# Patient Record
Sex: Female | Born: 1955 | Race: Black or African American | Hispanic: No | Marital: Single | Smoking: Current every day smoker
Health system: Southern US, Community
[De-identification: ages and names within clinical notes are randomized; demographics above are authoritative.]

## PROBLEM LIST (undated history)

## (undated) DIAGNOSIS — E119 Type 2 diabetes mellitus without complications: Secondary | ICD-10-CM

## (undated) DIAGNOSIS — I2510 Atherosclerotic heart disease of native coronary artery without angina pectoris: Secondary | ICD-10-CM

## (undated) DIAGNOSIS — I10 Essential (primary) hypertension: Secondary | ICD-10-CM

## (undated) DIAGNOSIS — E785 Hyperlipidemia, unspecified: Secondary | ICD-10-CM

## (undated) DIAGNOSIS — F319 Bipolar disorder, unspecified: Secondary | ICD-10-CM

## (undated) HISTORY — PX: COLONOSCOPY: SHX174

## (undated) HISTORY — PX: ESOPHAGOGASTRODUODENOSCOPY: SHX1529

---

## 2016-01-22 DIAGNOSIS — J449 Chronic obstructive pulmonary disease, unspecified: Secondary | ICD-10-CM | POA: Insufficient documentation

## 2016-04-22 DIAGNOSIS — I252 Old myocardial infarction: Secondary | ICD-10-CM | POA: Insufficient documentation

## 2016-04-22 DIAGNOSIS — F319 Bipolar disorder, unspecified: Secondary | ICD-10-CM | POA: Insufficient documentation

## 2016-04-22 DIAGNOSIS — I214 Non-ST elevation (NSTEMI) myocardial infarction: Secondary | ICD-10-CM | POA: Insufficient documentation

## 2018-02-17 DIAGNOSIS — F1411 Cocaine abuse, in remission: Secondary | ICD-10-CM | POA: Insufficient documentation

## 2020-02-25 ENCOUNTER — Other Ambulatory Visit: Payer: Self-pay

## 2020-02-25 ENCOUNTER — Emergency Department: Payer: 59

## 2020-02-25 ENCOUNTER — Inpatient Hospital Stay
Admission: EM | Admit: 2020-02-25 | Discharge: 2020-02-27 | DRG: 638 | Disposition: A | Discharge status: Home / Self Care | Payer: 59 | Attending: Obstetrics and Gynecology | Admitting: Obstetrics and Gynecology

## 2020-02-25 DIAGNOSIS — F319 Bipolar disorder, unspecified: Secondary | ICD-10-CM | POA: Diagnosis present

## 2020-02-25 DIAGNOSIS — Z794 Long term (current) use of insulin: Secondary | ICD-10-CM

## 2020-02-25 DIAGNOSIS — E43 Unspecified severe protein-calorie malnutrition: Secondary | ICD-10-CM | POA: Diagnosis present

## 2020-02-25 DIAGNOSIS — E782 Mixed hyperlipidemia: Secondary | ICD-10-CM

## 2020-02-25 DIAGNOSIS — E440 Moderate protein-calorie malnutrition: Secondary | ICD-10-CM | POA: Diagnosis not present

## 2020-02-25 DIAGNOSIS — Z66 Do not resuscitate: Secondary | ICD-10-CM | POA: Diagnosis present

## 2020-02-25 DIAGNOSIS — E1010 Type 1 diabetes mellitus with ketoacidosis without coma: Principal | ICD-10-CM | POA: Diagnosis present

## 2020-02-25 DIAGNOSIS — N179 Acute kidney failure, unspecified: Secondary | ICD-10-CM | POA: Diagnosis present

## 2020-02-25 DIAGNOSIS — M7989 Other specified soft tissue disorders: Secondary | ICD-10-CM | POA: Diagnosis present

## 2020-02-25 DIAGNOSIS — E1110 Type 2 diabetes mellitus with ketoacidosis without coma: Secondary | ICD-10-CM | POA: Diagnosis present

## 2020-02-25 DIAGNOSIS — F1490 Cocaine use, unspecified, uncomplicated: Secondary | ICD-10-CM | POA: Diagnosis present

## 2020-02-25 DIAGNOSIS — Z20822 Contact with and (suspected) exposure to covid-19: Secondary | ICD-10-CM | POA: Diagnosis present

## 2020-02-25 DIAGNOSIS — Z9114 Patient's other noncompliance with medication regimen: Secondary | ICD-10-CM

## 2020-02-25 DIAGNOSIS — K219 Gastro-esophageal reflux disease without esophagitis: Secondary | ICD-10-CM | POA: Diagnosis present

## 2020-02-25 DIAGNOSIS — E785 Hyperlipidemia, unspecified: Secondary | ICD-10-CM | POA: Diagnosis present

## 2020-02-25 DIAGNOSIS — E46 Unspecified protein-calorie malnutrition: Secondary | ICD-10-CM | POA: Diagnosis present

## 2020-02-25 DIAGNOSIS — I10 Essential (primary) hypertension: Secondary | ICD-10-CM | POA: Diagnosis present

## 2020-02-25 DIAGNOSIS — E0810 Diabetes mellitus due to underlying condition with ketoacidosis without coma: Secondary | ICD-10-CM

## 2020-02-25 DIAGNOSIS — F17210 Nicotine dependence, cigarettes, uncomplicated: Secondary | ICD-10-CM | POA: Diagnosis present

## 2020-02-25 DIAGNOSIS — E860 Dehydration: Secondary | ICD-10-CM | POA: Diagnosis present

## 2020-02-25 HISTORY — DX: Type 2 diabetes mellitus without complications: E11.9

## 2020-02-25 LAB — BASIC METABOLIC PANEL
Anion gap: 32 — ABNORMAL HIGH (ref 5–15)
Anion gap: 14 (ref 5–15)
BUN: 62 mg/dL — ABNORMAL HIGH (ref 8–23)
BUN: 60 mg/dL — ABNORMAL HIGH (ref 8–23)
CO2: 20 mmol/L — ABNORMAL LOW (ref 22–32)
CO2: 29 mmol/L (ref 22–32)
Calcium: 9.9 mg/dL (ref 8.9–10.3)
Calcium: 9.2 mg/dL (ref 8.9–10.3)
Chloride: 82 mmol/L — ABNORMAL LOW (ref 98–111)
Chloride: 91 mmol/L — ABNORMAL LOW (ref 98–111)
Creatinine, Ser: 2.02 mg/dL — ABNORMAL HIGH (ref 0.44–1.00)
Creatinine, Ser: 1.52 mg/dL — ABNORMAL HIGH (ref 0.44–1.00)
GFR, Estimated: 27 mL/min — ABNORMAL LOW (ref >60)
GFR, Estimated: 38 mL/min — ABNORMAL LOW (ref >60)
Glucose, Bld: 577 mg/dL (ref 70–99)
Glucose, Bld: 408 mg/dL — ABNORMAL HIGH (ref 70–99)
Potassium: 4.5 mmol/L (ref 3.5–5.1)
Potassium: 4.5 mmol/L (ref 3.5–5.1)
Sodium: 134 mmol/L — ABNORMAL LOW (ref 135–145)
Sodium: 134 mmol/L — ABNORMAL LOW (ref 135–145)

## 2020-02-25 LAB — CBG MONITORING, ED
Glucose-Capillary: 558 mg/dL (ref 70–99)
Glucose-Capillary: 286 mg/dL — ABNORMAL HIGH (ref 70–99)

## 2020-02-25 LAB — CBC
HCT: 37 % (ref 36.0–46.0)
Hemoglobin: 12.9 g/dL (ref 12.0–15.0)
MCH: 32.6 pg (ref 26.0–34.0)
MCHC: 34.9 g/dL (ref 30.0–36.0)
MCV: 93.4 fL (ref 80.0–100.0)
Platelets: 296 10*3/uL (ref 150–400)
RBC: 3.96 MIL/uL (ref 3.87–5.11)
RDW: 12.6 % (ref 11.5–15.5)
WBC: 6.4 10*3/uL (ref 4.0–10.5)
nRBC: 0 % (ref 0.0–0.2)

## 2020-02-25 LAB — BLOOD GAS, VENOUS
Acid-Base Excess: 8.3 mmol/L — ABNORMAL HIGH (ref 0.0–2.0)
Bicarbonate: 33.4 mmol/L — ABNORMAL HIGH (ref 20.0–28.0)
O2 Saturation: 96.7 %
Patient temperature: 37
pCO2, Ven: 47 mmHg (ref 44.0–60.0)
pH, Ven: 7.46 — ABNORMAL HIGH (ref 7.250–7.430)
pO2, Ven: 83 mmHg — ABNORMAL HIGH (ref 32.0–45.0)

## 2020-02-25 LAB — TROPONIN I (HIGH SENSITIVITY): Troponin I (High Sensitivity): 33 ng/L — ABNORMAL HIGH (ref <18)

## 2020-02-25 LAB — SARS CORONAVIRUS 2 BY RT PCR (HOSPITAL ORDER, PERFORMED IN ~~LOC~~ HOSPITAL LAB): SARS Coronavirus 2: NEGATIVE

## 2020-02-25 LAB — BETA-HYDROXYBUTYRIC ACID: Beta-Hydroxybutyric Acid: 7.96 mmol/L — ABNORMAL HIGH (ref 0.05–0.27)

## 2020-02-25 MED ORDER — LACTATED RINGERS IV BOLUS: 20.0000 mL/kg | Freq: Once | INTRAVENOUS | Status: DC

## 2020-02-25 MED ORDER — POTASSIUM CHLORIDE 10 MEQ/100ML IV SOLN
10.0000 meq | INTRAVENOUS | Status: AC
  Administered 2020-02-25: 10 meq via INTRAVENOUS
  Filled 2020-02-25: qty 100

## 2020-02-25 MED ORDER — ACETAMINOPHEN 325 MG PO TABS
325.0000 mg | ORAL_TABLET | Freq: Four times a day (QID) | ORAL | Status: DC | PRN
Start: 2020-02-25 — End: 2020-02-27

## 2020-02-25 MED ORDER — DEXTROSE IN LACTATED RINGERS 5 % IV SOLN: INTRAVENOUS | Status: DC

## 2020-02-25 MED ORDER — LACTATED RINGERS IV BOLUS
1000.0000 mL | Freq: Once | INTRAVENOUS | Status: AC
  Administered 2020-02-25: 1000 mL via INTRAVENOUS

## 2020-02-25 MED ORDER — HYDRALAZINE HCL 50 MG PO TABS
25.0000 mg | ORAL_TABLET | Freq: Three times a day (TID) | ORAL | Status: DC
  Administered 2020-02-25 – 2020-02-26 (×3): 25 mg via ORAL
  Filled 2020-02-25 (×2): qty 1

## 2020-02-25 MED ORDER — INSULIN ASPART 100 UNIT/ML ~~LOC~~ SOLN: 4.0000 [IU] | Freq: Once | SUBCUTANEOUS | Status: DC

## 2020-02-25 MED ORDER — LACTATED RINGERS IV SOLN: INTRAVENOUS | Status: DC

## 2020-02-25 MED ORDER — ONDANSETRON HCL 4 MG/2ML IJ SOLN
4.0000 mg | Freq: Three times a day (TID) | INTRAMUSCULAR | Status: DC | PRN
Start: 2020-02-25 — End: 2020-02-27

## 2020-02-25 MED ORDER — SODIUM CHLORIDE 0.9 % IV SOLN: Freq: Once | INTRAVENOUS | Status: AC

## 2020-02-25 MED ORDER — HEPARIN SODIUM (PORCINE) 5000 UNIT/ML IJ SOLN
5000.0000 [IU] | Freq: Three times a day (TID) | INTRAMUSCULAR | Status: DC
  Administered 2020-02-25 – 2020-02-27 (×4): 5000 [IU] via SUBCUTANEOUS
  Filled 2020-02-25 (×4): qty 1

## 2020-02-25 MED ORDER — INSULIN REGULAR(HUMAN) IN NACL 100-0.9 UT/100ML-% IV SOLN
INTRAVENOUS | Status: DC
  Administered 2020-02-25: 12 [IU]/h via INTRAVENOUS
  Filled 2020-02-25: qty 100

## 2020-02-25 MED ORDER — METOPROLOL TARTRATE 5 MG/5ML IV SOLN: 5.0000 mg | INTRAVENOUS | Status: DC | PRN

## 2020-02-25 MED ORDER — DEXTROSE 50 % IV SOLN
0.0000 mL | INTRAVENOUS | Status: DC | PRN
Start: 2020-02-25 — End: 2020-02-27

## 2020-02-25 NOTE — ED Provider Notes (Incomplete)
   [facility] Emergency Department Provider Note  ____________________________________________   Event Date/Time   First MD Initiated Contact with Patient 02/25/20 1546     (approximate)  I have reviewed the triage vital signs and the nursing notes.   HISTORY  Chief Complaint Hyperglycemia   HPI Erica Mercer is a 65 y.o. female ***         Past Medical History:  Diagnosis Date  . Diabetes mellitus without complication ([place])     There are no problems to display for this patient.   History reviewed. No pertinent surgical history.  Prior to Admission medications   Not on File    Allergies Patient has no allergy information on record.  No family history on file.  Social History    Review of Systems  ROS    ____________________________________________   PHYSICAL EXAM:  VITAL SIGNS: ED Triage Vitals  Enc Vitals Group     BP 02/25/20 1243 105/68     Pulse Rate 02/25/20 1243 (!) 105     Resp 02/25/20 1243 18     Temp 02/25/20 1243 97.8 F (36.6 C)     Temp src --      SpO2 02/25/20 1243 100 %     Weight --      Height --      Head Circumference --      Peak Flow --      Pain Score 02/25/20 1241 6     Pain Loc --      Pain Edu? --      Excl. in [place]? --    Vitals:   02/25/20 1243  BP: 105/68  Pulse: (!) 105  Resp: 18  Temp: 97.8 F (36.6 C)  SpO2: 100%   Physical Exam   ____________________________________________   LABS (all labs ordered are listed, but only abnormal results are displayed)  Labs Reviewed  BASIC METABOLIC PANEL - Abnormal; Notable for the following components:      Result Value   Sodium 134 (*)    Chloride 82 (*)    CO2 20 (*)    Glucose, Bld 577 (*)    BUN 62 (*)    Creatinine, Ser 2.02 (*)    GFR, Estimated 27 (*)    Anion gap 32 (*)    All other components within normal limits  BETA-HYDROXYBUTYRIC ACID - Abnormal; Notable for the following components:   Beta-Hydroxybutyric Acid  7.96 (*)    All other components within normal limits  CBG MONITORING, ED - Abnormal; Notable for the following components:   Glucose-Capillary 558 (*)    All other components within normal limits  CBC  URINALYSIS, COMPLETE (UACMP) WITH MICROSCOPIC  CBG MONITORING, ED   ____________________________________________  EKG  *** ____________________________________________  RADIOLOGY  ED MD interpretation:  ***  Official radiology report(s): No results found.  ____________________________________________   PROCEDURES  Procedure(s) performed (including Critical Care):  Procedures   ____________________________________________   INITIAL IMPRESSION / ASSESSMENT AND PLAN / ED COURSE        ***          ____________________________________________   FINAL CLINICAL IMPRESSION(S) / ED DIAGNOSES  Final diagnoses:  None    Medications  0.9 %  sodium chloride infusion ( Intravenous Stopped 02/25/20 1510)     ED Discharge Orders    None       Note:  This document was prepared using Dragon voice recognition software and may include unintentional dictation errors.

## 2020-02-25 NOTE — H&P (Addendum)
History and Physical   Erica Mercer WCH:[id number] DOB: 03-Mar-1955 DOA: 02/25/2020  PCP: Patient, No Pcp Per  Patient coming from: home via EMS  I have personally briefly reviewed patient's old medical records in [place].  Chief Concern: elevated blood sugar  HPI: Erica Mercer is a 65 y.o. female with medical history significant for IDDM2, hypertension, gerd, hld, presents to the ED for chief concern of elevated blood sugar for three days.   She endorses N/V/abdominal pain and missed her insulin on 02/24/20. She reports taking her insulin prior to presentation on 02/25/20. She denies fever, chest pain, shortness of breath, dysuria, hematuria. She endorses throbbing abdominal pain, that is diffused. She denies diarrhea, sick contacts, cough, syncope.   She states she has lost about 100 pounds gradually over the last 10 years.  She reports that she has had colonoscopy and has been negative for cancer.  She denies personal history of cancer.  Social history: lives at home with daughter. She smokes 5-10 cigarettes per day. She denies etoh and recreational drug use.   ROS: Constitutional: + weight change, no fever ENT/Mouth: no sore throat, no rhinorrhea Eyes: no eye pain, no vision changes Cardiovascular: no chest pain, no dyspnea,  no edema, no palpitations Respiratory: no cough, no sputum, no wheezing Gastrointestinal: + nausea, + vomiting, no diarrhea, no constipation Genitourinary: no urinary incontinence, no dysuria, no hematuria Musculoskeletal: no arthralgias, no myalgias Skin: no skin lesions, no pruritus, Neuro: + weakness, no loss of consciousness, no syncope Psych: no anxiety, no depression, + decrease appetite Heme/Lymph: no bruising, no bleeding  ED Course: Discussed with ED provider, patient requiring admission for DKA.   Assessment/Plan  Active Problems:   DKA (diabetic ketoacidosis) ([place])   Malnutrition ([place])   Essential hypertension   HLD  (hyperlipidemia)   Soft tissue mass   DKA secondary to poor insulin compliance Metabolic acidosis with anion gap -Checking troponin and EKG and Covid test -Pending chest x-ray per ED provider -Status post 4 units aspart per ED provider, LR 1 L bolus -Admit to stepdown with telemetry -DKA protocol initiated with Endo tool, lactated Ringer bolus -Potassium chloride 10 mill equivalent IVP for 2 doses -Beta hydroxybutyrate now and then every 8 hours -CMP every 4 hours  High blood pressure-patient states that she does not know her antihypertensives -Hydralazine 25 mg every 8 hours for 2 days -Pharmacy tech to complete medical reconciliation -Metoprolol 5 mg every 1 hours as needed for SBP  Protein malnutrition-dietary has been consulted for supplement recommendations  Calcified soft tissue mass in the right flank/right breast-patient will need to follow-up with this outpatient  As needed medications: Ondansetron, acetaminophen, metoprolol  Chart reviewed.   DVT prophylaxis: Heparin Code Status: DNR Diet: N.p.o. except for sips with meds and ice chips Family Communication: No Disposition Plan: Pending clinical course Consults called: None at this time Admission status: Observation to stepdown  Past Medical History:  Diagnosis Date  . Diabetes mellitus without complication ([place])    History reviewed. No pertinent surgical history.  Social History:  has no history on file for tobacco use, alcohol use, and drug use.  Not on File No family history on file. Family history: Family history reviewed and not pertinent  Prior to Admission medications   Not on File   Physical Exam: Vitals:   02/25/20 1831 02/25/20 1859 02/25/20 2015 02/25/20 2031  BP: (!) 153/64  136/88   Pulse: 90  83   Resp:  16 (!) 29  Temp: 98.7 F (37.1 C)     TempSrc: Oral Oral    SpO2: 97%  95%   Weight:    43.5 kg  Height:    5\' 3"  (1.6 m)   Constitutional: appears older than chronological age,  frail, cachectic, NAD, calm, comfortable Eyes: PERRL, lids and conjunctivae normal ENMT: Mucous membranes are moist. Posterior pharynx clear of any exudate or lesions. Age-appropriate dentition. Hearing appropriate Neck: normal, supple, no masses, no thyromegaly Respiratory: clear to auscultation bilaterally, no wheezing, no crackles. Normal respiratory effort. No accessory muscle use.  Cardiovascular: Regular rate and rhythm, no murmurs / rubs / gallops. No extremity edema. 2+ pedal pulses. No carotid bruits.  Abdomen: mild tenderness, no masses palpated, no hepatosplenomegaly. Bowel sounds positive.  Musculoskeletal: no clubbing / cyanosis. No joint deformity upper and lower extremities. Good ROM, no contractures, no atrophy. Normal muscle tone.  Skin: no rashes, lesions, ulcers. No induration Neurologic: Sensation intact. Strength 5/5 in all 4.  Psychiatric: Normal judgment and insight. Alert and oriented x 3. Normal mood.   EKG: ordered and pending. I have communicated with RN that this needs to be done and uploaded into chart.  Chest x-ray on Admission: I personally reviewed and I agree with radiologist reading as below.  Labs on Admission: I have personally reviewed following labs  CBC: Recent Labs  Lab 02/25/20 1245  WBC 6.4  HGB 12.9  HCT 37.0  MCV 93.4  PLT 353   Basic Metabolic Panel: Recent Labs  Lab 02/25/20 1245 02/25/20 1635  NA 134* 134*  K 4.5 4.5  CL 82* 91*  CO2 20* 29  GLUCOSE 577* 408*  BUN 62* 60*  CREATININE 2.02* 1.52*  CALCIUM 9.9 9.2   CBG: Recent Labs  Lab 02/25/20 1242 02/25/20 2036  GLUCAP 558* 286*   CRITICAL CARE Performed by: Briant Cedar Berna Gitto  Total critical care time: 35 minutes  Critical care time was exclusive of separately billable procedures and treating other patients.  Critical care was necessary to treat or prevent imminent or life-threatening deterioration. Endocrine emergency/DKA  Critical care was time spent personally by me  on the following activities: development of treatment plan with patient and/or surrogate as well as nursing, discussions with consultants, evaluation of patient's response to treatment, examination of patient, obtaining history from patient or surrogate, ordering and performing treatments and interventions, ordering and review of laboratory studies, ordering and review of radiographic studies, pulse oximetry and re-evaluation of patient's condition.  Labrandon Knoch N Barton Want D.O. [facility]  If 7PM-7AM, please contact overnight-coverage provider If 7AM-7PM, please contact day coverage provider [email or web address]  02/25/2020, 10:53 PM

## 2020-02-25 NOTE — ED Triage Notes (Signed)
Pt comes via EMS from home with c/o high CBG and N/V for 3 days. EMS reported CBG-[id number]

## 2020-02-25 NOTE — ED Triage Notes (Signed)
Pt comes into the ED via EMS from home with c/o CBG high for the past 3 days with N/V.Marland Kitchen CBG 499

## 2020-02-25 NOTE — ED Provider Notes (Addendum)
[facility] Emergency Department Provider Note   ____________________________________________   Event Date/Time   First MD Initiated Contact with Patient 02/25/20 1551     (approximate)  I have reviewed the triage vital signs and the nursing notes.   HISTORY  Chief Complaint Hyperglycemia    HPI Erica Mercer is a 65 y.o. female with nausea and vomiting for 2 days now her sugar is up.  She smells ketotic and does not feel good.  Blood sugars have been around 500 or high.  Here blood sugars 499.  She is afebrile initially was somewhat tachycardic.  Blood pressure came up and heart rate went down with some fluids.  Patient however still smelling ketotic.  She is not vomiting anymore.  She is not coughing.         Past Medical History:  Diagnosis Date  . Diabetes mellitus without complication ([place])     There are no problems to display for this patient.   History reviewed. No pertinent surgical history.  Prior to Admission medications   Not on File    Allergies Patient has no allergy information on record.  No family history on file.  Social History    Review of Systems  Constitutional: No fever/chills Eyes: No visual changes. ENT: No sore throat. Cardiovascular: Denies chest pain. Respiratory: Denies shortness of breath. Gastrointestinal: Mild abdominal pain.  Currently no nausea, no vomiting.  No diarrhea.  No constipation. Genitourinary: Negative for dysuria. Musculoskeletal: Negative for back pain. Skin: Negative for rash. Neurological: Negative for headaches, focal weakness   ____________________________________________   PHYSICAL EXAM:  VITAL SIGNS: ED Triage Vitals  Enc Vitals Group     BP 02/25/20 1243 105/68     Pulse Rate 02/25/20 1243 (!) 105     Resp 02/25/20 1243 18     Temp 02/25/20 1243 97.8 F (36.6 C)     Temp src --      SpO2 02/25/20 1243 100 %     Weight --      Height --      Head Circumference  --      Peak Flow --      Pain Score 02/25/20 1241 6     Pain Loc --      Pain Edu? --      Excl. in [place]? --     Constitutional: Alert and oriented.  He is not feeling well. Eyes: Conjunctivae are normal.  Head: Atraumatic. Nose: No congestion/rhinnorhea. Mouth/Throat: Mucous membranes are moist.  Oropharynx non-erythematous. Neck: No stridor.  Cardiovascular: Normal rate, regular rhythm. Grossly normal heart sounds.  Good peripheral circulation. Respiratory: Normal respiratory effort.  No retractions. Lungs CTAB. Gastrointestinal: Soft and nontender. No distention. No abdominal bruits.  Musculoskeletal: No lower extremity tenderness nor edema.   Neurologic:  Normal speech and language. No gross focal neurologic deficits are appreciated. . Skin:  Skin is warm, dry and intact. No rash noted.   ____________________________________________   LABS (all labs ordered are listed, but only abnormal results are displayed)  Labs Reviewed  BASIC METABOLIC PANEL - Abnormal; Notable for the following components:      Result Value   Sodium 134 (*)    Chloride 82 (*)    CO2 20 (*)    Glucose, Bld 577 (*)    BUN 62 (*)    Creatinine, Ser 2.02 (*)    GFR, Estimated 27 (*)    Anion gap 32 (*)    All other components  within normal limits  BETA-HYDROXYBUTYRIC ACID - Abnormal; Notable for the following components:   Beta-Hydroxybutyric Acid 7.96 (*)    All other components within normal limits  CBG MONITORING, ED - Abnormal; Notable for the following components:   Glucose-Capillary 558 (*)    All other components within normal limits  SARS CORONAVIRUS 2 BY RT PCR (HOSPITAL ORDER, [facility])  SARS CORONAVIRUS 2 (TAT 6-24 HRS)  CBC  URINALYSIS, COMPLETE (UACMP) WITH MICROSCOPIC  BLOOD GAS, VENOUS  BASIC METABOLIC PANEL  URINALYSIS, COMPLETE (UACMP) WITH MICROSCOPIC  CBG MONITORING, ED  CBG MONITORING, ED  CBG MONITORING, ED  TROPONIN I (HIGH SENSITIVITY)    ____________________________________________  EKG  ______ no EKG available at present ______________________________________  RADIOLOGY Gertha Calkin, personally viewed and evaluated these images (plain radiographs) as part of my medical decision making, as well as reviewing the written report by the radiologist.  ED MD interpretation:   Official radiology report(s): No results found.  ____________________________________________   PROCEDURES  Procedure(s) performed (including Critical Care):critical time 15 minutes. This includes evaluating the patient, beginning treatment nad looking for her old records. I also spoke with the [email or web address].  Procedures   ____________________________________________   INITIAL IMPRESSION / ASSESSMENT AND PLAN / ED COURSE Patient with DKA unsure of why.  We will give her fluids and insulin.  Will begin glucose stabilizer once we get a newer CBG and BMP.  Patient is going for chest x-ray patient has not been able to produce urine at this time.              ____________________________________________   FINAL CLINICAL IMPRESSION(S) / ED DIAGNOSES  Final diagnoses:  Diabetic ketoacidosis without coma associated with diabetes mellitus due to underlying condition [facility])     ED Discharge Orders    None      *Please note:  Erica Mercer was evaluated in Emergency Department on 02/25/2020 for the symptoms described in the history of present illness. She was evaluated in the context of the global COVID-19 pandemic, which necessitated consideration that the patient might be at risk for infection with the SARS-CoV-2 virus that causes COVID-19. Institutional protocols and algorithms that pertain to the evaluation of patients at risk for COVID-19 are in a state of rapid change based on information released by regulatory bodies including the CDC and federal and state organizations. These policies and algorithms were followed during the  patient's care in the ED.  Some ED evaluations and interventions may be delayed as a result of limited staffing during and the pandemic.*   Note:  This document was prepared using Dragon voice recognition software and may include unintentional dictation errors.    Nena Polio, MD 02/25/20 1636    Nena Polio, MD 02/25/20 [phone]

## 2020-02-26 ENCOUNTER — Encounter: Payer: Self-pay | Admitting: Internal Medicine

## 2020-02-26 DIAGNOSIS — E782 Mixed hyperlipidemia: Secondary | ICD-10-CM | POA: Diagnosis not present

## 2020-02-26 DIAGNOSIS — E785 Hyperlipidemia, unspecified: Secondary | ICD-10-CM | POA: Diagnosis present

## 2020-02-26 DIAGNOSIS — Z66 Do not resuscitate: Secondary | ICD-10-CM | POA: Diagnosis present

## 2020-02-26 DIAGNOSIS — F17210 Nicotine dependence, cigarettes, uncomplicated: Secondary | ICD-10-CM | POA: Diagnosis present

## 2020-02-26 DIAGNOSIS — Z794 Long term (current) use of insulin: Secondary | ICD-10-CM | POA: Diagnosis not present

## 2020-02-26 DIAGNOSIS — E46 Unspecified protein-calorie malnutrition: Secondary | ICD-10-CM | POA: Diagnosis present

## 2020-02-26 DIAGNOSIS — Z9114 Patient's other noncompliance with medication regimen: Secondary | ICD-10-CM | POA: Diagnosis not present

## 2020-02-26 DIAGNOSIS — N189 Chronic kidney disease, unspecified: Secondary | ICD-10-CM | POA: Insufficient documentation

## 2020-02-26 DIAGNOSIS — E0810 Diabetes mellitus due to underlying condition with ketoacidosis without coma: Secondary | ICD-10-CM | POA: Diagnosis not present

## 2020-02-26 DIAGNOSIS — E1010 Type 1 diabetes mellitus with ketoacidosis without coma: Secondary | ICD-10-CM | POA: Diagnosis present

## 2020-02-26 DIAGNOSIS — Z20822 Contact with and (suspected) exposure to covid-19: Secondary | ICD-10-CM | POA: Diagnosis present

## 2020-02-26 DIAGNOSIS — E860 Dehydration: Secondary | ICD-10-CM | POA: Diagnosis present

## 2020-02-26 DIAGNOSIS — N1831 Chronic kidney disease, stage 3a: Secondary | ICD-10-CM | POA: Insufficient documentation

## 2020-02-26 DIAGNOSIS — K219 Gastro-esophageal reflux disease without esophagitis: Secondary | ICD-10-CM | POA: Diagnosis present

## 2020-02-26 DIAGNOSIS — E1110 Type 2 diabetes mellitus with ketoacidosis without coma: Secondary | ICD-10-CM | POA: Diagnosis present

## 2020-02-26 DIAGNOSIS — N179 Acute kidney failure, unspecified: Secondary | ICD-10-CM | POA: Diagnosis present

## 2020-02-26 DIAGNOSIS — I10 Essential (primary) hypertension: Secondary | ICD-10-CM | POA: Diagnosis present

## 2020-02-26 DIAGNOSIS — F319 Bipolar disorder, unspecified: Secondary | ICD-10-CM | POA: Diagnosis present

## 2020-02-26 DIAGNOSIS — E440 Moderate protein-calorie malnutrition: Secondary | ICD-10-CM | POA: Diagnosis not present

## 2020-02-26 DIAGNOSIS — F1490 Cocaine use, unspecified, uncomplicated: Secondary | ICD-10-CM | POA: Diagnosis present

## 2020-02-26 LAB — BASIC METABOLIC PANEL
Anion gap: 15 (ref 5–15)
Anion gap: 9 (ref 5–15)
Anion gap: 9 (ref 5–15)
Anion gap: 10 (ref 5–15)
BUN: 49 mg/dL — ABNORMAL HIGH (ref 8–23)
BUN: 39 mg/dL — ABNORMAL HIGH (ref 8–23)
BUN: 37 mg/dL — ABNORMAL HIGH (ref 8–23)
BUN: 40 mg/dL — ABNORMAL HIGH (ref 8–23)
CO2: 27 mmol/L (ref 22–32)
CO2: 30 mmol/L (ref 22–32)
CO2: 31 mmol/L (ref 22–32)
CO2: 29 mmol/L (ref 22–32)
Calcium: 9.4 mg/dL (ref 8.9–10.3)
Calcium: 9.1 mg/dL (ref 8.9–10.3)
Calcium: 9.2 mg/dL (ref 8.9–10.3)
Calcium: 8.7 mg/dL — ABNORMAL LOW (ref 8.9–10.3)
Chloride: 98 mmol/L (ref 98–111)
Chloride: 97 mmol/L — ABNORMAL LOW (ref 98–111)
Chloride: 96 mmol/L — ABNORMAL LOW (ref 98–111)
Chloride: 98 mmol/L (ref 98–111)
Creatinine, Ser: 1.18 mg/dL — ABNORMAL HIGH (ref 0.44–1.00)
Creatinine, Ser: 1.08 mg/dL — ABNORMAL HIGH (ref 0.44–1.00)
Creatinine, Ser: 1.25 mg/dL — ABNORMAL HIGH (ref 0.44–1.00)
Creatinine, Ser: 1.26 mg/dL — ABNORMAL HIGH (ref 0.44–1.00)
GFR, Estimated: 52 mL/min — ABNORMAL LOW (ref >60)
GFR, Estimated: 57 mL/min — ABNORMAL LOW (ref >60)
GFR, Estimated: 48 mL/min — ABNORMAL LOW (ref >60)
GFR, Estimated: 48 mL/min — ABNORMAL LOW (ref >60)
Glucose, Bld: 101 mg/dL — ABNORMAL HIGH (ref 70–99)
Glucose, Bld: 263 mg/dL — ABNORMAL HIGH (ref 70–99)
Glucose, Bld: 172 mg/dL — ABNORMAL HIGH (ref 70–99)
Glucose, Bld: 253 mg/dL — ABNORMAL HIGH (ref 70–99)
Potassium: 3.8 mmol/L (ref 3.5–5.1)
Potassium: 4.2 mmol/L (ref 3.5–5.1)
Potassium: 4.4 mmol/L (ref 3.5–5.1)
Potassium: 4.1 mmol/L (ref 3.5–5.1)
Sodium: 140 mmol/L (ref 135–145)
Sodium: 136 mmol/L (ref 135–145)
Sodium: 136 mmol/L (ref 135–145)
Sodium: 137 mmol/L (ref 135–145)

## 2020-02-26 LAB — URINALYSIS, COMPLETE (UACMP) WITH MICROSCOPIC
Bacteria, UA: NONE SEEN
Bilirubin Urine: NEGATIVE
Glucose, UA: >=500 mg/dL — AB
Hgb urine dipstick: NEGATIVE
Ketones, ur: 5 mg/dL — AB
Leukocytes,Ua: NEGATIVE
Nitrite: NEGATIVE
Protein, ur: >=300 mg/dL — AB
Specific Gravity, Urine: 1.017 (ref 1.005–1.030)
pH: 5 (ref 5.0–8.0)

## 2020-02-26 LAB — CBG MONITORING, ED
Glucose-Capillary: 211 mg/dL — ABNORMAL HIGH (ref 70–99)
Glucose-Capillary: <10 mg/dL — CL (ref 70–99)
Glucose-Capillary: 91 mg/dL (ref 70–99)
Glucose-Capillary: 149 mg/dL — ABNORMAL HIGH (ref 70–99)
Glucose-Capillary: 157 mg/dL — ABNORMAL HIGH (ref 70–99)
Glucose-Capillary: 320 mg/dL — ABNORMAL HIGH (ref 70–99)
Glucose-Capillary: 337 mg/dL — ABNORMAL HIGH (ref 70–99)

## 2020-02-26 LAB — HEMOGLOBIN A1C
Hgb A1c MFr Bld: 14.4 % — ABNORMAL HIGH (ref 4.8–5.6)
Mean Plasma Glucose: 366.58 mg/dL

## 2020-02-26 LAB — HIV ANTIBODY (ROUTINE TESTING W REFLEX): HIV Screen 4th Generation wRfx: NONREACTIVE

## 2020-02-26 LAB — GLUCOSE, CAPILLARY
Glucose-Capillary: 164 mg/dL — ABNORMAL HIGH (ref 70–99)
Glucose-Capillary: 232 mg/dL — ABNORMAL HIGH (ref 70–99)
Glucose-Capillary: 211 mg/dL — ABNORMAL HIGH (ref 70–99)

## 2020-02-26 LAB — BETA-HYDROXYBUTYRIC ACID
Beta-Hydroxybutyric Acid: 1.07 mmol/L — ABNORMAL HIGH (ref 0.05–0.27)
Beta-Hydroxybutyric Acid: 1.8 mmol/L — ABNORMAL HIGH (ref 0.05–0.27)
Beta-Hydroxybutyric Acid: 0.69 mmol/L — ABNORMAL HIGH (ref 0.05–0.27)

## 2020-02-26 LAB — TROPONIN I (HIGH SENSITIVITY): Troponin I (High Sensitivity): 26 ng/L — ABNORMAL HIGH (ref <18)

## 2020-02-26 LAB — MAGNESIUM: Magnesium: 2.4 mg/dL (ref 1.7–2.4)

## 2020-02-26 MED ORDER — INSULIN GLARGINE 100 UNIT/ML ~~LOC~~ SOLN
20.0000 [IU] | Freq: Every day | SUBCUTANEOUS | Status: DC
  Administered 2020-02-26 – 2020-02-27 (×2): 20 [IU] via SUBCUTANEOUS
  Filled 2020-02-26 (×3): qty 0.2

## 2020-02-26 MED ORDER — ATORVASTATIN CALCIUM 20 MG PO TABS
40.0000 mg | ORAL_TABLET | Freq: Every day | ORAL | Status: DC
  Administered 2020-02-26: 40 mg via ORAL
  Filled 2020-02-26: qty 2

## 2020-02-26 MED ORDER — LACTATED RINGERS IV BOLUS
1000.0000 mL | Freq: Once | INTRAVENOUS | Status: AC
  Administered 2020-02-26: 1000 mL via INTRAVENOUS

## 2020-02-26 MED ORDER — PREGABALIN 50 MG PO CAPS
50.0000 mg | ORAL_CAPSULE | Freq: Two times a day (BID) | ORAL | Status: DC
  Administered 2020-02-26 – 2020-02-27 (×3): 50 mg via ORAL
  Filled 2020-02-26: qty 1
  Filled 2020-02-26: qty 2
  Filled 2020-02-26: qty 1

## 2020-02-26 MED ORDER — AMLODIPINE BESYLATE 10 MG PO TABS
10.0000 mg | ORAL_TABLET | Freq: Every day | ORAL | Status: DC
  Administered 2020-02-26 – 2020-02-27 (×2): 10 mg via ORAL
  Filled 2020-02-26: qty 1
  Filled 2020-02-26: qty 2

## 2020-02-26 MED ORDER — INSULIN GLARGINE 100 UNIT/ML ~~LOC~~ SOLN
10.0000 [IU] | Freq: Every day | SUBCUTANEOUS | Status: DC
  Filled 2020-02-26: qty 0.1

## 2020-02-26 MED ORDER — DEXTROSE 50 % IV SOLN
1.0000 | Freq: Once | INTRAVENOUS | Status: AC
  Administered 2020-02-26: 50 mL via INTRAVENOUS

## 2020-02-26 MED ORDER — PANTOPRAZOLE SODIUM 40 MG PO TBEC
40.0000 mg | DELAYED_RELEASE_TABLET | Freq: Every day | ORAL | Status: DC
  Administered 2020-02-26 – 2020-02-27 (×2): 40 mg via ORAL
  Filled 2020-02-26 (×2): qty 1

## 2020-02-26 MED ORDER — LOSARTAN POTASSIUM 50 MG PO TABS
50.0000 mg | ORAL_TABLET | Freq: Every day | ORAL | Status: DC
  Administered 2020-02-26 – 2020-02-27 (×2): 50 mg via ORAL
  Filled 2020-02-26 (×2): qty 1

## 2020-02-26 MED ORDER — ALBUTEROL SULFATE HFA 108 (90 BASE) MCG/ACT IN AERS
2.0000 | INHALATION_SPRAY | Freq: Four times a day (QID) | RESPIRATORY_TRACT | Status: DC | PRN
  Filled 2020-02-26: qty 6.7

## 2020-02-26 MED ORDER — INSULIN ASPART 100 UNIT/ML ~~LOC~~ SOLN
0.0000 [IU] | SUBCUTANEOUS | Status: DC
  Administered 2020-02-26 (×2): 2 [IU] via SUBCUTANEOUS
  Administered 2020-02-26: 3 [IU] via SUBCUTANEOUS
  Administered 2020-02-26: 5 [IU] via SUBCUTANEOUS
  Administered 2020-02-26: 3 [IU] via SUBCUTANEOUS
  Administered 2020-02-26: 11 [IU] via SUBCUTANEOUS
  Filled 2020-02-26 (×6): qty 1

## 2020-02-26 NOTE — Progress Notes (Signed)
Inpatient Diabetes Program Recommendations  AACE/ADA: New Consensus Statement on Inpatient Glycemic Control (2015)  Target Ranges:  Prepandial:   less than 140 mg/dL      Peak postprandial:   less than 180 mg/dL (1-2 hours)      Critically ill patients:  140 - 180 mg/dL   Lab Results  Component Value Date   GLUCAP 157 (H) 02/26/2020   HGBA1C 14.4 (H) 02/25/2020    Review of Glycemic Control  Diabetes history: DM1 Outpatient Diabetes medications: Lantus 10 units qd + Humalog 6 units tid meal coverage Current orders for Inpatient glycemic control: Lantus 10 units + Novolog 0-15 units q 4 hrs.  Inpatient Diabetes Program Recommendations:    Noted patient had hypoglycemia requiring dextrose for CBG ?<10 @ 0110 am. Patient had CBG 558 on admission and started on IV insulin @ 2105.Noted patient had hypoglycemia requiring dextrose for next CBG ?<10 @ 0110 am.  Noted Lantus 10 units ordered. -Add Novolog 3 units tid meal coverage -Decrease Novolog correction to 0-9 units tid + hs 0-5 units Secure chat sent to Dr. Posey Pronto. Patient sees Dr. Norville Haggard and last office visit regarding diabetes 12/13/19. On 11/29/19 A1c was 13.6. Spoke with patient's daughter via phone. Daughter states her mom was not feeling well when she returned home after having to spend 2 nights away from home due to work and weather. Daughter is unsure if patient took her insulin during this time. Reviewed with daughter A1c results from November and current 14.4. Shared patient eats sugar in oatmeal although has splenda available and craves butter. Daughter had to stop buying tubs of butter due to patient eating 4 tubs per month. Requested patient to assist patient @ discharge to assure patient is taking basal insulin + meal coverage along with rotation of insulin injection sites. [place] or Tyler Aas may cover patient better if available to patient free of charge due to longer coverage.   Thank you, [name]. Kolin Erdahl, RN, MSN, CDE   Diabetes Coordinator Inpatient Glycemic Control Team Team Pager [phone] (8am-5pm) 02/26/2020 11:13 AM

## 2020-02-26 NOTE — ED Notes (Signed)
Pt given meal tray for lunch 

## 2020-02-26 NOTE — ED Notes (Signed)
Pt transported to 131 at this time

## 2020-02-26 NOTE — ED Notes (Signed)
Bladder scan shows >553mL in bladder at this time. Pt states she does not feel urge to urinate at this time but will attempt. Pt placed on bedpan at this time

## 2020-02-26 NOTE — Progress Notes (Addendum)
[facility] [place] NAME: Erica Mercer    MR#:  [id number]  DATE OF BIRTH:  1955/04/24  SUBJECTIVE:  patient came in with elevated sugar. She reportedly is doing well. Eating well. Off insulin drip. Complains of weakness. Lives with her daughter at home.  REVIEW OF SYSTEMS:   Review of Systems  Constitutional: Negative for chills, fever and weight loss.  HENT: Negative for ear discharge, ear pain and nosebleeds.   Eyes: Negative for blurred vision, pain and discharge.  Respiratory: Positive for cough. Negative for sputum production, shortness of breath, wheezing and stridor.   Cardiovascular: Negative for chest pain, palpitations, orthopnea and PND.  Gastrointestinal: Negative for abdominal pain, diarrhea, nausea and vomiting.  Genitourinary: Negative for frequency and urgency.  Musculoskeletal: Negative for back pain and joint pain.  Neurological: Positive for weakness. Negative for sensory change, speech change and focal weakness.  Psychiatric/Behavioral: Negative for depression and hallucinations. The patient is not nervous/anxious.    Tolerating Diet: yes Tolerating PT: pending  DRUG ALLERGIES:  Not on File  VITALS:  Blood pressure (!) 169/83, pulse 72, temperature 98.7 F (37.1 C), temperature source Oral, resp. rate 15, height 5\' 3"  (1.6 m), weight 43.5 kg, SpO2 98 %.  PHYSICAL EXAMINATION:   Physical Exam  GENERAL:  65 y.o.-year-old patient lying in the bed with no acute distress. Thin, frail HEENT: Head atraumatic, normocephalic. Oropharynx and nasopharynx clear. Acquired alopecia LUNGS: Normal breath sounds bilaterally, no wheezing, rales, scattered rhonchi. No use of accessory muscles of respiration.  CARDIOVASCULAR: S1, S2 normal. No murmurs, rubs, or gallops.  ABDOMEN: Soft, nontender, nondistended. Bowel sounds present. EXTREMITIES: No cyanosis, clubbing or edema b/l.    NEUROLOGIC:  No focal Motor or sensory  deficits b/l. Moves all extremities well PSYCHIATRIC:  patient is alert and oriented x 3.  SKIN: No obvious rash, lesion, or ulcer.   LABORATORY PANEL:  CBC Recent Labs  Lab 02/25/20 1245  WBC 6.4  HGB 12.9  HCT 37.0  PLT 296    Chemistries  Recent Labs  Lab 02/26/20 0326 02/26/20 1038  NA 140 136  K 3.8 4.2  CL 98 97*  CO2 27 30  GLUCOSE 101* 263*  BUN 49* 39*  CREATININE 1.18* 1.08*  CALCIUM 9.4 9.1  MG 2.4  --    Cardiac Enzymes No results for input(s): TROPONINI in the last 168 hours. RADIOLOGY:  DG Chest 2 View  Result Date: 02/25/2020 CLINICAL DATA:  DKA. EXAM: CHEST - 2 VIEW COMPARISON:  None. FINDINGS: The heart size is mildly enlarged. There is no pneumothorax or large pleural effusion. No focal infiltrate. Aortic calcifications are noted. There is no acute osseous abnormality. There is a calcified density projecting over the right chest wall which may be within the right breast tissue. There is calcific tendinopathy of both shoulders. IMPRESSION: Mild cardiomegaly without acute cardiopulmonary process. Calcified soft tissue mass in the right flank/right breast. Correlation with physical exam and history is recommended. Electronically Signed   By: Constance Holster M.D.   On: 02/25/2020 17:14   ASSESSMENT AND PLAN:  Erica Mercer is a 65 y.o. female with medical history significant for IDDM2, hypertension, gerd, hld, presents to the ED for chief concern of elevated blood sugar for three days.  She endorses N/V/abdominal pain and missed her insulin on 02/24/20. She reports taking her insulin prior to presentation on 02/25/20.   DKA in Type 1 DM secondary to poor insulin compliance Metabolic  acidosis with anion gap acute renal failure with dehydration --DKA protocol initiated with Endo tool, lactated Ringer bolus--d/ced now -- DKA resolved -- patient started back on Lantus 20 units and sliding scale -- she has labile diabetes -- patient follows with [facility] for  her diabetes management. -- She is advised to continue follow-up after discharge. -- Patient eating and drinking well. --- Came in with creatinine of 2.0--- IV fluids-- 1.0  Hypertension  -resume amlodipine and losartan.  Protein malnutrition-dietary has been consulted for supplement recommendations  Calcified soft tissue mass in the right flank/right breast-patient will need to follow-up with this outpatient  GERD cont PPI  Physical therapy to see patient. TOC for discharge planning. Patient lives with her daughter.  DVT prophylaxis: Heparin Code Status: DNR Diet:  tolerating diet well family Communication: left VM for dter Pearson Forster Disposition Plan: Pending clinical course Consults called: None at this time Admission status:    Level of care: Med-Surg Status is: Inpatient  Remains inpatient appropriate because:Inpatient level of care appropriate due to severity of illness   Dispo: The patient is from: Home              Anticipated d/c is to: Home              Anticipated d/c date is: 1 day              Patient currently is not medically stable to d/c.   Difficult to place patient No        TOTAL TIME TAKING CARE OF THIS PATIENT: 25 minutes.  >50% time spent on counselling and coordination of care  Note: This dictation was prepared with Dragon dictation along with smaller phrase technology. Any transcriptional errors that result from this process are unintentional.  Fritzi Mandes M.D    [facility]   CC: Primary care physician; Patient, No Pcp PerPatient ID: Erica Mercer, female   DOB: 01-31-1955, 65 y.o.   MRN: [id number]

## 2020-02-26 NOTE — ED Notes (Signed)
Lab contacted to obtain the ordered BMP and beta hydroxybutyric acid that was due at 0800, and 0848. Lab to collect necessary blood work.

## 2020-02-26 NOTE — Progress Notes (Signed)
Cross Cover Notifed by nursing patient had precipitous drop in blood glucose to less than 10 with insulin going.  Fluids and insulin stopped by nursing.  D5LR then ordered to resume while chem panel pending.  Chem panel shows anion gap remains at 15 but BUN/Creatinine continues to improve and glucose not high enough to resume insulin drip therapy. Patient started on sq insulin, fluid bolus given to replace fluid bolus and maintenance fluids not previously given. Nursing to inform provider if blood glucose goes above 250

## 2020-02-27 DIAGNOSIS — E1110 Type 2 diabetes mellitus with ketoacidosis without coma: Secondary | ICD-10-CM | POA: Diagnosis not present

## 2020-02-27 LAB — BASIC METABOLIC PANEL
Anion gap: 10 (ref 5–15)
Anion gap: 10 (ref 5–15)
BUN: 38 mg/dL — ABNORMAL HIGH (ref 8–23)
BUN: 36 mg/dL — ABNORMAL HIGH (ref 8–23)
CO2: 29 mmol/L (ref 22–32)
CO2: 28 mmol/L (ref 22–32)
Calcium: 8.7 mg/dL — ABNORMAL LOW (ref 8.9–10.3)
Calcium: 9 mg/dL (ref 8.9–10.3)
Chloride: 98 mmol/L (ref 98–111)
Chloride: 97 mmol/L — ABNORMAL LOW (ref 98–111)
Creatinine, Ser: 1.22 mg/dL — ABNORMAL HIGH (ref 0.44–1.00)
Creatinine, Ser: 1.27 mg/dL — ABNORMAL HIGH (ref 0.44–1.00)
GFR, Estimated: 50 mL/min — ABNORMAL LOW (ref >60)
GFR, Estimated: 47 mL/min — ABNORMAL LOW (ref >60)
Glucose, Bld: 76 mg/dL (ref 70–99)
Glucose, Bld: 247 mg/dL — ABNORMAL HIGH (ref 70–99)
Potassium: 3.6 mmol/L (ref 3.5–5.1)
Potassium: 4 mmol/L (ref 3.5–5.1)
Sodium: 137 mmol/L (ref 135–145)
Sodium: 135 mmol/L (ref 135–145)

## 2020-02-27 LAB — GLUCOSE, CAPILLARY
Glucose-Capillary: 144 mg/dL — ABNORMAL HIGH (ref 70–99)
Glucose-Capillary: 239 mg/dL — ABNORMAL HIGH (ref 70–99)
Glucose-Capillary: 135 mg/dL — ABNORMAL HIGH (ref 70–99)

## 2020-02-27 MED ORDER — LANTUS SOLOSTAR 100 UNIT/ML ~~LOC~~ SOPN: 25.0000 [IU] | PEN_INJECTOR | Freq: Every day | SUBCUTANEOUS | 11 refills | Status: DC

## 2020-02-27 MED ORDER — INSULIN ASPART 100 UNIT/ML ~~LOC~~ SOLN
0.0000 [IU] | Freq: Three times a day (TID) | SUBCUTANEOUS | Status: DC
  Administered 2020-02-27: 2 [IU] via SUBCUTANEOUS
  Administered 2020-02-27: 5 [IU] via SUBCUTANEOUS
  Filled 2020-02-27 (×2): qty 1

## 2020-02-27 NOTE — Progress Notes (Signed)
Patient requested to talk with CSW. Met with patient at bedside. Patient reported she has been living with her daughter but needs to find an apartment "that Medicaid will pay for." Discussed Medicaid not covering independent living. Patient requested list of ALFs in [place], [place]. Provided list to patient.  Oleh Genin, [place]

## 2020-02-27 NOTE — Discharge Summary (Signed)
Erica Mercer [id number] DOB: [birth year] DOA: 02/25/2020  PCP: Patient, No Pcp Per  Admit date: 02/25/2020 Discharge date: 02/27/2020  Time spent: 40 minutes  Recommendations for Outpatient Follow-up:  1. Close f/u with PCP at [facility] 2. [facility] f/u in March as scheduled 3. Will need outpt attention to calcified soft tissue mass seen right breast/flank    Discharge Diagnoses:  Active Problems:   DKA (diabetic ketoacidosis) ([place])   Malnutrition ([place])   Essential hypertension   HLD (hyperlipidemia)   Soft tissue mass   DKA, type 1 ([place])   Discharge Condition: good  Diet recommendation: carb modified  Filed Weights   02/25/20 1741 02/25/20 2031  Weight: 43.5 kg 43.5 kg    History of present illness:  Erica Mercer is a 65 y.o. female with medical history significant for IDDM2, hypertension, gerd, hld, presents to the ED for chief concern of elevated blood sugar for three days.   She endorses N/V/abdominal pain and missed her insulin on 02/24/20. She reports taking her insulin prior to presentation on 02/25/20. She denies fever, chest pain, shortness of breath, dysuria, hematuria. She endorses throbbing abdominal pain, that is diffused. She denies diarrhea, sick contacts, cough, syncope.   She states she has lost about 100 pounds gradually over the last 10 years.  She reports that she has had colonoscopy and has been negative for cancer.  She denies personal history of cancer.  Social history: lives at home with daughter. She smokes 5-10 cigarettes per day. She denies etoh and recreational drug use.    Hospital Course:   DKA in Type 2 DM secondary to poor insulin compliance Metabolic acidosis with anion gap acute renal failure with dehydration Secondary to poor compliance with home meds, a1c 14.4 --DKA protocol initiated with Endo tool, lactated Ringer bolus--d/ced now -- DKA resolved -- patient started back on Lantus 20 units  and sliding scale, will discharge on lantus 25 units daily and resume home 6 units with meals - will need close f/u with pcp and has been instructed to check and record fasting sugars and 1-hour post-prandial sugars every day -- patient follows with [facility] for her diabetes management and has f/u scheduled in march  Hypertension  - controlled on home amlodipine and losartan.  Calcified soft tissue mass in the right flank/right breast-patient will need to follow-up with this outpatient  GERD cont PPI  Bipolar History cocaine use Complicates ability to care for self  Procedures:  none   Consultations:  none  Discharge Exam: Vitals:   02/27/20 0815 02/27/20 1141  BP: (!) 151/88 137/81  Pulse: 74 73  Resp: 16 16  Temp: 97.7 F (36.5 C) 98.5 F (36.9 C)  SpO2: 100% 100%    GENERAL:  65 y.o.-year-old patient sitting up in the bed with no acute distress. Thin, frail HEENT: Head atraumatic, normocephalic. Oropharynx and nasopharynx clear. Acquired alopecia LUNGS: Normal breath sounds bilaterally, no wheezing, rales, scattered rhonchi. No use of accessory muscles of respiration.  CARDIOVASCULAR: S1, S2 normal. No murmurs, rubs, or gallops.  ABDOMEN: Soft, nontender, nondistended. Bowel sounds present.  EXTREMITIES: No cyanosis, clubbing or edema b/l.    NEUROLOGIC:  No focal Motor or sensory deficits b/l. Moves all extremities well PSYCHIATRIC:  patient is alert and oriented x 3.  SKIN: No obvious rash, lesion, or ulcer.   Discharge Instructions   Discharge Instructions    Diet - low sodium heart healthy   Complete by: As directed    Increase  activity slowly   Complete by: As directed      Allergies as of 02/27/2020   Not on File     Medication List    TAKE these medications   albuterol 108 (90 Base) MCG/ACT inhaler Commonly known as: VENTOLIN HFA Inhale 2 puffs into the lungs every 6 (six) hours as needed for wheezing.   amLODipine 10 MG tablet Commonly known  as: NORVASC Take 10 mg by mouth daily.   atorvastatin 40 MG tablet Commonly known as: LIPITOR Take 40 mg by mouth at bedtime.   insulin lispro 100 UNIT/ML KwikPen Commonly known as: HUMALOG Inject 6 Units into the skin 3 (three) times daily with meals.   Lantus SoloStar 100 UNIT/ML Solostar Pen Generic drug: insulin glargine Inject 25 Units into the skin daily. What changed:   how much to take  when to take this   losartan 50 MG tablet Commonly known as: COZAAR Take 50 mg by mouth daily.   omeprazole 40 MG capsule Commonly known as: PRILOSEC Take 40 mg by mouth daily.   pregabalin 50 MG capsule Commonly known as: LYRICA Take 50 mg by mouth 2 (two) times daily.      Not on File  Follow-up Information    Your primary care provider at [facility] clinic Follow up.   Why: call to schedule an appointment within 1 week       Your [facility] endocrinologist Follow up.   Why: as scheduled next month               The results of significant diagnostics from this hospitalization (including imaging, microbiology, ancillary and laboratory) are listed below for reference.    Significant Diagnostic Studies: DG Chest 2 View  Result Date: 02/25/2020 CLINICAL DATA:  DKA. EXAM: CHEST - 2 VIEW COMPARISON:  None. FINDINGS: The heart size is mildly enlarged. There is no pneumothorax or large pleural effusion. No focal infiltrate. Aortic calcifications are noted. There is no acute osseous abnormality. There is a calcified density projecting over the right chest wall which may be within the right breast tissue. There is calcific tendinopathy of both shoulders. IMPRESSION: Mild cardiomegaly without acute cardiopulmonary process. Calcified soft tissue mass in the right flank/right breast. Correlation with physical exam and history is recommended. Electronically Signed   By: Constance Holster M.D.   On: 02/25/2020 17:14    Microbiology: Recent Results (from the past 240 hour(s))  SARS  Coronavirus 2 by RT PCR (hospital order, performed in [facility] hospital lab) Nasopharyngeal Nasopharyngeal Swab     Status: None   Collection Time: 02/25/20  4:35 PM   Specimen: Nasopharyngeal Swab  Result Value Ref Range Status   SARS Coronavirus 2 NEGATIVE NEGATIVE Final    Comment: (NOTE) SARS-CoV-2 target nucleic acids are NOT DETECTED.  The SARS-CoV-2 RNA is generally detectable in upper and lower respiratory specimens during the acute phase of infection. The lowest concentration of SARS-CoV-2 viral copies this assay can detect is 250 copies / mL. A negative result does not preclude SARS-CoV-2 infection and should not be used as the sole basis for treatment or other patient management decisions.  A negative result may occur with improper specimen collection / handling, submission of specimen other than nasopharyngeal swab, presence of viral mutation(s) within the areas targeted by this assay, and inadequate number of viral copies (<250 copies / mL). A negative result must be combined with clinical observations, patient history, and epidemiological information.  Fact Sheet for Patients:   StrictlyIdeas.no  Fact Sheet for Healthcare Providers: BankingDealers.co.za  This test is not yet approved or  cleared by the [place] FDA and has been authorized for detection and/or diagnosis of SARS-CoV-2 by FDA under an Emergency Use Authorization (EUA).  This EUA will remain in effect (meaning this test can be used) for the duration of the COVID-19 declaration under Section 564(b)(1) of the Act, 21 U.S.C. section 360bbb-3(b)(1), unless the authorization is terminated or revoked sooner.  Performed at [facility], [address] [place], [place] [address]      Labs: Basic Metabolic Panel: Recent Labs  Lab 02/26/20 0326 02/26/20 1038 02/26/20 1550 02/26/20 2004 02/27/20 0142 02/27/20 0801  NA 140 136 136 137 137  135  K 3.8 4.2 4.4 4.1 3.6 4.0  CL 98 97* 96* 98 98 97*  CO2 27 30 31 29 29 28   GLUCOSE 101* 263* 172* 253* 76 247*  BUN 49* 39* 37* 40* 38* 36*  CREATININE 1.18* 1.08* 1.25* 1.26* 1.22* 1.27*  CALCIUM 9.4 9.1 9.2 8.7* 8.7* 9.0  MG 2.4  --   --   --   --   --    Liver Function Tests: No results for input(s): AST, ALT, ALKPHOS, BILITOT, PROT, ALBUMIN in the last 168 hours. No results for input(s): LIPASE, AMYLASE in the last 168 hours. No results for input(s): AMMONIA in the last 168 hours. CBC: Recent Labs  Lab 02/25/20 1245  WBC 6.4  HGB 12.9  HCT 37.0  MCV 93.4  PLT 296   Cardiac Enzymes: No results for input(s): CKTOTAL, CKMB, CKMBINDEX, TROPONINI in the last 168 hours. BNP: BNP (last 3 results) No results for input(s): BNP in the last 8760 hours.  ProBNP (last 3 results) No results for input(s): PROBNP in the last 8760 hours.  CBG: Recent Labs  Lab 02/26/20 1538 02/26/20 2011 02/26/20 2325 02/27/20 0418 02/27/20 0817  GLUCAP 164* 232* 211* 144* 239*       Signed:  Desma Maxim MD.  [facility] 02/27/2020, 12:43 PM

## 2020-02-27 NOTE — Discharge Instructions (Signed)
 Diabetic Ketoacidosis Diabetic ketoacidosis (DKA) is a serious complication of diabetes. This condition develops when there is not enough insulin in the body. Insulin is a hormone that regulates blood sugar (glucose) levels in the body. Normally, insulin allows glucose to enter the cells in the body. The cells break down glucose for energy. Without enough insulin, the body cannot break down glucose and breaks down fats instead. This leads to high blood glucose levels in the body. It also leads to the production of acids that are called ketones. Ketones are poisonous at high levels. If diabetic ketoacidosis is not treated, it can cause severe dehydration and can lead to a coma or death. What are the causes? This condition develops when a lack of insulin causes the body to break down fats instead of glucose. This may be triggered by:  Stress on the body. This stress can be brought on by an illness.  Infection.  Medicines that raise blood glucose levels.  Not taking or skipping doses of diabetes medicines.  New onset of type 1 diabetes mellitus.  Missing insulin on purpose or by accident.  Interruption of insulin through an insulin pump. This can happen if the cannula that connects you to the insulin pump gets dislodged or kinked. What are the signs or symptoms? Symptoms of this condition include:  Excessive thirst or dry mouth.  Excessive urination.  Abdominal pain.  Nausea or vomiting.  Vision changes.  Fruity or sweet-smelling breath.  Weight loss.  Irritability or confusion.  Rapid breathing.  High blood glucose.  High levels of ketones in the body. To know your ketone levels: ? Collect urine in a small cup. ? Dip a test strip in the urine. ? Wait for it to change color. ? Compare the test strip results to the chart on the container. How is this diagnosed? This condition is diagnosed based on your medical history, a physical exam, and blood tests. You may also  have a urine test to check for ketones. How is this treated? This condition may be treated with:  Fluid replacement. This may be done with IV fluids to correct dehydration.  Correcting high blood glucose with insulin. This may be given through the skin as injections or through an IV.  Electrolyte replacement. Electrolytes are minerals in your blood. Electrolytes such as potassium and sodium may be given in pill form or through an IV.  Antibiotic medicines. These may be prescribed if your condition was caused by an infection. Diabetic ketoacidosis is a serious medical condition. You may need emergency treatment in the hospital so that you can be monitored closely. Follow these instructions at home: Medicines  Take over-the-counter and prescription medicines only as told by your health care provider.  Continue to take insulin and other diabetes medicines as told by your health care provider.  If you were prescribed an antibiotic medicine, take it as told by your health care provider. Do not stop taking the antibiotic even if you start to feel better. Eating and drinking  Drink enough fluids to keep your urine pale yellow.  If you are able to eat, follow your usual diet and drink sugar-free liquids such as water, tea, and sugar-free soft drinks. You can also have sugar-free gelatin or ice pops.  If you are not able to eat, drink liquids that contain sugar in small amounts as you are able. Liquids include fruit juice, regular soft drinks, and sherbet.   Checking ketones and blood glucose  Check your urine for   ketones when you are ill and as told by your health care provider. ? If your blood glucose is 240 mg/dL (13.3 mmol/L) or higher, check your urine ketones every 4 hours. If you have moderate or large ketones, call your health care provider.  Check your blood glucose every day, and as often as told by your health care provider. ? If your blood glucose is high, drink plenty of fluids.  This helps to flush out ketones. ? If your blood glucose is above your target for 2 tests in a row, contact your health care provider.   General instructions  Carry a medical alert card or wear medical alert jewelry that shows that you have diabetes.  Exercise only as told by your health care provider. Do not exercise when your blood glucose is high and you have ketones in your urine.  If you get sick, call your health care provider and begin treatment quickly. Your body often needs extra insulin to fight an illness. Check your blood glucose every 4 hours when you are sick.  Keep all follow-up visits as told by your health care provider. This is important. Where to find more information  American Diabetes Association: diabetes.org Contact a health care provider if:  Your blood glucose level is higher than 240 mg/dL (13.3 mmol/L) for 2 days in a row.  You have moderate or large ketones in your urine.  You have a fever.  You cannot eat or drink without vomiting.  You have been vomiting for more than 2 hours.  You continue to have symptoms of diabetic ketoacidosis.  You develop new symptoms. Get help right away if:  Your blood glucose monitor reads high even when you are taking insulin.  You faint.  You have chest pain.  You have trouble breathing.  You have sudden trouble speaking or swallowing.  You have vomiting or diarrhea that gets worse after 3 hours.  You are unable to stay awake.  You have trouble thinking.  You are severely dehydrated. Symptoms of severe dehydration include: ? Extreme thirst. ? Dry mouth. ? Rapid breathing. These symptoms may represent a serious problem that is an emergency. Do not wait to see if the symptoms will go away. Get medical help right away. Call your local emergency services (911 in the [place] Do not drive yourself to the hospital. Summary  Diabetic ketoacidosis is a serious complication of diabetes. This condition develops when  there is not enough insulin in the body.  This condition is diagnosed based on your medical history, a physical exam, and blood tests. You may also have a urine test to check for ketones.  Diabetic ketoacidosis is a serious medical condition. You may need emergency treatment in the hospital to monitor your condition.  Contact your health care provider if your blood glucose is higher than 240 mg/dL for 2 days in a row or if you have moderate or large ketones in your urine. This information is not intended to replace advice given to you by your health care provider. Make sure you discuss any questions you have with your health care provider. Document Revised: 12/06/2018 Document Reviewed: 12/06/2018 Elsevier Patient Education  2021 Elsevier Inc.   =======  

## 2020-02-27 NOTE — Progress Notes (Signed)
Patient walking on unit. Gait is steady. She tried to walk off unit, RN reminded her that while a patient she is to remain on unit unless transported/escorted by staff. Stated mask requirements due to covid precautions.

## 2020-02-27 NOTE — Progress Notes (Addendum)
Inpatient Diabetes Program Recommendations  AACE/ADA: New Consensus Statement on Inpatient Glycemic Control (2015)  Target Ranges:  Prepandial:   less than 140 mg/dL      Peak postprandial:   less than 180 mg/dL (1-2 hours)      Critically ill patients:  140 - 180 mg/dL   Results for Erica Mercer, Erica Mercer (MRN [id number]) as of 02/27/2020 06:26  Ref. Range 02/25/2020 12:45  Glucose Latest Ref Range: 70 - 99 mg/dL [facility])   Results for [name], [name] (MRN [id number]) as of 02/27/2020 06:26  Ref. Range 02/26/2020 04:41 02/26/2020 07:02 02/26/2020 11:17 02/26/2020 12:01 02/26/2020 15:38 02/26/2020 20:11 02/26/2020 23:25  Glucose-Capillary Latest Ref Range: 70 - 99 mg/dL 149 (H)  2 units NOVOLOG  157 (H)  3 units NOVOLOG  320 (H)   337 (H)  11 units NOVOLOG  20 units LANTUS _0 :38pm 164 (H)  3 units NOVOLOG  232 (H)  5 units NOVOLOG  211 (H)  2 units NOVOLOG    Results for [name], [name] (MRN [id number]) as of 02/27/2020 09:13  Ref. Range 02/27/2020 08:17  Glucose-Capillary Latest Ref Range: 70 - 99 mg/dL 239 (H)  5 units NOVOLOG  20 units LANTUS   Results for [name], [name] (MRN [id number]) as of 02/27/2020 06:26  Ref. Range 02/25/2020 12:45  Hemoglobin A1C Latest Ref Range: 4.8 - 5.6 % 14.4 (H)  (366 mg/dl)    Admit with: DKA  History: Type 1 DM  Home DM Meds: Lantus 10 units Daily       Humalog 6 units TID  Current Orders: Lantus 20 units Daily      Novolog Moderate Correction Scale/ SSI (0-15 units) TID AC + HS     Looks like pt has an appointment with [facility] on 04/02/2020    MD- Note Lantus started yesterday at 12:30pm.  CBG 239 this AM.  Please consider:  1. Increase Lantus to 25 units Daily Since 20 unit dose already given this AM, please consider also ordering Lantus 5 units X 1 dose to be given this AM as well  2. Start Novolog Meal Coverage: Novolog 3 units TID with meals (50% home dose)    Addendum 11:10am--Met w pt at bedside this AM.  We  discussed her admission with DKA, home diabetes regimen, etc.  Pt told me she has her insulin at home and takes on a regular basis.  We reviewed her current A1c of 14.4% and pt was shocked to hear it was so high--Unsure pt checking her CBGs in a regular basis.  States she has CBG meter and meter supplies at home as well.  Pt told me she is looking for an ALF in [place] b/c she is currently living with daughter but daughter will be moving in the next 2 months and pt wants to live in [place].  Strongly encouraged pt to checks her CBGs at least TID before meals at home, take her insulin as prescribed, and fllow up with her ENDO appt in early March.   --Will follow patient during hospitalization--  Wyn Quaker RN, MSN, CDE Diabetes Coordinator Inpatient Glycemic Control Team Team Pager: [phone] (8a-5p)

## 2020-02-27 NOTE — Plan of Care (Signed)
  Problem: Education: Goal: Ability to describe self-care measures that may prevent or decrease complications (Diabetes Survival Skills Education) will improve Outcome: Adequate for Discharge Goal: Individualized Educational Video(s) Outcome: Adequate for Discharge   Problem: Health Behavior/Discharge Planning: Goal: Ability to identify and utilize available resources and services will improve Outcome: Adequate for Discharge Goal: Ability to manage health-related needs will improve Outcome: Adequate for Discharge   Problem: Metabolic: Goal: Ability to maintain appropriate glucose levels will improve Outcome: Adequate for Discharge   Problem: Nutritional: Goal: Maintenance of adequate nutrition will improve Outcome: Adequate for Discharge Goal: Maintenance of adequate weight for body size and type will improve Outcome: Adequate for Discharge   Problem: Respiratory: Goal: Will regain and/or maintain adequate ventilation Outcome: Adequate for Discharge   Problem: Urinary Elimination: Goal: Ability to achieve and maintain adequate renal perfusion and functioning will improve Outcome: Adequate for Discharge   Problem: Education: Goal: Knowledge of General Education information will improve Description: Including pain rating scale, medication(s)/side effects and non-pharmacologic comfort measures Outcome: Adequate for Discharge   Problem: Health Behavior/Discharge Planning: Goal: Ability to manage health-related needs will improve Outcome: Adequate for Discharge   Problem: Clinical Measurements: Goal: Ability to maintain clinical measurements within normal limits will improve Outcome: Adequate for Discharge Goal: Diagnostic test results will improve Outcome: Adequate for Discharge

## 2020-02-27 NOTE — Progress Notes (Signed)
Initial Nutrition Assessment  DOCUMENTATION CODES:   Underweight  INTERVENTION:  Recommend Glucerna Shake po TID, each supplement provides 220 kcal and 10 grams of protein. Patient now with discharge order so unable to order at this time.  Also discussed other ONS options patient can use at home during times where her appetite is decreased and she is not able to eat as well. Also provided patient with recipe for homemade ONS.  NUTRITION DIAGNOSIS:   Unintentional weight loss related to chronic illness (poorly controlled DM) as evidenced by per patient/family report.  GOAL:   Patient will meet greater than or equal to 90% of their needs  MONITOR:   PO intake,Supplement acceptance,Labs,Weight trends,I & O's  REASON FOR ASSESSMENT:   Consult Assessment of nutrition requirement/status  ASSESSMENT:   65 year old female with PMHx of DM, HTN, GERD, HLD admitted with DKA secondary to poor insulin compliance, AKI.   Met with patient at bedside. She was already dressed in clothes and ready to leave at time of RD assessment so only able to complete quick assessment. Patient reports she has a good appetite and intake and is eating 100% of 3 meals daily. She typically eats chicken, [place], or fish and will have vegetables or other sides. She reports at home she drinks Glucerna to help meet calorie/protein needs. She does endorse occasionally having periods where her appetite is not good and she is unable to eat well. Discussed other oral nutrition supplement options that would work well for patient including a recipe for a homemade supplement. Also encouraged adequate intake of protein at meals and discussed which foods contain protein.  Patient reports her UBW was 180 lbs for most of her adult life. Over many years patient reports she has lost down to her current weight. Patient is currently 43.5 kg (95.9 lbs).   Medications reviewed and include: Novolog 0-15 units TID and QHS, Lantus 20  units daily, Protonix.  Labs reviewed: CBG 135-239, Chloride 97, BUN 36, Creatinine 1.27.  RD does suspect patient is malnourished but unable to determine if she meets criteria at this time without completing NFPE.  NUTRITION - FOCUSED PHYSICAL EXAM:  Unable to complete NFPE as patient is dressed in jeans, sweater, and boots as she is now pending discharge.  Diet Order:   Diet Order            Diet - low sodium heart healthy           Diet heart healthy/carb modified Room service appropriate? Yes; Fluid consistency: Thin  Diet effective now                EDUCATION NEEDS:   Education needs have been addressed  Skin:  Skin Assessment: Reviewed RN Assessment  Last BM:  Unknown  Height:   Ht Readings from Last 1 Encounters:  02/25/20 5' 3"  (1.6 m)   Weight:   Wt Readings from Last 1 Encounters:  02/25/20 43.5 kg   BMI:  Body mass index is 16.99 kg/m.  Estimated Nutritional Needs:   Kcal:  1400-1600  Protein:  70-80 grams  Fluid:  1.4-1.6 L/day  Jacklynn Barnacle, MS, RD, LDN Pager number available on Amion

## 2020-03-06 ENCOUNTER — Other Ambulatory Visit: Payer: Self-pay

## 2020-03-06 ENCOUNTER — Encounter: Payer: Self-pay | Admitting: Radiology

## 2020-03-06 ENCOUNTER — Emergency Department
Admission: EM | Admit: 2020-03-06 | Discharge: 2020-03-06 | Disposition: A | Discharge status: Home / Self Care | Payer: 59 | Attending: Emergency Medicine | Admitting: Emergency Medicine

## 2020-03-06 ENCOUNTER — Emergency Department: Payer: 59

## 2020-03-06 DIAGNOSIS — K529 Noninfective gastroenteritis and colitis, unspecified: Secondary | ICD-10-CM | POA: Diagnosis not present

## 2020-03-06 DIAGNOSIS — Z79899 Other long term (current) drug therapy: Secondary | ICD-10-CM | POA: Diagnosis not present

## 2020-03-06 DIAGNOSIS — I10 Essential (primary) hypertension: Secondary | ICD-10-CM | POA: Diagnosis not present

## 2020-03-06 DIAGNOSIS — R1084 Generalized abdominal pain: Secondary | ICD-10-CM | POA: Diagnosis present

## 2020-03-06 DIAGNOSIS — E10649 Type 1 diabetes mellitus with hypoglycemia without coma: Secondary | ICD-10-CM | POA: Insufficient documentation

## 2020-03-06 DIAGNOSIS — J449 Chronic obstructive pulmonary disease, unspecified: Secondary | ICD-10-CM | POA: Insufficient documentation

## 2020-03-06 DIAGNOSIS — E162 Hypoglycemia, unspecified: Secondary | ICD-10-CM

## 2020-03-06 DIAGNOSIS — E1010 Type 1 diabetes mellitus with ketoacidosis without coma: Secondary | ICD-10-CM | POA: Diagnosis not present

## 2020-03-06 DIAGNOSIS — F17200 Nicotine dependence, unspecified, uncomplicated: Secondary | ICD-10-CM | POA: Insufficient documentation

## 2020-03-06 LAB — CBC WITH DIFFERENTIAL/PLATELET
Abs Immature Granulocytes: 0.03 10*3/uL (ref 0.00–0.07)
Basophils Absolute: 0.1 10*3/uL (ref 0.0–0.1)
Basophils Relative: 1 %
Eosinophils Absolute: 0 10*3/uL (ref 0.0–0.5)
Eosinophils Relative: 1 %
HCT: 33.9 % — ABNORMAL LOW (ref 36.0–46.0)
Hemoglobin: 11.4 g/dL — ABNORMAL LOW (ref 12.0–15.0)
Immature Granulocytes: 0 %
Lymphocytes Relative: 39 %
Lymphs Abs: 2.8 10*3/uL (ref 0.7–4.0)
MCH: 32.6 pg (ref 26.0–34.0)
MCHC: 33.6 g/dL (ref 30.0–36.0)
MCV: 96.9 fL (ref 80.0–100.0)
Monocytes Absolute: 0.5 10*3/uL (ref 0.1–1.0)
Monocytes Relative: 7 %
Neutro Abs: 3.8 10*3/uL (ref 1.7–7.7)
Neutrophils Relative %: 52 %
Platelets: 292 10*3/uL (ref 150–400)
RBC: 3.5 MIL/uL — ABNORMAL LOW (ref 3.87–5.11)
RDW: 13.6 % (ref 11.5–15.5)
WBC: 7.3 10*3/uL (ref 4.0–10.5)
nRBC: 0 % (ref 0.0–0.2)

## 2020-03-06 LAB — COMPREHENSIVE METABOLIC PANEL
ALT: 29 U/L (ref 0–44)
AST: 30 U/L (ref 15–41)
Albumin: 3.4 g/dL — ABNORMAL LOW (ref 3.5–5.0)
Alkaline Phosphatase: 121 U/L (ref 38–126)
Anion gap: 10 (ref 5–15)
BUN: 25 mg/dL — ABNORMAL HIGH (ref 8–23)
CO2: 28 mmol/L (ref 22–32)
Calcium: 10.4 mg/dL — ABNORMAL HIGH (ref 8.9–10.3)
Chloride: 102 mmol/L (ref 98–111)
Creatinine, Ser: 0.79 mg/dL (ref 0.44–1.00)
GFR, Estimated: >60 mL/min (ref >60)
Glucose, Bld: 75 mg/dL (ref 70–99)
Potassium: 3.6 mmol/L (ref 3.5–5.1)
Sodium: 140 mmol/L (ref 135–145)
Total Bilirubin: 0.4 mg/dL (ref 0.3–1.2)
Total Protein: 7.3 g/dL (ref 6.5–8.1)

## 2020-03-06 LAB — CBG MONITORING, ED
Glucose-Capillary: 47 mg/dL — ABNORMAL LOW (ref 70–99)
Glucose-Capillary: 112 mg/dL — ABNORMAL HIGH (ref 70–99)

## 2020-03-06 LAB — LACTIC ACID, PLASMA: Lactic Acid, Venous: 1.1 mmol/L (ref 0.5–1.9)

## 2020-03-06 LAB — LIPASE, BLOOD: Lipase: 26 U/L (ref 11–51)

## 2020-03-06 MED ORDER — IOHEXOL 300 MG/ML  SOLN
75.0000 mL | Freq: Once | INTRAMUSCULAR | Status: AC | PRN
  Administered 2020-03-06: 75 mL via INTRAVENOUS

## 2020-03-06 MED ORDER — ONDANSETRON HCL 4 MG/2ML IJ SOLN: 4.0000 mg | Freq: Four times a day (QID) | INTRAMUSCULAR | Status: DC | PRN

## 2020-03-06 MED ORDER — METRONIDAZOLE 500 MG PO TABS: 500.0000 mg | ORAL_TABLET | Freq: Three times a day (TID) | ORAL | 0 refills | Status: AC

## 2020-03-06 MED ORDER — METRONIDAZOLE 500 MG PO TABS
500.0000 mg | ORAL_TABLET | Freq: Once | ORAL | Status: AC
  Administered 2020-03-06: 500 mg via ORAL
  Filled 2020-03-06: qty 1

## 2020-03-06 MED ORDER — CIPROFLOXACIN HCL 500 MG PO TABS: 500.0000 mg | ORAL_TABLET | Freq: Two times a day (BID) | ORAL | 0 refills | Status: AC

## 2020-03-06 MED ORDER — CIPROFLOXACIN HCL 500 MG PO TABS
500.0000 mg | ORAL_TABLET | Freq: Once | ORAL | Status: AC
  Administered 2020-03-06: 500 mg via ORAL
  Filled 2020-03-06: qty 1

## 2020-03-06 MED ORDER — KETOROLAC TROMETHAMINE 30 MG/ML IJ SOLN
30.0000 mg | Freq: Once | INTRAMUSCULAR | Status: AC
  Administered 2020-03-06: 30 mg via INTRAVENOUS
  Filled 2020-03-06: qty 1

## 2020-03-06 MED ORDER — ONDANSETRON 4 MG PO TBDP: 4.0000 mg | ORAL_TABLET | Freq: Three times a day (TID) | ORAL | 0 refills | Status: DC | PRN

## 2020-03-06 NOTE — ED Notes (Signed)
Pt verbalized understanding of d/c instructions at this time. Pt denied further questions. Pt ambulatory to lobby at this time, NAD noted, steady gait noted, RR even and unlabored

## 2020-03-06 NOTE — ED Triage Notes (Signed)
Pt arrived via [facility] from home with c/o abdominal pain starting yesterday. Per EMS, pt originally called for CBG >500 on personal glucometer. Per EMS, pt CBG was 97.  Pt c/o abdominal pain with radiation to back.   Per EMS, pt got 59mcg fentanyl en route.   EMS VS: 220/110, HR 84, 99% RA, CO2 48.

## 2020-03-06 NOTE — ED Provider Notes (Signed)
[facility] Emergency Department Provider Note   ____________________________________________   Event Date/Time   First MD Initiated Contact with Patient 03/06/20 1603     (approximate)  I have reviewed the triage vital signs and the nursing notes.   HISTORY  Chief Complaint Abdominal Pain    HPI Erica Mercer is a 65 y.o. female with a history of type 1 diabetes, COPD, cocaine abuse, and hyperlipidemia who presents via EMS with complaints of abdominal pain, hypertension, and hyperglycemia.  Patient describes 10/10 generalized abdominal pain that she states radiates through to her back and has no exacerbating or relieving factors.  Patient received 75 mcg of fentanyl in route.  Fingerstick blood glucose per EMS was 97 despite patient's reported home fingerstick on her monitor that read greater than 500.  Of note patient was significantly hypertensive in route with a last BP measurement 220/110.  Patient also endorses associated nausea/vomiting of approximately 4 episodes with nonbloody emesis over the course of this morning.  Patient currently denies any vision changes, tinnitus, difficulty speaking, facial droop, sore throat, chest pain, shortness of breath, diarrhea, dysuria, or weakness/numbness/paresthesias in any extremity         Past Medical History:  Diagnosis Date  . Diabetes mellitus without complication [facility])     Patient Active Problem List   Diagnosis Date Noted  . DKA, type 1 ([place]) 02/26/2020  . Acute renal failure ([place])   . DKA (diabetic ketoacidosis) ([place]) 02/25/2020  . Malnutrition ([place]) 02/25/2020  . Essential hypertension 02/25/2020  . HLD (hyperlipidemia) 02/25/2020  . Soft tissue mass 02/25/2020  . H/O cocaine abuse ([place]) 02/17/2018  . Bipolar 1 disorder ([place]) 04/22/2016  . Old myocardial infarction 04/22/2016  . COPD (chronic obstructive pulmonary disease) ([place]) 01/22/2016    No past surgical history on file.  Prior to  Admission medications   Medication Sig Start Date End Date Taking? Authorizing Provider  ciprofloxacin (CIPRO) 500 MG tablet Take 1 tablet (500 mg total) by mouth 2 (two) times daily for 7 days. 03/06/20 03/13/20 Yes Leith Szafranski, Vista Lawman, MD  metroNIDAZOLE (FLAGYL) 500 MG tablet Take 1 tablet (500 mg total) by mouth 3 (three) times daily for 7 days. 03/06/20 03/13/20 Yes Naaman Plummer, MD  ondansetron (ZOFRAN ODT) 4 MG disintegrating tablet Take 1 tablet (4 mg total) by mouth every 8 (eight) hours as needed for nausea or vomiting. 03/06/20  Yes Elliet Goodnow, Vista Lawman, MD  albuterol (VENTOLIN HFA) 108 (90 Base) MCG/ACT inhaler Inhale 2 puffs into the lungs every 6 (six) hours as needed for wheezing. 02/21/20 02/15/21  [provider]  amLODipine (NORVASC) 10 MG tablet Take 10 mg by mouth daily. 02/21/20 02/15/21  [provider]  atorvastatin (LIPITOR) 40 MG tablet Take 40 mg by mouth at bedtime. 02/21/20 02/15/21  [provider]  insulin glargine (LANTUS SOLOSTAR) 100 UNIT/ML Solostar Pen Inject 25 Units into the skin daily. 02/27/20 02/21/21  Wouk, Ailene Rud, MD  insulin lispro (HUMALOG) 100 UNIT/ML KwikPen Inject 6 Units into the skin 3 (three) times daily with meals. 11/09/19   [provider]  losartan (COZAAR) 50 MG tablet Take 50 mg by mouth daily. 02/21/20 02/15/21  [provider]  omeprazole (PRILOSEC) 40 MG capsule Take 40 mg by mouth daily. 02/21/20 05/21/20  [provider]  pregabalin (LYRICA) 50 MG capsule Take 50 mg by mouth 2 (two) times daily. 11/09/19   [provider]    Allergies Patient has no allergy information on record.  No family history on file.  Social History Social History   Tobacco Use  . Smoking status: Current Every Day Smoker  . Smokeless tobacco: Never Used  Vaping Use  . Vaping Use: Never used  Substance Use Topics  . Alcohol use: Not Currently  . Drug use: Yes    Frequency: 3.0 times per week    Types:  Marijuana    Review of Systems Constitutional: No fever/chills Eyes: No visual changes. ENT: No sore throat. Cardiovascular: Denies chest pain. Respiratory: Denies shortness of breath. Gastrointestinal: Endorses abdominal pain/nausea/vomiting.  No diarrhea. Genitourinary: Negative for dysuria. Musculoskeletal: Negative for acute arthralgias Skin: Negative for rash. Neurological: Negative for headaches, weakness/numbness/paresthesias in any extremity Psychiatric: Negative for suicidal ideation/homicidal ideation   ____________________________________________   PHYSICAL EXAM:  VITAL SIGNS: ED Triage Vitals  Enc Vitals Group     BP 03/06/20 1602 (!) 218/113     Pulse Rate 03/06/20 1602 83     Resp 03/06/20 1602 19     Temp 03/06/20 1602 97.6 F (36.4 C)     Temp Source 03/06/20 1602 Oral     SpO2 03/06/20 1602 100 %     Weight --      Height --      Head Circumference --      Peak Flow --      Pain Score 03/06/20 1603 4     Pain Loc --      Pain Edu? --      Excl. in [place]? --    Constitutional: Alert and oriented. Well appearing and in no acute distress. Eyes: Conjunctivae are normal. PERRL. Head: Atraumatic. Nose: No congestion/rhinnorhea. Mouth/Throat: Mucous membranes are moist. Neck: No stridor Cardiovascular: Grossly normal heart sounds.  Good peripheral circulation. Respiratory: Normal respiratory effort.  No retractions. Gastrointestinal: Soft and generalized tenderness to palpation. No distention. Musculoskeletal: No obvious deformities Neurologic:  Normal speech and language. No gross focal neurologic deficits are appreciated. Skin:  Skin is warm and dry. No rash noted. Psychiatric: Mood and affect are normal. Speech and behavior are normal.  ____________________________________________   LABS (all labs ordered are listed, but only abnormal results are displayed)  Labs Reviewed  COMPREHENSIVE METABOLIC PANEL - Abnormal; Notable for the following  components:      Result Value   BUN 25 (*)    Calcium 10.4 (*)    Albumin 3.4 (*)    All other components within normal limits  CBC WITH DIFFERENTIAL/PLATELET - Abnormal; Notable for the following components:   RBC 3.50 (*)    Hemoglobin 11.4 (*)    HCT 33.9 (*)    All other components within normal limits  CBG MONITORING, ED - Abnormal; Notable for the following components:   Glucose-Capillary 47 (*)    All other components within normal limits  CBG MONITORING, ED - Abnormal; Notable for the following components:   Glucose-Capillary 112 (*)    All other components within normal limits  LACTIC ACID, PLASMA  LIPASE, BLOOD  URINALYSIS, COMPLETE (UACMP) WITH MICROSCOPIC   ____________________________________________  EKG  ED ECG REPORT I, Naaman Plummer, the attending physician, personally viewed and interpreted this ECG.  Date: 03/06/2020 EKG Time: 1602 Rate: 85 Rhythm: normal sinus rhythm QRS Axis: normal Intervals: normal ST/T Wave abnormalities: normal Narrative Interpretation: no evidence of acute ischemia  ____________________________________________  RADIOLOGY  ED MD interpretation: CT of the abdomen and pelvis with IV contrast shows wall thickening of the ascending colon consistent with segmental colitis  Official  radiology report(s): CT Abdomen Pelvis W Contrast  Result Date: 03/06/2020 CLINICAL DATA:  Abdominal pain radiating to back EXAM: CT ABDOMEN AND PELVIS WITH CONTRAST TECHNIQUE: Multidetector CT imaging of the abdomen and pelvis was performed using the standard protocol following bolus administration of intravenous contrast. CONTRAST:  43mL OMNIPAQUE IOHEXOL 300 MG/ML  SOLN COMPARISON:  None. FINDINGS: Lower chest: No acute pleural or parenchymal lung disease. Hepatobiliary: No focal liver abnormality is seen. No gallstones, gallbladder wall thickening, or biliary dilatation. Pancreas: Unremarkable. No pancreatic ductal dilatation or surrounding  inflammatory changes. Spleen: Normal in size without focal abnormality. Adrenals/Urinary Tract: There is mild right renal cortical atrophy. 5 mm nonobstructing right renal calculus. The left kidney is unremarkable. Bladder is moderately distended without focal abnormality. The adrenals are normal. Stomach/Bowel: No bowel obstruction or ileus. There is wall thickening of the ascending colon consistent with segmental colitis. Normal appendix right lower quadrant. Vascular/Lymphatic: Aortic atherosclerosis. No enlarged abdominal or pelvic lymph nodes. Reproductive: 3.5 cm hyperdense area right uterine fundus consistent with fibroid. No adnexal masses. Other: No free fluid or free gas.  No abdominal wall hernia. Musculoskeletal: No acute or destructive bony lesions. Reconstructed images demonstrate no additional findings. IMPRESSION: 1. Wall thickening of the ascending colon consistent with segmental colitis, either inflammatory or infectious. 2. 5 mm nonobstructing right renal calculus. 3.  Aortic Atherosclerosis (ICD10-I70.0). 4. Uterine fibroid. Electronically Signed   By: Randa Ngo M.D.   On: 03/06/2020 17:26    ____________________________________________   PROCEDURES  Procedure(s) performed (including Critical Care):  .1-3 Lead EKG Interpretation Performed by: Naaman Plummer, MD Authorized by: Naaman Plummer, MD     Interpretation: normal     ECG rate:  67   ECG rate assessment: normal     Rhythm: sinus rhythm     Ectopy: none     Conduction: normal       ____________________________________________   INITIAL IMPRESSION / ASSESSMENT AND PLAN / ED COURSE  As part of my medical decision making, I reviewed the following data within the [place] notes reviewed and incorporated, Labs reviewed, EKG interpreted, Old chart reviewed, Radiograph reviewed and Notes from prior ED visits reviewed and incorporated        Patient has colitis that is amenable to  oral antibiotics. Patient has no peritoneal signs or signs of perforation. Patients symptoms not typical for other emergent causes of abdominal pain such as, but not limited to, appendicitis, abdominal aortic aneurysm, surgical biliary disease, acute coronary syndrome, etc.  Patient will be discharged with strict return precautions and follow up with primary MD within 12-24 hours for further evaluation.  Patient understands that they may require admission and IV antibiotics and possibly surgery if they do not improve with oral antibiotics.      ____________________________________________   FINAL CLINICAL IMPRESSION(S) / ED DIAGNOSES  Final diagnoses:  Colitis  Hypoglycemia  Generalized abdominal pain     ED Discharge Orders         Ordered    ciprofloxacin (CIPRO) 500 MG tablet  2 times daily        03/06/20 1902    metroNIDAZOLE (FLAGYL) 500 MG tablet  3 times daily        03/06/20 1902    ondansetron (ZOFRAN ODT) 4 MG disintegrating tablet  Every 8 hours PRN        03/06/20 1902           Note:  This document was prepared using  [employer] education [employer] and may include unintentional dictation errors.   Naaman Plummer, MD 03/06/20 2018

## 2020-03-06 NOTE — ED Notes (Signed)
This RN to bedside at this time. Pt called out d/t feeling like her blood sugar was low. Upon checking, pt CBG 47. MD Cheri Fowler made aware at this time. Per verbal order from MD Bradler, give orange juice and recheck CBG after.   Pt given orange juice at this time and told to call out with any needs.

## 2020-03-06 NOTE — ED Notes (Signed)
Per verbal order from MD Bradler, d/c second lactic at this time

## 2020-03-07 ENCOUNTER — Other Ambulatory Visit: Payer: Self-pay

## 2020-03-07 ENCOUNTER — Emergency Department
Admission: EM | Admit: 2020-03-07 | Discharge: 2020-03-07 | Disposition: A | Discharge status: Home / Self Care | Payer: 59 | Attending: Emergency Medicine | Admitting: Emergency Medicine

## 2020-03-07 ENCOUNTER — Encounter: Payer: Self-pay | Admitting: Emergency Medicine

## 2020-03-07 DIAGNOSIS — Z5321 Procedure and treatment not carried out due to patient leaving prior to being seen by health care provider: Secondary | ICD-10-CM | POA: Diagnosis not present

## 2020-03-07 DIAGNOSIS — R109 Unspecified abdominal pain: Secondary | ICD-10-CM | POA: Diagnosis present

## 2020-03-07 LAB — COMPREHENSIVE METABOLIC PANEL
ALT: 24 U/L (ref 0–44)
AST: 28 U/L (ref 15–41)
Albumin: 3.3 g/dL — ABNORMAL LOW (ref 3.5–5.0)
Alkaline Phosphatase: 102 U/L (ref 38–126)
Anion gap: 14 (ref 5–15)
BUN: 28 mg/dL — ABNORMAL HIGH (ref 8–23)
CO2: 28 mmol/L (ref 22–32)
Calcium: 9.9 mg/dL (ref 8.9–10.3)
Chloride: 98 mmol/L (ref 98–111)
Creatinine, Ser: 1.13 mg/dL — ABNORMAL HIGH (ref 0.44–1.00)
GFR, Estimated: 54 mL/min — ABNORMAL LOW (ref >60)
Glucose, Bld: 112 mg/dL — ABNORMAL HIGH (ref 70–99)
Potassium: 3.9 mmol/L (ref 3.5–5.1)
Sodium: 140 mmol/L (ref 135–145)
Total Bilirubin: 0.4 mg/dL (ref 0.3–1.2)
Total Protein: 6.6 g/dL (ref 6.5–8.1)

## 2020-03-07 LAB — CBC
HCT: 31.8 % — ABNORMAL LOW (ref 36.0–46.0)
Hemoglobin: 10.7 g/dL — ABNORMAL LOW (ref 12.0–15.0)
MCH: 32.8 pg (ref 26.0–34.0)
MCHC: 33.6 g/dL (ref 30.0–36.0)
MCV: 97.5 fL (ref 80.0–100.0)
Platelets: 298 10*3/uL (ref 150–400)
RBC: 3.26 MIL/uL — ABNORMAL LOW (ref 3.87–5.11)
RDW: 13.7 % (ref 11.5–15.5)
WBC: 5.2 10*3/uL (ref 4.0–10.5)
nRBC: 0 % (ref 0.0–0.2)

## 2020-03-07 LAB — LIPASE, BLOOD: Lipase: 25 U/L (ref 11–51)

## 2020-03-07 NOTE — ED Notes (Signed)
Pt to first nurse desk to inquire about wait time. Pt states that she thinks that she is going to call a ride to come get her. Pt states that she was seen last night knows what is wrong. Pt feels some better than when she came in. Pt will call and make sure she has a ride and then let us know if she is going to stay or not. Pt given cup of ice

## 2020-03-07 NOTE — ED Triage Notes (Signed)
Pt to ED via [facility] with c/o abdominal pain, pt seen yesterday for same. Per EMS pt coming from home, has not had BM x 3 days. Per EMS pt is currently being treated for "some infection in her intestines", seen yesterday and prescribed abx and discharged.    181/78 (just took BP meds approx 1 hr PTA) 100% RA 88 HR CBG 147

## 2020-03-17 ENCOUNTER — Emergency Department
Admission: EM | Admit: 2020-03-17 | Discharge: 2020-03-17 | Disposition: A | Discharge status: Home / Self Care | Payer: 59 | Attending: Emergency Medicine | Admitting: Emergency Medicine

## 2020-03-17 ENCOUNTER — Encounter: Payer: Self-pay | Admitting: Radiology

## 2020-03-17 ENCOUNTER — Emergency Department: Payer: 59

## 2020-03-17 ENCOUNTER — Other Ambulatory Visit: Payer: Self-pay

## 2020-03-17 DIAGNOSIS — I10 Essential (primary) hypertension: Secondary | ICD-10-CM | POA: Diagnosis not present

## 2020-03-17 DIAGNOSIS — E1010 Type 1 diabetes mellitus with ketoacidosis without coma: Secondary | ICD-10-CM | POA: Insufficient documentation

## 2020-03-17 DIAGNOSIS — F17200 Nicotine dependence, unspecified, uncomplicated: Secondary | ICD-10-CM | POA: Diagnosis not present

## 2020-03-17 DIAGNOSIS — R197 Diarrhea, unspecified: Secondary | ICD-10-CM | POA: Diagnosis not present

## 2020-03-17 DIAGNOSIS — J449 Chronic obstructive pulmonary disease, unspecified: Secondary | ICD-10-CM | POA: Diagnosis not present

## 2020-03-17 DIAGNOSIS — R109 Unspecified abdominal pain: Secondary | ICD-10-CM | POA: Diagnosis present

## 2020-03-17 DIAGNOSIS — Z79899 Other long term (current) drug therapy: Secondary | ICD-10-CM | POA: Diagnosis not present

## 2020-03-17 DIAGNOSIS — R112 Nausea with vomiting, unspecified: Secondary | ICD-10-CM | POA: Diagnosis not present

## 2020-03-17 LAB — COMPREHENSIVE METABOLIC PANEL
ALT: 23 U/L (ref 0–44)
AST: 22 U/L (ref 15–41)
Albumin: 3.4 g/dL — ABNORMAL LOW (ref 3.5–5.0)
Alkaline Phosphatase: 140 U/L — ABNORMAL HIGH (ref 38–126)
Anion gap: 12 (ref 5–15)
BUN: 33 mg/dL — ABNORMAL HIGH (ref 8–23)
CO2: 22 mmol/L (ref 22–32)
Calcium: 9.5 mg/dL (ref 8.9–10.3)
Chloride: 97 mmol/L — ABNORMAL LOW (ref 98–111)
Creatinine, Ser: 1.07 mg/dL — ABNORMAL HIGH (ref 0.44–1.00)
GFR, Estimated: 58 mL/min — ABNORMAL LOW (ref >60)
Glucose, Bld: 409 mg/dL — ABNORMAL HIGH (ref 70–99)
Potassium: 4.2 mmol/L (ref 3.5–5.1)
Sodium: 131 mmol/L — ABNORMAL LOW (ref 135–145)
Total Bilirubin: 1.2 mg/dL (ref 0.3–1.2)
Total Protein: 7 g/dL (ref 6.5–8.1)

## 2020-03-17 LAB — CBC
HCT: 39.2 % (ref 36.0–46.0)
Hemoglobin: 13.1 g/dL (ref 12.0–15.0)
MCH: 32.8 pg (ref 26.0–34.0)
MCHC: 33.4 g/dL (ref 30.0–36.0)
MCV: 98.2 fL (ref 80.0–100.0)
Platelets: 365 10*3/uL (ref 150–400)
RBC: 3.99 MIL/uL (ref 3.87–5.11)
RDW: 13.4 % (ref 11.5–15.5)
WBC: 4.8 10*3/uL (ref 4.0–10.5)
nRBC: 0 % (ref 0.0–0.2)

## 2020-03-17 LAB — COMPREHENSIVE METABOLIC PANEL WITH GFR
ALT: 22 U/L (ref 0–44)
AST: 24 U/L (ref 15–41)
Albumin: 3.8 g/dL (ref 3.5–5.0)
Alkaline Phosphatase: 154 U/L — ABNORMAL HIGH (ref 38–126)
Anion gap: 15 (ref 5–15)
BUN: 31 mg/dL — ABNORMAL HIGH (ref 8–23)
CO2: 18 mmol/L — ABNORMAL LOW (ref 22–32)
Calcium: 10 mg/dL (ref 8.9–10.3)
Chloride: 100 mmol/L (ref 98–111)
Creatinine, Ser: UNDETERMINED mg/dL (ref 0.44–1.00)
Glucose, Bld: 447 mg/dL — ABNORMAL HIGH (ref 70–99)
Potassium: 4.6 mmol/L (ref 3.5–5.1)
Sodium: 133 mmol/L — ABNORMAL LOW (ref 135–145)
Total Bilirubin: 1.2 mg/dL (ref 0.3–1.2)
Total Protein: 7.4 g/dL (ref 6.5–8.1)

## 2020-03-17 LAB — URINALYSIS, COMPLETE (UACMP) WITH MICROSCOPIC
Bilirubin Urine: NEGATIVE
Glucose, UA: >=500 mg/dL — AB
Hgb urine dipstick: NEGATIVE
Ketones, ur: 5 mg/dL — AB
Leukocytes,Ua: NEGATIVE
Nitrite: NEGATIVE
Protein, ur: >=300 mg/dL — AB
Specific Gravity, Urine: 1.018 (ref 1.005–1.030)
pH: 5 (ref 5.0–8.0)

## 2020-03-17 LAB — CBG MONITORING, ED: Glucose-Capillary: 434 mg/dL — ABNORMAL HIGH (ref 70–99)

## 2020-03-17 LAB — LIPASE, BLOOD: Lipase: 24 U/L (ref 11–51)

## 2020-03-17 MED ORDER — DOCUSATE SODIUM 100 MG PO CAPS: 100.0000 mg | ORAL_CAPSULE | Freq: Two times a day (BID) | ORAL | 2 refills | Status: AC

## 2020-03-17 MED ORDER — IOHEXOL 300 MG/ML  SOLN
75.0000 mL | Freq: Once | INTRAMUSCULAR | Status: AC | PRN
  Administered 2020-03-17: 75 mL via INTRAVENOUS

## 2020-03-17 MED ORDER — ONDANSETRON HCL 4 MG/2ML IJ SOLN
4.0000 mg | Freq: Once | INTRAMUSCULAR | Status: AC
  Administered 2020-03-17: 4 mg via INTRAVENOUS
  Filled 2020-03-17: qty 2

## 2020-03-17 MED ORDER — TRAMADOL HCL 50 MG PO TABS: 50.0000 mg | ORAL_TABLET | Freq: Four times a day (QID) | ORAL | 0 refills | Status: DC | PRN

## 2020-03-17 MED ORDER — INSULIN ASPART 100 UNIT/ML ~~LOC~~ SOLN
6.0000 [IU] | Freq: Once | SUBCUTANEOUS | Status: AC
  Administered 2020-03-17: 6 [IU] via INTRAVENOUS
  Filled 2020-03-17: qty 1

## 2020-03-17 MED ORDER — MORPHINE SULFATE (PF) 4 MG/ML IV SOLN
4.0000 mg | Freq: Once | INTRAVENOUS | Status: AC
  Administered 2020-03-17: 4 mg via INTRAVENOUS
  Filled 2020-03-17: qty 1

## 2020-03-17 MED ORDER — IOHEXOL 9 MG/ML PO SOLN
500.0000 mL | ORAL | Status: AC
  Administered 2020-03-17 (×2): 500 mL via ORAL

## 2020-03-17 MED ORDER — SODIUM CHLORIDE 0.9 % IV BOLUS
1000.0000 mL | Freq: Once | INTRAVENOUS | Status: AC
  Administered 2020-03-17: 1000 mL via INTRAVENOUS

## 2020-03-17 NOTE — ED Triage Notes (Signed)
Pt to ED via [facility] from home. Pt seen last week for abdominal pain and dx with large intestine infection. Pt d/c on antibiotics but hasn't started them due to prescription being sent to wrong location. Pt CBG 545 and HTN at 201/113. Pt stating she hasn't taken any home medication this morning.   CBG on arrival 434. Pt c/o mid abdominal pain that has been constant and gotten worse since last being seen.

## 2020-03-17 NOTE — ED Notes (Addendum)
Pt assisted to bathroom with unsteady gait. Pt unable to urinate and this RN unable to collect urine sample at this time.

## 2020-03-17 NOTE — ED Notes (Signed)
Lab called to collect blood work.

## 2020-03-17 NOTE — ED Notes (Signed)
MD at bedside. 

## 2020-03-17 NOTE — ED Provider Notes (Signed)
[facility] Emergency Department Provider Note  Time seen: 9:39 AM  I have reviewed the triage vital signs and the nursing notes.   HISTORY  Chief Complaint Abdominal Pain   HPI Erica Mercer is a 65 y.o. female with a past medical history of diabetes, hypertension, bipolar, COPD, presents to the emergency department for abdominal pain.  According to the patient over the past 1 to 2 weeks she has been experiencing central abdominal pain.  States she was seen for the same recently and diagnosed with colitis, patient never took her antibiotics due to a pharmacy issue per patient.  Patient states her pain has gotten worse she is now nauseated with frequent vomiting as well as occasional diarrhea.  Denies black or bloody stool.  Denies fever.  Patient states fairly diffuse abdominal pain, severe per patient.  Past Medical History:  Diagnosis Date  . Diabetes mellitus without complication [facility])     Patient Active Problem List   Diagnosis Date Noted  . DKA, type 1 ([place]) 02/26/2020  . Acute renal failure ([place])   . DKA (diabetic ketoacidosis) ([place]) 02/25/2020  . Malnutrition ([place]) 02/25/2020  . Essential hypertension 02/25/2020  . HLD (hyperlipidemia) 02/25/2020  . Soft tissue mass 02/25/2020  . H/O cocaine abuse ([place]) 02/17/2018  . Bipolar 1 disorder ([place]) 04/22/2016  . Old myocardial infarction 04/22/2016  . COPD (chronic obstructive pulmonary disease) ([place]) 01/22/2016    No past surgical history on file.  Prior to Admission medications   Medication Sig Start Date End Date Taking? Authorizing Provider  albuterol (VENTOLIN HFA) 108 (90 Base) MCG/ACT inhaler Inhale 2 puffs into the lungs every 6 (six) hours as needed for wheezing. 02/21/20 02/15/21  [provider]  amLODipine (NORVASC) 10 MG tablet Take 10 mg by mouth daily. 02/21/20 02/15/21  [provider]  atorvastatin (LIPITOR) 40 MG tablet Take 40 mg by mouth at bedtime. 02/21/20 02/15/21   [provider]  insulin glargine (LANTUS SOLOSTAR) 100 UNIT/ML Solostar Pen Inject 25 Units into the skin daily. 02/27/20 02/21/21  Wouk, Ailene Rud, MD  insulin lispro (HUMALOG) 100 UNIT/ML KwikPen Inject 6 Units into the skin 3 (three) times daily with meals. 11/09/19   [provider]  losartan (COZAAR) 50 MG tablet Take 50 mg by mouth daily. 02/21/20 02/15/21  [provider]  omeprazole (PRILOSEC) 40 MG capsule Take 40 mg by mouth daily. 02/21/20 05/21/20  [provider]  ondansetron (ZOFRAN ODT) 4 MG disintegrating tablet Take 1 tablet (4 mg total) by mouth every 8 (eight) hours as needed for nausea or vomiting. 03/06/20   Naaman Plummer, MD  pregabalin (LYRICA) 50 MG capsule Take 50 mg by mouth 2 (two) times daily. 11/09/19   [provider]    No Known Allergies  No family history on file.  Social History Social History   Tobacco Use  . Smoking status: Current Every Day Smoker  . Smokeless tobacco: Never Used  Vaping Use  . Vaping Use: Never used  Substance Use Topics  . Alcohol use: Not Currently  . Drug use: Yes    Frequency: 3.0 times per week    Types: Marijuana    Review of Systems Constitutional: Negative for fever. Cardiovascular: Negative for chest pain. Respiratory: Negative for shortness of breath. Gastrointestinal: Positive for abdominal pain, nausea, intermittent vomiting and diarrhea. Genitourinary: Negative for urinary compaints.  Negative for dysuria. Musculoskeletal: Negative for musculoskeletal complaints Neurological: Negative for headache All other ROS negative  ____________________________________________  PHYSICAL EXAM:  VITAL SIGNS: ED Triage Vitals  Enc Vitals Group     BP 03/17/20 0904 (!) 212/106     Pulse Rate 03/17/20 0904 89     Resp 03/17/20 0904 (!) 21     Temp 03/17/20 0904 98.7 F (37.1 C)     Temp Source 03/17/20 0904 Oral     SpO2 03/17/20 0904 99 %     Weight 03/17/20 0905 103 lb  9.9 oz (47 kg)     Height 03/17/20 0905 5\' 3"  (1.6 m)     Head Circumference --      Peak Flow --      Pain Score 03/17/20 0905 8     Pain Loc --      Pain Edu? --      Excl. in [place]? --    Constitutional: Alert and oriented. Well appearing and in no distress. Eyes: Normal exam ENT      Head: Normocephalic and atraumatic.      Mouth/Throat: Mucous membranes are moist. Cardiovascular: Normal rate, regular rhythm.  Respiratory: Normal respiratory effort without tachypnea nor retractions. Breath sounds are clear Gastrointestinal: Soft, moderate diffuse tenderness palpation without rebound guarding or distention peer Musculoskeletal: Nontender with normal range of motion in all extremities.  Neurologic:  Normal speech and language. No gross focal neurologic deficits Skin:  Skin is warm, dry and intact.  Psychiatric: Mood and affect are normal.   ____________________________________________    EKG  EKG viewed and interpreted by myself shows a normal sinus rhythm 89 bpm with a narrow QRS, right axis deviation, largely normal intervals with nonspecific ST changes.  ____________________________________________    RADIOLOGY  CT scans essentially negative besides this questionable area of wall thickening in the cecum.  Patient states she had a colonoscopy approximately 6 months ago that was largely normal.  But she states she will follow up with GI medicine once again.  ____________________________________________   INITIAL IMPRESSION / ASSESSMENT AND PLAN / ED COURSE  Pertinent labs & imaging results that were available during my care of the patient were reviewed by me and considered in my medical decision making (see chart for details).   Patient presents to the emergency department for abdominal pain.  I reviewed the patient's chart on 03/06/2020 the patient was seen in the emergency department had a CT scan showing segmental colitis.  Patient did not take antibiotics following this  diagnosis.  States the pain remained constant and is now increasing along with nausea and diarrhea.  We will check labs, repeat a CT scan given worsening pain.  We will treat pain nausea and IV hydrate while awaiting results.  CT scan is essentially negative.  Patient is feeling better.  I discussed with the patient follow-up with her GI doctor who performed the colonoscopy.  Patient agreeable plan of care.  Patient states a history of significant constipation.  We will prescribe Colace twice daily for the patient.  Patient agreeable to plan of care.  Leeona Mccardle was evaluated in Emergency Department on 03/17/2020 for the symptoms described in the history of present illness. She was evaluated in the context of the global COVID-19 pandemic, which necessitated consideration that the patient might be at risk for infection with the SARS-CoV-2 virus that causes COVID-19. Institutional protocols and algorithms that pertain to the evaluation of patients at risk for COVID-19 are in a state of rapid change based on information released by regulatory bodies including the CDC and federal and state organizations. These  policies and algorithms were followed during the patient's care in the ED.  ____________________________________________   FINAL CLINICAL IMPRESSION(S) / ED DIAGNOSES  Abdominal pain   Harvest Dark, MD 03/17/20 1432

## 2020-04-25 ENCOUNTER — Other Ambulatory Visit: Payer: Self-pay

## 2020-04-25 ENCOUNTER — Emergency Department
Admission: EM | Admit: 2020-04-25 | Discharge: 2020-04-25 | Disposition: A | Discharge status: Home / Self Care | Payer: 59 | Attending: Emergency Medicine | Admitting: Emergency Medicine

## 2020-04-25 ENCOUNTER — Emergency Department: Payer: 59

## 2020-04-25 DIAGNOSIS — R1013 Epigastric pain: Secondary | ICD-10-CM | POA: Insufficient documentation

## 2020-04-25 DIAGNOSIS — F17200 Nicotine dependence, unspecified, uncomplicated: Secondary | ICD-10-CM | POA: Diagnosis not present

## 2020-04-25 DIAGNOSIS — R112 Nausea with vomiting, unspecified: Secondary | ICD-10-CM | POA: Insufficient documentation

## 2020-04-25 DIAGNOSIS — I10 Essential (primary) hypertension: Secondary | ICD-10-CM | POA: Diagnosis not present

## 2020-04-25 DIAGNOSIS — E1165 Type 2 diabetes mellitus with hyperglycemia: Secondary | ICD-10-CM

## 2020-04-25 DIAGNOSIS — J449 Chronic obstructive pulmonary disease, unspecified: Secondary | ICD-10-CM | POA: Insufficient documentation

## 2020-04-25 DIAGNOSIS — I2510 Atherosclerotic heart disease of native coronary artery without angina pectoris: Secondary | ICD-10-CM | POA: Diagnosis not present

## 2020-04-25 DIAGNOSIS — Z79899 Other long term (current) drug therapy: Secondary | ICD-10-CM | POA: Insufficient documentation

## 2020-04-25 DIAGNOSIS — E1065 Type 1 diabetes mellitus with hyperglycemia: Secondary | ICD-10-CM | POA: Insufficient documentation

## 2020-04-25 DIAGNOSIS — Z794 Long term (current) use of insulin: Secondary | ICD-10-CM | POA: Diagnosis not present

## 2020-04-25 LAB — CBC WITH DIFFERENTIAL/PLATELET
Abs Immature Granulocytes: 0.02 10*3/uL (ref 0.00–0.07)
Basophils Absolute: 0 10*3/uL (ref 0.0–0.1)
Basophils Relative: 1 %
Eosinophils Absolute: 0 10*3/uL (ref 0.0–0.5)
Eosinophils Relative: 0 %
HCT: 37 % (ref 36.0–46.0)
Hemoglobin: 12.7 g/dL (ref 12.0–15.0)
Immature Granulocytes: 0 %
Lymphocytes Relative: 47 %
Lymphs Abs: 2.3 10*3/uL (ref 0.7–4.0)
MCH: 32.6 pg (ref 26.0–34.0)
MCHC: 34.3 g/dL (ref 30.0–36.0)
MCV: 95.1 fL (ref 80.0–100.0)
Monocytes Absolute: 0.2 10*3/uL (ref 0.1–1.0)
Monocytes Relative: 5 %
Neutro Abs: 2.3 10*3/uL (ref 1.7–7.7)
Neutrophils Relative %: 47 %
Platelets: 282 10*3/uL (ref 150–400)
RBC: 3.89 MIL/uL (ref 3.87–5.11)
RDW: 12.6 % (ref 11.5–15.5)
WBC: 4.9 10*3/uL (ref 4.0–10.5)
nRBC: 0 % (ref 0.0–0.2)

## 2020-04-25 LAB — COMPREHENSIVE METABOLIC PANEL
ALT: 21 U/L (ref 0–44)
AST: 21 U/L (ref 15–41)
Albumin: 3.2 g/dL — ABNORMAL LOW (ref 3.5–5.0)
Alkaline Phosphatase: 138 U/L — ABNORMAL HIGH (ref 38–126)
Anion gap: 11 (ref 5–15)
BUN: 43 mg/dL — ABNORMAL HIGH (ref 8–23)
CO2: 28 mmol/L (ref 22–32)
Calcium: 9.9 mg/dL (ref 8.9–10.3)
Chloride: 97 mmol/L — ABNORMAL LOW (ref 98–111)
Creatinine, Ser: 1.42 mg/dL — ABNORMAL HIGH (ref 0.44–1.00)
GFR, Estimated: 41 mL/min — ABNORMAL LOW (ref >60)
Glucose, Bld: 331 mg/dL — ABNORMAL HIGH (ref 70–99)
Potassium: 4 mmol/L (ref 3.5–5.1)
Sodium: 136 mmol/L (ref 135–145)
Total Bilirubin: 0.7 mg/dL (ref 0.3–1.2)
Total Protein: 6.3 g/dL — ABNORMAL LOW (ref 6.5–8.1)

## 2020-04-25 LAB — URINALYSIS, COMPLETE (UACMP) WITH MICROSCOPIC
Bacteria, UA: NONE SEEN
Bilirubin Urine: NEGATIVE
Glucose, UA: >=500 mg/dL — AB
Hgb urine dipstick: NEGATIVE
Ketones, ur: 20 mg/dL — AB
Leukocytes,Ua: NEGATIVE
Nitrite: NEGATIVE
Protein, ur: >=300 mg/dL — AB
Specific Gravity, Urine: 1.023 (ref 1.005–1.030)
pH: 5 (ref 5.0–8.0)

## 2020-04-25 LAB — CBG MONITORING, ED
Glucose-Capillary: 357 mg/dL — ABNORMAL HIGH (ref 70–99)
Glucose-Capillary: 164 mg/dL — ABNORMAL HIGH (ref 70–99)

## 2020-04-25 LAB — BLOOD GAS, VENOUS
Acid-Base Excess: 5.9 mmol/L — ABNORMAL HIGH (ref 0.0–2.0)
Bicarbonate: 32.2 mmol/L — ABNORMAL HIGH (ref 20.0–28.0)
O2 Saturation: 65.5 %
Patient temperature: 37
pCO2, Ven: 52 mmHg (ref 44.0–60.0)
pH, Ven: 7.4 (ref 7.250–7.430)
pO2, Ven: 34 mmHg (ref 32.0–45.0)

## 2020-04-25 LAB — BETA-HYDROXYBUTYRIC ACID: Beta-Hydroxybutyric Acid: 0.18 mmol/L (ref 0.05–0.27)

## 2020-04-25 LAB — LACTIC ACID, PLASMA
Lactic Acid, Venous: 2.8 mmol/L (ref 0.5–1.9)
Lactic Acid, Venous: 1.4 mmol/L (ref 0.5–1.9)

## 2020-04-25 MED ORDER — ONDANSETRON 4 MG PO TBDP: 4.0000 mg | ORAL_TABLET | Freq: Four times a day (QID) | ORAL | 0 refills | Status: DC | PRN

## 2020-04-25 MED ORDER — SODIUM CHLORIDE 0.9 % IV BOLUS
1000.0000 mL | Freq: Once | INTRAVENOUS | Status: AC
  Administered 2020-04-25: 1000 mL via INTRAVENOUS

## 2020-04-25 MED ORDER — ONDANSETRON HCL 4 MG/2ML IJ SOLN
4.0000 mg | Freq: Once | INTRAMUSCULAR | Status: AC
  Administered 2020-04-25: 4 mg via INTRAVENOUS
  Filled 2020-04-25: qty 2

## 2020-04-25 MED ORDER — MORPHINE SULFATE (PF) 2 MG/ML IV SOLN
2.0000 mg | Freq: Once | INTRAVENOUS | Status: AC
  Administered 2020-04-25: 2 mg via INTRAVENOUS
  Filled 2020-04-25: qty 1

## 2020-04-25 NOTE — ED Triage Notes (Signed)
Patient arrived via EMS for abdominal pain, N/V, and hyperglycemia. Pt states she always has abdominal pain when her blood sugar is high. Pt states she is not compliant with all her insulin medications.
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
## 2020-04-30 ENCOUNTER — Emergency Department: Payer: 59

## 2020-04-30 ENCOUNTER — Other Ambulatory Visit: Payer: Self-pay

## 2020-04-30 ENCOUNTER — Encounter: Payer: Self-pay | Admitting: Emergency Medicine

## 2020-04-30 ENCOUNTER — Observation Stay
Admission: EM | Admit: 2020-04-30 | Discharge: 2020-05-01 | Disposition: A | Discharge status: Home / Self Care | Payer: 59 | Attending: Internal Medicine | Admitting: Internal Medicine

## 2020-04-30 DIAGNOSIS — Z20822 Contact with and (suspected) exposure to covid-19: Secondary | ICD-10-CM | POA: Insufficient documentation

## 2020-04-30 DIAGNOSIS — E46 Unspecified protein-calorie malnutrition: Secondary | ICD-10-CM | POA: Diagnosis not present

## 2020-04-30 DIAGNOSIS — M7989 Other specified soft tissue disorders: Secondary | ICD-10-CM | POA: Diagnosis present

## 2020-04-30 DIAGNOSIS — F17210 Nicotine dependence, cigarettes, uncomplicated: Secondary | ICD-10-CM | POA: Diagnosis not present

## 2020-04-30 DIAGNOSIS — E782 Mixed hyperlipidemia: Secondary | ICD-10-CM

## 2020-04-30 DIAGNOSIS — A419 Sepsis, unspecified organism: Secondary | ICD-10-CM | POA: Diagnosis present

## 2020-04-30 DIAGNOSIS — N179 Acute kidney failure, unspecified: Secondary | ICD-10-CM | POA: Diagnosis not present

## 2020-04-30 DIAGNOSIS — Z79899 Other long term (current) drug therapy: Secondary | ICD-10-CM | POA: Diagnosis not present

## 2020-04-30 DIAGNOSIS — E1010 Type 1 diabetes mellitus with ketoacidosis without coma: Secondary | ICD-10-CM

## 2020-04-30 DIAGNOSIS — F319 Bipolar disorder, unspecified: Secondary | ICD-10-CM | POA: Diagnosis not present

## 2020-04-30 DIAGNOSIS — J410 Simple chronic bronchitis: Secondary | ICD-10-CM | POA: Diagnosis not present

## 2020-04-30 DIAGNOSIS — E785 Hyperlipidemia, unspecified: Secondary | ICD-10-CM | POA: Diagnosis present

## 2020-04-30 DIAGNOSIS — E109 Type 1 diabetes mellitus without complications: Secondary | ICD-10-CM | POA: Diagnosis present

## 2020-04-30 DIAGNOSIS — E43 Unspecified severe protein-calorie malnutrition: Secondary | ICD-10-CM | POA: Diagnosis present

## 2020-04-30 DIAGNOSIS — J449 Chronic obstructive pulmonary disease, unspecified: Secondary | ICD-10-CM | POA: Diagnosis not present

## 2020-04-30 DIAGNOSIS — F1411 Cocaine abuse, in remission: Secondary | ICD-10-CM | POA: Diagnosis present

## 2020-04-30 DIAGNOSIS — R079 Chest pain, unspecified: Secondary | ICD-10-CM | POA: Diagnosis present

## 2020-04-30 DIAGNOSIS — I10 Essential (primary) hypertension: Secondary | ICD-10-CM | POA: Diagnosis not present

## 2020-04-30 DIAGNOSIS — E440 Moderate protein-calorie malnutrition: Secondary | ICD-10-CM

## 2020-04-30 DIAGNOSIS — E1110 Type 2 diabetes mellitus with ketoacidosis without coma: Secondary | ICD-10-CM | POA: Diagnosis present

## 2020-04-30 DIAGNOSIS — E1065 Type 1 diabetes mellitus with hyperglycemia: Secondary | ICD-10-CM

## 2020-04-30 LAB — CBC WITH DIFFERENTIAL/PLATELET
Abs Immature Granulocytes: 0.03 10*3/uL (ref 0.00–0.07)
Basophils Absolute: 0 10*3/uL (ref 0.0–0.1)
Basophils Relative: 1 %
Eosinophils Absolute: 0.1 10*3/uL (ref 0.0–0.5)
Eosinophils Relative: 2 %
HCT: 42.8 % (ref 36.0–46.0)
Hemoglobin: 14.7 g/dL (ref 12.0–15.0)
Immature Granulocytes: 1 %
Lymphocytes Relative: 31 %
Lymphs Abs: 1.8 10*3/uL (ref 0.7–4.0)
MCH: 32.7 pg (ref 26.0–34.0)
MCHC: 34.3 g/dL (ref 30.0–36.0)
MCV: 95.1 fL (ref 80.0–100.0)
Monocytes Absolute: 0.2 10*3/uL (ref 0.1–1.0)
Monocytes Relative: 3 %
Neutro Abs: 3.7 10*3/uL (ref 1.7–7.7)
Neutrophils Relative %: 62 %
Platelets: 319 10*3/uL (ref 150–400)
RBC: 4.5 MIL/uL (ref 3.87–5.11)
RDW: 12.4 % (ref 11.5–15.5)
WBC: 5.8 10*3/uL (ref 4.0–10.5)
nRBC: 0 % (ref 0.0–0.2)

## 2020-04-30 LAB — CBG MONITORING, ED
Glucose-Capillary: 468 mg/dL — ABNORMAL HIGH (ref 70–99)
Glucose-Capillary: 559 mg/dL (ref 70–99)
Glucose-Capillary: 451 mg/dL — ABNORMAL HIGH (ref 70–99)
Glucose-Capillary: 371 mg/dL — ABNORMAL HIGH (ref 70–99)
Glucose-Capillary: 310 mg/dL — ABNORMAL HIGH (ref 70–99)
Glucose-Capillary: 197 mg/dL — ABNORMAL HIGH (ref 70–99)

## 2020-04-30 LAB — COMPREHENSIVE METABOLIC PANEL
ALT: 21 U/L (ref 0–44)
AST: 26 U/L (ref 15–41)
Albumin: 3.9 g/dL (ref 3.5–5.0)
Alkaline Phosphatase: 159 U/L — ABNORMAL HIGH (ref 38–126)
Anion gap: 22 — ABNORMAL HIGH (ref 5–15)
BUN: 46 mg/dL — ABNORMAL HIGH (ref 8–23)
CO2: 19 mmol/L — ABNORMAL LOW (ref 22–32)
Calcium: 10.3 mg/dL (ref 8.9–10.3)
Chloride: 91 mmol/L — ABNORMAL LOW (ref 98–111)
Creatinine, Ser: 1.64 mg/dL — ABNORMAL HIGH (ref 0.44–1.00)
GFR, Estimated: 35 mL/min — ABNORMAL LOW (ref >60)
Glucose, Bld: 578 mg/dL (ref 70–99)
Potassium: 4.9 mmol/L (ref 3.5–5.1)
Sodium: 132 mmol/L — ABNORMAL LOW (ref 135–145)
Total Bilirubin: 1.7 mg/dL — ABNORMAL HIGH (ref 0.3–1.2)
Total Protein: 7.9 g/dL (ref 6.5–8.1)

## 2020-04-30 LAB — BASIC METABOLIC PANEL
Anion gap: 15 (ref 5–15)
Anion gap: 9 (ref 5–15)
Anion gap: 9 (ref 5–15)
BUN: 44 mg/dL — ABNORMAL HIGH (ref 8–23)
BUN: 37 mg/dL — ABNORMAL HIGH (ref 8–23)
BUN: 35 mg/dL — ABNORMAL HIGH (ref 8–23)
CO2: 21 mmol/L — ABNORMAL LOW (ref 22–32)
CO2: 28 mmol/L (ref 22–32)
CO2: 29 mmol/L (ref 22–32)
Calcium: 9.8 mg/dL (ref 8.9–10.3)
Calcium: 9.8 mg/dL (ref 8.9–10.3)
Calcium: 9.8 mg/dL (ref 8.9–10.3)
Chloride: 99 mmol/L (ref 98–111)
Chloride: 101 mmol/L (ref 98–111)
Chloride: 101 mmol/L (ref 98–111)
Creatinine, Ser: 1.49 mg/dL — ABNORMAL HIGH (ref 0.44–1.00)
Creatinine, Ser: 1.11 mg/dL — ABNORMAL HIGH (ref 0.44–1.00)
Creatinine, Ser: 1.18 mg/dL — ABNORMAL HIGH (ref 0.44–1.00)
GFR, Estimated: 39 mL/min — ABNORMAL LOW (ref >60)
GFR, Estimated: 56 mL/min — ABNORMAL LOW (ref >60)
GFR, Estimated: 52 mL/min — ABNORMAL LOW (ref >60)
Glucose, Bld: 381 mg/dL — ABNORMAL HIGH (ref 70–99)
Glucose, Bld: 185 mg/dL — ABNORMAL HIGH (ref 70–99)
Glucose, Bld: 116 mg/dL — ABNORMAL HIGH (ref 70–99)
Potassium: 4.6 mmol/L (ref 3.5–5.1)
Potassium: 4.6 mmol/L (ref 3.5–5.1)
Potassium: 4.3 mmol/L (ref 3.5–5.1)
Sodium: 135 mmol/L (ref 135–145)
Sodium: 138 mmol/L (ref 135–145)
Sodium: 139 mmol/L (ref 135–145)

## 2020-04-30 LAB — MRSA PCR SCREENING: MRSA by PCR: NEGATIVE

## 2020-04-30 LAB — LIPASE, BLOOD: Lipase: 25 U/L (ref 11–51)

## 2020-04-30 LAB — TROPONIN I (HIGH SENSITIVITY)
Troponin I (High Sensitivity): 29 ng/L — ABNORMAL HIGH (ref <18)
Troponin I (High Sensitivity): 22 ng/L — ABNORMAL HIGH (ref <18)

## 2020-04-30 LAB — RESP PANEL BY RT-PCR (FLU A&B, COVID) ARPGX2
Influenza A by PCR: NEGATIVE
Influenza B by PCR: NEGATIVE
SARS Coronavirus 2 by RT PCR: NEGATIVE

## 2020-04-30 LAB — MAGNESIUM: Magnesium: 1.9 mg/dL (ref 1.7–2.4)

## 2020-04-30 LAB — GLUCOSE, CAPILLARY
Glucose-Capillary: 187 mg/dL — ABNORMAL HIGH (ref 70–99)
Glucose-Capillary: 200 mg/dL — ABNORMAL HIGH (ref 70–99)
Glucose-Capillary: 91 mg/dL (ref 70–99)
Glucose-Capillary: 157 mg/dL — ABNORMAL HIGH (ref 70–99)
Glucose-Capillary: 167 mg/dL — ABNORMAL HIGH (ref 70–99)
Glucose-Capillary: 133 mg/dL — ABNORMAL HIGH (ref 70–99)

## 2020-04-30 LAB — BETA-HYDROXYBUTYRIC ACID
Beta-Hydroxybutyric Acid: >8 mmol/L — ABNORMAL HIGH (ref 0.05–0.27)
Beta-Hydroxybutyric Acid: 4.46 mmol/L — ABNORMAL HIGH (ref 0.05–0.27)

## 2020-04-30 LAB — PHOSPHORUS: Phosphorus: 2.6 mg/dL (ref 2.5–4.6)

## 2020-04-30 MED ORDER — LACTATED RINGERS IV SOLN: INTRAVENOUS | Status: DC

## 2020-04-30 MED ORDER — DEXTROSE 50 % IV SOLN: 0.0000 mL | INTRAVENOUS | Status: DC | PRN

## 2020-04-30 MED ORDER — ALBUTEROL SULFATE HFA 108 (90 BASE) MCG/ACT IN AERS
2.0000 | INHALATION_SPRAY | Freq: Four times a day (QID) | RESPIRATORY_TRACT | Status: DC | PRN
  Filled 2020-04-30: qty 6.7

## 2020-04-30 MED ORDER — LACTATED RINGERS IV BOLUS
20.0000 mL/kg | Freq: Once | INTRAVENOUS | Status: AC
  Administered 2020-04-30: 1000 mL via INTRAVENOUS

## 2020-04-30 MED ORDER — DEXTROSE IN LACTATED RINGERS 5 % IV SOLN: INTRAVENOUS | Status: DC

## 2020-04-30 MED ORDER — AMLODIPINE BESYLATE 10 MG PO TABS
10.0000 mg | ORAL_TABLET | Freq: Every day | ORAL | Status: DC
  Administered 2020-05-01: 10 mg via ORAL
  Filled 2020-04-30: qty 1

## 2020-04-30 MED ORDER — INSULIN ASPART 100 UNIT/ML ~~LOC~~ SOLN: 0.0000 [IU] | Freq: Every day | SUBCUTANEOUS | Status: DC

## 2020-04-30 MED ORDER — DIPHENHYDRAMINE HCL 50 MG/ML IJ SOLN
12.5000 mg | Freq: Once | INTRAMUSCULAR | Status: AC
  Administered 2020-04-30: 12.5 mg via INTRAVENOUS
  Filled 2020-04-30: qty 1

## 2020-04-30 MED ORDER — INSULIN GLARGINE 100 UNIT/ML ~~LOC~~ SOLN
25.0000 [IU] | Freq: Every day | SUBCUTANEOUS | Status: DC
  Filled 2020-04-30 (×2): qty 0.25

## 2020-04-30 MED ORDER — SODIUM CHLORIDE 0.9 % IV SOLN
12.5000 mg | Freq: Four times a day (QID) | INTRAVENOUS | Status: DC | PRN
  Filled 2020-04-30: qty 0.5

## 2020-04-30 MED ORDER — HALOPERIDOL LACTATE 5 MG/ML IJ SOLN: 2.0000 mg | Freq: Once | INTRAMUSCULAR | Status: DC

## 2020-04-30 MED ORDER — TRAMADOL HCL 50 MG PO TABS: 50.0000 mg | ORAL_TABLET | Freq: Four times a day (QID) | ORAL | Status: DC | PRN

## 2020-04-30 MED ORDER — ATORVASTATIN CALCIUM 20 MG PO TABS
40.0000 mg | ORAL_TABLET | Freq: Every day | ORAL | Status: DC
  Administered 2020-04-30: 40 mg via ORAL
  Filled 2020-04-30: qty 2

## 2020-04-30 MED ORDER — PREGABALIN 50 MG PO CAPS
50.0000 mg | ORAL_CAPSULE | Freq: Two times a day (BID) | ORAL | Status: DC
  Administered 2020-04-30 – 2020-05-01 (×2): 50 mg via ORAL
  Filled 2020-04-30: qty 1
  Filled 2020-04-30: qty 2

## 2020-04-30 MED ORDER — PANTOPRAZOLE SODIUM 40 MG PO TBEC
80.0000 mg | DELAYED_RELEASE_TABLET | Freq: Every day | ORAL | Status: DC
  Administered 2020-05-01: 09:00:00 80 mg via ORAL
  Filled 2020-04-30: qty 2

## 2020-04-30 MED ORDER — INSULIN ASPART 100 UNIT/ML ~~LOC~~ SOLN: 0.0000 [IU] | Freq: Three times a day (TID) | SUBCUTANEOUS | Status: DC

## 2020-04-30 MED ORDER — HALOPERIDOL LACTATE 5 MG/ML IJ SOLN
2.5000 mg | Freq: Once | INTRAMUSCULAR | Status: AC
  Administered 2020-04-30: 2.5 mg via INTRAVENOUS
  Filled 2020-04-30: qty 1

## 2020-04-30 MED ORDER — DOCUSATE SODIUM 100 MG PO CAPS
100.0000 mg | ORAL_CAPSULE | Freq: Two times a day (BID) | ORAL | Status: DC
  Administered 2020-04-30 – 2020-05-01 (×2): 100 mg via ORAL
  Filled 2020-04-30 (×2): qty 1

## 2020-04-30 MED ORDER — INSULIN GLARGINE 100 UNIT/ML ~~LOC~~ SOLN
20.0000 [IU] | Freq: Every day | SUBCUTANEOUS | Status: DC
  Administered 2020-04-30: 20 [IU] via SUBCUTANEOUS
  Filled 2020-04-30 (×3): qty 0.2

## 2020-04-30 MED ORDER — ACETAMINOPHEN 325 MG PO TABS: 650.0000 mg | ORAL_TABLET | Freq: Four times a day (QID) | ORAL | Status: DC | PRN

## 2020-04-30 MED ORDER — HYDRALAZINE HCL 10 MG PO TABS
10.0000 mg | ORAL_TABLET | Freq: Three times a day (TID) | ORAL | Status: DC | PRN
  Filled 2020-04-30: qty 1

## 2020-04-30 MED ORDER — INSULIN REGULAR(HUMAN) IN NACL 100-0.9 UT/100ML-% IV SOLN
INTRAVENOUS | Status: DC
  Administered 2020-04-30: 7.5 [IU]/h via INTRAVENOUS
  Filled 2020-04-30: qty 100

## 2020-04-30 MED ORDER — LOSARTAN POTASSIUM 50 MG PO TABS
50.0000 mg | ORAL_TABLET | Freq: Every day | ORAL | Status: DC
  Administered 2020-05-01: 09:00:00 50 mg via ORAL
  Filled 2020-04-30: qty 1

## 2020-04-30 MED ORDER — POTASSIUM CHLORIDE 10 MEQ/100ML IV SOLN
10.0000 meq | INTRAVENOUS | Status: AC
  Administered 2020-04-30 (×2): 10 meq via INTRAVENOUS
  Filled 2020-04-30 (×2): qty 100

## 2020-04-30 MED ORDER — ENOXAPARIN SODIUM 40 MG/0.4ML ~~LOC~~ SOLN
40.0000 mg | SUBCUTANEOUS | Status: DC
  Administered 2020-04-30: 40 mg via SUBCUTANEOUS
  Filled 2020-04-30: qty 0.4

## 2020-04-30 MED ORDER — ONDANSETRON HCL 4 MG/2ML IJ SOLN: 4.0000 mg | Freq: Four times a day (QID) | INTRAMUSCULAR | Status: DC | PRN

## 2020-04-30 MED ORDER — LACTATED RINGERS IV BOLUS
1000.0000 mL | Freq: Once | INTRAVENOUS | Status: AC
  Administered 2020-04-30: 1000 mL via INTRAVENOUS

## 2020-04-30 NOTE — Progress Notes (Signed)
Inpatient Diabetes Program Recommendations  AACE/ADA: New Consensus Statement on Inpatient Glycemic Control   Target Ranges:  Prepandial:   less than 140 mg/dL      Peak postprandial:   less than 180 mg/dL (1-2 hours)      Critically ill patients:  140 - 180 mg/dL   Results for Erica Mercer, Erica Mercer (MRN [id number]) as of 04/30/2020 11:08  Ref. Range 04/30/2020 08:30  Beta-Hydroxybutyric Acid Latest Ref Range: 0.05 - 0.27 mmol/L >8.00 (H)  Glucose Latest Ref Range: 70 - 99 mg/dL 578 (HH)    Ref. Range 04/30/2020 08:30  CO2 Latest Ref Range: 22 - 32 mmol/L 19 (L)  Anion gap Latest Ref Range: 5 - 15  22 (H)  Results for Erica Mercer, Erica Mercer (MRN [id number]) as of 04/30/2020 11:08  Ref. Range 02/25/2020 12:45  Hemoglobin A1C Latest Ref Range: 4.8 - 5.6 % 14.4 (H)   Review of Glycemic Control  Diabetes history: DM1 (makes NO insulin; requires basal, correction, and carbohydrate coverage insulin) Outpatient Diabetes medications: Lantus 25 units daily, Humalog 6 units TID with meals Current orders for Inpatient glycemic control: IV insulin   Inpatient Diabetes Program Recommendations:    Insulin: IV insulin should be continued until acidosis is completely resolved. Once acidosis resolved and provider ready to transition from IV to SQ insulin, please consider ordering Lantus 20 units Q24H, CBGS Q4H, Novolog 0-9 units Q4H.   HbgA1C:  A1C 14.4% on 02/25/20 and noted in Care Everywhere that A1C was 13.8% on 04/17/20 when patient last seen PCP.  NOTE: Noted patient in ED with hyperglycemia, nausea, vomiting for several days. IV insulin has been ordered. Noted patient was inpatient 02/25/20-02/28/20 and seen by inpatient diabetes coordinator on 02/27/20. Patient was discharged on Lantus 25 units daily and Humalog 6 units TID with meals.  Will follow along while inpatient.  Thanks, Barnie Alderman, RN, MSN, CDE Diabetes Coordinator Inpatient Diabetes Program [phone] (Team Pager from 8am to 5pm)

## 2020-04-30 NOTE — ED Notes (Addendum)
Critical glucose 575. Ellender Hose, MD aware.

## 2020-04-30 NOTE — ED Triage Notes (Signed)
Patient arrives via [facility] from home for N/V for the past several weeks. She is also complaining of CP upon coughing and palpation. Patient cbg read "HI" for EMS. Patinet AOx4 at this time.

## 2020-04-30 NOTE — ED Notes (Signed)
Informed RN bed assigned 1353

## 2020-04-30 NOTE — H&P (Addendum)
History and Physical   Erica Mercer WER:[id number] DOB: Oct 13, 1955 DOA: 04/30/2020  PCP: [name], NP  Patient coming from: home  I have personally briefly reviewed patient's old medical records in [place].  Chief Concern: Nausea and vomiting  HPI: Erica Mercer is a 65 y.o. female with medical history significant for insulin-dependent diabetes mellitus type 1, hypertension, hyperlipidemia, history of polysubstance abuse, tobacco abuse, presents to emergency department for chief concerns of nausea and vomiting and weakness.  She also endorses fatigue.  She states that this has been going on for about 4 days.  She reports she has been having worsening nausea and vomiting.  She denies sick contacts.  She denies fever, shortness of breath, chest pain, dysuria, hematuria, syncope.  She reports that she does not have difficulty acquiring insulin.  She states that she thinks she has been compliant with her insulin.  She endorses that in the last week she has been having polyuria and polydipsia.  However she states that she has been nauseous and vomiting.  Social history: She is disabled.  She lives at home with her daughter.  She endorses using tobacco daily, about 7 to 10 cigarettes/day.  She denies alcohol use.  She denies recreational drug use.  Vaccination: Patient is vaccinated for COVID-19, 2 doses  ROS: Constitutional: no weight change, no fever ENT/Mouth: no sore throat, no rhinorrhea Eyes: no eye pain, no vision changes Cardiovascular: no chest pain, no dyspnea,  no edema, no palpitations Respiratory: no cough, no sputum, no wheezing Gastrointestinal: + nausea, + vomiting, no diarrhea, no constipation Genitourinary: no urinary incontinence, no dysuria, no hematuria Musculoskeletal: no arthralgias, no myalgias Skin: no skin lesions, no pruritus, Neuro: + weakness, no loss of consciousness, no syncope Psych: no anxiety, no depression, + decrease  appetite Heme/Lymph: no bruising, no bleeding  ED Course: Discussed with ED provider.  Patient requiring hospitalization due to diabetic ketoacidosis.  Vitals in the emergency department show temperature of 98.1, respiration rate of 16, heart rate of 95, blood pressure 154/88, patient saturating at 98% on room air.  Labs in the emergency department was remarkable for anion gap of 22, serum creatinine 1.64, sodium 132, potassium 4.9, bicarb 19, troponin high-sensitivity initially 29 and decreased to 22.  ED provider started patient on insulin GTT and gave 2 doses of potassium IV 10 mEq.  Assessment/Plan  Principal Problem:   DKA (diabetic ketoacidosis) ([place]) Active Problems:   Malnutrition ([place])   Essential hypertension   HLD (hyperlipidemia)   Soft tissue mass   COPD (chronic obstructive pulmonary disease) (H[facility])   Bipolar 1 disorder ([facility])   H/O cocaine abuse ([place])   AKI (acute kidney injury) ([place])   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[place])   # DKA-etiology work-up in progress I suspect this is secondary to noncompliance as patient says she thinks that she takes her insulin -Continue insulin GTT via Endo tool -Continue aggressive fluid replacement -BMP every 4 hours -beta hydroxybutyrate every 8 hours -Admit to stepdown with telemetry -Telemetry scheduled for discontinuation in 2 days -N.p.o.  # Hypertension-resumed home antihypertensive -Resumed amlodipine 10 mg daily -Losartan resumed for 05/01/2020 as patient currently has DKA  # Acute kidney injury-suspect prerenal secondary to poor p.o. intake and noncompliance with insulin management -No baseline CKD -BMP in the a.m. -Holding home losartan -Fluid resuscitation as above  # Hyperlipidemia-resumed atorvastatin 40 mg nightly  # Peripheral neuropathy-resumed pregabalin 50 mg twice daily  # GERD-PPI  # Nausea/vomiting-status post 1 dose  Haldol per ED provider with good results of nausea and vomiting  symptoms -Ondansetron 4 mg IV every 6 hours.  For nausea and vomiting, Phenergan 12.5 mg IV every 6 hours as needed for refractory nausea and vomiting, 2 doses ordered  # Right axillary soft tissue mass-patient is aware and states that the size has been decreasing over the years  # Tobacco use - Patient endorses readiness to stop tobacco use.  Tobacco cessation counseling:  1. Week one, smoke 6 cigarettes per day. Week two, smoke 5 cigarettes per day. Week three, smoke 4 cigarettes per day, continue until smoking half the amount of cigarettes per day 2. Discuss with PCP for pharmacologic assistance with smoking cessation 3. Clean all indoor clothing, sheets, blankets, and freshen textile furniture to rid the smell of cigarettes 4. Only smoke outside and wear outer covering 5. Leave cigarettes and lighters outside in separate places 6. Avoid prolong interactions with individuals actively smoking cigarettes 7. If you smoke in social setting, avoid social setting where cigarette smoking is expected or considered acceptable  8. Call [facility] if in need of nicotine patches to help with cessation  # Covid testing pending  Chart reviewed.   DVT prophylaxis: Enoxaparin 40 mg subcutaneous every 24 hours Code Status: Full code Diet: N.p.o. Family Communication: No Disposition Plan: Pending clinical course Consults called: None at this time Admission status: Observation, telemetry, stepdown  Past Medical History:  Diagnosis Date  . Diabetes mellitus without complication ([place])    History reviewed. No pertinent surgical history.  Social History:  reports that she has been smoking. She has never used smokeless tobacco. She reports previous alcohol use. She reports current drug use. Frequency: 3.00 times per week. Drug: Marijuana.  No Known Allergies No family history on file. Family history: Family history reviewed and not pertinent  Prior to Admission medications   Medication Sig  Start Date End Date Taking? Authorizing Provider  albuterol (VENTOLIN HFA) 108 (90 Base) MCG/ACT inhaler Inhale 2 puffs into the lungs every 6 (six) hours as needed for wheezing. 02/21/20 02/15/21  [provider]  amLODipine (NORVASC) 10 MG tablet Take 10 mg by mouth daily. 02/21/20 02/15/21  [provider]  atorvastatin (LIPITOR) 40 MG tablet Take 40 mg by mouth at bedtime. 02/21/20 02/15/21  [provider]  docusate sodium (COLACE) 100 MG capsule Take 1 capsule (100 mg total) by mouth 2 (two) times daily. 03/17/20 06/15/20  Harvest Dark, MD  insulin glargine (LANTUS SOLOSTAR) 100 UNIT/ML Solostar Pen Inject 25 Units into the skin daily. 02/27/20 02/21/21  Wouk, Ailene Rud, MD  insulin lispro (HUMALOG) 100 UNIT/ML KwikPen Inject 6 Units into the skin 3 (three) times daily with meals. 11/09/19   [provider]  losartan (COZAAR) 50 MG tablet Take 50 mg by mouth daily. 02/21/20 02/15/21  [provider]  omeprazole (PRILOSEC) 40 MG capsule Take 40 mg by mouth daily. 02/21/20 05/21/20  [provider]  ondansetron (ZOFRAN ODT) 4 MG disintegrating tablet Take 1 tablet (4 mg total) by mouth every 6 (six) hours as needed for nausea or vomiting. 04/25/20   Delman Kitten, MD  pregabalin (LYRICA) 50 MG capsule Take 50 mg by mouth 2 (two) times daily. 11/09/19   [provider]  traMADol (ULTRAM) 50 MG tablet Take 1 tablet (50 mg total) by mouth every 6 (six) hours as needed. 03/17/20   Harvest Dark, MD   Physical Exam: Vitals:   04/30/20 0930 04/30/20 1000 04/30/20 1030 04/30/20  1130  BP: (!) 178/86 (!) 180/94 (!) 162/92 (!) 154/88  Pulse: 97 96 95 98  Resp: 12 13 13 17   Temp:      TempSrc:      SpO2: 99% 97% 97% 98%  Weight:      Height:       Constitutional: appears older than chronological age, frail, cachectic, NAD, calm, comfortable Eyes: PERRL, lids and conjunctivae normal ENMT: Mucous membranes are moist. Posterior pharynx clear  of any exudate or lesions. poor dentition with multiple teeth loss. Hearing appropriate Neck: normal, supple, no masses, no thyromegaly Respiratory: clear to auscultation bilaterally, no wheezing, no crackles. Normal respiratory effort. No accessory muscle use.  Cardiovascular: Regular rate and rhythm, no murmurs / rubs / gallops. No extremity edema. 2+ pedal pulses. No carotid bruits.  Abdomen: no tenderness, no masses palpated, no hepatosplenomegaly. Bowel sounds positive.  Musculoskeletal: no clubbing / cyanosis. No joint deformity upper and lower extremities. Good ROM, no contractures, no atrophy. Normal muscle tone.  Skin: no rashes, lesions, ulcers. No induration Neurologic: Sensation intact. Strength 5/5 in all 4.  Psychiatric: Normal judgment and insight. Alert and oriented x 3. Normal mood.   EKG: independently reviewed, showing sinus tachycardia with rate of 104, QTc 464  Chest x-ray on Admission: I personally reviewed and I agree with radiologist reading as below.  DG Chest Portable 1 View  Result Date: 04/30/2020 CLINICAL DATA:  Chest pain Nausea Vomiting EXAM: PORTABLE CHEST 1 VIEW COMPARISON:  None. FINDINGS: Unchanged mild cardiomegaly. No significant pulmonary vascular congestion. Lungs are clear. Redemonstration of rounded calcification in the right axilla. IMPRESSION: 1. Unchanged cardiomegaly. 2. Redemonstration of rounded calcification in the right axilla. Correlation with physical examination again recommended as this could represent a breast or other soft tissue tumor. Electronically Signed   By: Miachel Roux M.D.   On: 04/30/2020 09:55   Labs on Admission: I have personally reviewed following labs  CBC: Recent Labs  Lab 04/25/20 1508 04/30/20 0830  WBC 4.9 5.8  NEUTROABS 2.3 3.7  HGB 12.7 14.7  HCT 37.0 42.8  MCV 95.1 95.1  PLT 282 967   Basic Metabolic Panel: Recent Labs  Lab 04/25/20 1508 04/30/20 0830  NA 136 132*  K 4.0 4.9  CL 97* 91*  CO2 28 19*   GLUCOSE 331* 578*  BUN 43* 46*  CREATININE 1.42* 1.64*  CALCIUM 9.9 10.3   GFR: Estimated Creatinine Clearance: 27.4 mL/min (A) (by C-G formula based on SCr of 1.64 mg/dL (H)).  Liver Function Tests: Recent Labs  Lab 04/25/20 1508 04/30/20 0830  AST 21 26  ALT 21 21  ALKPHOS 138* 159*  BILITOT 0.7 1.7*  PROT 6.3* 7.9  ALBUMIN 3.2* 3.9   Recent Labs  Lab 04/30/20 0830  LIPASE 25   CBG: Recent Labs  Lab 04/25/20 1434 04/25/20 1932 04/30/20 1050 04/30/20 1126 04/30/20 1203  GLUCAP 357* 164* 559* 468* 451*   Urine analysis:    Component Value Date/Time   COLORURINE YELLOW (A) 04/25/2020 1348   APPEARANCEUR HAZY (A) 04/25/2020 1348   LABSPEC 1.023 04/25/2020 1348   PHURINE 5.0 04/25/2020 1348   GLUCOSEU >=500 (A) 04/25/2020 1348   HGBUR NEGATIVE 04/25/2020 1348   BILIRUBINUR NEGATIVE 04/25/2020 1348   KETONESUR 20 (A) 04/25/2020 1348   PROTEINUR >=300 (A) 04/25/2020 1348   NITRITE NEGATIVE 04/25/2020 1348   LEUKOCYTESUR NEGATIVE 04/25/2020 1348   Darey Hershberger N Emauri Krygier D.O. [facility]  If 7PM-7AM, please contact overnight-coverage provider If 7AM-7PM, please  contact day coverage provider [email or web address]  04/30/2020, 12:51 PM

## 2020-04-30 NOTE — ED Provider Notes (Addendum)
[facility] Emergency Department Provider Note  ____________________________________________   Event Date/Time   First MD Initiated Contact with Patient 04/30/20 [phone]     (approximate)  I have reviewed the triage vital signs and the nursing notes.   HISTORY  Chief Complaint Chest Pain    HPI Erica Mercer is a 65 y.o. female with history of polysubstance abuse, DKA and poorly controlled diabetes, here with shortness of breath, nausea, vomiting, fatigue.  The patient states that for the last 3 to 4 days, she has had constant and progressively worsening nausea, vomiting.  She said associated polyuria and polydipsia.  She feels like even though she drinks all the time she continually feels dehydrated.  She has had dry mouth.  She states that she has been essentially unable to eat or drink for the last 48 hours.  She said associated mild shortness of breath.  She has a history of DKA and states it feels similar to that.  Denies known specific triggers.  Denies recent dietary discretion.  She does admit that she occasionally does not take her insulin as she should.  Denies any recent drug use.  Denies any fevers or chills.  She has some mild chest pain after vomiting, but this resolves.  Her abdominal pain is generalized but primarily epigastric.  It is aching and gnawing.  Worse with eating and drinking.  No alleviating factors.        Past Medical History:  Diagnosis Date  . Diabetes mellitus without complication [facility])     Patient Active Problem List   Diagnosis Date Noted  . DKA, type 1 ([place]) 02/26/2020  . Acute renal failure ([place])   . DKA (diabetic ketoacidosis) ([place]) 02/25/2020  . Malnutrition ([place]) 02/25/2020  . Essential hypertension 02/25/2020  . HLD (hyperlipidemia) 02/25/2020  . Soft tissue mass 02/25/2020  . H/O cocaine abuse ([place]) 02/17/2018  . Bipolar 1 disorder ([place]) 04/22/2016  . Old myocardial infarction 04/22/2016  . COPD (chronic  obstructive pulmonary disease) ([place]) 01/22/2016    History reviewed. No pertinent surgical history.  Prior to Admission medications   Medication Sig Start Date End Date Taking? Authorizing Provider  albuterol (VENTOLIN HFA) 108 (90 Base) MCG/ACT inhaler Inhale 2 puffs into the lungs every 6 (six) hours as needed for wheezing. 02/21/20 02/15/21  [provider]  amLODipine (NORVASC) 10 MG tablet Take 10 mg by mouth daily. 02/21/20 02/15/21  [provider]  atorvastatin (LIPITOR) 40 MG tablet Take 40 mg by mouth at bedtime. 02/21/20 02/15/21  [provider]  docusate sodium (COLACE) 100 MG capsule Take 1 capsule (100 mg total) by mouth 2 (two) times daily. 03/17/20 06/15/20  Harvest Dark, MD  insulin glargine (LANTUS SOLOSTAR) 100 UNIT/ML Solostar Pen Inject 25 Units into the skin daily. 02/27/20 02/21/21  Wouk, Ailene Rud, MD  insulin lispro (HUMALOG) 100 UNIT/ML KwikPen Inject 6 Units into the skin 3 (three) times daily with meals. 11/09/19   [provider]  losartan (COZAAR) 50 MG tablet Take 50 mg by mouth daily. 02/21/20 02/15/21  [provider]  omeprazole (PRILOSEC) 40 MG capsule Take 40 mg by mouth daily. 02/21/20 05/21/20  [provider]  ondansetron (ZOFRAN ODT) 4 MG disintegrating tablet Take 1 tablet (4 mg total) by mouth every 6 (six) hours as needed for nausea or vomiting. 04/25/20   Delman Kitten, MD  pregabalin (LYRICA) 50 MG capsule Take 50 mg by mouth 2 (two) times daily. 11/09/19   [provider]  traMADol (ULTRAM) 50 MG tablet Take 1 tablet (50 mg total) by mouth every 6 (six) hours as needed. 03/17/20   Harvest Dark, MD    Allergies Patient has no known allergies.  No family history on file.  Social History Social History   Tobacco Use  . Smoking status: Current Every Day Smoker  . Smokeless tobacco: Never Used  Vaping Use  . Vaping Use: Never used  Substance Use Topics  . Alcohol use: Not Currently   . Drug use: Yes    Frequency: 3.0 times per week    Types: Marijuana    Review of Systems  Review of Systems  Constitutional: Positive for fatigue. Negative for fever.  HENT: Negative for congestion and sore throat.   Eyes: Negative for visual disturbance.  Respiratory: Positive for chest tightness and shortness of breath. Negative for cough.   Cardiovascular: Negative for chest pain.  Gastrointestinal: Positive for abdominal pain, nausea and vomiting. Negative for diarrhea.  Genitourinary: Negative for flank pain.  Musculoskeletal: Negative for back pain and neck pain.  Skin: Negative for rash and wound.  Neurological: Positive for weakness.  All other systems reviewed and are negative.    ____________________________________________  PHYSICAL EXAM:      VITAL SIGNS: ED Triage Vitals  Enc Vitals Group     BP --      Pulse --      Resp --      Temp --      Temp src --      SpO2 04/30/20 0822 99 %     Weight --      Height --      Head Circumference --      Peak Flow --      Pain Score 04/30/20 0825 7     Pain Loc --      Pain Edu? --      Excl. in [place]? --      Physical Exam Vitals and nursing note reviewed.  Constitutional:      General: She is not in acute distress.    Appearance: She is well-developed.  HENT:     Head: Normocephalic and atraumatic.     Comments: Mildly dry mucous membranes Eyes:     Conjunctiva/sclera: Conjunctivae normal.  Cardiovascular:     Rate and Rhythm: Regular rhythm. Tachycardia present.     Heart sounds: Normal heart sounds. No murmur heard. No friction rub.  Pulmonary:     Effort: Pulmonary effort is normal. No respiratory distress.     Breath sounds: Normal breath sounds. No wheezing or rales.  Abdominal:     General: There is no distension.     Palpations: Abdomen is soft.     Tenderness: There is no abdominal tenderness.  Musculoskeletal:     Cervical back: Neck supple.  Skin:    General: Skin is warm.      Capillary Refill: Capillary refill takes less than 2 seconds.  Neurological:     Mental Status: She is alert and oriented to person, place, and time.     Motor: No abnormal muscle tone.       ____________________________________________   LABS (all labs ordered are listed, but only abnormal results are displayed)  Labs Reviewed  COMPREHENSIVE METABOLIC PANEL - Abnormal; Notable for the following components:      Result Value   Sodium 132 (*)    Chloride 91 (*)    CO2 19 (*)    Glucose, Bld 578 (*)  BUN 46 (*)    Creatinine, Ser 1.64 (*)    Alkaline Phosphatase 159 (*)    Total Bilirubin 1.7 (*)    GFR, Estimated 35 (*)    Anion gap 22 (*)    All other components within normal limits  BLOOD GAS, VENOUS - Abnormal; Notable for the following components:   pCO2, Ven 43 (*)    Acid-base deficit 6.4 (*)    All other components within normal limits  BETA-HYDROXYBUTYRIC ACID - Abnormal; Notable for the following components:   Beta-Hydroxybutyric Acid >8.00 (*)    All other components within normal limits  CBG MONITORING, ED - Abnormal; Notable for the following components:   Glucose-Capillary 559 (*)    All other components within normal limits  CBG MONITORING, ED - Abnormal; Notable for the following components:   Glucose-Capillary 468 (*)    All other components within normal limits  CBG MONITORING, ED - Abnormal; Notable for the following components:   Glucose-Capillary 451 (*)    All other components within normal limits  TROPONIN I (HIGH SENSITIVITY) - Abnormal; Notable for the following components:   Troponin I (High Sensitivity) 29 (*)    All other components within normal limits  TROPONIN I (HIGH SENSITIVITY) - Abnormal; Notable for the following components:   Troponin I (High Sensitivity) 22 (*)    All other components within normal limits  CBC WITH DIFFERENTIAL/PLATELET  LIPASE, BLOOD  URINALYSIS, COMPLETE (UACMP) WITH MICROSCOPIC  URINE DRUG SCREEN,  QUALITATIVE ([facility] ONLY)    ____________________________________________  EKG: Sinus tachycardia, ventricular rate 104.  PR 146, QRS 85, QTc 469.  Nonspecific T wave inversions.  No acute ST elevations or depressions. ________________________________________  RADIOLOGY All imaging, including plain films, CT scans, and ultrasounds, independently reviewed by me, and interpretations confirmed via formal radiology reads.  ED MD interpretation:   CXR: Cardiomegaly, o/w unremarkable  Official radiology report(s): DG Chest Portable 1 View  Result Date: 04/30/2020 CLINICAL DATA:  Chest pain Nausea Vomiting EXAM: PORTABLE CHEST 1 VIEW COMPARISON:  None. FINDINGS: Unchanged mild cardiomegaly. No significant pulmonary vascular congestion. Lungs are clear. Redemonstration of rounded calcification in the right axilla. IMPRESSION: 1. Unchanged cardiomegaly. 2. Redemonstration of rounded calcification in the right axilla. Correlation with physical examination again recommended as this could represent a breast or other soft tissue tumor. Electronically Signed   By: Miachel Roux M.D.   On: 04/30/2020 09:55    ____________________________________________  PROCEDURES   Procedure(s) performed (including Critical Care):  .1-3 Lead EKG Interpretation Performed by: Duffy Bruce, MD Authorized by: Duffy Bruce, MD     Interpretation: non-specific     ECG rate:  90-110   ECG rate assessment: tachycardic     Rhythm: sinus rhythm     Ectopy: none     Conduction: normal   Comments:     Indication: Chest pain/DKA .Critical Care Performed by: Duffy Bruce, MD Authorized by: Duffy Bruce, MD   Critical care provider statement:    Critical care time (minutes):  35   Critical care time was exclusive of:  Separately billable procedures and treating other patients and teaching time   Critical care was necessary to treat or prevent imminent or life-threatening deterioration of the following  conditions:  Cardiac failure, circulatory failure, respiratory failure and metabolic crisis   Critical care was time spent personally by me on the following activities:  Development of treatment plan with patient or surrogate, discussions with consultants, evaluation of patient's response to treatment, examination of  patient, obtaining history from patient or surrogate, ordering and performing treatments and interventions, ordering and review of laboratory studies, ordering and review of radiographic studies, pulse oximetry, re-evaluation of patient's condition and review of old charts   I assumed direction of critical care for this patient from another provider in my specialty: no      ____________________________________________  INITIAL IMPRESSION / MDM / [place] / ED COURSE  As part of my medical decision making, I reviewed the following data within the [facility] notes reviewed and incorporated, Old chart reviewed, Notes from prior ED visits, and [place] Controlled Substance Database       *Olanda Boughner was evaluated in Emergency Department on 04/30/2020 for the symptoms described in the history of present illness. She was evaluated in the context of the global COVID-19 pandemic, which necessitated consideration that the patient might be at risk for infection with the SARS-CoV-2 virus that causes COVID-19. Institutional protocols and algorithms that pertain to the evaluation of patients at risk for COVID-19 are in a state of rapid change based on information released by regulatory bodies including the CDC and federal and state organizations. These policies and algorithms were followed during the patient's care in the ED.  Some ED evaluations and interventions may be delayed as a result of limited staffing during the pandemic.*     Medical Decision Making: 65 year old female here with recurrent nausea and vomiting.  Lab work shows recurrent DKA with bicarb 19,  glucose 578, AKI with creatinine of 1.6, and anion gap of 22.  But at rest speech rate is greater than 8.  pH 7.28.  Patient started on IV fluids, given antiemetics, and will start on insulin drip.  Chest x-ray clear.  Reports nausea and vomiting but this seems to be a recurrent issue and her abdomen is soft, do not feel CT imaging indicated at this time.  Fluids, potassium started.  Admit to stepdown.  ____________________________________________  FINAL CLINICAL IMPRESSION(S) / ED DIAGNOSES  Final diagnoses:  Diabetic ketoacidosis without coma associated with type 1 diabetes mellitus ([place])     MEDICATIONS GIVEN DURING THIS VISIT:  Medications  insulin regular, human (MYXREDLIN) 100 units/ 100 mL infusion (7 Units/hr Intravenous Rate/Dose Change 04/30/20 1204)  lactated ringers infusion ( Intravenous New Bag/Given 04/30/20 1058)  dextrose 5 % in lactated ringers infusion (0 mLs Intravenous Hold 04/30/20 1059)  dextrose 50 % solution 0-50 mL (has no administration in time range)  potassium chloride 10 mEq in 100 mL IVPB (10 mEq Intravenous New Bag/Given 04/30/20 1059)  lactated ringers bolus 1,000 mL (0 mLs Intravenous Stopped 04/30/20 1115)  haloperidol lactate (HALDOL) injection 2.5 mg (2.5 mg Intravenous Given 04/30/20 0841)  diphenhydrAMINE (BENADRYL) injection 12.5 mg (12.5 mg Intravenous Given 04/30/20 0841)     ED Discharge Orders    None       Note:  This document was prepared using Dragon voice recognition software and may include unintentional dictation errors.   Duffy Bruce, MD 04/30/20 1201    Duffy Bruce, MD 04/30/20 [phone]

## 2020-05-01 DIAGNOSIS — E1010 Type 1 diabetes mellitus with ketoacidosis without coma: Secondary | ICD-10-CM | POA: Diagnosis not present

## 2020-05-01 LAB — BLOOD GAS, VENOUS
Acid-base deficit: 6.4 mmol/L — ABNORMAL HIGH (ref 0.0–2.0)
Bicarbonate: 20.2 mmol/L (ref 20.0–28.0)
O2 Saturation: 47.8 %
Patient temperature: 37
pCO2, Ven: 43 mmHg — ABNORMAL LOW (ref 44.0–60.0)
pH, Ven: 7.28 (ref 7.250–7.430)

## 2020-05-01 LAB — BASIC METABOLIC PANEL
Anion gap: 6 (ref 5–15)
BUN: 37 mg/dL — ABNORMAL HIGH (ref 8–23)
CO2: 29 mmol/L (ref 22–32)
Calcium: 9.2 mg/dL (ref 8.9–10.3)
Chloride: 102 mmol/L (ref 98–111)
Creatinine, Ser: 1.26 mg/dL — ABNORMAL HIGH (ref 0.44–1.00)
GFR, Estimated: 48 mL/min — ABNORMAL LOW (ref >60)
Glucose, Bld: 96 mg/dL (ref 70–99)
Potassium: 4.6 mmol/L (ref 3.5–5.1)
Sodium: 137 mmol/L (ref 135–145)

## 2020-05-01 LAB — GLUCOSE, CAPILLARY
Glucose-Capillary: 83 mg/dL (ref 70–99)
Glucose-Capillary: 315 mg/dL — ABNORMAL HIGH (ref 70–99)
Glucose-Capillary: 77 mg/dL (ref 70–99)

## 2020-05-01 LAB — CBC
HCT: 32.2 % — ABNORMAL LOW (ref 36.0–46.0)
Hemoglobin: 11.2 g/dL — ABNORMAL LOW (ref 12.0–15.0)
MCH: 32.7 pg (ref 26.0–34.0)
MCHC: 34.8 g/dL (ref 30.0–36.0)
MCV: 94.2 fL (ref 80.0–100.0)
Platelets: 258 10*3/uL (ref 150–400)
RBC: 3.42 MIL/uL — ABNORMAL LOW (ref 3.87–5.11)
RDW: 12.4 % (ref 11.5–15.5)
WBC: 7.8 10*3/uL (ref 4.0–10.5)
nRBC: 0 % (ref 0.0–0.2)

## 2020-05-01 MED ORDER — INSULIN ASPART 100 UNIT/ML ~~LOC~~ SOLN
0.0000 [IU] | Freq: Three times a day (TID) | SUBCUTANEOUS | Status: DC
  Administered 2020-05-01: 7 [IU] via SUBCUTANEOUS
  Filled 2020-05-01: qty 1

## 2020-05-01 MED ORDER — INSULIN GLARGINE 100 UNIT/ML ~~LOC~~ SOLN
20.0000 [IU] | Freq: Every day | SUBCUTANEOUS | Status: DC
  Administered 2020-05-01: 10:00:00 20 [IU] via SUBCUTANEOUS
  Filled 2020-05-01 (×2): qty 0.2

## 2020-05-01 MED ORDER — INSULIN GLARGINE 100 UNIT/ML ~~LOC~~ SOLN
20.0000 [IU] | Freq: Every day | SUBCUTANEOUS | Status: DC
  Filled 2020-05-01: qty 0.2

## 2020-05-01 NOTE — Plan of Care (Signed)
  Problem: Education: Goal: Knowledge of General Education information will improve Description: Including pain rating scale, medication(s)/side effects and non-pharmacologic comfort measures 05/01/2020 0644 by Frazier Butt, RN Outcome: Progressing 05/01/2020 0643 by Frazier Butt, RN Outcome: Progressing

## 2020-05-01 NOTE — Plan of Care (Signed)
  Problem: Education: Goal: Knowledge of General Education information will improve Description Including pain rating scale, medication(s)/side effects and non-pharmacologic comfort measures Outcome: Progressing   

## 2020-05-01 NOTE — Progress Notes (Signed)
Went through discharge instruction with patient, verbalize understanding, left POV with family memeber

## 2020-05-01 NOTE — Care Management CC44 (Signed)
Condition Code 44 Documentation Completed  Patient Details  Name: Hetty Linhart MRN: [id number] Date of Birth: 26-Mar-1955   Condition Code 44 given:  Yes Patient signature on Condition Code 44 notice:  Yes Documentation of 2 MD's agreement:  Yes Code 44 added to claim:  Yes    Shelbie Hutching, RN 05/01/2020, 12:48 PM

## 2020-05-01 NOTE — Discharge Instructions (Signed)
Diabetes Mellitus and Nutrition, Adult When you have diabetes, or diabetes mellitus, it is very important to have healthy eating habits because your blood sugar (glucose) levels are greatly affected by what you eat and drink. Eating healthy foods in the right amounts, at about the same times every day, can help you:  Control your blood glucose.  Lower your risk of heart disease.  Improve your blood pressure.  Reach or maintain a healthy weight. What can affect my meal plan? Every person with diabetes is different, and each person has different needs for a meal plan. Your health care provider may recommend that you work with a dietitian to make a meal plan that is best for you. Your meal plan may vary depending on factors such as:  The calories you need.  The medicines you take.  Your weight.  Your blood glucose, blood pressure, and cholesterol levels.  Your activity level.  Other health conditions you have, such as heart or kidney disease. How do carbohydrates affect me? Carbohydrates, also called carbs, affect your blood glucose level more than any other type of food. Eating carbs naturally raises the amount of glucose in your blood. Carb counting is a method for keeping track of how many carbs you eat. Counting carbs is important to keep your blood glucose at a healthy level, especially if you use insulin or take certain oral diabetes medicines. It is important to know how many carbs you can safely have in each meal. This is different for every person. Your dietitian can help you calculate how many carbs you should have at each meal and for each snack. How does alcohol affect me? Alcohol can cause a sudden decrease in blood glucose (hypoglycemia), especially if you use insulin or take certain oral diabetes medicines. Hypoglycemia can be a life-threatening condition. Symptoms of hypoglycemia, such as sleepiness, dizziness, and confusion, are similar to symptoms of having too much  alcohol.  Do not drink alcohol if: ? Your health care provider tells you not to drink. ? You are pregnant, may be pregnant, or are planning to become pregnant.  If you drink alcohol: ? Do not drink on an empty stomach. ? Limit how much you use to:  0-1 drink a day for women.  0-2 drinks a day for men. ? Be aware of how much alcohol is in your drink. In the [place] one drink equals one 12 oz bottle of beer (355 mL), one 5 oz glass of wine (148 mL), or one 1 oz glass of hard liquor (44 mL). ? Keep yourself hydrated with water, diet soda, or unsweetened iced tea.  Keep in mind that regular soda, juice, and other mixers may contain a lot of sugar and must be counted as carbs. What are tips for following this plan? Reading food labels  Start by checking the serving size on the "Nutrition Facts" label of packaged foods and drinks. The amount of calories, carbs, fats, and other nutrients listed on the label is based on one serving of the item. Many items contain more than one serving per package.  Check the total grams (g) of carbs in one serving. You can calculate the number of servings of carbs in one serving by dividing the total carbs by 15. For example, if a food has 30 g of total carbs per serving, it would be equal to 2 servings of carbs.  Check the number of grams (g) of saturated fats and trans fats in one serving. Choose foods that have   a low amount or none of these fats.  Check the number of milligrams (mg) of salt (sodium) in one serving. Most people should limit total sodium intake to less than 2,300 mg per day.  Always check the nutrition information of foods labeled as "low-fat" or "nonfat." These foods may be higher in added sugar or refined carbs and should be avoided.  Talk to your dietitian to identify your daily goals for nutrients listed on the label. Shopping  Avoid buying canned, pre-made, or processed foods. These foods tend to be high in fat, sodium, and added  sugar.  Shop around the outside edge of the grocery store. This is where you will most often find fresh fruits and vegetables, bulk grains, fresh meats, and fresh dairy. Cooking  Use low-heat cooking methods, such as baking, instead of high-heat cooking methods like deep frying.  Cook using healthy oils, such as olive, canola, or sunflower oil.  Avoid cooking with butter, cream, or high-fat meats. Meal planning  Eat meals and snacks regularly, preferably at the same times every day. Avoid going long periods of time without eating.  Eat foods that are high in fiber, such as fresh fruits, vegetables, beans, and whole grains. Talk with your dietitian about how many servings of carbs you can eat at each meal.  Eat 4-6 oz (112-168 g) of lean protein each day, such as lean meat, chicken, fish, eggs, or tofu. One ounce (oz) of lean protein is equal to: ? 1 oz (28 g) of meat, chicken, or fish. ? 1 egg. ?  cup (62 g) of tofu.  Eat some foods each day that contain healthy fats, such as avocado, nuts, seeds, and fish.   What foods should I eat? Fruits Berries. Apples. Oranges. Peaches. Apricots. Plums. Grapes. Mango. Papaya. Pomegranate. Kiwi. Cherries. Vegetables Lettuce. Spinach. Leafy greens, including kale, chard, collard greens, and mustard greens. Beets. Cauliflower. Cabbage. Broccoli. Carrots. Green beans. Tomatoes. Peppers. Onions. Cucumbers. Brussels sprouts. Grains Whole grains, such as whole-wheat or whole-grain bread, crackers, tortillas, cereal, and pasta. Unsweetened oatmeal. Quinoa. Brown or wild rice. Meats and other proteins Seafood. Poultry without skin. Lean cuts of poultry and beef. Tofu. Nuts. Seeds. Dairy Low-fat or fat-free dairy products such as milk, yogurt, and cheese. The items listed above may not be a complete list of foods and beverages you can eat. Contact a dietitian for more information. What foods should I avoid? Fruits Fruits canned with  syrup. Vegetables Canned vegetables. Frozen vegetables with butter or cream sauce. Grains Refined white flour and flour products such as bread, pasta, snack foods, and cereals. Avoid all processed foods. Meats and other proteins Fatty cuts of meat. Poultry with skin. Breaded or fried meats. Processed meat. Avoid saturated fats. Dairy Full-fat yogurt, cheese, or milk. Beverages Sweetened drinks, such as soda or iced tea. The items listed above may not be a complete list of foods and beverages you should avoid. Contact a dietitian for more information. Questions to ask a health care provider  Do I need to meet with a diabetes educator?  Do I need to meet with a dietitian?  What number can I call if I have questions?  When are the best times to check my blood glucose? Where to find more information:  American Diabetes Association: diabetes.org  Academy of Nutrition and Dietetics: www.eatright.org  National Institute of Diabetes and Digestive and Kidney Diseases: www.niddk.nih.gov  Association of Diabetes Care and Education Specialists: www.diabeteseducator.org Summary  It is important to have healthy eating   habits because your blood sugar (glucose) levels are greatly affected by what you eat and drink.  A healthy meal plan will help you control your blood glucose and maintain a healthy lifestyle.  Your health care provider may recommend that you work with a dietitian to make a meal plan that is best for you.  Keep in mind that carbohydrates (carbs) and alcohol have immediate effects on your blood glucose levels. It is important to count carbs and to use alcohol carefully. This information is not intended to replace advice given to you by your health care provider. Make sure you discuss any questions you have with your health care provider. Document Revised: 12/19/2018 Document Reviewed: 12/19/2018 [employer] Patient Education  2021 [employer]  

## 2020-05-01 NOTE — Discharge Summary (Signed)
Physician Discharge Summary  Ovetta Bazzano UTM:[id number] DOB: [birth year] DOA: 04/30/2020  PCP: [name], NP  Admit date: 04/30/2020 Discharge date: 05/01/2020  Admitted From: Home Disposition: Home  Recommendations for Outpatient Follow-up:  1. Follow up with PCP in 1-2 weeks 2.   Home Health: No Equipment/Devices: None Discharge Condition: Stable CODE STATUS: Full Diet recommendation: Carb modified Brief/Interim Summary: 65 y.o. female with medical history significant for insulin-dependent diabetes mellitus type 1, hypertension, hyperlipidemia, history of polysubstance abuse, tobacco abuse, presents to emergency department for chief concerns of nausea and vomiting and weakness.  She also endorses fatigue.  She states that this has been going on for about 4 days.  She reports she has been having worsening nausea and vomiting.  She denies sick contacts.  She denies fever, shortness of breath, chest pain, dysuria, hematuria, syncope.  She reports that she does not have difficulty acquiring insulin.  She states that she thinks she has been compliant with her insulin.  She endorses that in the last week she has been having polyuria and polydipsia.  However she states that she has been nauseous and vomiting.  DKA resolved within for several hours of insulin gtt..  Transition to subcutaneous insulin regimen without issue.  Is stable on the subcu regimen at the time of discharge.  Had a lengthy discussion with the patient who is unclear exactly how much insulin she took or why she went into DKA.  My conversation with the daughter via phone she indicates that the patient is nonadherent to diabetic diet restrictions and self dosing of insulin is in question.  As such I recommend a 1 week follow-up with her primary care physician discussed the importance of insulin dosing and medication adherence in addition to dangers of dietary indiscretion.  Patient's daughter also indicated they were  looking for an assisted living facility to have close monitoring of the patient's medications and dosing.  Discharge Diagnoses:  Principal Problem:   DKA (diabetic ketoacidosis) ([facility]) Active Problems:   Malnutrition ([facility])   Essential hypertension   HLD (hyperlipidemia)   Soft tissue mass   COPD (chronic obstructive pulmonary disease) ([facility])   Bipolar 1 disorder ([facility])   H/O cocaine abuse ([facility])   AKI (acute kidney injury) ([place])   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[place])   Severe sepsis with acute organ dysfunction ([facility])  DKA etiology work-up in progress I suspect this is secondary to noncompliance as patient says she thinks that she takes her insulin.  Strongly suspect medication and dietary indiscretion as driver for DKA.   DKA resolved with Endo tool protocol.  Patient tolerating p.o. diet at time of discharge.  I had a lengthy discussion with the patient as well as her daughter over the phone.  Again I suspect medication nonadherence and dietary indiscretion given recent markedly elevated hemoglobin A1c.  Strongly recommend the patient that she follow diabetic dietary restrictions and take her insulin as directed. Patient will follow up with PCP within 1 week of discharge.  No changes made at home medication regimen at this time.  Hypertension resumed home antihypertensive -Resumed amlodipine 10 mg daily -Losartan resumed for 05/01/2020  Acute kidney injury suspect prerenal secondary to poor p.o. intake and noncompliance with insulin management -No baseline CKD -Improving at time of discharge --Can resume ACE inhibitor at time of discharge  # Hyperlipidemia-resumed atorvastatin 40 mg nightly  # Peripheral neuropathy-resumed pregabalin 50 mg twice daily  # GERD-PPI  # Nausea/vomiting-status post 1 dose Haldol per ED  provider with good results of nausea and vomiting symptoms -Tolerating p.o. without nausea or vomiting at time of discharge  # Right axillary soft  tissue mass-patient is aware and states that the size has been decreasing over the years  # Tobacco use - Patient endorses readiness to stop tobacco use.  Discharge Instructions  Discharge Instructions    Diet - low sodium heart healthy   Complete by: As directed    Increase activity slowly   Complete by: As directed      Allergies as of 05/01/2020   No Known Allergies     Medication List    TAKE these medications   albuterol 108 (90 Base) MCG/ACT inhaler Commonly known as: VENTOLIN HFA Inhale 2 puffs into the lungs every 6 (six) hours as needed for wheezing.   amLODipine 10 MG tablet Commonly known as: NORVASC Take 10 mg by mouth daily.   atorvastatin 40 MG tablet Commonly known as: LIPITOR Take 40 mg by mouth at bedtime.   docusate sodium 100 MG capsule Commonly known as: Colace Take 1 capsule (100 mg total) by mouth 2 (two) times daily.   insulin lispro 100 UNIT/ML KwikPen Commonly known as: HUMALOG Inject 6 Units into the skin 3 (three) times daily with meals.   Lantus SoloStar 100 UNIT/ML Solostar Pen Generic drug: insulin glargine Inject 25 Units into the skin daily. What changed: how much to take   losartan 50 MG tablet Commonly known as: COZAAR Take 50 mg by mouth daily.   omeprazole 40 MG capsule Commonly known as: PRILOSEC Take 40 mg by mouth daily.   ondansetron 4 MG disintegrating tablet Commonly known as: Zofran ODT Take 1 tablet (4 mg total) by mouth every 6 (six) hours as needed for nausea or vomiting.   pregabalin 50 MG capsule Commonly known as: LYRICA Take 50 mg by mouth 2 (two) times daily.   Rexulti 1 MG Tabs tablet Generic drug: brexpiprazole Take 1 mg by mouth every morning.   traMADol 50 MG tablet Commonly known as: Ultram Take 1 tablet (50 mg total) by mouth every 6 (six) hours as needed.       Follow-up Information    [name], NP. Schedule an appointment as soon as possible for a visit in 1 week.   Specialty:  Family Medicine Contact information: [place] [address] [phone]              No Known Allergies  Consultations:  None   Procedures/Studies: CT ABDOMEN PELVIS WO CONTRAST  Result Date: 04/25/2020 CLINICAL DATA:  65 year old with nausea and vomiting. EXAM: CT ABDOMEN AND PELVIS WITHOUT CONTRAST TECHNIQUE: Multidetector CT imaging of the abdomen and pelvis was performed following the standard protocol without IV contrast. COMPARISON:  03/17/2020 FINDINGS: Lower chest: Lung bases are clear. Hepatobiliary: Normal appearance of the liver and gallbladder. Pancreas: Unremarkable. No pancreatic ductal dilatation or surrounding inflammatory changes. Spleen: Normal in size without focal abnormality. Adrenals/Urinary Tract: Normal appearance of the adrenal glands. Two calcifications in the right kidney, largest measures 4 mm. These could represent small kidney stones versus vascular calcifications. Negative for hydronephrosis. Fluid in the urinary bladder. Normal appearance of left kidney without hydronephrosis or stones. Small calcification in the left renal sinus is suggestive for a vascular calcification. Stomach/Bowel: Stomach is within normal limits. Appendix is not confidently identified. No evidence of bowel wall thickening, distention, or inflammatory changes. Vascular/Lymphatic: Aorta and iliac arteries are heavily calcified. Negative for an  aortic aneurysm. No significant lymph node enlargement in the abdomen or pelvis. Reproductive: Again noted is a retroverted uterus along the right side of the pelvis. Heterogeneity involving the uterus compatible with fibroid disease. No evidence for an adnexal lesion but limited evaluation. Other: Negative for ascites. Negative for free air. Again noted is a small umbilical hernia containing fat. Musculoskeletal: Again noted is a small focus of sclerosis along the superior right femoral head compatible  with a small area of AVN. No evidence for femoral head collapse. Degenerative facet disease in the lumbar spine. IMPRESSION: 1. No acute abnormality in the abdomen or pelvis. 2. Two small calcifications in the right kidney. These could represent nonobstructive stones. 3. Retroverted uterus with evidence of fibroid disease. 4. 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 5.  Aortic Atherosclerosis (ICD10-I70.0). Electronically Signed   By: Markus Daft M.D.   On: 04/25/2020 17:05   DG Chest Portable 1 View  Result Date: 04/30/2020 CLINICAL DATA:  Chest pain Nausea Vomiting EXAM: PORTABLE CHEST 1 VIEW COMPARISON:  None. FINDINGS: Unchanged mild cardiomegaly. No significant pulmonary vascular congestion. Lungs are clear. Redemonstration of rounded calcification in the right axilla. IMPRESSION: 1. Unchanged cardiomegaly. 2. Redemonstration of rounded calcification in the right axilla. Correlation with physical examination again recommended as this could represent a breast or other soft tissue tumor. Electronically Signed   By: Miachel Roux M.D.   On: 04/30/2020 09:55    (Echo, Carotid, EGD, Colonoscopy, ERCP)    Subjective: Patient seen and examined on the day of discharge.  Stable no distress, tolerating p.o. without issue.  Stable for discharge home  Discharge Exam: Vitals:   05/01/20 0820 05/01/20 1147  BP: (!) 149/96 138/90  Pulse: 80 86  Resp: 17 17  Temp: 98.7 F (37.1 C) 98.6 F (37 C)  SpO2: 97% 100%   Vitals:   05/01/20 0300 05/01/20 0418 05/01/20 0820 05/01/20 1147  BP: (!) 164/91 (!) 163/93 (!) 149/96 138/90  Pulse: 73 81 80 86  Resp: 17 20 17 17   Temp:  98.4 F (36.9 C) 98.7 F (37.1 C) 98.6 F (37 C)  TempSrc:   Oral Oral  SpO2: 96% 100% 97% 100%  Weight:      Height:        General: Pt is alert, awake, not in acute distress Cardiovascular: RRR, S1/S2 +, no rubs, no gallops Respiratory: CTA bilaterally, no wheezing, no rhonchi Abdominal:  Soft, NT, ND, bowel sounds + Extremities: no edema, no cyanosis    The results of significant diagnostics from this hospitalization (including imaging, microbiology, ancillary and laboratory) are listed below for reference.     Microbiology: Recent Results (from the past 240 hour(s))  Resp Panel by RT-PCR (Flu A&B, Covid) Nasopharyngeal Swab     Status: None   Collection Time: 04/30/20 12:54 PM   Specimen: Nasopharyngeal Swab; Nasopharyngeal(NP) swabs in vial transport medium  Result Value Ref Range Status   SARS Coronavirus 2 by RT PCR NEGATIVE NEGATIVE Final    Comment: (NOTE) SARS-CoV-2 target nucleic acids are NOT DETECTED.  The SARS-CoV-2 RNA is generally detectable in upper respiratory specimens during the acute phase of infection. The lowest concentration of SARS-CoV-2 viral copies this assay can detect is 138 copies/mL. A negative result does not preclude SARS-Cov-2 infection and should not be used as the sole basis for treatment or other patient management decisions. A negative result may occur with  improper specimen collection/handling, submission of specimen other than  nasopharyngeal swab, presence of viral mutation(s) within the areas targeted by this assay, and inadequate number of viral copies(<138 copies/mL). A negative result must be combined with clinical observations, patient history, and epidemiological information. The expected result is Negative.  Fact Sheet for Patients:  EntrepreneurPulse.com.au  Fact Sheet for Healthcare Providers:  IncredibleEmployment.be  This test is no t yet approved or cleared by the [place] FDA and  has been authorized for detection and/or diagnosis of SARS-CoV-2 by FDA under an Emergency Use Authorization (EUA). This EUA will remain  in effect (meaning this test can be used) for the duration of the COVID-19 declaration under Section 564(b)(1) of the Act, 21 U.S.C.section 360bbb-3(b)(1),  unless the authorization is terminated  or revoked sooner.       Influenza A by PCR NEGATIVE NEGATIVE Final   Influenza B by PCR NEGATIVE NEGATIVE Final    Comment: (NOTE) The Xpert Xpress SARS-CoV-2/FLU/RSV plus assay is intended as an aid in the diagnosis of influenza from Nasopharyngeal swab specimens and should not be used as a sole basis for treatment. Nasal washings and aspirates are unacceptable for Xpert Xpress SARS-CoV-2/FLU/RSV testing.  Fact Sheet for Patients: EntrepreneurPulse.com.au  Fact Sheet for Healthcare Providers: IncredibleEmployment.be  This test is not yet approved or cleared by the [place] FDA and has been authorized for detection and/or diagnosis of SARS-CoV-2 by FDA under an Emergency Use Authorization (EUA). This EUA will remain in effect (meaning this test can be used) for the duration of the COVID-19 declaration under Section 564(b)(1) of the Act, 21 U.S.C. section 360bbb-3(b)(1), unless the authorization is terminated or revoked.  Performed at [facility], [address] [place], [place] [address]   MRSA PCR Screening     Status: None   Collection Time: 04/30/20  3:31 PM   Specimen: Nasopharyngeal  Result Value Ref Range Status   MRSA by PCR NEGATIVE NEGATIVE Final    Comment:        The GeneXpert MRSA Assay (FDA approved for NASAL specimens only), is one component of a comprehensive MRSA colonization surveillance program. It is not intended to diagnose MRSA infection nor to guide or monitor treatment for MRSA infections. Performed at [facility], [address] [place], [place] [address]      Labs: BNP (last 3 results) No results for input(s): BNP in the last 8760 hours. Basic Metabolic Panel: Recent Labs  Lab 04/30/20 0830 04/30/20 1243 04/30/20 1637 04/30/20 2019 05/01/20 0158  NA 132* 135 138 139 137  K 4.9 4.6 4.6 4.3 4.6  CL 91* 99 101 101 102  CO2 19*  21* 28 29 29   GLUCOSE 578* 381* 185* 116* 96  BUN 46* 44* 37* 35* 37*  CREATININE 1.64* 1.49* 1.11* 1.18* 1.26*  CALCIUM 10.3 9.8 9.8 9.8 9.2  MG  --   --   --  1.9  --   PHOS  --   --   --  2.6  --    Liver Function Tests: Recent Labs  Lab 04/25/20 1508 04/30/20 0830  AST 21 26  ALT 21 21  ALKPHOS 138* 159*  BILITOT 0.7 1.7*  PROT 6.3* 7.9  ALBUMIN 3.2* 3.9   Recent Labs  Lab 04/30/20 0830  LIPASE 25   No results for input(s): AMMONIA in the last 168 hours. CBC: Recent Labs  Lab 04/25/20 1508 04/30/20 0830 05/01/20 0158  WBC 4.9 5.8 7.8  NEUTROABS 2.3 3.7  --   HGB 12.7 14.7 11.2*  HCT 37.0 42.8 32.2*  MCV 95.1 95.1 94.2  PLT 282 319 258   Cardiac Enzymes: No results for input(s): CKTOTAL, CKMB, CKMBINDEX, TROPONINI in the last 168 hours. BNP: Invalid input(s): POCBNP CBG: Recent Labs  Lab 04/30/20 2210 04/30/20 2307 05/01/20 0210 05/01/20 0822 05/01/20 1148  GLUCAP 167* 133* 83 315* 77   D-Dimer No results for input(s): DDIMER in the last 72 hours. Hgb A1c No results for input(s): HGBA1C in the last 72 hours. Lipid Profile No results for input(s): CHOL, HDL, LDLCALC, TRIG, CHOLHDL, LDLDIRECT in the last 72 hours. Thyroid function studies No results for input(s): TSH, T4TOTAL, T3FREE, THYROIDAB in the last 72 hours.  Invalid input(s): FREET3 Anemia work up No results for input(s): VITAMINB12, FOLATE, FERRITIN, TIBC, IRON, RETICCTPCT in the last 72 hours. Urinalysis    Component Value Date/Time   COLORURINE YELLOW (A) 04/25/2020 1348   APPEARANCEUR HAZY (A) 04/25/2020 1348   LABSPEC 1.023 04/25/2020 1348   PHURINE 5.0 04/25/2020 1348   GLUCOSEU >=500 (A) 04/25/2020 1348   HGBUR NEGATIVE 04/25/2020 1348   BILIRUBINUR NEGATIVE 04/25/2020 1348   KETONESUR 20 (A) 04/25/2020 1348   PROTEINUR >=300 (A) 04/25/2020 1348   NITRITE NEGATIVE 04/25/2020 1348   LEUKOCYTESUR NEGATIVE 04/25/2020 1348   Sepsis Labs Invalid input(s): PROCALCITONIN,   WBC,  LACTICIDVEN Microbiology Recent Results (from the past 240 hour(s))  Resp Panel by RT-PCR (Flu A&B, Covid) Nasopharyngeal Swab     Status: None   Collection Time: 04/30/20 12:54 PM   Specimen: Nasopharyngeal Swab; Nasopharyngeal(NP) swabs in vial transport medium  Result Value Ref Range Status   SARS Coronavirus 2 by RT PCR NEGATIVE NEGATIVE Final    Comment: (NOTE) SARS-CoV-2 target nucleic acids are NOT DETECTED.  The SARS-CoV-2 RNA is generally detectable in upper respiratory specimens during the acute phase of infection. The lowest concentration of SARS-CoV-2 viral copies this assay can detect is 138 copies/mL. A negative result does not preclude SARS-Cov-2 infection and should not be used as the sole basis for treatment or other patient management decisions. A negative result may occur with  improper specimen collection/handling, submission of specimen other than nasopharyngeal swab, presence of viral mutation(s) within the areas targeted by this assay, and inadequate number of viral copies(<138 copies/mL). A negative result must be combined with clinical observations, patient history, and epidemiological information. The expected result is Negative.  Fact Sheet for Patients:  EntrepreneurPulse.com.au  Fact Sheet for Healthcare Providers:  IncredibleEmployment.be  This test is no t yet approved or cleared by the [place] FDA and  has been authorized for detection and/or diagnosis of SARS-CoV-2 by FDA under an Emergency Use Authorization (EUA). This EUA will remain  in effect (meaning this test can be used) for the duration of the COVID-19 declaration under Section 564(b)(1) of the Act, 21 U.S.C.section 360bbb-3(b)(1), unless the authorization is terminated  or revoked sooner.       Influenza A by PCR NEGATIVE NEGATIVE Final   Influenza B by PCR NEGATIVE NEGATIVE Final    Comment: (NOTE) The Xpert Xpress SARS-CoV-2/FLU/RSV  plus assay is intended as an aid in the diagnosis of influenza from Nasopharyngeal swab specimens and should not be used as a sole basis for treatment. Nasal washings and aspirates are unacceptable for Xpert Xpress SARS-CoV-2/FLU/RSV testing.  Fact Sheet for Patients: EntrepreneurPulse.com.au  Fact Sheet for Healthcare Providers: IncredibleEmployment.be  This test is not yet approved or cleared by the [place] FDA and has been authorized for detection and/or diagnosis of  SARS-CoV-2 by FDA under an Emergency Use Authorization (EUA). This EUA will remain in effect (meaning this test can be used) for the duration of the COVID-19 declaration under Section 564(b)(1) of the Act, 21 U.S.C. section 360bbb-3(b)(1), unless the authorization is terminated or revoked.  Performed at [facility], [address] [place], [place] [address]   MRSA PCR Screening     Status: None   Collection Time: 04/30/20  3:31 PM   Specimen: Nasopharyngeal  Result Value Ref Range Status   MRSA by PCR NEGATIVE NEGATIVE Final    Comment:        The GeneXpert MRSA Assay (FDA approved for NASAL specimens only), is one component of a comprehensive MRSA colonization surveillance program. It is not intended to diagnose MRSA infection nor to guide or monitor treatment for MRSA infections. Performed at [facility], [address], [place], [place] [address]      Time coordinating discharge: Over 30 minutes  SIGNED:   Sidney Ace, MD  [facility] 05/01/2020, 12:39 PM Pager   If 7PM-7AM, please contact night-coverage

## 2020-05-01 NOTE — Care Management Obs Status (Signed)
[place] NOTIFICATION   Patient Details  Name: Kealey Kemmer MRN: [id number] Date of Birth: [birth year]   Medicare Observation Status Notification Given:  Yes    Shelbie Hutching, RN 05/01/2020, 12:48 PM

## 2020-05-02 LAB — GLUCOSE, CAPILLARY: Glucose-Capillary: 165 mg/dL — ABNORMAL HIGH (ref 70–99)

## 2020-05-05 ENCOUNTER — Emergency Department: Payer: 59

## 2020-05-05 ENCOUNTER — Other Ambulatory Visit: Payer: Self-pay

## 2020-05-05 ENCOUNTER — Observation Stay: Payer: 59

## 2020-05-05 ENCOUNTER — Inpatient Hospital Stay
Admission: EM | Admit: 2020-05-05 | Discharge: 2020-05-07 | DRG: 639 | Disposition: A | Discharge status: Home / Self Care | Payer: 59 | Attending: Internal Medicine | Admitting: Internal Medicine

## 2020-05-05 ENCOUNTER — Encounter: Payer: Self-pay | Admitting: Internal Medicine

## 2020-05-05 DIAGNOSIS — K219 Gastro-esophageal reflux disease without esophagitis: Secondary | ICD-10-CM

## 2020-05-05 DIAGNOSIS — R1013 Epigastric pain: Secondary | ICD-10-CM | POA: Diagnosis not present

## 2020-05-05 DIAGNOSIS — E1010 Type 1 diabetes mellitus with ketoacidosis without coma: Principal | ICD-10-CM

## 2020-05-05 DIAGNOSIS — I2510 Atherosclerotic heart disease of native coronary artery without angina pectoris: Secondary | ICD-10-CM | POA: Diagnosis present

## 2020-05-05 DIAGNOSIS — J449 Chronic obstructive pulmonary disease, unspecified: Secondary | ICD-10-CM

## 2020-05-05 DIAGNOSIS — N1831 Chronic kidney disease, stage 3a: Secondary | ICD-10-CM | POA: Diagnosis present

## 2020-05-05 DIAGNOSIS — F17200 Nicotine dependence, unspecified, uncomplicated: Secondary | ICD-10-CM | POA: Diagnosis present

## 2020-05-05 DIAGNOSIS — E1110 Type 2 diabetes mellitus with ketoacidosis without coma: Secondary | ICD-10-CM | POA: Diagnosis not present

## 2020-05-05 DIAGNOSIS — E109 Type 1 diabetes mellitus without complications: Secondary | ICD-10-CM | POA: Diagnosis present

## 2020-05-05 DIAGNOSIS — R200 Anesthesia of skin: Secondary | ICD-10-CM | POA: Diagnosis present

## 2020-05-05 DIAGNOSIS — E10649 Type 1 diabetes mellitus with hypoglycemia without coma: Secondary | ICD-10-CM | POA: Diagnosis not present

## 2020-05-05 DIAGNOSIS — F32A Depression, unspecified: Secondary | ICD-10-CM | POA: Diagnosis present

## 2020-05-05 DIAGNOSIS — R109 Unspecified abdominal pain: Secondary | ICD-10-CM | POA: Diagnosis present

## 2020-05-05 DIAGNOSIS — R739 Hyperglycemia, unspecified: Secondary | ICD-10-CM

## 2020-05-05 DIAGNOSIS — I252 Old myocardial infarction: Secondary | ICD-10-CM

## 2020-05-05 DIAGNOSIS — Z9111 Patient's noncompliance with dietary regimen: Secondary | ICD-10-CM

## 2020-05-05 DIAGNOSIS — Z79899 Other long term (current) drug therapy: Secondary | ICD-10-CM

## 2020-05-05 DIAGNOSIS — Z20822 Contact with and (suspected) exposure to covid-19: Secondary | ICD-10-CM | POA: Diagnosis present

## 2020-05-05 DIAGNOSIS — E1165 Type 2 diabetes mellitus with hyperglycemia: Secondary | ICD-10-CM

## 2020-05-05 DIAGNOSIS — Z7982 Long term (current) use of aspirin: Secondary | ICD-10-CM

## 2020-05-05 DIAGNOSIS — Z8249 Family history of ischemic heart disease and other diseases of the circulatory system: Secondary | ICD-10-CM

## 2020-05-05 DIAGNOSIS — Z794 Long term (current) use of insulin: Secondary | ICD-10-CM

## 2020-05-05 DIAGNOSIS — E1022 Type 1 diabetes mellitus with diabetic chronic kidney disease: Secondary | ICD-10-CM | POA: Diagnosis present

## 2020-05-05 DIAGNOSIS — Z9114 Patient's other noncompliance with medication regimen: Secondary | ICD-10-CM

## 2020-05-05 DIAGNOSIS — Z720 Tobacco use: Secondary | ICD-10-CM | POA: Diagnosis present

## 2020-05-05 DIAGNOSIS — E785 Hyperlipidemia, unspecified: Secondary | ICD-10-CM | POA: Diagnosis present

## 2020-05-05 DIAGNOSIS — F319 Bipolar disorder, unspecified: Secondary | ICD-10-CM | POA: Diagnosis present

## 2020-05-05 DIAGNOSIS — E1100 Type 2 diabetes mellitus with hyperosmolarity without nonketotic hyperglycemic-hyperosmolar coma (NKHHC): Secondary | ICD-10-CM

## 2020-05-05 DIAGNOSIS — I10 Essential (primary) hypertension: Secondary | ICD-10-CM | POA: Diagnosis present

## 2020-05-05 DIAGNOSIS — K5900 Constipation, unspecified: Secondary | ICD-10-CM | POA: Diagnosis present

## 2020-05-05 DIAGNOSIS — I129 Hypertensive chronic kidney disease with stage 1 through stage 4 chronic kidney disease, or unspecified chronic kidney disease: Secondary | ICD-10-CM | POA: Diagnosis present

## 2020-05-05 HISTORY — DX: Atherosclerotic heart disease of native coronary artery without angina pectoris: I25.10

## 2020-05-05 HISTORY — DX: Essential (primary) hypertension: I10

## 2020-05-05 HISTORY — DX: Bipolar disorder, unspecified: F31.9

## 2020-05-05 HISTORY — DX: Hyperlipidemia, unspecified: E78.5

## 2020-05-05 LAB — URINE DRUG SCREEN, QUALITATIVE (ARMC ONLY)
Amphetamines, Ur Screen: NOT DETECTED
Barbiturates, Ur Screen: NOT DETECTED
Benzodiazepine, Ur Scrn: NOT DETECTED
Cannabinoid 50 Ng, Ur ~~LOC~~: POSITIVE — AB
Cocaine Metabolite,Ur ~~LOC~~: NOT DETECTED
MDMA (Ecstasy)Ur Screen: NOT DETECTED
Methadone Scn, Ur: NOT DETECTED
Opiate, Ur Screen: NOT DETECTED
Phencyclidine (PCP) Ur S: NOT DETECTED
Tricyclic, Ur Screen: NOT DETECTED

## 2020-05-05 LAB — COMPREHENSIVE METABOLIC PANEL
ALT: 18 U/L (ref 0–44)
AST: 26 U/L (ref 15–41)
Albumin: 3.4 g/dL — ABNORMAL LOW (ref 3.5–5.0)
Alkaline Phosphatase: 108 U/L (ref 38–126)
Anion gap: 17 — ABNORMAL HIGH (ref 5–15)
BUN: 37 mg/dL — ABNORMAL HIGH (ref 8–23)
CO2: 22 mmol/L (ref 22–32)
Calcium: 8.9 mg/dL (ref 8.9–10.3)
Chloride: 97 mmol/L — ABNORMAL LOW (ref 98–111)
Creatinine, Ser: 1.23 mg/dL — ABNORMAL HIGH (ref 0.44–1.00)
GFR, Estimated: 49 mL/min — ABNORMAL LOW (ref >60)
Glucose, Bld: 653 mg/dL (ref 70–99)
Potassium: 3.9 mmol/L (ref 3.5–5.1)
Sodium: 136 mmol/L (ref 135–145)
Total Bilirubin: 1 mg/dL (ref 0.3–1.2)
Total Protein: 6.5 g/dL (ref 6.5–8.1)

## 2020-05-05 LAB — BASIC METABOLIC PANEL
Anion gap: 10 (ref 5–15)
Anion gap: 7 (ref 5–15)
BUN: 38 mg/dL — ABNORMAL HIGH (ref 8–23)
BUN: 39 mg/dL — ABNORMAL HIGH (ref 8–23)
CO2: 26 mmol/L (ref 22–32)
CO2: 27 mmol/L (ref 22–32)
Calcium: 9.5 mg/dL (ref 8.9–10.3)
Calcium: 9.1 mg/dL (ref 8.9–10.3)
Chloride: 105 mmol/L (ref 98–111)
Chloride: 107 mmol/L (ref 98–111)
Creatinine, Ser: 1.23 mg/dL — ABNORMAL HIGH (ref 0.44–1.00)
Creatinine, Ser: 1.13 mg/dL — ABNORMAL HIGH (ref 0.44–1.00)
GFR, Estimated: 49 mL/min — ABNORMAL LOW (ref >60)
GFR, Estimated: 54 mL/min — ABNORMAL LOW (ref >60)
Glucose, Bld: 294 mg/dL — ABNORMAL HIGH (ref 70–99)
Glucose, Bld: 154 mg/dL — ABNORMAL HIGH (ref 70–99)
Potassium: 3.6 mmol/L (ref 3.5–5.1)
Potassium: 3.8 mmol/L (ref 3.5–5.1)
Sodium: 141 mmol/L (ref 135–145)
Sodium: 141 mmol/L (ref 135–145)

## 2020-05-05 LAB — CBC
HCT: 38.8 % (ref 36.0–46.0)
Hemoglobin: 13.3 g/dL (ref 12.0–15.0)
MCH: 33.1 pg (ref 26.0–34.0)
MCHC: 34.3 g/dL (ref 30.0–36.0)
MCV: 96.5 fL (ref 80.0–100.0)
Platelets: 289 10*3/uL (ref 150–400)
RBC: 4.02 MIL/uL (ref 3.87–5.11)
RDW: 12.9 % (ref 11.5–15.5)
WBC: 6.4 10*3/uL (ref 4.0–10.5)
nRBC: 0 % (ref 0.0–0.2)

## 2020-05-05 LAB — BLOOD GAS, VENOUS
Acid-Base Excess: 2 mmol/L (ref 0.0–2.0)
Bicarbonate: 27.3 mmol/L (ref 20.0–28.0)
O2 Saturation: 77.5 %
Patient temperature: 37
pCO2, Ven: 44 mmHg (ref 44.0–60.0)
pH, Ven: 7.4 (ref 7.250–7.430)
pO2, Ven: 42 mmHg (ref 32.0–45.0)

## 2020-05-05 LAB — URINALYSIS, COMPLETE (UACMP) WITH MICROSCOPIC
Bacteria, UA: NONE SEEN
Bilirubin Urine: NEGATIVE
Glucose, UA: >=500 mg/dL — AB
Ketones, ur: 20 mg/dL — AB
Leukocytes,Ua: NEGATIVE
Nitrite: NEGATIVE
Protein, ur: >=300 mg/dL — AB
Specific Gravity, Urine: 1.025 (ref 1.005–1.030)
pH: 5 (ref 5.0–8.0)

## 2020-05-05 LAB — RESP PANEL BY RT-PCR (FLU A&B, COVID) ARPGX2
Influenza A by PCR: NEGATIVE
Influenza B by PCR: NEGATIVE
SARS Coronavirus 2 by RT PCR: NEGATIVE

## 2020-05-05 LAB — GLUCOSE, CAPILLARY
Glucose-Capillary: 133 mg/dL — ABNORMAL HIGH (ref 70–99)
Glucose-Capillary: 140 mg/dL — ABNORMAL HIGH (ref 70–99)
Glucose-Capillary: 149 mg/dL — ABNORMAL HIGH (ref 70–99)
Glucose-Capillary: 191 mg/dL — ABNORMAL HIGH (ref 70–99)
Glucose-Capillary: 20 mg/dL — CL (ref 70–99)
Glucose-Capillary: 241 mg/dL — ABNORMAL HIGH (ref 70–99)

## 2020-05-05 LAB — BETA-HYDROXYBUTYRIC ACID: Beta-Hydroxybutyric Acid: 4.09 mmol/L — ABNORMAL HIGH (ref 0.05–0.27)

## 2020-05-05 LAB — CBG MONITORING, ED
Glucose-Capillary: >600 mg/dL (ref 70–99)
Glucose-Capillary: 494 mg/dL — ABNORMAL HIGH (ref 70–99)
Glucose-Capillary: 516 mg/dL (ref 70–99)
Glucose-Capillary: 434 mg/dL — ABNORMAL HIGH (ref 70–99)

## 2020-05-05 LAB — LIPASE, BLOOD: Lipase: 25 U/L (ref 11–51)

## 2020-05-05 LAB — HEMOGLOBIN A1C
Hgb A1c MFr Bld: 13.8 % — ABNORMAL HIGH (ref 4.8–5.6)
Mean Plasma Glucose: 349.36 mg/dL

## 2020-05-05 LAB — ETHANOL: Alcohol, Ethyl (B): <10 mg/dL (ref <10)

## 2020-05-05 MED ORDER — ASPIRIN EC 81 MG PO TBEC
81.0000 mg | DELAYED_RELEASE_TABLET | Freq: Every day | ORAL | Status: DC
  Administered 2020-05-06 – 2020-05-07 (×2): 81 mg via ORAL
  Filled 2020-05-05 (×2): qty 1

## 2020-05-05 MED ORDER — MORPHINE SULFATE (PF) 2 MG/ML IV SOLN
2.0000 mg | INTRAVENOUS | Status: DC | PRN
  Administered 2020-05-05 – 2020-05-06 (×5): 2 mg via INTRAVENOUS
  Filled 2020-05-05 (×5): qty 1

## 2020-05-05 MED ORDER — SENNA 8.6 MG PO TABS: 1.0000 | ORAL_TABLET | Freq: Every day | ORAL | Status: DC | PRN

## 2020-05-05 MED ORDER — BREXPIPRAZOLE 1 MG PO TABS
1.0000 mg | ORAL_TABLET | Freq: Every morning | ORAL | Status: DC
  Administered 2020-05-06 – 2020-05-07 (×2): 1 mg via ORAL
  Filled 2020-05-05 (×2): qty 1

## 2020-05-05 MED ORDER — CHLORHEXIDINE GLUCONATE CLOTH 2 % EX PADS
6.0000 | MEDICATED_PAD | Freq: Every day | CUTANEOUS | Status: DC
  Administered 2020-05-05 – 2020-05-07 (×3): 6 via TOPICAL

## 2020-05-05 MED ORDER — ONDANSETRON HCL 4 MG/2ML IJ SOLN: 4.0000 mg | Freq: Three times a day (TID) | INTRAMUSCULAR | Status: DC | PRN

## 2020-05-05 MED ORDER — DEXTROSE IN LACTATED RINGERS 5 % IV SOLN: INTRAVENOUS | Status: DC

## 2020-05-05 MED ORDER — LACTATED RINGERS IV SOLN: INTRAVENOUS | Status: DC

## 2020-05-05 MED ORDER — MORPHINE SULFATE (PF) 4 MG/ML IV SOLN: 4.0000 mg | INTRAVENOUS | Status: DC | PRN

## 2020-05-05 MED ORDER — ALBUTEROL SULFATE HFA 108 (90 BASE) MCG/ACT IN AERS
2.0000 | INHALATION_SPRAY | Freq: Four times a day (QID) | RESPIRATORY_TRACT | Status: DC | PRN
  Filled 2020-05-05: qty 6.7

## 2020-05-05 MED ORDER — INSULIN REGULAR(HUMAN) IN NACL 100-0.9 UT/100ML-% IV SOLN: INTRAVENOUS | Status: DC

## 2020-05-05 MED ORDER — KETOROLAC TROMETHAMINE 30 MG/ML IJ SOLN
15.0000 mg | Freq: Once | INTRAMUSCULAR | Status: AC
  Administered 2020-05-05: 15 mg via INTRAVENOUS
  Filled 2020-05-05: qty 1

## 2020-05-05 MED ORDER — ACETAMINOPHEN 325 MG PO TABS: 650.0000 mg | ORAL_TABLET | Freq: Four times a day (QID) | ORAL | Status: DC | PRN

## 2020-05-05 MED ORDER — AMLODIPINE BESYLATE 10 MG PO TABS
10.0000 mg | ORAL_TABLET | Freq: Every day | ORAL | Status: DC
  Administered 2020-05-05 – 2020-05-07 (×3): 10 mg via ORAL
  Filled 2020-05-05 (×3): qty 1

## 2020-05-05 MED ORDER — MIRTAZAPINE 15 MG PO TABS
30.0000 mg | ORAL_TABLET | Freq: Every day | ORAL | Status: DC
  Administered 2020-05-05 – 2020-05-06 (×2): 30 mg via ORAL
  Filled 2020-05-05 (×2): qty 2

## 2020-05-05 MED ORDER — ATORVASTATIN CALCIUM 20 MG PO TABS
40.0000 mg | ORAL_TABLET | Freq: Every day | ORAL | Status: DC
  Administered 2020-05-05 – 2020-05-06 (×2): 40 mg via ORAL
  Filled 2020-05-05 (×2): qty 2

## 2020-05-05 MED ORDER — LACTATED RINGERS IV BOLUS
20.0000 mL/kg | Freq: Once | INTRAVENOUS | Status: AC
  Administered 2020-05-05: 970 mL via INTRAVENOUS

## 2020-05-05 MED ORDER — TRAMADOL HCL 50 MG PO TABS
50.0000 mg | ORAL_TABLET | Freq: Four times a day (QID) | ORAL | Status: DC | PRN
  Administered 2020-05-06: 50 mg via ORAL
  Filled 2020-05-05: qty 1

## 2020-05-05 MED ORDER — SODIUM CHLORIDE 0.9 % IV BOLUS
1000.0000 mL | Freq: Once | INTRAVENOUS | Status: AC
  Administered 2020-05-05: 1000 mL via INTRAVENOUS

## 2020-05-05 MED ORDER — PREGABALIN 50 MG PO CAPS
50.0000 mg | ORAL_CAPSULE | Freq: Two times a day (BID) | ORAL | Status: DC
  Administered 2020-05-05 – 2020-05-07 (×4): 50 mg via ORAL
  Filled 2020-05-05: qty 2
  Filled 2020-05-05: qty 1
  Filled 2020-05-05 (×2): qty 2

## 2020-05-05 MED ORDER — DEXTROSE 50 % IV SOLN
0.0000 mL | INTRAVENOUS | Status: DC | PRN
  Administered 2020-05-05: 40 mL via INTRAVENOUS
  Administered 2020-05-06: 25 mL via INTRAVENOUS
  Filled 2020-05-05 (×2): qty 50

## 2020-05-05 MED ORDER — POTASSIUM CHLORIDE 10 MEQ/100ML IV SOLN
10.0000 meq | INTRAVENOUS | Status: AC
  Administered 2020-05-05 (×2): 10 meq via INTRAVENOUS
  Filled 2020-05-05 (×2): qty 100

## 2020-05-05 MED ORDER — DEXTROSE 50 % IV SOLN: 0.0000 mL | INTRAVENOUS | Status: DC | PRN

## 2020-05-05 MED ORDER — LOSARTAN POTASSIUM 50 MG PO TABS
50.0000 mg | ORAL_TABLET | Freq: Every day | ORAL | Status: DC
  Administered 2020-05-05 – 2020-05-07 (×3): 50 mg via ORAL
  Filled 2020-05-05 (×3): qty 1

## 2020-05-05 MED ORDER — INSULIN REGULAR(HUMAN) IN NACL 100-0.9 UT/100ML-% IV SOLN
INTRAVENOUS | Status: DC
Start: 2020-05-05 — End: 2020-05-06
  Administered 2020-05-05: 7 [IU]/h via INTRAVENOUS
  Filled 2020-05-05 (×2): qty 100

## 2020-05-05 MED ORDER — HYDRALAZINE HCL 20 MG/ML IJ SOLN: 5.0000 mg | INTRAMUSCULAR | Status: DC | PRN

## 2020-05-05 MED ORDER — ENOXAPARIN SODIUM 40 MG/0.4ML ~~LOC~~ SOLN
40.0000 mg | SUBCUTANEOUS | Status: DC
  Administered 2020-05-05 – 2020-05-06 (×2): 40 mg via SUBCUTANEOUS
  Filled 2020-05-05 (×2): qty 0.4

## 2020-05-05 MED ORDER — PANTOPRAZOLE SODIUM 40 MG PO TBEC
40.0000 mg | DELAYED_RELEASE_TABLET | Freq: Every day | ORAL | Status: DC
  Administered 2020-05-06 – 2020-05-07 (×2): 40 mg via ORAL
  Filled 2020-05-05 (×2): qty 1

## 2020-05-05 NOTE — H&P (Signed)
History and Physical    Erica Mercer ZJQ:[id number] DOB: Jun 08, 1955 DOA: 05/05/2020  Referring MD/NP/PA:   PCP: [name], NP   Patient coming from:  The patient is coming from home.  At baseline, pt is independent for most of ADL.        Chief Complaint: Abdominal pain, nausea, vomiting, left arm numbness  HPI: [name] is a 65 y.o. female with medical history significant of type 1 diabetes, hypertension, hyperlipidemia, COPD, GERD, depression, CAD, myocardial infarction, cocaine abuse, bipolar, tobacco abuse, DKA, who presents with abdominal pain, nausea, vomiting, left arm numbness.  Patient states that she has been having abdominal pain since yesterday, which is mainly located in the epigastric area, radiating to the right flank area, associated with nausea and nonbilious nonbloody vomiting.  She states that she vomited twice, mostly dry heaves.  No diarrhea. Patient states she is constipated.  She does not have chest pain, cough, shortness breath.  No fever or chills.  No symptoms of UTI.  Patient states that she has intermittent left arm numbness for few days.  Denies weakness in any extremities.  No facial droop or slurred speech. Pt states blood sugar has been reading "high."    ED Course: pt was found to have blood sugar 653, bicarbonate 22, anion gap 17, urinalysis positive for ketone 20, negative for UTI, pending beta hydroxybutyric acid level, negative Covid PCR, pending alcohol level, WBC 6.3, UDS positive for cannabinoid, lipase 25, renal function close to baseline, temperature normal, blood pressure 158/119, heart rate 103, RR 24, oxygen saturation 97% on room air.  ABG with pH 7.40, CO2 44, O2 77.5.  CT head is negative for acute intracranial abnormalities.  Patient is placed on stepdown bed for observation.  Review of Systems:   General: no fevers, chills, no body weight gain, has fatigue HEENT: no blurry vision, hearing changes or sore throat Respiratory:  no dyspnea, coughing, wheezing CV: no chest pain, no palpitations GI: has nausea, vomiting, abdominal pain, no diarrhea, has constipation GU: no dysuria, burning on urination, increased urinary frequency, hematuria  Ext: no leg edema Neuro: has left arm numbness, no vision change or hearing loss Skin: no rash, no skin tear. MSK: No muscle spasm, no deformity, no limitation of range of movement in spin Heme: No easy bruising.  Travel history: No recent long distant travel.  Allergy: No Known Allergies  Past Medical History:  Diagnosis Date  . Bipolar 1 disorder ([place])   . CAD (coronary artery disease)   . Diabetes mellitus without complication ([place])   . HLD (hyperlipidemia)   . HTN (hypertension)     Past Surgical History:  Procedure Laterality Date  . COLONOSCOPY    . ESOPHAGOGASTRODUODENOSCOPY      Social History:  reports that she has been smoking. She has never used smokeless tobacco. She reports previous alcohol use. She reports current drug use. Frequency: 3.00 times per week. Drug: Marijuana.  Family History:  Family History  Problem Relation Age of Onset  . Heart disease Mother   . Prostate cancer Father      Prior to Admission medications   Medication Sig Start Date End Date Taking? Authorizing Provider  albuterol (VENTOLIN HFA) 108 (90 Base) MCG/ACT inhaler Inhale 2 puffs into the lungs every 6 (six) hours as needed for wheezing. 02/21/20 02/15/21  [provider]  amLODipine (NORVASC) 10 MG tablet Take 10 mg by mouth daily. 02/21/20 02/15/21  [provider]  atorvastatin (LIPITOR) 40 MG  tablet Take 40 mg by mouth at bedtime. 02/21/20 02/15/21  [provider]  docusate sodium (COLACE) 100 MG capsule Take 1 capsule (100 mg total) by mouth 2 (two) times daily. 03/17/20 06/15/20  Harvest Dark, MD  insulin glargine (LANTUS SOLOSTAR) 100 UNIT/ML Solostar Pen Inject 25 Units into the skin daily. Patient taking differently: Inject 23 Units into  the skin daily. 02/27/20 02/21/21  Wouk, Ailene Rud, MD  insulin lispro (HUMALOG) 100 UNIT/ML KwikPen Inject 6 Units into the skin 3 (three) times daily with meals. 11/09/19   [provider]  losartan (COZAAR) 50 MG tablet Take 50 mg by mouth daily. 02/21/20 02/15/21  [provider]  omeprazole (PRILOSEC) 40 MG capsule Take 40 mg by mouth daily. 02/21/20 05/21/20  [provider]  ondansetron (ZOFRAN ODT) 4 MG disintegrating tablet Take 1 tablet (4 mg total) by mouth every 6 (six) hours as needed for nausea or vomiting. 04/25/20   Delman Kitten, MD  pregabalin (LYRICA) 50 MG capsule Take 50 mg by mouth 2 (two) times daily. 11/09/19   [provider]  REXULTI 1 MG TABS tablet Take 1 mg by mouth every morning. 03/11/20   [provider]  traMADol (ULTRAM) 50 MG tablet Take 1 tablet (50 mg total) by mouth every 6 (six) hours as needed. 03/17/20   Harvest Dark, MD    Physical Exam: Vitals:   05/05/20 1156 05/05/20 1156 05/05/20 1204 05/05/20 1321  BP:   (!) 158/119 (!) 180/102  Pulse: (!) 102  (!) 103 (!) 103  Resp: (!) 21  (!) 24 (!) 27  Temp:  98.3 F (36.8 C)    TempSrc:  Oral    SpO2: 95%  97% 98%  Weight:      Height:       General: Not in acute distress.  Dry mucous membrane  HEENT:       Eyes: PERRL, EOMI, no scleral icterus.       ENT: No discharge from the ears and nose, no pharynx injection, no tonsillar enlargement.        Neck: No JVD, no bruit, no mass felt. Heme: No neck lymph node enlargement. Cardiac: S1/S2, RRR, No murmurs, No gallops or rubs. Respiratory: No rales, wheezing, rhonchi or rubs. GI: Soft, nondistended, nontender, no rebound pain, no organomegaly, BS present. GU: No hematuria Ext: No pitting leg edema bilaterally. 1+DP/PT pulse bilaterally. Musculoskeletal: No joint deformities, No joint redness or warmth, no limitation of ROM in spin. Skin: No rashes.  Neuro: Alert, oriented X3, cranial nerves II-XII grossly  intact, moves all extremities normally. Muscle strength 5/5 in all extremities. Psych: Patient is not psychotic, no suicidal or hemocidal ideation.  Labs on Admission: I have personally reviewed following labs and imaging studies  CBC: Recent Labs  Lab 04/30/20 0830 05/01/20 0158 05/05/20 1200  WBC 5.8 7.8 6.4  NEUTROABS 3.7  --   --   HGB 14.7 11.2* 13.3  HCT 42.8 32.2* 38.8  MCV 95.1 94.2 96.5  PLT 319 258 242   Basic Metabolic Panel: Recent Labs  Lab 04/30/20 1243 04/30/20 1637 04/30/20 2019 05/01/20 0158 05/05/20 1200  NA 135 138 139 137 136  K 4.6 4.6 4.3 4.6 3.9  CL 99 101 101 102 97*  CO2 21* 28 29 29 22   GLUCOSE 381* 185* 116* 96 653*  BUN 44* 37* 35* 37* 37*  CREATININE 1.49* 1.11* 1.18* 1.26* 1.23*  CALCIUM 9.8 9.8 9.8 9.2 8.9  MG  --   --  1.9  --   --   PHOS  --   --  2.6  --   --    GFR: Estimated Creatinine Clearance: 35.4 mL/min (A) (by C-G formula based on SCr of 1.23 mg/dL (H)). Liver Function Tests: Recent Labs  Lab 04/30/20 0830 05/05/20 1200  AST 26 26  ALT 21 18  ALKPHOS 159* 108  BILITOT 1.7* 1.0  PROT 7.9 6.5  ALBUMIN 3.9 3.4*   Recent Labs  Lab 04/30/20 0830 05/05/20 1200  LIPASE 25 25   No results for input(s): AMMONIA in the last 168 hours. Coagulation Profile: No results for input(s): INR, PROTIME in the last 168 hours. Cardiac Enzymes: No results for input(s): CKTOTAL, CKMB, CKMBINDEX, TROPONINI in the last 168 hours. BNP (last 3 results) No results for input(s): PROBNP in the last 8760 hours. HbA1C: No results for input(s): HGBA1C in the last 72 hours. CBG: Recent Labs  Lab 05/01/20 1148 05/05/20 1157 05/05/20 1513 05/05/20 1518 05/05/20 1619  GLUCAP 77 >600* 516* 494* 434*   Lipid Profile: No results for input(s): CHOL, HDL, LDLCALC, TRIG, CHOLHDL, LDLDIRECT in the last 72 hours. Thyroid Function Tests: No results for input(s): TSH, T4TOTAL, FREET4, T3FREE, THYROIDAB in the last 72 hours. Anemia Panel: No  results for input(s): VITAMINB12, FOLATE, FERRITIN, TIBC, IRON, RETICCTPCT in the last 72 hours. Urine analysis:    Component Value Date/Time   COLORURINE YELLOW (A) 05/05/2020 1321   APPEARANCEUR CLEAR (A) 05/05/2020 1321   LABSPEC 1.025 05/05/2020 1321   PHURINE 5.0 05/05/2020 1321   GLUCOSEU >=500 (A) 05/05/2020 1321   HGBUR SMALL (A) 05/05/2020 1321   BILIRUBINUR NEGATIVE 05/05/2020 1321   KETONESUR 20 (A) 05/05/2020 1321   PROTEINUR >=300 (A) 05/05/2020 1321   NITRITE NEGATIVE 05/05/2020 1321   LEUKOCYTESUR NEGATIVE 05/05/2020 1321   Sepsis Labs: @LABRCNTIP (procalcitonin:4,lacticidven:4) ) Recent Results (from the past 240 hour(s))  Resp Panel by RT-PCR (Flu A&B, Covid) Nasopharyngeal Swab     Status: None   Collection Time: 04/30/20 12:54 PM   Specimen: Nasopharyngeal Swab; Nasopharyngeal(NP) swabs in vial transport medium  Result Value Ref Range Status   SARS Coronavirus 2 by RT PCR NEGATIVE NEGATIVE Final    Comment: (NOTE) SARS-CoV-2 target nucleic acids are NOT DETECTED.  The SARS-CoV-2 RNA is generally detectable in upper respiratory specimens during the acute phase of infection. The lowest concentration of SARS-CoV-2 viral copies this assay can detect is 138 copies/mL. A negative result does not preclude SARS-Cov-2 infection and should not be used as the sole basis for treatment or other patient management decisions. A negative result may occur with  improper specimen collection/handling, submission of specimen other than nasopharyngeal swab, presence of viral mutation(s) within the areas targeted by this assay, and inadequate number of viral copies(<138 copies/mL). A negative result must be combined with clinical observations, patient history, and epidemiological information. The expected result is Negative.  Fact Sheet for Patients:  EntrepreneurPulse.com.au  Fact Sheet for Healthcare Providers:   IncredibleEmployment.be  This test is no t yet approved or cleared by the [place] FDA and  has been authorized for detection and/or diagnosis of SARS-CoV-2 by FDA under an Emergency Use Authorization (EUA). This EUA will remain  in effect (meaning this test can be used) for the duration of the COVID-19 declaration under Section 564(b)(1) of the Act, 21 U.S.C.section 360bbb-3(b)(1), unless the authorization is terminated  or revoked sooner.       Influenza A by PCR NEGATIVE NEGATIVE Final   Influenza  B by PCR NEGATIVE NEGATIVE Final    Comment: (NOTE) The Xpert Xpress SARS-CoV-2/FLU/RSV plus assay is intended as an aid in the diagnosis of influenza from Nasopharyngeal swab specimens and should not be used as a sole basis for treatment. Nasal washings and aspirates are unacceptable for Xpert Xpress SARS-CoV-2/FLU/RSV testing.  Fact Sheet for Patients: EntrepreneurPulse.com.au  Fact Sheet for Healthcare Providers: IncredibleEmployment.be  This test is not yet approved or cleared by the [place] FDA and has been authorized for detection and/or diagnosis of SARS-CoV-2 by FDA under an Emergency Use Authorization (EUA). This EUA will remain in effect (meaning this test can be used) for the duration of the COVID-19 declaration under Section 564(b)(1) of the Act, 21 U.S.C. section 360bbb-3(b)(1), unless the authorization is terminated or revoked.  Performed at [facility], [address] [place], [place] [address]   MRSA PCR Screening     Status: None   Collection Time: 04/30/20  3:31 PM   Specimen: Nasopharyngeal  Result Value Ref Range Status   MRSA by PCR NEGATIVE NEGATIVE Final    Comment:        The GeneXpert MRSA Assay (FDA approved for NASAL specimens only), is one component of a comprehensive MRSA colonization surveillance program. It is not intended to diagnose MRSA infection nor to guide  or monitor treatment for MRSA infections. Performed at [facility], [address] [place], [place] [address]   Resp Panel by RT-PCR (Flu A&B, Covid) Nasopharyngeal Swab     Status: None   Collection Time: 05/05/20  1:21 PM   Specimen: Nasopharyngeal Swab; Nasopharyngeal(NP) swabs in vial transport medium  Result Value Ref Range Status   SARS Coronavirus 2 by RT PCR NEGATIVE NEGATIVE Final    Comment: (NOTE) SARS-CoV-2 target nucleic acids are NOT DETECTED.  The SARS-CoV-2 RNA is generally detectable in upper respiratory specimens during the acute phase of infection. The lowest concentration of SARS-CoV-2 viral copies this assay can detect is 138 copies/mL. A negative result does not preclude SARS-Cov-2 infection and should not be used as the sole basis for treatment or other patient management decisions. A negative result may occur with  improper specimen collection/handling, submission of specimen other than nasopharyngeal swab, presence of viral mutation(s) within the areas targeted by this assay, and inadequate number of viral copies(<138 copies/mL). A negative result must be combined with clinical observations, patient history, and epidemiological information. The expected result is Negative.  Fact Sheet for Patients:  EntrepreneurPulse.com.au  Fact Sheet for Healthcare Providers:  IncredibleEmployment.be  This test is no t yet approved or cleared by the [place] FDA and  has been authorized for detection and/or diagnosis of SARS-CoV-2 by FDA under an Emergency Use Authorization (EUA). This EUA will remain  in effect (meaning this test can be used) for the duration of the COVID-19 declaration under Section 564(b)(1) of the Act, 21 U.S.C.section 360bbb-3(b)(1), unless the authorization is terminated  or revoked sooner.       Influenza A by PCR NEGATIVE NEGATIVE Final   Influenza B by PCR NEGATIVE NEGATIVE Final     Comment: (NOTE) The Xpert Xpress SARS-CoV-2/FLU/RSV plus assay is intended as an aid in the diagnosis of influenza from Nasopharyngeal swab specimens and should not be used as a sole basis for treatment. Nasal washings and aspirates are unacceptable for Xpert Xpress SARS-CoV-2/FLU/RSV testing.  Fact Sheet for Patients: EntrepreneurPulse.com.au  Fact Sheet for Healthcare Providers: IncredibleEmployment.be  This test is not yet approved or cleared by the  [place] Architectural technologist and has been authorized for detection and/or diagnosis of SARS-CoV-2 by FDA under an Print production planner (EUA). This EUA will remain in effect (meaning this test can be used) for the duration of the COVID-19 declaration under Section 564(b)(1) of the Act, 21 U.S.C. section 360bbb-3(b)(1), unless the authorization is terminated or revoked.  Performed at [facility], [address] [place], [place] [address]      Radiological Exams on Admission: CT ABDOMEN PELVIS WO CONTRAST  Result Date: 05/05/2020 CLINICAL DATA:  Left flank pain, hyperglycemia, nausea and vomiting. EXAM: CT ABDOMEN AND PELVIS WITHOUT CONTRAST TECHNIQUE: Multidetector CT imaging of the abdomen and pelvis was performed following the standard protocol without IV contrast. COMPARISON:  04/25/2020. FINDINGS: Lower chest: Lung bases are clear. Heart is at the upper limits of normal in size. Atherosclerotic calcification of the aorta and coronary arteries. No pericardial or pleural effusion. There may be distal esophageal wall thickening which can be seen with gastroesophageal reflux. Hepatobiliary: Liver and gallbladder are unremarkable. No biliary ductal dilatation. Pancreas: Negative. Spleen: Negative. Adrenals/Urinary Tract: Thickening of both adrenal glands, left greater than right. Tiny right renal stones. Kidneys are otherwise unremarkable. Ureters are decompressed. Bladder is grossly unremarkable.  Stomach/Bowel: Stomach and small bowel are unremarkable. Appendix is not readily visualized. Stool is seen in the majority of the colon. Vascular/Lymphatic: Atherosclerotic calcification of the aorta. No pathologically enlarged lymph nodes. Reproductive: Calcified lesions in the uterus are likely fibroids. No adnexal mass. Other: No free fluid.  Mesenteries and peritoneum are unremarkable. Musculoskeletal: Degenerative changes in the spine. 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 No worrisome lytic or sclerotic lesions. IMPRESSION: 1. Right renal stones.  No obstruction. 2. Stool in the majority of the colon is indicative of constipation. 3. 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 4. Aortic atherosclerosis (ICD10-I70.0). Coronary artery calcification. Electronically Signed   By: Lorin Picket M.D.   On: 05/05/2020 13:10   CT Head Wo Contrast  Result Date: 05/05/2020 CLINICAL DATA:  Mental status change EXAM: CT HEAD WITHOUT CONTRAST TECHNIQUE: Contiguous axial images were obtained from the base of the skull through the vertex without intravenous contrast. COMPARISON:  None. FINDINGS: Brain: No acute intracranial hemorrhage. No focal mass lesion. No CT evidence of acute infarction. No midline shift or mass effect. No hydrocephalus. Basilar cisterns are patent. Mild periventricular and subcortical white matter hypodensities. Vascular: No hyperdense vessel or unexpected calcification. Skull: Normal. Negative for fracture or focal lesion. Sinuses/Orbits: Paranasal sinuses and mastoid air cells are clear. Orbits are clear. Other: None. IMPRESSION: 1. No acute intracranial findings. 2. Mild periventricular and subcortical white matter hypodense most consistent small vessel ischemia. Electronically Signed   By: Suzy Bouchard M.D.   On: 05/05/2020 13:05     EKG: I have personally reviewed.  Sinus rhythm, tachycardia, QTC 491, bilateral atrial enlargement, nonspecific  to change  Assessment/Plan Principal Problem:   DKA, type 1 (HCC) Active Problems:   Essential hypertension   HLD (hyperlipidemia)   COPD (chronic obstructive pulmonary disease) (HCC)   Bipolar 1 disorder (HCC)   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HCC)   CAD (coronary artery disease)   Hyperosmolar hyperglycemic state (HHS) (HCC)   Abdominal pain   GERD (gastroesophageal reflux disease)   Tobacco abuse   Left arm numbness   Depression   HHS vs. early stage of DKA: sugar 653, bicarbonate 22, anion gap 17, urinalysis positive for ketone 20.  Mental  status normal  - Place in SDU for obs - 1L of NS and 1L LR  bolus - start DKA protocol with BMP q4h - IVF: LR at 125 cc/h, will switch to D5-LR at 125 cc/h when CBG<250 - replete K as needed - Zofran prn nausea  - NPO  - consult to diabetic educator and case manager  Essential hypertension: -IV hydralazine as needed -Amlodipine, Cozaar,  HLD (hyperlipidemia) -Lipitor  COPD (chronic obstructive pulmonary disease) ([place]): Stable -Bronchodilators  Bipolar 1 disorder (HCC) and depression -Remeron, Rexulti  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[place]): Recent A1c 14.4, poorly controlled.  Patient is taking Humalog and Lantus at home -On DKA protocol  CAD (coronary artery disease) -Lipitor and aspirin  Abdominal pain: Lipase normal.  CT abdomen/pelvis not impressive possibly due to DKA -As needed Zofran and morphine for pain  GERD (gastroesophageal reflux disease) -Protonix  Tobacco abuse -Nicotine patch  Left arm numbness: Etiology is not clear we need to rule out stroke -Follow-up MRI -Patient is on aspirin and Lipitor        DVT ppx: SQ Lovenox Code Status: Full code Family Communication: not done, no family member is at bed side.     Disposition Plan:  Anticipate discharge back to previous environment Consults called:  none Admission status and Level of care: Stepdown:   for obs   Status  is: Observation  The patient remains OBS appropriate and will d/c before 2 midnights.  Dispo: The patient is from: Home              Anticipated d/c is to: Home              Patient currently is not medically stable to d/c.   Difficult to place patient No           Date of Service 05/05/2020    [facility]   If 7PM-7AM, please contact night-coverage [email or web address] 05/05/2020, 5:02 PM

## 2020-05-05 NOTE — ED Triage Notes (Signed)
Pt BIB EMS for epigastric pain and flank pain. Pt states the pain started yesterday. EMS states no tenderness to palpation. Pt states BG has been reading "high." EMS CBG monitor read "high" as well.   Pt has been taking home insulin but not eating.  Pt has hx of CKD.

## 2020-05-05 NOTE — ED Provider Notes (Signed)
[facility] Emergency Department Provider Note    Event Date/Time   First MD Initiated Contact with Patient 05/05/20 1152     (approximate)  I have reviewed the triage vital signs and the nursing notes.   HISTORY  Chief Complaint Abdominal Pain    HPI Erica Mercer is a 65 y.o. female with the below listed past medical history presents to the ER for evaluation of nausea vomiting weakness elevated blood sugars and unable to keep any food or liquid down.  Patient recent admitted for DKA and was placed on insulin infusion.   Denies any dysuria.  No diarrhea   Past Medical History:  Diagnosis Date  . Diabetes mellitus without complication ([place])    No family history on file. No past surgical history on file. Patient Active Problem List   Diagnosis Date Noted  . CAD (coronary artery disease) 05/05/2020  . Hyperosmolar hyperglycemic state (HHS) ([place]) 05/05/2020  . Abdominal pain 05/05/2020  . GERD (gastroesophageal reflux disease) 05/05/2020  . Tobacco abuse 05/05/2020  . Hyperglycemia   . AKI (acute kidney injury) ([place]) 04/30/2020  . 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[place]) 04/30/2020  . Severe sepsis with acute organ dysfunction ([place]) 04/30/2020  . DKA, type 1 ([place]) 02/26/2020  . Acute renal failure ([place])   . DKA (diabetic ketoacidosis) ([place]) 02/25/2020  . Malnutrition ([place]) 02/25/2020  . Essential hypertension 02/25/2020  . HLD (hyperlipidemia) 02/25/2020  . Soft tissue mass 02/25/2020  . H/O cocaine abuse ([place]) 02/17/2018  . Bipolar 1 disorder ([place]) 04/22/2016  . Old myocardial infarction 04/22/2016  . COPD (chronic obstructive pulmonary disease) ([place]) 01/22/2016      Prior to Admission medications   Medication Sig Start Date End Date Taking? Authorizing Provider  albuterol (VENTOLIN HFA) 108 (90 Base) MCG/ACT inhaler Inhale 2 puffs into the lungs every 6 (six) hours as needed for wheezing. 02/21/20 02/15/21  [provider]  amLODipine (NORVASC) 10 MG tablet Take 10 mg by mouth daily. 02/21/20 02/15/21  [provider]  atorvastatin (LIPITOR) 40 MG tablet Take 40 mg by mouth at bedtime. 02/21/20 02/15/21  [provider]  docusate sodium (COLACE) 100 MG capsule Take 1 capsule (100 mg total) by mouth 2 (two) times daily. 03/17/20 06/15/20  Harvest Dark, MD  insulin glargine (LANTUS SOLOSTAR) 100 UNIT/ML Solostar Pen Inject 25 Units into the skin daily. Patient taking differently: Inject 23 Units into the skin daily. 02/27/20 02/21/21  Wouk, Ailene Rud, MD  insulin lispro (HUMALOG) 100 UNIT/ML KwikPen Inject 6 Units into the skin 3 (three) times daily with meals. 11/09/19   [provider]  losartan (COZAAR) 50 MG tablet Take 50 mg by mouth daily. 02/21/20 02/15/21  [provider]  omeprazole (PRILOSEC) 40 MG capsule Take 40 mg by mouth daily. 02/21/20 05/21/20  [provider]  ondansetron (ZOFRAN ODT) 4 MG disintegrating tablet Take 1 tablet (4 mg total) by mouth every 6 (six) hours as needed for nausea or vomiting. 04/25/20   Delman Kitten, MD  pregabalin (LYRICA) 50 MG capsule Take 50 mg by mouth 2 (two) times daily. 11/09/19   [provider]  REXULTI 1 MG TABS tablet Take 1 mg by mouth every morning. 03/11/20   [provider]  traMADol (ULTRAM) 50 MG tablet Take 1 tablet (50 mg total) by mouth every 6 (six) hours as needed. 03/17/20   Harvest Dark, MD    Allergies Patient has no known allergies.    Social History  Social History   Tobacco Use  . Smoking status: Current Every Day Smoker  . Smokeless tobacco: Never Used  Vaping Use  . Vaping Use: Never used  Substance Use Topics  . Alcohol use: Not Currently  . Drug use: Yes    Frequency: 3.0 times per week    Types: Marijuana    Review of Systems Patient denies headaches, rhinorrhea, blurry vision, numbness, shortness of breath, chest pain, edema, cough, abdominal pain, nausea,  vomiting, diarrhea, dysuria, fevers, rashes or hallucinations unless otherwise stated above in HPI. ____________________________________________   PHYSICAL EXAM:  VITAL SIGNS: Vitals:   05/05/20 1156 05/05/20 1204  BP:  (!) 158/119  Pulse:  (!) 103  Resp:  (!) 24  Temp: 98.3 F (36.8 C)   SpO2:  97%    Constitutional: Alert and oriented.  Eyes: Conjunctivae are normal.  Head: Atraumatic. Nose: No congestion/rhinnorhea. Mouth/Throat: Mucous membranes are moist.   Neck: No stridor. Painless ROM.  Cardiovascular: Normal rate, regular rhythm. Grossly normal heart sounds.  Good peripheral circulation. Respiratory: Normal respiratory effort.  No retractions. Lungs CTAB. Gastrointestinal: Soft and nontender. No distention. No abdominal bruits. No CVA tenderness. Genitourinary:  Musculoskeletal: No lower extremity tenderness nor edema.  No joint effusions. Neurologic:  Normal speech and language. No gross focal neurologic deficits are appreciated. No facial droop Skin:  Skin is warm, dry and intact. No rash noted. Psychiatric: Mood and affect are normal. Speech and behavior are normal.  ____________________________________________   LABS (all labs ordered are listed, but only abnormal results are displayed)  Results for orders placed or performed during the hospital encounter of 05/05/20 (from the past 24 hour(s))  CBG monitoring, ED     Status: Abnormal   Collection Time: 05/05/20 11:57 AM  Result Value Ref Range   Glucose-Capillary >600 (HH) 70 - 99 mg/dL  CBC     Status: None   Collection Time: 05/05/20 12:00 PM  Result Value Ref Range   WBC 6.4 4.0 - 10.5 K/uL   RBC 4.02 3.87 - 5.11 MIL/uL   Hemoglobin 13.3 12.0 - 15.0 g/dL   HCT 38.8 36.0 - 46.0 %   MCV 96.5 80.0 - 100.0 fL   MCH 33.1 26.0 - 34.0 pg   MCHC 34.3 30.0 - 36.0 g/dL   RDW 12.9 11.5 - 15.5 %   Platelets 289 150 - 400 K/uL   nRBC 0.0 0.0 - 0.2 %  Comprehensive metabolic panel     Status: Abnormal    Collection Time: 05/05/20 12:00 PM  Result Value Ref Range   Sodium 136 135 - 145 mmol/L   Potassium 3.9 3.5 - 5.1 mmol/L   Chloride 97 (L) 98 - 111 mmol/L   CO2 22 22 - 32 mmol/L   Glucose, Bld 653 (HH) 70 - 99 mg/dL   BUN 37 (H) 8 - 23 mg/dL   Creatinine, Ser 1.23 (H) 0.44 - 1.00 mg/dL   Calcium 8.9 8.9 - 10.3 mg/dL   Total Protein 6.5 6.5 - 8.1 g/dL   Albumin 3.4 (L) 3.5 - 5.0 g/dL   AST 26 15 - 41 U/L   ALT 18 0 - 44 U/L   Alkaline Phosphatase 108 38 - 126 U/L   Total Bilirubin 1.0 0.3 - 1.2 mg/dL   GFR, Estimated 49 (L) >60 mL/min   Anion gap 17 (H) 5 - 15  Lipase, blood     Status: None   Collection Time: 05/05/20 12:00 PM  Result Value Ref Range  Lipase 25 11 - 51 U/L  Blood gas, venous     Status: None   Collection Time: 05/05/20 12:03 PM  Result Value Ref Range   pH, Ven 7.40 7.250 - 7.430   pCO2, Ven 44 44.0 - 60.0 mmHg   pO2, Ven 42.0 32.0 - 45.0 mmHg   Bicarbonate 27.3 20.0 - 28.0 mmol/L   Acid-Base Excess 2.0 0.0 - 2.0 mmol/L   O2 Saturation 77.5 %   Patient temperature 37.0    Collection site VEIN    Sample type VENOUS    ____________________________________________  EKG My review and personal interpretation at Time: 11:52   Indication: n/v  Rate: 105  Rhythm: sinus Axis: normal Other: normal intervals, nos temi ____________________________________________  RADIOLOGY  I personally reviewed all radiographic images ordered to evaluate for the above acute complaints and reviewed radiology reports and findings.  These findings were personally discussed with the patient.  Please see medical record for radiology report.  ____________________________________________   PROCEDURES  Procedure(s) performed:  .Critical Care Performed by: Merlyn Lot, MD Authorized by: Merlyn Lot, MD   Critical care provider statement:    Critical care time (minutes):  15   Critical care time was exclusive of:  Separately billable procedures and treating other  patients   Critical care was necessary to treat or prevent imminent or life-threatening deterioration of the following conditions:  Metabolic crisis   Critical care was time spent personally by me on the following activities:  Development of treatment plan with patient or surrogate, discussions with consultants, evaluation of patient's response to treatment, examination of patient, obtaining history from patient or surrogate, ordering and performing treatments and interventions, ordering and review of laboratory studies, ordering and review of radiographic studies, pulse oximetry, re-evaluation of patient's condition and review of old charts      Critical Care performed: yes ____________________________________________   INITIAL IMPRESSION / ASSESSMENT AND PLAN / ED COURSE  Pertinent labs & imaging results that were available during my care of the patient were reviewed by me and considered in my medical decision making (see chart for details).   DDX: dka, hhns, enteritis, electrolyte abn, dehydration  Erica Mercer is a 65 y.o. who presents to the ED with presentation as described above.  Imaging ordered the above differential.  Does have hyperglycemia with elevated anion gap.  VBG is normal will give IV fluids and place on insulin drip.  Will discuss with hospitalist for admission.     The patient was evaluated in Emergency Department today for the symptoms described in the history of present illness. He/she was evaluated in the context of the global COVID-19 pandemic, which necessitated consideration that the patient might be at risk for infection with the SARS-CoV-2 virus that causes COVID-19. Institutional protocols and algorithms that pertain to the evaluation of patients at risk for COVID-19 are in a state of rapid change based on information released by regulatory bodies including the CDC and federal and state organizations. These policies and algorithms were followed during the  patient's care in the ED.  As part of my medical decision making, I reviewed the following data within the [place] notes reviewed and incorporated, Labs reviewed, notes from prior ED visits and [place] Controlled Substance Database   ____________________________________________   FINAL CLINICAL IMPRESSION(S) / ED DIAGNOSES  Final diagnoses:  Hyperglycemia      NEW MEDICATIONS STARTED DURING THIS VISIT:  New Prescriptions   No medications on file  Note:  This document was prepared using Dragon voice recognition software and may include unintentional dictation errors.    Merlyn Lot, MD 05/05/20 [phone]

## 2020-05-06 DIAGNOSIS — F319 Bipolar disorder, unspecified: Secondary | ICD-10-CM | POA: Diagnosis present

## 2020-05-06 DIAGNOSIS — F17200 Nicotine dependence, unspecified, uncomplicated: Secondary | ICD-10-CM | POA: Diagnosis present

## 2020-05-06 DIAGNOSIS — N1831 Chronic kidney disease, stage 3a: Secondary | ICD-10-CM | POA: Diagnosis present

## 2020-05-06 DIAGNOSIS — Z20822 Contact with and (suspected) exposure to covid-19: Secondary | ICD-10-CM | POA: Diagnosis present

## 2020-05-06 DIAGNOSIS — Z9114 Patient's other noncompliance with medication regimen: Secondary | ICD-10-CM | POA: Diagnosis not present

## 2020-05-06 DIAGNOSIS — I129 Hypertensive chronic kidney disease with stage 1 through stage 4 chronic kidney disease, or unspecified chronic kidney disease: Secondary | ICD-10-CM | POA: Diagnosis present

## 2020-05-06 DIAGNOSIS — E1010 Type 1 diabetes mellitus with ketoacidosis without coma: Secondary | ICD-10-CM | POA: Diagnosis present

## 2020-05-06 DIAGNOSIS — E1022 Type 1 diabetes mellitus with diabetic chronic kidney disease: Secondary | ICD-10-CM | POA: Diagnosis present

## 2020-05-06 DIAGNOSIS — Z7982 Long term (current) use of aspirin: Secondary | ICD-10-CM | POA: Diagnosis not present

## 2020-05-06 DIAGNOSIS — E10649 Type 1 diabetes mellitus with hypoglycemia without coma: Secondary | ICD-10-CM | POA: Diagnosis not present

## 2020-05-06 DIAGNOSIS — E785 Hyperlipidemia, unspecified: Secondary | ICD-10-CM | POA: Diagnosis present

## 2020-05-06 DIAGNOSIS — K5900 Constipation, unspecified: Secondary | ICD-10-CM | POA: Diagnosis present

## 2020-05-06 DIAGNOSIS — Z794 Long term (current) use of insulin: Secondary | ICD-10-CM | POA: Diagnosis not present

## 2020-05-06 DIAGNOSIS — I10 Essential (primary) hypertension: Secondary | ICD-10-CM | POA: Diagnosis not present

## 2020-05-06 DIAGNOSIS — Z9111 Patient's noncompliance with dietary regimen: Secondary | ICD-10-CM | POA: Diagnosis not present

## 2020-05-06 DIAGNOSIS — I2510 Atherosclerotic heart disease of native coronary artery without angina pectoris: Secondary | ICD-10-CM | POA: Diagnosis present

## 2020-05-06 DIAGNOSIS — Z8249 Family history of ischemic heart disease and other diseases of the circulatory system: Secondary | ICD-10-CM | POA: Diagnosis not present

## 2020-05-06 DIAGNOSIS — K219 Gastro-esophageal reflux disease without esophagitis: Secondary | ICD-10-CM | POA: Diagnosis present

## 2020-05-06 DIAGNOSIS — J449 Chronic obstructive pulmonary disease, unspecified: Secondary | ICD-10-CM | POA: Diagnosis present

## 2020-05-06 DIAGNOSIS — Z79899 Other long term (current) drug therapy: Secondary | ICD-10-CM | POA: Diagnosis not present

## 2020-05-06 DIAGNOSIS — I252 Old myocardial infarction: Secondary | ICD-10-CM | POA: Diagnosis not present

## 2020-05-06 DIAGNOSIS — E1110 Type 2 diabetes mellitus with ketoacidosis without coma: Secondary | ICD-10-CM | POA: Diagnosis present

## 2020-05-06 LAB — GLUCOSE, CAPILLARY
Glucose-Capillary: 101 mg/dL — ABNORMAL HIGH (ref 70–99)
Glucose-Capillary: 109 mg/dL — ABNORMAL HIGH (ref 70–99)
Glucose-Capillary: 137 mg/dL — ABNORMAL HIGH (ref 70–99)
Glucose-Capillary: 149 mg/dL — ABNORMAL HIGH (ref 70–99)
Glucose-Capillary: 204 mg/dL — ABNORMAL HIGH (ref 70–99)
Glucose-Capillary: 224 mg/dL — ABNORMAL HIGH (ref 70–99)
Glucose-Capillary: 273 mg/dL — ABNORMAL HIGH (ref 70–99)
Glucose-Capillary: 322 mg/dL — ABNORMAL HIGH (ref 70–99)
Glucose-Capillary: 48 mg/dL — ABNORMAL LOW (ref 70–99)
Glucose-Capillary: 507 mg/dL (ref 70–99)
Glucose-Capillary: 55 mg/dL — ABNORMAL LOW (ref 70–99)
Glucose-Capillary: 79 mg/dL (ref 70–99)
Glucose-Capillary: 95 mg/dL (ref 70–99)

## 2020-05-06 LAB — BASIC METABOLIC PANEL
Anion gap: 6 (ref 5–15)
Anion gap: 7 (ref 5–15)
BUN: 36 mg/dL — ABNORMAL HIGH (ref 8–23)
BUN: 37 mg/dL — ABNORMAL HIGH (ref 8–23)
CO2: 27 mmol/L (ref 22–32)
CO2: 26 mmol/L (ref 22–32)
Calcium: 8.7 mg/dL — ABNORMAL LOW (ref 8.9–10.3)
Calcium: 8.7 mg/dL — ABNORMAL LOW (ref 8.9–10.3)
Chloride: 106 mmol/L (ref 98–111)
Chloride: 104 mmol/L (ref 98–111)
Creatinine, Ser: 1.18 mg/dL — ABNORMAL HIGH (ref 0.44–1.00)
Creatinine, Ser: 1.31 mg/dL — ABNORMAL HIGH (ref 0.44–1.00)
GFR, Estimated: 52 mL/min — ABNORMAL LOW (ref >60)
GFR, Estimated: 45 mL/min — ABNORMAL LOW (ref >60)
Glucose, Bld: 187 mg/dL — ABNORMAL HIGH (ref 70–99)
Glucose, Bld: 330 mg/dL — ABNORMAL HIGH (ref 70–99)
Potassium: 3.5 mmol/L (ref 3.5–5.1)
Potassium: 4 mmol/L (ref 3.5–5.1)
Sodium: 139 mmol/L (ref 135–145)
Sodium: 137 mmol/L (ref 135–145)

## 2020-05-06 LAB — CBC
HCT: 28.5 % — ABNORMAL LOW (ref 36.0–46.0)
Hemoglobin: 9.7 g/dL — ABNORMAL LOW (ref 12.0–15.0)
MCH: 33.1 pg (ref 26.0–34.0)
MCHC: 34 g/dL (ref 30.0–36.0)
MCV: 97.3 fL (ref 80.0–100.0)
Platelets: 214 10*3/uL (ref 150–400)
RBC: 2.93 MIL/uL — ABNORMAL LOW (ref 3.87–5.11)
RDW: 13 % (ref 11.5–15.5)
WBC: 8.8 10*3/uL (ref 4.0–10.5)
nRBC: 0 % (ref 0.0–0.2)

## 2020-05-06 MED ORDER — INSULIN GLARGINE 100 UNIT/ML ~~LOC~~ SOLN
23.0000 [IU] | Freq: Every day | SUBCUTANEOUS | Status: DC
  Filled 2020-05-06: qty 0.23

## 2020-05-06 MED ORDER — INSULIN ASPART 100 UNIT/ML ~~LOC~~ SOLN
0.0000 [IU] | SUBCUTANEOUS | Status: DC
  Administered 2020-05-06: 5 [IU] via SUBCUTANEOUS
  Administered 2020-05-06: 15 [IU] via SUBCUTANEOUS
  Administered 2020-05-06: 8 [IU] via SUBCUTANEOUS
  Administered 2020-05-07 (×2): 3 [IU] via SUBCUTANEOUS
  Filled 2020-05-06 (×5): qty 1

## 2020-05-06 MED ORDER — INSULIN GLARGINE 100 UNIT/ML ~~LOC~~ SOLN
25.0000 [IU] | Freq: Every day | SUBCUTANEOUS | Status: DC
  Administered 2020-05-06 – 2020-05-07 (×2): 25 [IU] via SUBCUTANEOUS
  Filled 2020-05-06 (×3): qty 0.25

## 2020-05-06 NOTE — Progress Notes (Addendum)
Report given to 1A Marcie Bal, RN. Pt aware of transfer, will send belongings with pt. She refused family notification of transfer tonight - pt will call daughter in the AM.

## 2020-05-06 NOTE — Progress Notes (Signed)
PROGRESS NOTE    Erica Mercer  JKK:[id number] DOB: 06-05-1955 DOA: 05/05/2020 PCP: [name], NP   Brief Narrative: 65 y.o. female with medical history significant of type 1 diabetes, hypertension, hyperlipidemia, COPD, GERD, depression, CAD, myocardial infarction, cocaine abuse, bipolar, tobacco abuse, DKA, who presents with abdominal pain, nausea, vomiting, left arm numbness.  Patient states that she has been having abdominal pain since yesterday, which is mainly located in the epigastric area, radiating to the right flank area, associated with nausea and nonbilious nonbloody vomiting.  She states that she vomited twice, mostly dry heaves.  No diarrhea. Patient states she is constipated.  She does not have chest pain, cough, shortness breath.  No fever or chills.  No symptoms of UTI.  Patient states that she has intermittent left arm numbness for few days.  Denies weakness in any extremities.  No facial droop or slurred speech. Pt states blood sugar has been reading "high."   HHS versus early DKA.  Required insulin gtt.  Insulin GTT stopped without administration of Lantus due to hypoglycemic readings.  Now more stable on basal bolus regimen.  This patient is well-known to me from a recent prior admission, was discharged home on 4/7.  Presents again with same complaints.  Etiology of hyperglycemic episodes due to medication nonadherence and suspected dietary indiscretions.  Patient will really benefit from placement and a assisted living facility.   Assessment & Plan:   Principal Problem:   DKA, type 1 ([place]) Active Problems:   Essential hypertension   HLD (hyperlipidemia)   COPD (chronic obstructive pulmonary disease) (HCC)   Bipolar 1 disorder (HCC)   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HCC)   CAD (coronary artery disease)   Hyperosmolar hyperglycemic state (HHS) (HCC)   Abdominal pain   GERD (gastroesophageal reflux disease)   Tobacco abuse   Left arm numbness    Depression  HHS Poorly controlled insulin-dependent diabetes mellitus Patient required stepdown unit insulin gtt. Sugars improved, insulin GTT weaned off Now on basal bolus regimen with improved glycemic control A1c greater than 14 Plan: Transfer to MedSurg Lantus 25 units daily Sliding scale Carb modified diet Diabetes coordinator consult TOC consult If possible explore options regarding assisted living facility placement directly from the hospital  Essential hypertension Continue home amlodipine and losartan Hold IV hydration as patient is tolerating p.o.  HLD (hyperlipidemia) -Lipitor  COPD (chronic obstructive pulmonary disease) ([place]): Stable -Bronchodilators  Bipolar 1 disorder (HCC) and depression -Remeron, Rexulti   CAD (coronary artery disease) -Lipitor and aspirin  Abdominal pain: Lipase normal.  CT abdomen/pelvis not impressive possibly due to DKA -As needed Zofran and morphine for pain  GERD (gastroesophageal reflux disease) -Protonix  Tobacco abuse -Nicotine patch  Left arm numbness: Etiology is not clear we need to rule out stroke -MRI negative -Left arm numbness improved, etiology likely related to hyperglycemia  DVT prophylaxis: Lovenox Code Status: Full Family Communication: None today Disposition Plan: Status is: Observation  The patient will require care spanning > 2 midnights and should be moved to inpatient because: Inpatient level of care appropriate due to severity of illness  Dispo: The patient is from: Home              Anticipated d/c is to: Unclear at this time, home versus ALF              Patient currently is not medically stable to d/c.   Difficult to place patient No   Resolving HHS.  Secondary to  medication and dietary nonadherence.  Patient is not reliable to dose her own medications at home and this would likely result in repeated admissions.  TOC looking for options of assisted living facility.    Level of care:  Stepdown  Consultants:   None   Procedures: None   Antimicrobials:   None   Subjective: Patient seen and examined.  HHS is resolved.  No pain complaints..  Objective: Vitals:   05/06/20 1100 05/06/20 1122 05/06/20 1200 05/06/20 1300  BP: 105/68  (!) 111/99   Pulse: 75 (!) 55 73   Resp: 14 15 13 13   Temp:  98.7 F (37.1 C)    TempSrc:  Oral    SpO2: 99% 100% 100%   Weight:      Height:        Intake/Output Summary (Last 24 hours) at 05/06/2020 1326 Last data filed at 05/06/2020 1123 Gross per 24 hour  Intake 6171.09 ml  Output 750 ml  Net 5421.09 ml   Filed Weights   05/05/20 1154 05/05/20 1710  Weight: 48.5 kg 47.8 kg    Examination:  General exam: Appears calm and comfortable  Respiratory system: Clear to auscultation. Respiratory effort normal. Cardiovascular system: S1 & S2 heard, RRR. No JVD, murmurs, rubs, gallops or clicks. No pedal edema. Gastrointestinal system: Abdomen is nondistended, soft and nontender. No organomegaly or masses felt. Normal bowel sounds heard. Central nervous system: Alert and oriented. No focal neurological deficits. Extremities: Symmetric 5 x 5 power. Skin: No rashes, lesions or ulcers Psychiatry: Judgement and insight appear normal. Mood & affect appropriate.     Data Reviewed: I have personally reviewed following labs and imaging studies  CBC: Recent Labs  Lab 04/30/20 0830 05/01/20 0158 05/05/20 1200 05/06/20 0552  WBC 5.8 7.8 6.4 8.8  NEUTROABS 3.7  --   --   --   HGB 14.7 11.2* 13.3 9.7*  HCT 42.8 32.2* 38.8 28.5*  MCV 95.1 94.2 96.5 97.3  PLT 319 258 289 505   Basic Metabolic Panel: Recent Labs  Lab 04/30/20 2019 05/01/20 0158 05/05/20 1200 05/05/20 1752 05/05/20 2201 05/06/20 0141 05/06/20 0552  NA 139   < > 136 141 141 139 137  K 4.3   < > 3.9 3.6 3.8 3.5 4.0  CL 101   < > 97* 105 107 106 104  CO2 29   < > 22 26 27 27 26   GLUCOSE 116*   < > 653* 294* 154* 187* 330*  BUN 35*   < > 37* 38* 39*  36* 37*  CREATININE 1.18*   < > 1.23* 1.23* 1.13* 1.18* 1.31*  CALCIUM 9.8   < > 8.9 9.5 9.1 8.7* 8.7*  MG 1.9  --   --   --   --   --   --   PHOS 2.6  --   --   --   --   --   --    < > = values in this interval not displayed.   GFR: Estimated Creatinine Clearance: 32.7 mL/min (A) (by C-G formula based on SCr of 1.31 mg/dL (H)). Liver Function Tests: Recent Labs  Lab 04/30/20 0830 05/05/20 1200  AST 26 26  ALT 21 18  ALKPHOS 159* 108  BILITOT 1.7* 1.0  PROT 7.9 6.5  ALBUMIN 3.9 3.4*   Recent Labs  Lab 04/30/20 0830 05/05/20 1200  LIPASE 25 25   No results for input(s): AMMONIA in the last 168 hours. Coagulation Profile: No results  for input(s): INR, PROTIME in the last 168 hours. Cardiac Enzymes: No results for input(s): CKTOTAL, CKMB, CKMBINDEX, TROPONINI in the last 168 hours. BNP (last 3 results) No results for input(s): PROBNP in the last 8760 hours. HbA1C: Recent Labs    05/05/20 1200  HGBA1C 13.8*   CBG: Recent Labs  Lab 05/06/20 0223 05/06/20 0301 05/06/20 0412 05/06/20 0729 05/06/20 1107  GLUCAP 109* 101* 149* 507* 273*   Lipid Profile: No results for input(s): CHOL, HDL, LDLCALC, TRIG, CHOLHDL, LDLDIRECT in the last 72 hours. Thyroid Function Tests: No results for input(s): TSH, T4TOTAL, FREET4, T3FREE, THYROIDAB in the last 72 hours. Anemia Panel: No results for input(s): VITAMINB12, FOLATE, FERRITIN, TIBC, IRON, RETICCTPCT in the last 72 hours. Sepsis Labs: No results for input(s): PROCALCITON, LATICACIDVEN in the last 168 hours.  Recent Results (from the past 240 hour(s))  Resp Panel by RT-PCR (Flu A&B, Covid) Nasopharyngeal Swab     Status: None   Collection Time: 04/30/20 12:54 PM   Specimen: Nasopharyngeal Swab; Nasopharyngeal(NP) swabs in vial transport medium  Result Value Ref Range Status   SARS Coronavirus 2 by RT PCR NEGATIVE NEGATIVE Final    Comment: (NOTE) SARS-CoV-2 target nucleic acids are NOT DETECTED.  The SARS-CoV-2 RNA  is generally detectable in upper respiratory specimens during the acute phase of infection. The lowest concentration of SARS-CoV-2 viral copies this assay can detect is 138 copies/mL. A negative result does not preclude SARS-Cov-2 infection and should not be used as the sole basis for treatment or other patient management decisions. A negative result may occur with  improper specimen collection/handling, submission of specimen other than nasopharyngeal swab, presence of viral mutation(s) within the areas targeted by this assay, and inadequate number of viral copies(<138 copies/mL). A negative result must be combined with clinical observations, patient history, and epidemiological information. The expected result is Negative.  Fact Sheet for Patients:  EntrepreneurPulse.com.au  Fact Sheet for Healthcare Providers:  IncredibleEmployment.be  This test is no t yet approved or cleared by the [place] FDA and  has been authorized for detection and/or diagnosis of SARS-CoV-2 by FDA under an Emergency Use Authorization (EUA). This EUA will remain  in effect (meaning this test can be used) for the duration of the COVID-19 declaration under Section 564(b)(1) of the Act, 21 U.S.C.section 360bbb-3(b)(1), unless the authorization is terminated  or revoked sooner.       Influenza A by PCR NEGATIVE NEGATIVE Final   Influenza B by PCR NEGATIVE NEGATIVE Final    Comment: (NOTE) The Xpert Xpress SARS-CoV-2/FLU/RSV plus assay is intended as an aid in the diagnosis of influenza from Nasopharyngeal swab specimens and should not be used as a sole basis for treatment. Nasal washings and aspirates are unacceptable for Xpert Xpress SARS-CoV-2/FLU/RSV testing.  Fact Sheet for Patients: EntrepreneurPulse.com.au  Fact Sheet for Healthcare Providers: IncredibleEmployment.be  This test is not yet approved or cleared by the  [place] FDA and has been authorized for detection and/or diagnosis of SARS-CoV-2 by FDA under an Emergency Use Authorization (EUA). This EUA will remain in effect (meaning this test can be used) for the duration of the COVID-19 declaration under Section 564(b)(1) of the Act, 21 U.S.C. section 360bbb-3(b)(1), unless the authorization is terminated or revoked.  Performed at [facility], [address], [place], [place] [address]   MRSA PCR Screening     Status: None   Collection Time: 04/30/20  3:31 PM   Specimen: Nasopharyngeal  Result Value Ref Range Status  MRSA by PCR NEGATIVE NEGATIVE Final    Comment:        The GeneXpert MRSA Assay (FDA approved for NASAL specimens only), is one component of a comprehensive MRSA colonization surveillance program. It is not intended to diagnose MRSA infection nor to guide or monitor treatment for MRSA infections. Performed at [facility], [address] [place], [place] [address]   Resp Panel by RT-PCR (Flu A&B, Covid) Nasopharyngeal Swab     Status: None   Collection Time: 05/05/20  1:21 PM   Specimen: Nasopharyngeal Swab; Nasopharyngeal(NP) swabs in vial transport medium  Result Value Ref Range Status   SARS Coronavirus 2 by RT PCR NEGATIVE NEGATIVE Final    Comment: (NOTE) SARS-CoV-2 target nucleic acids are NOT DETECTED.  The SARS-CoV-2 RNA is generally detectable in upper respiratory specimens during the acute phase of infection. The lowest concentration of SARS-CoV-2 viral copies this assay can detect is 138 copies/mL. A negative result does not preclude SARS-Cov-2 infection and should not be used as the sole basis for treatment or other patient management decisions. A negative result may occur with  improper specimen collection/handling, submission of specimen other than nasopharyngeal swab, presence of viral mutation(s) within the areas targeted by this assay, and inadequate number of  viral copies(<138 copies/mL). A negative result must be combined with clinical observations, patient history, and epidemiological information. The expected result is Negative.  Fact Sheet for Patients:  EntrepreneurPulse.com.au  Fact Sheet for Healthcare Providers:  IncredibleEmployment.be  This test is no t yet approved or cleared by the [place] FDA and  has been authorized for detection and/or diagnosis of SARS-CoV-2 by FDA under an Emergency Use Authorization (EUA). This EUA will remain  in effect (meaning this test can be used) for the duration of the COVID-19 declaration under Section 564(b)(1) of the Act, 21 U.S.C.section 360bbb-3(b)(1), unless the authorization is terminated  or revoked sooner.       Influenza A by PCR NEGATIVE NEGATIVE Final   Influenza B by PCR NEGATIVE NEGATIVE Final    Comment: (NOTE) The Xpert Xpress SARS-CoV-2/FLU/RSV plus assay is intended as an aid in the diagnosis of influenza from Nasopharyngeal swab specimens and should not be used as a sole basis for treatment. Nasal washings and aspirates are unacceptable for Xpert Xpress SARS-CoV-2/FLU/RSV testing.  Fact Sheet for Patients: EntrepreneurPulse.com.au  Fact Sheet for Healthcare Providers: IncredibleEmployment.be  This test is not yet approved or cleared by the [place] FDA and has been authorized for detection and/or diagnosis of SARS-CoV-2 by FDA under an Emergency Use Authorization (EUA). This EUA will remain in effect (meaning this test can be used) for the duration of the COVID-19 declaration under Section 564(b)(1) of the Act, 21 U.S.C. section 360bbb-3(b)(1), unless the authorization is terminated or revoked.  Performed at [facility], [address] [place], [place] [address]          Radiology Studies: CT ABDOMEN PELVIS WO CONTRAST  Result Date: 05/05/2020 CLINICAL DATA:  Left  flank pain, hyperglycemia, nausea and vomiting. EXAM: CT ABDOMEN AND PELVIS WITHOUT CONTRAST TECHNIQUE: Multidetector CT imaging of the abdomen and pelvis was performed following the standard protocol without IV contrast. COMPARISON:  04/25/2020. FINDINGS: Lower chest: Lung bases are clear. Heart is at the upper limits of normal in size. Atherosclerotic calcification of the aorta and coronary arteries. No pericardial or pleural effusion. There may be distal esophageal wall thickening which can be seen with gastroesophageal reflux. Hepatobiliary: Liver and gallbladder are unremarkable.  No biliary ductal dilatation. Pancreas: Negative. Spleen: Negative. Adrenals/Urinary Tract: Thickening of both adrenal glands, left greater than right. Tiny right renal stones. Kidneys are otherwise unremarkable. Ureters are decompressed. Bladder is grossly unremarkable. Stomach/Bowel: Stomach and small bowel are unremarkable. Appendix is not readily visualized. Stool is seen in the majority of the colon. Vascular/Lymphatic: Atherosclerotic calcification of the aorta. No pathologically enlarged lymph nodes. Reproductive: Calcified lesions in the uterus are likely fibroids. No adnexal mass. Other: No free fluid.  Mesenteries and peritoneum are unremarkable. Musculoskeletal: Degenerative changes in the spine. 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 No worrisome lytic or sclerotic lesions. IMPRESSION: 1. Right renal stones.  No obstruction. 2. Stool in the majority of the colon is indicative of constipation. 3. 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 4. Aortic atherosclerosis (ICD10-I70.0). Coronary artery calcification. Electronically Signed   By: Lorin Picket M.D.   On: 05/05/2020 13:10   CT Head Wo Contrast  Result Date: 05/05/2020 CLINICAL DATA:  Mental status change EXAM: CT HEAD WITHOUT CONTRAST TECHNIQUE: Contiguous axial images were obtained from the base of the skull through the  vertex without intravenous contrast. COMPARISON:  None. FINDINGS: Brain: No acute intracranial hemorrhage. No focal mass lesion. No CT evidence of acute infarction. No midline shift or mass effect. No hydrocephalus. Basilar cisterns are patent. Mild periventricular and subcortical white matter hypodensities. Vascular: No hyperdense vessel or unexpected calcification. Skull: Normal. Negative for fracture or focal lesion. Sinuses/Orbits: Paranasal sinuses and mastoid air cells are clear. Orbits are clear. Other: None. IMPRESSION: 1. No acute intracranial findings. 2. Mild periventricular and subcortical white matter hypodense most consistent small vessel ischemia. Electronically Signed   By: Suzy Bouchard M.D.   On: 05/05/2020 13:05   MR BRAIN WO CONTRAST  Result Date: 05/05/2020 CLINICAL DATA:  Initial evaluation for neuro deficit, stroke suspected. EXAM: MRI HEAD WITHOUT CONTRAST TECHNIQUE: Multiplanar, multiecho pulse sequences of the brain and surrounding structures were obtained without intravenous contrast. COMPARISON:  Prior head CT from earlier same day. FINDINGS: Brain: Examination degraded by motion. Cerebral volume within normal limits for age. Patchy T2/FLAIR hyperintensity within the periventricular deep white matter both cerebral hemispheres as well as the pons, most consistent with chronic small vessel ischemic disease, mild to moderate in nature. No abnormal foci of restricted diffusion to suggest acute or subacute ischemia. Gray-white matter differentiation maintained. No encephalomalacia to suggest chronic cortical infarction. No evidence for acute or chronic intracranial hemorrhage. No mass lesion, midline shift or mass effect. No hydrocephalus or extra-axial fluid collection. Pituitary gland suprasellar region normal. Midline structures intact. Vascular: Major intracranial vascular flow voids are maintained. Skull and upper cervical spine: Craniocervical junction normal. Bone marrow signal  intensity within normal limits. No scalp soft tissue abnormality. Sinuses/Orbits: Globes and orbital soft tissues within normal limits. Moderate mucosal thickening noted within the right maxillary sinus. Additional mild mucosal thickening noted elsewhere within the paranasal sinuses. No mastoid effusion. Inner ear structures grossly normal. Other: None. IMPRESSION: 1. No acute intracranial abnormality. 2. Mild to moderate chronic microvascular ischemic disease. Electronically Signed   By: Jeannine Boga M.D.   On: 05/05/2020 23:38        Scheduled Meds: . amLODipine  10 mg Oral Daily  . aspirin EC  81 mg Oral Daily  . atorvastatin  40 mg Oral QHS  . brexpiprazole  1 mg Oral q morning  . Chlorhexidine Gluconate Cloth  6 each Topical Daily  . enoxaparin (LOVENOX) injection  40 mg Subcutaneous Q24H  . insulin aspart  0-15 Units Subcutaneous Q4H  . insulin glargine  25 Units Subcutaneous Daily  . losartan  50 mg Oral Daily  . mirtazapine  30 mg Oral QHS  . pantoprazole  40 mg Oral Daily  . pregabalin  50 mg Oral BID   Continuous Infusions: . dextrose 5% lactated ringers Stopped (05/06/20 0639)  . lactated ringers 125 mL/hr at 05/06/20 0640     LOS: 0 days    Time spent: 25 minutes    Sidney Ace, MD [facility] Pager [phone]  If 7PM-7AM, please contact night-coverage 05/06/2020, 1:26 PM

## 2020-05-06 NOTE — Progress Notes (Addendum)
Inpatient Diabetes Program Recommendations  AACE/ADA: New Consensus Statement on Inpatient Glycemic Control (2015)  Target Ranges:  Prepandial:   less than 140 mg/dL      Peak postprandial:   less than 180 mg/dL (1-2 hours)      Critically ill patients:  140 - 180 mg/dL   Results for Erica Mercer, Erica Mercer (MRN [id number]) as of 05/06/2020 06:58  Ref. Range 05/05/2020 11:57 05/05/2020 15:13 05/05/2020 15:18 05/05/2020 16:19 05/05/2020 18:15 05/05/2020 19:22 05/05/2020 20:39 05/05/2020 21:42 05/05/2020 23:15 05/05/2020 23:17 05/05/2020 23:38 05/06/2020 00:07 05/06/2020 00:40 05/06/2020 01:18 05/06/2020 01:34 05/06/2020 02:23 05/06/2020 03:01 05/06/2020 04:12  Glucose-Capillary Latest Ref Range: 70 - 99 mg/dL >600 (HH) 516 (HH)  IV Insulin Drip Started at 2:05pm 494 (H) 434 (H) 241 (H) 191 (H) 133 (H) 149 (H) 23 (LL) 20 (LL) 140 (H) 137 (H) 79 48 (L) 204 (H) 109 (H)  IV Insulin Drip Stopped 101 (H) 149 (H)   Results for Erica Mercer, Erica Mercer (MRN [id number]) as of 05/06/2020 11:42  Ref. Range 05/06/2020 07:29 05/06/2020 11:07  Glucose-Capillary Latest Ref Range: 70 - 99 mg/dL 507 (HH)  15 units NOVOLOG  25 units LANTUS 273 (H)  8 units NOVOLOG    Results for Erica Mercer, [name] as of 05/06/2020 06:58  Ref. Range 02/25/2020 12:45 05/05/2020 12:00  Hemoglobin A1C Latest Ref Range: 4.8 - 5.6 % 14.4 (H) 13.8 (H)  (349 mg/dl)   To ED with Abd Pain/ N&V/ Hyperglycemia (here quite a bit--5th ED visit/ 2 admits since Jan 2022)   History: Type 1 Diabetes  Home DM Meds: Lantus 23 units Daily          Humalog 6 units TID   Current Orders: Lantus 23 units Daily      Novolog Moderate Correction Scale/ SSI (0-15 units) Q4 hours  PCP: Burna Forts, NP with [facility] seen 04/17/2020    A1c was 14.4% (Jan 2022)--Was counseled by Diabetes RN at that admit   Note that IV Insulin Drip stopped around 2am today and NO Basal insulin given prior to stopping the  Drip  Lantus to be given this AM    Addendum 10:30am--Met w/ pt at bedside.  Well known to our diabetes team due to frequent ED visits--admits.  Pt admitted to me that she sometimes forgets to take insulin and sometimes just doesn't take the insulin b/c she doesn't want to.  I reviewed proper insulin technique with pt with the insulin pen and pt told me "I know how to use it, I just need to do it".  Wants to move to an ALF type setting and get out of her daughter's home.  States she needs help remembering to take her meds and needs someone to encourage her to take her meds.  Mentioned a senior home in [place], however, I am unsure of the location or the name of the senior home.  Not sure if pt would qualify for ALF?  Has insulin at home.  Told me she only has 1 pen each of Lantus and Humalog--encouraged pt to call her PCP office to get refills--Sees [place] and is current with that office.  I strongly encouraged pt to improve her glucose control and to be consistent with her meds. Reminded pt about the stress of hyperglycemia on the body and how having better controlled CBGs will make her feel better overall.  I have placed TOC consult to see what we can do for  pt to help her better manage her health at home and to see if she would qualify for ALF or [facility] services.    --Will follow patient during hospitalization--  Wyn Quaker RN, MSN, CDE Diabetes Coordinator Inpatient Glycemic Control Team Team Pager: [phone] (8a-5p)

## 2020-05-06 NOTE — Progress Notes (Signed)
Insulin drip stopped at 2315 last night due to BG - 23. Unable to resume drip thereafter due to rapidly fluctuating BGs requiring prn D50 interventions x2. Q30 to Q1 monitoring done initially post insulin drip discontinuation, then weaned to q4 BG monitoring after 4AM. At this time, sliding scale coverage and carb modified diet has also been started. No basal insulin coverage at this time per NP orders. Latus dose ordered to start later this morning. Last BG at 0400  = 149.

## 2020-05-07 DIAGNOSIS — Z720 Tobacco use: Secondary | ICD-10-CM

## 2020-05-07 DIAGNOSIS — E785 Hyperlipidemia, unspecified: Secondary | ICD-10-CM

## 2020-05-07 DIAGNOSIS — F319 Bipolar disorder, unspecified: Secondary | ICD-10-CM

## 2020-05-07 DIAGNOSIS — N1831 Chronic kidney disease, stage 3a: Secondary | ICD-10-CM

## 2020-05-07 LAB — GLUCOSE, CAPILLARY
Glucose-Capillary: 115 mg/dL — ABNORMAL HIGH (ref 70–99)
Glucose-Capillary: 158 mg/dL — ABNORMAL HIGH (ref 70–99)
Glucose-Capillary: 165 mg/dL — ABNORMAL HIGH (ref 70–99)

## 2020-05-07 MED ORDER — ALBUTEROL SULFATE HFA 108 (90 BASE) MCG/ACT IN AERS: 2.0000 | INHALATION_SPRAY | Freq: Four times a day (QID) | RESPIRATORY_TRACT | 0 refills | Status: AC | PRN

## 2020-05-07 MED ORDER — OMEPRAZOLE 40 MG PO CPDR: 40.0000 mg | DELAYED_RELEASE_CAPSULE | Freq: Every day | ORAL | 0 refills | Status: DC

## 2020-05-07 MED ORDER — LANTUS SOLOSTAR 100 UNIT/ML ~~LOC~~ SOPN: 25.0000 [IU] | PEN_INJECTOR | Freq: Every day | SUBCUTANEOUS | 0 refills | Status: DC

## 2020-05-07 MED ORDER — INSULIN LISPRO (1 UNIT DIAL) 100 UNIT/ML (KWIKPEN): 6.0000 [IU] | PEN_INJECTOR | Freq: Three times a day (TID) | SUBCUTANEOUS | 0 refills | Status: DC

## 2020-05-07 MED ORDER — INSULIN PEN NEEDLE 32G X 4 MM MISC
1.0000 | Freq: Three times a day (TID) | 0 refills | Status: DC
Start: 2020-05-07 — End: 2020-08-03

## 2020-05-07 MED ORDER — ATORVASTATIN CALCIUM 40 MG PO TABS: 40.0000 mg | ORAL_TABLET | Freq: Every day | ORAL | 0 refills | Status: DC

## 2020-05-07 MED ORDER — AMLODIPINE BESYLATE 10 MG PO TABS: 10.0000 mg | ORAL_TABLET | Freq: Every day | ORAL | 0 refills | Status: DC

## 2020-05-07 MED ORDER — LOSARTAN POTASSIUM 50 MG PO TABS: 50.0000 mg | ORAL_TABLET | Freq: Every day | ORAL | 0 refills | Status: DC

## 2020-05-07 MED ORDER — MIRTAZAPINE 30 MG PO TABS: 30.0000 mg | ORAL_TABLET | Freq: Every day | ORAL | 0 refills | Status: DC

## 2020-05-07 NOTE — TOC Progression Note (Signed)
Transition of Care [facility]) - Progression Note    Patient Details  Name: Erica Mercer MRN: [id number] Date of Birth: 10/09/1955  Transition of Care [facility]) CM/SW [place], RN Phone Number: 05/07/2020, 10:33 AM  Clinical Narrative:  [facility] and spoke with Suezanne Jacquet, Arranged Transport for the patient to get a ride home, the driver will be here out front at the medical mall to pick her up in 20 min.      Expected Discharge Plan: Home/Self Care Barriers to Discharge: Continued Medical Work up  Expected Discharge Plan and Services Expected Discharge Plan: Home/Self Care   Discharge Planning Services: CM Consult   Living arrangements for the past 2 months: Single Family Home Expected Discharge Date: 05/07/20               DME Arranged: 3-N-1,Walker rolling with seat DME Agency: [employer] Date DME Agency Contacted: 05/07/20 Time DME Agency Contacted: [phone] Representative spoke with at DME Agency: [place] Arranged: NA           Social Determinants of Health ([place]) Interventions    Readmission Risk Interventions No flowsheet data found.

## 2020-05-07 NOTE — Discharge Summary (Signed)
[facility] [place] NAME: Erica Mercer    MR#:  [id number]  DATE OF BIRTH:  Oct 07, 1955  DATE OF ADMISSION:  05/05/2020 ADMITTING PHYSICIAN: Sidney Ace, MD  DATE OF DISCHARGE: 05/07/2020 12:01 PM  PRIMARY CARE PHYSICIAN: [name], NP    ADMISSION DIAGNOSIS:  DKA (diabetic ketoacidosis) ([place]) [E11.10] Hyperglycemia [R73.9] Hyperosmolar hyperglycemic state (HHS) ([place]) [E11.00, E11.65]  DISCHARGE DIAGNOSIS:  Principal Problem:   DKA, type 1 ([place]) Active Problems:   Essential hypertension   HLD (hyperlipidemia)   COPD (chronic obstructive pulmonary disease) (HCC)   Bipolar 1 disorder (HCC)   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HCC)   CAD (coronary artery disease)   Hyperosmolar hyperglycemic state (HHS) ([place])   Abdominal pain   GERD (gastroesophageal reflux disease)   Tobacco abuse   Left arm numbness   Depression   SECONDARY DIAGNOSIS:   Past Medical History:  Diagnosis Date  . Bipolar 1 disorder ([place])   . CAD (coronary artery disease)   . Diabetes mellitus without complication ([place])   . HLD (hyperlipidemia)   . HTN (hypertension)     HOSPITAL COURSE:   1.  Diabetic ketoacidosis with type 1 diabetes mellitus.  The patient states that she has been trying to conserve her insulin because she is low on it.  I told her that she must use her insulin as directed or if she will end up in the hospital.  I refilled her short acting insulin and long-acting insulin over 1 month supply.  25 units of Lantus daily and Humalog 6 units prior to meals 2.  Essential hypertension refilled amlodipine and losartan 3.  Hyperlipidemia unspecified.  Refilled Lipitor 4.  COPD refilled albuterol 5.  Bipolar disorder on Remeron and Rexulti 6.  History of CAD on Lipitor and aspirin 7.  Abdominal pain resolved likely secondary to DKA 8.  GERD on Protonix 9.  Tobacco abuse on nicotine patch 10.  Chronic kidney disease stage  IIIa  DISCHARGE CONDITIONS:   Satisfactory  CONSULTS OBTAINED:  None  DRUG ALLERGIES:  No Known Allergies  DISCHARGE MEDICATIONS:   Allergies as of 05/07/2020   No Known Allergies     Medication List    TAKE these medications   albuterol 108 (90 Base) MCG/ACT inhaler Commonly known as: VENTOLIN HFA Inhale 2 puffs into the lungs every 6 (six) hours as needed for wheezing.   amLODipine 10 MG tablet Commonly known as: NORVASC Take 1 tablet (10 mg total) by mouth daily.   atorvastatin 40 MG tablet Commonly known as: LIPITOR Take 1 tablet (40 mg total) by mouth at bedtime.   docusate sodium 100 MG capsule Commonly known as: Colace Take 1 capsule (100 mg total) by mouth 2 (two) times daily.   insulin lispro 100 UNIT/ML KwikPen Commonly known as: HUMALOG Inject 6 Units into the skin 3 (three) times daily with meals.   Insulin Pen Needle 32G X 4 MM Misc 1 Dose by Does not apply route 4 (four) times daily -  before meals and at bedtime.   Lantus SoloStar 100 UNIT/ML Solostar Pen Generic drug: insulin glargine Inject 25 Units into the skin daily. What changed: how much to take   losartan 50 MG tablet Commonly known as: COZAAR Take 1 tablet (50 mg total) by mouth daily.   mirtazapine 30 MG tablet Commonly known as: REMERON Take 1 tablet (30 mg total) by mouth at bedtime.   nicotine polacrilex 4 MG  gum Commonly known as: NICORETTE Take by mouth.   omeprazole 40 MG capsule Commonly known as: PRILOSEC Take 1 capsule (40 mg total) by mouth daily.   ondansetron 4 MG disintegrating tablet Commonly known as: Zofran ODT Take 1 tablet (4 mg total) by mouth every 6 (six) hours as needed for nausea or vomiting.   pregabalin 50 MG capsule Commonly known as: LYRICA Take 50 mg by mouth 2 (two) times daily.   Rexulti 1 MG Tabs tablet Generic drug: brexpiprazole Take 1 mg by mouth every morning.   traMADol 50 MG tablet Commonly known as: Ultram Take 1 tablet (50 mg  total) by mouth every 6 (six) hours as needed.            Durable Medical Equipment  (From admission, onward)         Start     Ordered   05/07/20 0944  For home use only DME 4 wheeled rolling walker with seat  Once       Question:  Patient needs a walker to treat with the following condition  Answer:  Unsteady gait when walking   05/07/20 0943   05/07/20 0944  For home use only DME 3 n 1  Once        05/07/20 0943           DISCHARGE INSTRUCTIONS:   Follow-up PMD 5 days  If you experience worsening of your admission symptoms, develop shortness of breath, life threatening emergency, suicidal or homicidal thoughts you must seek medical attention immediately by calling 911 or calling your MD immediately  if symptoms less severe.  You Must read complete instructions/literature along with all the possible adverse reactions/side effects for all the Medicines you take and that have been prescribed to you. Take any new Medicines after you have completely understood and accept all the possible adverse reactions/side effects.   Please note  You were cared for by a hospitalist during your hospital stay. If you have any questions about your discharge medications or the care you received while you were in the hospital after you are discharged, you can call the unit and asked to speak with the hospitalist on call if the hospitalist that took care of you is not available. Once you are discharged, your primary care physician will handle any further medical issues. Please note that NO REFILLS for any discharge medications will be authorized once you are discharged, as it is imperative that you return to your primary care physician (or establish a relationship with a primary care physician if you do not have one) for your aftercare needs so that they can reassess your need for medications and monitor your lab values.    Today   CHIEF COMPLAINT:   Chief Complaint  Patient presents with  .  Abdominal Pain    HISTORY OF PRESENT ILLNESS:  Erica Mercer  is a 65 y.o. female came in not feeling well with abdominal pain and found to be in diabetic ketoacidosis   VITAL SIGNS:  Blood pressure (!) 151/91, pulse 85, temperature 98.3 F (36.8 C), resp. rate 18, height 5\' 2"  (1.575 m), weight 47.8 kg, SpO2 100 %.  I/O:    Intake/Output Summary (Last 24 hours) at 05/07/2020 1608 Last data filed at 05/07/2020 1032 Gross per 24 hour  Intake 1117.79 ml  Output 300 ml  Net 817.79 ml    PHYSICAL EXAMINATION:  GENERAL:  65 y.o.-year-old patient lying in the bed with no acute distress.  EYES:  Pupils equal, round, reactive to light and accommodation. No scleral icterus. HEENT: Head atraumatic, normocephalic. Oropharynx and nasopharynx clear.   LUNGS: Normal breath sounds bilaterally, no wheezing, rales,rhonchi or crepitation. No use of accessory muscles of respiration.  CARDIOVASCULAR: S1, S2 normal. No murmurs, rubs, or gallops.  ABDOMEN: Soft, non-tender, non-distended. EXTREMITIES: No pedal edema.  NEUROLOGIC: Cranial nerves II through XII are intact. Muscle strength 5/5 in all extremities. Sensation intact. Gait not checked.  PSYCHIATRIC: The patient is alert and oriented x 3.  SKIN: No obvious rash, lesion, or ulcer.   DATA REVIEW:   CBC Recent Labs  Lab 05/06/20 0552  WBC 8.8  HGB 9.7*  HCT 28.5*  PLT 214    Chemistries  Recent Labs  Lab 04/30/20 2019 05/01/20 0158 05/05/20 1200 05/05/20 1752 05/06/20 0552  NA 139   < > 136   < > 137  K 4.3   < > 3.9   < > 4.0  CL 101   < > 97*   < > 104  CO2 29   < > 22   < > 26  GLUCOSE 116*   < > 653*   < > 330*  BUN 35*   < > 37*   < > 37*  CREATININE 1.18*   < > 1.23*   < > 1.31*  CALCIUM 9.8   < > 8.9   < > 8.7*  MG 1.9  --   --   --   --   AST  --   --  26  --   --   ALT  --   --  18  --   --   ALKPHOS  --   --  108  --   --   BILITOT  --   --  1.0  --   --    < > = values in this interval not displayed.     Microbiology Results  Results for orders placed or performed during the hospital encounter of 05/05/20  Resp Panel by RT-PCR (Flu A&B, Covid) Nasopharyngeal Swab     Status: None   Collection Time: 05/05/20  1:21 PM   Specimen: Nasopharyngeal Swab; Nasopharyngeal(NP) swabs in vial transport medium  Result Value Ref Range Status   SARS Coronavirus 2 by RT PCR NEGATIVE NEGATIVE Final    Comment: (NOTE) SARS-CoV-2 target nucleic acids are NOT DETECTED.  The SARS-CoV-2 RNA is generally detectable in upper respiratory specimens during the acute phase of infection. The lowest concentration of SARS-CoV-2 viral copies this assay can detect is 138 copies/mL. A negative result does not preclude SARS-Cov-2 infection and should not be used as the sole basis for treatment or other patient management decisions. A negative result may occur with  improper specimen collection/handling, submission of specimen other than nasopharyngeal swab, presence of viral mutation(s) within the areas targeted by this assay, and inadequate number of viral copies(<138 copies/mL). A negative result must be combined with clinical observations, patient history, and epidemiological information. The expected result is Negative.  Fact Sheet for Patients:  EntrepreneurPulse.com.au  Fact Sheet for Healthcare Providers:  IncredibleEmployment.be  This test is no t yet approved or cleared by the [place] FDA and  has been authorized for detection and/or diagnosis of SARS-CoV-2 by FDA under an Emergency Use Authorization (EUA). This EUA will remain  in effect (meaning this test can be used) for the duration of the COVID-19 declaration under Section 564(b)(1) of the Act, 21 U.S.C.section 360bbb-3(b)(1), unless  the authorization is terminated  or revoked sooner.       Influenza A by PCR NEGATIVE NEGATIVE Final   Influenza B by PCR NEGATIVE NEGATIVE Final    Comment: (NOTE) The  Xpert Xpress SARS-CoV-2/FLU/RSV plus assay is intended as an aid in the diagnosis of influenza from Nasopharyngeal swab specimens and should not be used as a sole basis for treatment. Nasal washings and aspirates are unacceptable for Xpert Xpress SARS-CoV-2/FLU/RSV testing.  Fact Sheet for Patients: EntrepreneurPulse.com.au  Fact Sheet for Healthcare Providers: IncredibleEmployment.be  This test is not yet approved or cleared by the [place] FDA and has been authorized for detection and/or diagnosis of SARS-CoV-2 by FDA under an Emergency Use Authorization (EUA). This EUA will remain in effect (meaning this test can be used) for the duration of the COVID-19 declaration under Section 564(b)(1) of the Act, 21 U.S.C. section 360bbb-3(b)(1), unless the authorization is terminated or revoked.  Performed at [facility], [address], [place], [place] [address]     RADIOLOGY:  MR BRAIN WO CONTRAST  Result Date: 05/05/2020 CLINICAL DATA:  Initial evaluation for neuro deficit, stroke suspected. EXAM: MRI HEAD WITHOUT CONTRAST TECHNIQUE: Multiplanar, multiecho pulse sequences of the brain and surrounding structures were obtained without intravenous contrast. COMPARISON:  Prior head CT from earlier same day. FINDINGS: Brain: Examination degraded by motion. Cerebral volume within normal limits for age. Patchy T2/FLAIR hyperintensity within the periventricular deep white matter both cerebral hemispheres as well as the pons, most consistent with chronic small vessel ischemic disease, mild to moderate in nature. No abnormal foci of restricted diffusion to suggest acute or subacute ischemia. Gray-white matter differentiation maintained. No encephalomalacia to suggest chronic cortical infarction. No evidence for acute or chronic intracranial hemorrhage. No mass lesion, midline shift or mass effect. No hydrocephalus or extra-axial fluid collection.  Pituitary gland suprasellar region normal. Midline structures intact. Vascular: Major intracranial vascular flow voids are maintained. Skull and upper cervical spine: Craniocervical junction normal. Bone marrow signal intensity within normal limits. No scalp soft tissue abnormality. Sinuses/Orbits: Globes and orbital soft tissues within normal limits. Moderate mucosal thickening noted within the right maxillary sinus. Additional mild mucosal thickening noted elsewhere within the paranasal sinuses. No mastoid effusion. Inner ear structures grossly normal. Other: None. IMPRESSION: 1. No acute intracranial abnormality. 2. Mild to moderate chronic microvascular ischemic disease. Electronically Signed   By: Jeannine Boga M.D.   On: 05/05/2020 23:38    Management plans discussed with the patient, and she is in agreement.  Patient refused me calling family at the time of discharge.  CODE STATUS:     Code Status Orders  (From admission, onward)         Start     Ordered   05/05/20 1354  Full code  Continuous        05/05/20 1356        Code Status History    Date Active Date Inactive Code Status Order ID Comments User Context   04/30/2020 1245 05/01/2020 2012 Full Code [id number]  Criss Alvine, DO ED   02/25/2020 1649 02/27/2020 2115 DNR [id number]  CoxBriant Cedar, DO ED   Advance Care Planning Activity      TOTAL TIME TAKING CARE OF THIS PATIENT: 35 minutes.    Loletha Grayer M.D on 05/07/2020 at 4:08 PM  Between 7am to 6pm - Pager - [phone]  After 6pm go to [email or web address] - password [email or web address]  [facility]  CC: Primary care physician; Christoper Fabian,  Blanchard Kelch, NP

## 2020-05-07 NOTE — TOC Progression Note (Signed)
Transition of Care [facility]) - Progression Note    Patient Details  Name: Erica Mercer MRN: [id number] Date of Birth: 07-Dec-1955  Transition of Care [facility]) CM/SW [place], RN Phone Number: 05/07/2020, 11:04 AM  Clinical Narrative:   [facility] [employer] to check on the transportation, the driver will be arriving in a black hyn[employer] senata driver's name is Erica Mercer, Notified the CNA [place]    Expected Discharge Plan: Home/Self Care Barriers to Discharge: Continued Medical Work up  Expected Discharge Plan and Services Expected Discharge Plan: Home/Self Care   Discharge Planning Services: CM Consult   Living arrangements for the past 2 months: Single Family Home Expected Discharge Date: 05/07/20               DME Arranged: 3-N-1,Walker rolling with seat DME Agency: [employer] Date DME Agency Contacted: 05/07/20 Time DME Agency Contacted: [phone] Representative spoke with at DME Agency: [place] Arranged: NA           Social Determinants of Health (SDOH) Interventions    Readmission Risk Interventions No flowsheet data found.

## 2020-05-07 NOTE — Progress Notes (Addendum)
Met with the patient at the bedside and discussed DC plan and needs She lives at her daughters house with her daughter, she would like to go to an assisted living facility I explained that her insurance does not cover the assisted living facility and that it would be an out of pocket expense. I explained that even for [facility] home her insurance does not cover it, I provided her with the website to apply for long term Medicaid She stated that she would like to have a 3 in 1 and a rollator, I notified Suanne Marker S from [employer] and they will run it against her insurance to see what is covered and what the out of pocket expense will be, they will call the patient or go to the patient's room to notify of the cost and or provide the DME, When asked if she wants a Home health Nurse for a short time to teach about meds, she said that would not be helpful for her. She has no additional needs at this time

## 2020-05-08 LAB — GLUCOSE, CAPILLARY
Glucose-Capillary: 516 mg/dL (ref 70–99)
Glucose-Capillary: 23 mg/dL — CL (ref 70–99)

## 2020-05-21 ENCOUNTER — Emergency Department: Payer: 59

## 2020-05-21 ENCOUNTER — Observation Stay
Admission: EM | Admit: 2020-05-21 | Discharge: 2020-05-22 | Disposition: A | Discharge status: Home / Self Care | Payer: 59 | Attending: Hospitalist | Admitting: Hospitalist

## 2020-05-21 ENCOUNTER — Other Ambulatory Visit: Payer: Self-pay

## 2020-05-21 ENCOUNTER — Encounter: Payer: Self-pay | Admitting: Emergency Medicine

## 2020-05-21 DIAGNOSIS — R739 Hyperglycemia, unspecified: Secondary | ICD-10-CM | POA: Diagnosis not present

## 2020-05-21 DIAGNOSIS — Z79899 Other long term (current) drug therapy: Secondary | ICD-10-CM | POA: Diagnosis not present

## 2020-05-21 DIAGNOSIS — R1013 Epigastric pain: Secondary | ICD-10-CM

## 2020-05-21 DIAGNOSIS — E1122 Type 2 diabetes mellitus with diabetic chronic kidney disease: Secondary | ICD-10-CM | POA: Diagnosis not present

## 2020-05-21 DIAGNOSIS — Z794 Long term (current) use of insulin: Secondary | ICD-10-CM | POA: Insufficient documentation

## 2020-05-21 DIAGNOSIS — I129 Hypertensive chronic kidney disease with stage 1 through stage 4 chronic kidney disease, or unspecified chronic kidney disease: Secondary | ICD-10-CM | POA: Insufficient documentation

## 2020-05-21 DIAGNOSIS — E1165 Type 2 diabetes mellitus with hyperglycemia: Principal | ICD-10-CM | POA: Insufficient documentation

## 2020-05-21 DIAGNOSIS — Z20822 Contact with and (suspected) exposure to covid-19: Secondary | ICD-10-CM | POA: Diagnosis not present

## 2020-05-21 DIAGNOSIS — I2510 Atherosclerotic heart disease of native coronary artery without angina pectoris: Secondary | ICD-10-CM | POA: Diagnosis not present

## 2020-05-21 DIAGNOSIS — J449 Chronic obstructive pulmonary disease, unspecified: Secondary | ICD-10-CM | POA: Diagnosis not present

## 2020-05-21 DIAGNOSIS — F17200 Nicotine dependence, unspecified, uncomplicated: Secondary | ICD-10-CM | POA: Diagnosis not present

## 2020-05-21 DIAGNOSIS — N1831 Chronic kidney disease, stage 3a: Secondary | ICD-10-CM | POA: Diagnosis not present

## 2020-05-21 LAB — CBC
HCT: 34.6 % — ABNORMAL LOW (ref 36.0–46.0)
HCT: 32.9 % — ABNORMAL LOW (ref 36.0–46.0)
Hemoglobin: 11.6 g/dL — ABNORMAL LOW (ref 12.0–15.0)
Hemoglobin: 11 g/dL — ABNORMAL LOW (ref 12.0–15.0)
MCH: 33 pg (ref 26.0–34.0)
MCH: 32.5 pg (ref 26.0–34.0)
MCHC: 33.5 g/dL (ref 30.0–36.0)
MCHC: 33.4 g/dL (ref 30.0–36.0)
MCV: 98.3 fL (ref 80.0–100.0)
MCV: 97.3 fL (ref 80.0–100.0)
Platelets: 340 10*3/uL (ref 150–400)
Platelets: 366 10*3/uL (ref 150–400)
RBC: 3.52 MIL/uL — ABNORMAL LOW (ref 3.87–5.11)
RBC: 3.38 MIL/uL — ABNORMAL LOW (ref 3.87–5.11)
RDW: 13.8 % (ref 11.5–15.5)
RDW: 13.3 % (ref 11.5–15.5)
WBC: 6.2 10*3/uL (ref 4.0–10.5)
WBC: 6.9 10*3/uL (ref 4.0–10.5)
nRBC: 0 % (ref 0.0–0.2)
nRBC: 0 % (ref 0.0–0.2)

## 2020-05-21 LAB — URINE DRUG SCREEN, QUALITATIVE (ARMC ONLY)
Amphetamines, Ur Screen: NOT DETECTED
Barbiturates, Ur Screen: NOT DETECTED
Benzodiazepine, Ur Scrn: NOT DETECTED
Cannabinoid 50 Ng, Ur ~~LOC~~: POSITIVE — AB
Cocaine Metabolite,Ur ~~LOC~~: NOT DETECTED
MDMA (Ecstasy)Ur Screen: NOT DETECTED
Methadone Scn, Ur: NOT DETECTED
Opiate, Ur Screen: NOT DETECTED
Phencyclidine (PCP) Ur S: NOT DETECTED
Tricyclic, Ur Screen: NOT DETECTED

## 2020-05-21 LAB — URINALYSIS, COMPLETE (UACMP) WITH MICROSCOPIC
Bacteria, UA: NONE SEEN
Bilirubin Urine: NEGATIVE
Glucose, UA: >=500 mg/dL — AB
Hgb urine dipstick: NEGATIVE
Ketones, ur: 5 mg/dL — AB
Leukocytes,Ua: NEGATIVE
Nitrite: NEGATIVE
Protein, ur: 100 mg/dL — AB
Specific Gravity, Urine: 1.013 (ref 1.005–1.030)
Squamous Epithelial / HPF: NONE SEEN (ref 0–5)
pH: 7 (ref 5.0–8.0)

## 2020-05-21 LAB — CBG MONITORING, ED
Glucose-Capillary: >600 mg/dL (ref 70–99)
Glucose-Capillary: >600 mg/dL (ref 70–99)
Glucose-Capillary: 434 mg/dL — ABNORMAL HIGH (ref 70–99)
Glucose-Capillary: 561 mg/dL (ref 70–99)
Glucose-Capillary: 188 mg/dL — ABNORMAL HIGH (ref 70–99)
Glucose-Capillary: 133 mg/dL — ABNORMAL HIGH (ref 70–99)

## 2020-05-21 LAB — COMPREHENSIVE METABOLIC PANEL
ALT: 16 U/L (ref 0–44)
AST: 21 U/L (ref 15–41)
Albumin: 3 g/dL — ABNORMAL LOW (ref 3.5–5.0)
Alkaline Phosphatase: 136 U/L — ABNORMAL HIGH (ref 38–126)
Anion gap: 11 (ref 5–15)
BUN: 31 mg/dL — ABNORMAL HIGH (ref 8–23)
CO2: 21 mmol/L — ABNORMAL LOW (ref 22–32)
Calcium: 9 mg/dL (ref 8.9–10.3)
Chloride: 99 mmol/L (ref 98–111)
Creatinine, Ser: 1.19 mg/dL — ABNORMAL HIGH (ref 0.44–1.00)
GFR, Estimated: 51 mL/min — ABNORMAL LOW (ref >60)
Glucose, Bld: 692 mg/dL (ref 70–99)
Potassium: 4.7 mmol/L (ref 3.5–5.1)
Sodium: 131 mmol/L — ABNORMAL LOW (ref 135–145)
Total Bilirubin: 0.8 mg/dL (ref 0.3–1.2)
Total Protein: 6.3 g/dL — ABNORMAL LOW (ref 6.5–8.1)

## 2020-05-21 LAB — BASIC METABOLIC PANEL
Anion gap: 11 (ref 5–15)
BUN: 31 mg/dL — ABNORMAL HIGH (ref 8–23)
CO2: 25 mmol/L (ref 22–32)
Calcium: 9.8 mg/dL (ref 8.9–10.3)
Chloride: 104 mmol/L (ref 98–111)
Creatinine, Ser: 1.18 mg/dL — ABNORMAL HIGH (ref 0.44–1.00)
GFR, Estimated: 51 mL/min — ABNORMAL LOW (ref >60)
Glucose, Bld: 158 mg/dL — ABNORMAL HIGH (ref 70–99)
Potassium: 3.5 mmol/L (ref 3.5–5.1)
Sodium: 140 mmol/L (ref 135–145)

## 2020-05-21 LAB — GLUCOSE, CAPILLARY: Glucose-Capillary: 274 mg/dL — ABNORMAL HIGH (ref 70–99)

## 2020-05-21 LAB — BLOOD GAS, VENOUS
Acid-Base Excess: 1.1 mmol/L (ref 0.0–2.0)
Bicarbonate: 26.6 mmol/L (ref 20.0–28.0)
O2 Saturation: 69.3 %
Patient temperature: 37
pCO2, Ven: 45 mmHg (ref 44.0–60.0)
pH, Ven: 7.38 (ref 7.250–7.430)
pO2, Ven: 37 mmHg (ref 32.0–45.0)

## 2020-05-21 LAB — BETA-HYDROXYBUTYRIC ACID: Beta-Hydroxybutyric Acid: 0.44 mmol/L — ABNORMAL HIGH (ref 0.05–0.27)

## 2020-05-21 LAB — LIPASE, BLOOD: Lipase: 30 U/L (ref 11–51)

## 2020-05-21 MED ORDER — PREGABALIN 50 MG PO CAPS
50.0000 mg | ORAL_CAPSULE | Freq: Two times a day (BID) | ORAL | Status: DC
  Administered 2020-05-21 – 2020-05-22 (×2): 50 mg via ORAL
  Filled 2020-05-21 (×2): qty 1

## 2020-05-21 MED ORDER — PANTOPRAZOLE SODIUM 40 MG PO TBEC
40.0000 mg | DELAYED_RELEASE_TABLET | Freq: Every day | ORAL | Status: DC
  Administered 2020-05-22: 40 mg via ORAL
  Filled 2020-05-21: qty 1

## 2020-05-21 MED ORDER — INSULIN ASPART 100 UNIT/ML ~~LOC~~ SOLN: 0.0000 [IU] | Freq: Three times a day (TID) | SUBCUTANEOUS | Status: DC

## 2020-05-21 MED ORDER — MORPHINE SULFATE (PF) 2 MG/ML IV SOLN
2.0000 mg | INTRAVENOUS | Status: DC | PRN
Start: 2020-05-21 — End: 2020-05-22
  Administered 2020-05-21 – 2020-05-22 (×3): 2 mg via INTRAVENOUS
  Filled 2020-05-21 (×3): qty 1

## 2020-05-21 MED ORDER — LOSARTAN POTASSIUM 50 MG PO TABS
50.0000 mg | ORAL_TABLET | Freq: Every day | ORAL | Status: DC
  Administered 2020-05-21 – 2020-05-22 (×2): 50 mg via ORAL
  Filled 2020-05-21 (×2): qty 1

## 2020-05-21 MED ORDER — DEXTROSE 50 % IV SOLN: 0.0000 mL | INTRAVENOUS | Status: DC | PRN

## 2020-05-21 MED ORDER — LACTATED RINGERS IV BOLUS
1000.0000 mL | Freq: Once | INTRAVENOUS | Status: AC
  Administered 2020-05-21: 1000 mL via INTRAVENOUS

## 2020-05-21 MED ORDER — ATORVASTATIN CALCIUM 20 MG PO TABS
40.0000 mg | ORAL_TABLET | Freq: Every day | ORAL | Status: DC
  Administered 2020-05-21: 40 mg via ORAL
  Filled 2020-05-21: qty 2

## 2020-05-21 MED ORDER — ENOXAPARIN SODIUM 40 MG/0.4ML ~~LOC~~ SOLN
40.0000 mg | SUBCUTANEOUS | Status: DC
  Administered 2020-05-21: 40 mg via SUBCUTANEOUS
  Filled 2020-05-21: qty 0.4

## 2020-05-21 MED ORDER — IOHEXOL 300 MG/ML  SOLN
75.0000 mL | Freq: Once | INTRAMUSCULAR | Status: AC | PRN
  Administered 2020-05-21: 75 mL via INTRAVENOUS

## 2020-05-21 MED ORDER — BREXPIPRAZOLE 1 MG PO TABS
1.0000 mg | ORAL_TABLET | Freq: Every morning | ORAL | Status: DC
  Administered 2020-05-22: 1 mg via ORAL
  Filled 2020-05-21: qty 1

## 2020-05-21 MED ORDER — SENNOSIDES-DOCUSATE SODIUM 8.6-50 MG PO TABS
2.0000 | ORAL_TABLET | Freq: Every day | ORAL | Status: DC
  Administered 2020-05-21: 2 via ORAL
  Filled 2020-05-21: qty 2

## 2020-05-21 MED ORDER — INSULIN GLARGINE 100 UNIT/ML ~~LOC~~ SOLN
10.0000 [IU] | Freq: Every day | SUBCUTANEOUS | Status: DC
  Administered 2020-05-21 – 2020-05-22 (×2): 10 [IU] via SUBCUTANEOUS
  Filled 2020-05-21 (×2): qty 0.1

## 2020-05-21 MED ORDER — LACTATED RINGERS IV SOLN: INTRAVENOUS | Status: DC

## 2020-05-21 MED ORDER — AMLODIPINE BESYLATE 10 MG PO TABS
10.0000 mg | ORAL_TABLET | Freq: Every day | ORAL | Status: DC
  Administered 2020-05-21 – 2020-05-22 (×2): 10 mg via ORAL
  Filled 2020-05-21: qty 2
  Filled 2020-05-21: qty 1

## 2020-05-21 MED ORDER — SODIUM CHLORIDE 0.9 % IV SOLN: INTRAVENOUS | Status: DC

## 2020-05-21 MED ORDER — ONDANSETRON HCL 4 MG/2ML IJ SOLN
4.0000 mg | Freq: Once | INTRAMUSCULAR | Status: AC
  Administered 2020-05-21: 4 mg via INTRAVENOUS
  Filled 2020-05-21: qty 2

## 2020-05-21 MED ORDER — MORPHINE SULFATE (PF) 4 MG/ML IV SOLN
4.0000 mg | Freq: Once | INTRAVENOUS | Status: AC
Start: 2020-05-21 — End: 2020-05-21
  Administered 2020-05-21: 4 mg via INTRAVENOUS
  Filled 2020-05-21: qty 1

## 2020-05-21 MED ORDER — ACETAMINOPHEN 325 MG PO TABS: 650.0000 mg | ORAL_TABLET | Freq: Four times a day (QID) | ORAL | Status: DC | PRN

## 2020-05-21 MED ORDER — HYDRALAZINE HCL 20 MG/ML IJ SOLN
5.0000 mg | Freq: Four times a day (QID) | INTRAMUSCULAR | Status: DC | PRN
  Filled 2020-05-21: qty 1

## 2020-05-21 MED ORDER — MIRTAZAPINE 15 MG PO TABS
30.0000 mg | ORAL_TABLET | Freq: Every day | ORAL | Status: DC
  Administered 2020-05-21: 30 mg via ORAL
  Filled 2020-05-21: qty 2

## 2020-05-21 MED ORDER — INSULIN ASPART 100 UNIT/ML ~~LOC~~ SOLN
0.0000 [IU] | Freq: Every day | SUBCUTANEOUS | Status: DC
  Administered 2020-05-21: 3 [IU] via SUBCUTANEOUS
  Filled 2020-05-21: qty 1

## 2020-05-21 MED ORDER — ONDANSETRON HCL 4 MG/2ML IJ SOLN: 4.0000 mg | Freq: Four times a day (QID) | INTRAMUSCULAR | Status: DC | PRN

## 2020-05-21 MED ORDER — INSULIN REGULAR(HUMAN) IN NACL 100-0.9 UT/100ML-% IV SOLN
INTRAVENOUS | Status: DC
  Administered 2020-05-21: 7 [IU]/h via INTRAVENOUS
  Filled 2020-05-21: qty 100

## 2020-05-21 MED ORDER — DEXTROSE IN LACTATED RINGERS 5 % IV SOLN: INTRAVENOUS | Status: DC

## 2020-05-21 NOTE — H&P (Addendum)
History and Physical  Erica Mercer ZOX:[id number] DOB: 07/06/1955 DOA: 05/21/2020  Referring physician: Dr. Charna Archer, [place] PCP: [name], NP  Outpatient Specialists: None. Patient coming from: Home.  Chief Complaint: Abdominal pain, nausea and vomiting.  HPI: Erica Mercer is a 65 y.o. female with medical history significant for uncontrolled insulin-dependent type 2 diabetes, frequent hospitalizations, discharged 2 weeks ago for DKA, tobacco use disorder, marijuana use disorder, essential hypertension, hyperlipidemia, GERD, polyneuropathy, ambulatory dysfunction uses a walker at baseline, who presented to [facility] ED with complaints of worsening severe epigastric pain associated with nausea and vomiting x1 day.  Reports initial onset was on Monday 3 days ago, resolved spontaneously then recurred again yesterday, symptoms were moderate at that time and resolved.  This morning the pain recurred and was severe.  Associated with loss of appetite, nausea, dry heaving.  She denies fevers, admits to feeling chills all the time.  Denies constipation.  Admits to polyuria with polydipsia.  She denies running out of her insulin.  States she has been compliant with insulin use despite her nausea and vomiting.  She takes Lantus 22 units daily and Humalog 6 units with meals 3 times daily.  In the ED CT abdomen and pelvis with contrast was unrevealing.  UA negative for pyuria however it was positive for glucosuria and proteinuria.  Serum glucose was greater than 600 with serum bicarb of 21.  EDP requested admission for hyperglycemia.  She was started on Endotool protocol by EDP.  TRH, hospitalist team, was asked to admit.  ED Course:  Afebrile.  BP 192/120, pulse rate 90, respiratory 24, O2 saturation 99% on room air.  Lab studies remarkable for findings stated above.  Review of Systems: Review of systems as noted in the HPI. All other systems reviewed and are negative.   Past Medical History:  Diagnosis  Date  . Bipolar 1 disorder ([place])   . CAD (coronary artery disease)   . Diabetes mellitus without complication ([place])   . HLD (hyperlipidemia)   . HTN (hypertension)    Past Surgical History:  Procedure Laterality Date  . COLONOSCOPY    . ESOPHAGOGASTRODUODENOSCOPY      Social History:  reports that she has been smoking. She has never used smokeless tobacco. She reports previous alcohol use. She reports current drug use. Frequency: 3.00 times per week. Drug: Marijuana.   No Known Allergies  Family History  Problem Relation Age of Onset  . Heart disease Mother   . Prostate cancer Father     Mother with history of diabetes.  Prior to Admission medications   Medication Sig Start Date End Date Taking? Authorizing Provider  albuterol (VENTOLIN HFA) 108 (90 Base) MCG/ACT inhaler Inhale 2 puffs into the lungs every 6 (six) hours as needed for wheezing. 05/07/20 05/02/21  Loletha Grayer, MD  amLODipine (NORVASC) 10 MG tablet Take 1 tablet (10 mg total) by mouth daily. 05/07/20 05/02/21  Loletha Grayer, MD  atorvastatin (LIPITOR) 40 MG tablet Take 1 tablet (40 mg total) by mouth at bedtime. 05/07/20 05/02/21  Loletha Grayer, MD  docusate sodium (COLACE) 100 MG capsule Take 1 capsule (100 mg total) by mouth 2 (two) times daily. 03/17/20 06/15/20  Harvest Dark, MD  insulin glargine (LANTUS SOLOSTAR) 100 UNIT/ML Solostar Pen Inject 25 Units into the skin daily. 05/07/20 05/02/21  Loletha Grayer, MD  insulin lispro (HUMALOG) 100 UNIT/ML KwikPen Inject 6 Units into the skin 3 (three) times daily with meals. 05/07/20   Loletha Grayer, MD  Insulin Pen  Needle 32G X 4 MM MISC 1 Dose by Does not apply route 4 (four) times daily -  before meals and at bedtime. 05/07/20   Loletha Grayer, MD  losartan (COZAAR) 50 MG tablet Take 1 tablet (50 mg total) by mouth daily. 05/07/20 05/02/21  Loletha Grayer, MD  mirtazapine (REMERON) 30 MG tablet Take 1 tablet (30 mg total) by mouth at bedtime. 05/07/20    Loletha Grayer, MD  nicotine polacrilex (NICORETTE) 4 MG gum Take by mouth. 01/24/20   [provider]  omeprazole (PRILOSEC) 40 MG capsule Take 1 capsule (40 mg total) by mouth daily. 05/07/20 08/05/20  Loletha Grayer, MD  ondansetron (ZOFRAN ODT) 4 MG disintegrating tablet Take 1 tablet (4 mg total) by mouth every 6 (six) hours as needed for nausea or vomiting. 04/25/20   Delman Kitten, MD  pregabalin (LYRICA) 50 MG capsule Take 50 mg by mouth 2 (two) times daily. 11/09/19   [provider]  REXULTI 1 MG TABS tablet Take 1 mg by mouth every morning. 03/11/20   [provider]  traMADol (ULTRAM) 50 MG tablet Take 1 tablet (50 mg total) by mouth every 6 (six) hours as needed. 03/17/20   Harvest Dark, MD    Physical Exam: BP (!) 193/86   Pulse 88   Temp 98.2 F (36.8 C) (Oral)   Resp (!) 24   Ht 5\' 2"  (1.575 m)   Wt 48.1 kg   SpO2 94%   BMI 19.39 kg/m   . General: 65 y.o. year-old female well developed well nourished in no acute distress.  Alert and oriented x3. . Cardiovascular: Regular rate and rhythm with no rubs or gallops.  No thyromegaly or JVD noted.  No lower extremity edema. 2/4 pulses in all 4 extremities. Marland Kitchen Respiratory: Clear to auscultation with no wheezes or rales. Good inspiratory effort. . Abdomen: Soft epigastric tenderness with palpation, nondistended with normal bowel sounds x4 quadrants. . Muskuloskeletal: No cyanosis, clubbing or edema noted bilaterally . Neuro: CN II-XII intact, strength, sensation, reflexes . Skin: No ulcerative lesions noted or rashes . Psychiatry: Judgement and insight appear normal. Mood is appropriate for condition and setting          Labs on Admission:  Basic Metabolic Panel: Recent Labs  Lab 05/21/20 1115  NA 131*  K 4.7  CL 99  CO2 21*  GLUCOSE 692*  BUN 31*  CREATININE 1.19*  CALCIUM 9.0   Liver Function Tests: Recent Labs  Lab 05/21/20 1115  AST 21  ALT 16  ALKPHOS 136*  BILITOT 0.8   PROT 6.3*  ALBUMIN 3.0*   Recent Labs  Lab 05/21/20 1115  LIPASE 30   No results for input(s): AMMONIA in the last 168 hours. CBC: Recent Labs  Lab 05/21/20 1115  WBC 6.2  HGB 11.6*  HCT 34.6*  MCV 98.3  PLT 340   Cardiac Enzymes: No results for input(s): CKTOTAL, CKMB, CKMBINDEX, TROPONINI in the last 168 hours.  BNP (last 3 results) No results for input(s): BNP in the last 8760 hours.  ProBNP (last 3 results) No results for input(s): PROBNP in the last 8760 hours.  CBG: Recent Labs  Lab 05/21/20 1101 05/21/20 1343  GLUCAP >600* >600*    Radiological Exams on Admission: CT Abdomen Pelvis W Contrast  Result Date: 05/21/2020 CLINICAL DATA:  Progressive abdominal pain.  Hypertension. EXAM: CT ABDOMEN AND PELVIS WITH CONTRAST TECHNIQUE: Multidetector CT imaging of the abdomen and pelvis was performed using the standard protocol following bolus  administration of intravenous contrast. CONTRAST:  64mL OMNIPAQUE IOHEXOL 300 MG/ML  SOLN COMPARISON:  05/05/2020 FINDINGS: Lower chest: Clear lung bases. Mild cardiomegaly. Right coronary artery calcification. Hepatobiliary: Normal liver. Normal gallbladder, without biliary ductal dilatation. Pancreas: Normal, without mass or ductal dilatation. Spleen: Normal in size, without focal abnormality. Adrenals/Urinary Tract: Normal adrenal glands. Right renal calculi of up to 2-3 mm. No hydronephrosis. Left extrarenal pelvis. Normal urinary bladder. Stomach/Bowel: Normal stomach, without wall thickening. Colonic stool burden suggests constipation. Normal terminal ileum. Normal small bowel. Vascular/Lymphatic: Aortic atherosclerosis. No abdominopelvic adenopathy. Reproductive: Retroverted uterus. A fundal fibroid of 3.0 cm on 62/2. No adnexal mass. Other: No significant free fluid. Musculoskeletal: Right femoral head avascular necrosis. IMPRESSION: 1.  No acute process in the abdomen or pelvis. 2. Right nephrolithiasis. 3.  Possible constipation.  4.  Aortic Atherosclerosis (ICD10-I70.0). 5. Age advanced coronary artery atherosclerosis. Recommend assessment of coronary risk factors and consideration of medical therapy. 6. Uterine fibroids 7. Right femoral head avascular necrosis. Electronically Signed   By: Abigail Miyamoto M.D.   On: 05/21/2020 13:29    EKG: I independently viewed the EKG done and my findings are as followed: Sinus tachycardia with a rate of 101.  Nonspecific ST-T changes.  QTc 491.  Assessment/Plan Present on Admission: . Hyperglycemia  Active Problems:   Hyperglycemia  Hyperglycemia in the setting of type 1 diabetes, suspect secondary to diet noncompliance Self reports compliance with her insulin at home. Admitted to the hospital 2 weeks ago for DKA. Presented with polyuria, polydipsia, serum glucose 161, mild metabolic acidosis with serum bicarb 21, anion gap of 11 UA positive for glycosuria and proteinuria Hemoglobin A1c 13.8 on 05/05/2020 She takes Lantus 22 units daily, Humalog 6 units 3 times daily. Started on Endo tool in the ED along with IV fluid hydration. Repeat BMP and obtain beta hydroxybutyrate acid Transition to subcu insulin when appropriate. Diabetes coordinator to assist with diabetes education. Continue IV fluid.  Mild metabolic acidosis with hyperglycemia Presented with serum bicarb of 21 and anion gap of 11 Continue IV fluid hydration. Repeat BMP this afternoon.  Epigastric pain, unclear etiology possibly gastritis Admits to mild use of alcohol, states she drinks Champagne occasionally Reports history of acid reflux Lipase level was normal CT abdomen and pelvis with contrast was nonrevealing. Hemoglobin is stable and up rising 11.6 from 9.7 during her discharge from previous admission on 05/06/2020. Start PPI, monitor symptomatology if no improvement may consider GI consult.  Nausea and vomiting suspect secondary to hyperglycemia Treat underlying condition Continue IV fluid  hydration IV antiemetics as needed  Essential hypertension BP is not at goal Resume home oral antihypertensives IV antihypertensive as needed with parameters.  Proteinuria UA positive for proteinuria Resume home losartan.  Isolated elevated alkaline phosphatase Nonspecific Repeat CMP in the morning  Tobacco use disorder States she is cut down tobacco use, now uses 10 to 11 cigarettes/day Nicotine patch.  Marijuana use disorder States she uses marijuana every night before bed helps with her insomnia and with her appetite. Obtain UDS Polysubstance cessation counseling  Polyneuropathy Resume home regimen  Ambulatory dysfunction Uses a walker at baseline PT OT to assess Fall precautions  Bipolar disorder Resume home regimen.    DVT prophylaxis: Subcu Lovenox daily.  Code Status: Full code as stated by the patient herself.  Family Communication: None at bedside.  Disposition Plan: Admit to MedSurg unit with remote telemetry.  Consults called: None.  Admission status: Observation status.   Status is: Observation  Dispo:  Patient From: Home  Planned Disposition: Home, possibly on 05/22/2020.  Medically stable for discharge: No      Kayleen Memos MD [facility] Pager [phone]  If 7PM-7AM, please contact night-coverage [email or web address] Password [email or web address]  05/21/2020, 2:15 PM

## 2020-05-21 NOTE — ED Triage Notes (Signed)
Pt ems from home for abd pain that has increased to 6/10. CBG reads high. Pt hypertensive here.

## 2020-05-21 NOTE — ED Notes (Signed)
Pt given a sandwich tray ?

## 2020-05-21 NOTE — ED Provider Notes (Signed)
[facility] Emergency Department Provider Note   ____________________________________________   Event Date/Time   First MD Initiated Contact with Patient 05/21/20 1102     (approximate)  I have reviewed the triage vital signs and the nursing notes.   HISTORY  Chief Complaint Abdominal Pain, Hyperglycemia, and Hypertension    HPI Erica Mercer is a 65 y.o. female with past medical history of hypertension, hyperlipidemia, diabetes, COPD, CAD, and CKD who presents to the ED for abdominal pain.  Patient reports that she has had increasing pain in the epigastric part of her abdomen for the past 2 days.  She states pain is constant and sharp, not exacerbated or alleviated by anything.  She has had associated nausea and vomiting, denies any diarrhea.  She has not had any fevers, cough, chest pain, or shortness of breath.  Patient initially evaluated by EMS, who stated her blood sugar read "high".  Patient reports that she has been using her insulin as prescribed, denies any dysuria or hematuria.        Past Medical History:  Diagnosis Date  . Bipolar 1 disorder ([place])   . CAD (coronary artery disease)   . Diabetes mellitus without complication ([place])   . HLD (hyperlipidemia)   . HTN (hypertension)     Patient Active Problem List   Diagnosis Date Noted  . Stage 3a chronic kidney disease ([place])   . CAD (coronary artery disease) 05/05/2020  . Hyperosmolar hyperglycemic state (HHS) ([place]) 05/05/2020  . Abdominal pain 05/05/2020  . GERD (gastroesophageal reflux disease) 05/05/2020  . Tobacco abuse 05/05/2020  . Left arm numbness 05/05/2020  . Depression 05/05/2020  . Hyperglycemia   . AKI (acute kidney injury) ([place]) 04/30/2020  . 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[place]) 04/30/2020  . Severe sepsis with acute organ dysfunction ([place]) 04/30/2020  . DKA, type 1 ([place]) 02/26/2020  . Acute renal failure ([place])   . DKA (diabetic ketoacidosis) ([place])  02/25/2020  . Malnutrition ([place]) 02/25/2020  . Essential hypertension 02/25/2020  . HLD (hyperlipidemia) 02/25/2020  . Soft tissue mass 02/25/2020  . H/O cocaine abuse ([place]) 02/17/2018  . Bipolar 1 disorder ([place]) 04/22/2016  . Old myocardial infarction 04/22/2016  . COPD (chronic obstructive pulmonary disease) ([place]) 01/22/2016    Past Surgical History:  Procedure Laterality Date  . COLONOSCOPY    . ESOPHAGOGASTRODUODENOSCOPY      Prior to Admission medications   Medication Sig Start Date End Date Taking? Authorizing Provider  albuterol (VENTOLIN HFA) 108 (90 Base) MCG/ACT inhaler Inhale 2 puffs into the lungs every 6 (six) hours as needed for wheezing. 05/07/20 05/02/21 Yes Wieting, Richard, MD  amLODipine (NORVASC) 10 MG tablet Take 1 tablet (10 mg total) by mouth daily. 05/07/20 05/02/21 Yes Wieting, Richard, MD  atorvastatin (LIPITOR) 40 MG tablet Take 1 tablet (40 mg total) by mouth at bedtime. 05/07/20 05/02/21 Yes Wieting, Richard, MD  docusate sodium (COLACE) 100 MG capsule Take 1 capsule (100 mg total) by mouth 2 (two) times daily. 03/17/20 06/15/20 Yes Paduchowski, Lennette Bihari, MD  insulin glargine (LANTUS SOLOSTAR) 100 UNIT/ML Solostar Pen Inject 25 Units into the skin daily. 05/07/20 05/02/21 Yes Wieting, Richard, MD  insulin lispro (HUMALOG) 100 UNIT/ML KwikPen Inject 6 Units into the skin 3 (three) times daily with meals. 05/07/20  Yes Wieting, Richard, MD  losartan (COZAAR) 50 MG tablet Take 1 tablet (50 mg total) by mouth daily. 05/07/20 05/02/21 Yes Wieting, Richard, MD  mirtazapine (REMERON) 30 MG tablet Take 1 tablet (30  mg total) by mouth at bedtime. 05/07/20  Yes Wieting, Richard, MD  omeprazole (PRILOSEC) 40 MG capsule Take 1 capsule (40 mg total) by mouth daily. 05/07/20 08/05/20 Yes Wieting, Richard, MD  pregabalin (LYRICA) 50 MG capsule Take 50 mg by mouth 2 (two) times daily. 11/09/19  Yes [provider]  REXULTI 1 MG TABS tablet Take 1 mg by mouth every morning. 03/11/20  Yes  [provider]  Insulin Pen Needle 32G X 4 MM MISC 1 Dose by Does not apply route 4 (four) times daily -  before meals and at bedtime. 05/07/20   Loletha Grayer, MD  nicotine polacrilex (NICORETTE) 4 MG gum Take by mouth. Patient not taking: No sig reported 01/24/20   [provider]  ondansetron (ZOFRAN ODT) 4 MG disintegrating tablet Take 1 tablet (4 mg total) by mouth every 6 (six) hours as needed for nausea or vomiting. Patient not taking: No sig reported 04/25/20   Delman Kitten, MD  traMADol (ULTRAM) 50 MG tablet Take 1 tablet (50 mg total) by mouth every 6 (six) hours as needed. Patient not taking: No sig reported 03/17/20   Harvest Dark, MD    Allergies Patient has no known allergies.  Family History  Problem Relation Age of Onset  . Heart disease Mother   . Prostate cancer Father     Social History Social History   Tobacco Use  . Smoking status: Current Every Day Smoker  . Smokeless tobacco: Never Used  Vaping Use  . Vaping Use: Never used  Substance Use Topics  . Alcohol use: Not Currently  . Drug use: Yes    Frequency: 3.0 times per week    Types: Marijuana    Review of Systems  Constitutional: No fever/chills Eyes: No visual changes. ENT: No sore throat. Cardiovascular: Denies chest pain. Respiratory: Denies shortness of breath. Gastrointestinal: Positive for abdominal pain, nausea, and vomiting.  No diarrhea.  No constipation. Genitourinary: Negative for dysuria. Musculoskeletal: Negative for back pain. Skin: Negative for rash. Neurological: Negative for headaches, focal weakness or numbness.  ____________________________________________   PHYSICAL EXAM:  VITAL SIGNS: ED Triage Vitals  Enc Vitals Group     BP      Pulse      Resp      Temp      Temp src      SpO2      Weight      Height      Head Circumference      Peak Flow      Pain Score      Pain Loc      Pain Edu?      Excl. in [place]?     Constitutional: Alert  and oriented. Eyes: Conjunctivae are normal. Head: Atraumatic. Nose: No congestion/rhinnorhea. Mouth/Throat: Mucous membranes are moist. Neck: Normal ROM Cardiovascular: Normal rate, regular rhythm. Grossly normal heart sounds. Respiratory: Normal respiratory effort.  No retractions. Lungs CTAB. Gastrointestinal: Soft and tender to palpation in the epigastrium with no rebound or guarding. No distention. Genitourinary: deferred Musculoskeletal: No lower extremity tenderness nor edema. Neurologic:  Normal speech and language. No gross focal neurologic deficits are appreciated. Skin:  Skin is warm, dry and intact. No rash noted. Psychiatric: Mood and affect are normal. Speech and behavior are normal.  ____________________________________________   LABS (all labs ordered are listed, but only abnormal results are displayed)  Labs Reviewed  CBC - Abnormal; Notable for the following components:      Result Value  RBC 3.52 (*)    Hemoglobin 11.6 (*)    HCT 34.6 (*)    All other components within normal limits  URINALYSIS, COMPLETE (UACMP) WITH MICROSCOPIC - Abnormal; Notable for the following components:   Color, Urine COLORLESS (*)    APPearance CLEAR (*)    Glucose, UA >=500 (*)    Ketones, ur 5 (*)    Protein, ur 100 (*)    All other components within normal limits  COMPREHENSIVE METABOLIC PANEL - Abnormal; Notable for the following components:   Sodium 131 (*)    CO2 21 (*)    Glucose, Bld 692 (*)    BUN 31 (*)    Creatinine, Ser 1.19 (*)    Total Protein 6.3 (*)    Albumin 3.0 (*)    Alkaline Phosphatase 136 (*)    GFR, Estimated 51 (*)    All other components within normal limits  CBG MONITORING, ED - Abnormal; Notable for the following components:   Glucose-Capillary >600 (*)    All other components within normal limits  CBG MONITORING, ED - Abnormal; Notable for the following components:   Glucose-Capillary >600 (*)    All other components within normal limits  CBG  MONITORING, ED - Abnormal; Notable for the following components:   Glucose-Capillary 561 (*)    All other components within normal limits  CBG MONITORING, ED - Abnormal; Notable for the following components:   Glucose-Capillary 434 (*)    All other components within normal limits  SARS CORONAVIRUS 2 (TAT 6-24 HRS)  BLOOD GAS, VENOUS  LIPASE, BLOOD  CBC  CREATININE, SERUM  BETA-HYDROXYBUTYRIC ACID  URINE DRUG SCREEN, QUALITATIVE ([facility] ONLY)    PROCEDURES  Procedure(s) performed (including Critical Care):  Procedures   ____________________________________________   INITIAL IMPRESSION / ASSESSMENT AND PLAN / ED COURSE       65 year old female with past medical history of hypertension, hyperlipidemia, diabetes, CAD, CKD, COPD, and bipolar disorder who presents to the ED with 2 days of gradually worsening abdominal pain along with hyperglycemia.  Patient does appear dehydrated with tenderness in her epigastrium.  CT scan is negative for acute intra-abdominal process, labs show hyperglycemia with no evidence of DKA.  Patient continues to have significant pain despite IV morphine and Zofran.  She would benefit from admission for further treatment of intractable nausea and vomiting along with hyperglycemia.  We will start patient on an insulin drip, case discussed with hospitalist for admission.      ____________________________________________   FINAL CLINICAL IMPRESSION(S) / ED DIAGNOSES  Final diagnoses:  Hyperglycemia  Epigastric pain     ED Discharge Orders    None       Note:  This document was prepared using Dragon voice recognition software and may include unintentional dictation errors.   Blake Divine, MD 05/21/20 [phone]

## 2020-05-21 NOTE — ED Notes (Signed)
Purewick placed on pt by this tech. Pt comfortable with no other needs at this time.

## 2020-05-22 DIAGNOSIS — R739 Hyperglycemia, unspecified: Secondary | ICD-10-CM | POA: Diagnosis not present

## 2020-05-22 DIAGNOSIS — E1165 Type 2 diabetes mellitus with hyperglycemia: Secondary | ICD-10-CM | POA: Diagnosis not present

## 2020-05-22 LAB — CBC
HCT: 30.3 % — ABNORMAL LOW (ref 36.0–46.0)
Hemoglobin: 10 g/dL — ABNORMAL LOW (ref 12.0–15.0)
MCH: 32.5 pg (ref 26.0–34.0)
MCHC: 33 g/dL (ref 30.0–36.0)
MCV: 98.4 fL (ref 80.0–100.0)
Platelets: 313 10*3/uL (ref 150–400)
RBC: 3.08 MIL/uL — ABNORMAL LOW (ref 3.87–5.11)
RDW: 13.3 % (ref 11.5–15.5)
WBC: 5.7 10*3/uL (ref 4.0–10.5)
nRBC: 0 % (ref 0.0–0.2)

## 2020-05-22 LAB — GLUCOSE, CAPILLARY
Glucose-Capillary: 141 mg/dL — ABNORMAL HIGH (ref 70–99)
Glucose-Capillary: 257 mg/dL — ABNORMAL HIGH (ref 70–99)

## 2020-05-22 LAB — COMPREHENSIVE METABOLIC PANEL
ALT: 13 U/L (ref 0–44)
AST: 15 U/L (ref 15–41)
Albumin: 2.5 g/dL — ABNORMAL LOW (ref 3.5–5.0)
Alkaline Phosphatase: 107 U/L (ref 38–126)
Anion gap: 8 (ref 5–15)
BUN: 29 mg/dL — ABNORMAL HIGH (ref 8–23)
CO2: 28 mmol/L (ref 22–32)
Calcium: 9 mg/dL (ref 8.9–10.3)
Chloride: 106 mmol/L (ref 98–111)
Creatinine, Ser: 1.15 mg/dL — ABNORMAL HIGH (ref 0.44–1.00)
GFR, Estimated: 53 mL/min — ABNORMAL LOW (ref >60)
Glucose, Bld: 98 mg/dL (ref 70–99)
Potassium: 3.5 mmol/L (ref 3.5–5.1)
Sodium: 142 mmol/L (ref 135–145)
Total Bilirubin: 0.6 mg/dL (ref 0.3–1.2)
Total Protein: 5.3 g/dL — ABNORMAL LOW (ref 6.5–8.1)

## 2020-05-22 LAB — SARS CORONAVIRUS 2 (TAT 6-24 HRS): SARS Coronavirus 2: NEGATIVE

## 2020-05-22 MED ORDER — POLYETHYLENE GLYCOL 3350 17 G PO PACK: 34.0000 g | PACK | Freq: Every day | ORAL | 0 refills | Status: DC

## 2020-05-22 MED ORDER — ONDANSETRON 4 MG PO TBDP: 4.0000 mg | ORAL_TABLET | Freq: Four times a day (QID) | ORAL | 1 refills | Status: DC | PRN

## 2020-05-22 MED ORDER — INSULIN ASPART 100 UNIT/ML IJ SOLN: 3.0000 [IU] | Freq: Three times a day (TID) | INTRAMUSCULAR | Status: DC

## 2020-05-22 MED ORDER — POLYETHYLENE GLYCOL 3350 17 G PO PACK
34.0000 g | PACK | ORAL | Status: DC
  Administered 2020-05-22: 34 g via ORAL
  Filled 2020-05-22 (×5): qty 2

## 2020-05-22 NOTE — Plan of Care (Signed)
Patient does not want to leave I am trying to understand why? Not very forethcoming, states she likes the beds and wants to rest

## 2020-05-22 NOTE — Progress Notes (Signed)
Met with the patient to discuss DC plan and needs  She lives at home with her adult daughter She stated her PCP is helping her get into adult affordable housing She requested assistance applying for Medicaid that pays for that, I called Amy with [employer] and left a VM requesting her to contact the patient to assist The patient has 3 in 1 and RW at home and does not need additional DME She has a moral support worker  Colletta [place] with [employer], She goes to mental health support program,  She stated she has transportation  She can afford her medications

## 2020-05-22 NOTE — Progress Notes (Addendum)
Inpatient Diabetes Program Recommendations  AACE/ADA: New Consensus Statement on Inpatient Glycemic Control   Target Ranges:  Prepandial:   less than 140 mg/dL      Peak postprandial:   less than 180 mg/dL (1-2 hours)      Critically ill patients:  140 - 180 mg/dL   Results for Erica Mercer, Erica Mercer (MRN [id number]) as of 05/22/2020 07:22  Ref. Range 05/21/2020 11:01 05/21/2020 13:43 05/21/2020 14:34 05/21/2020 15:26 05/21/2020 17:07 05/21/2020 19:26 05/21/2020 20:50  Glucose-Capillary Latest Ref Range: 70 - 99 mg/dL >600 (HH) >600 (HH) 561 (HH) 434 (H) 188 (H) 133 (H) 274 (H)  Results for Erica Mercer, Erica Mercer (MRN [id number]) as of 05/22/2020 07:22  Ref. Range 02/25/2020 12:45 05/05/2020 12:00  Hemoglobin A1C Latest Ref Range: 4.8 - 5.6 % 14.4 (H) 13.8 (H)   Review of Glycemic Control  Diabetes history: DM1 Outpatient Diabetes medications: Lantus 25 units daily, Humalog 6 units TID with meals Current orders for Inpatient glycemic control: Lantus 10 units daily, Novolog 0-9 units TID with meals, Novolog 0-5 units QHS  Inpatient Diabetes Program Recommendations:    Insulin:  Please consider ordering Novolog 3 units TID with meals for meal coverage if patient eats at least 50% of meals.  Outpatient: Would recommend that patient's family assist her with DM management to ensure she is taking insulin consistently.  NOTE: Noted consult for diabetes coordinator. Patient is well known to inpatient diabetes team due to multiple ED visits and hospital admission. Patient was recently admitted 05/05/20-05/07/20 and was last seen by our team on 05/06/20. Per note on 05/06/20 by J. Rosebud Poles, RN Diabetes Coordinator, patient reported that "she sometimes forgets to take insulin and sometimes just doesn't take the insulin b/c she doesn't want to. States she needs help remembering to take her meds and needs someone to encourage her to take her meds."   Addendum 05/22/20@12 :00-Spoke with patient at bedside regarding DM management.  Patient states she is prescribed Lantus and Humalog as noted above.  Patient reports that she lives with her adult daughter and that her daughter is never home. Patient states she is in the process of trying to get into an assisted living home so that she has more help with DM management. Patient reports that her memory is bad and she does not remember to taking insulin as prescribed. Discussed importance of taking insulin consistently and finding ways to help her remember to take insulin injections. Discussed putting reminders in her cell phone so that it would alert her but she states she does not know how to do that. Asked patient to ask her daughter to help her set the reminders in her phone. Discussed DKA and the impact of repeated DKA on her body and her health. Stressed to patient that she needs to find a way to remember or get help so that she is taking insulin consistently as prescribed. Patient states she is hopeful that she can get into an assisted living home and that she will do her best to try to remember to take her insulin. Patient verbalized understanding of information discussed and has no questions at this time.  Thanks, Barnie Alderman, RN, MSN, CDE Diabetes Coordinator Inpatient Diabetes Program [phone] (Team Pager from 8am to 5pm)

## 2020-05-22 NOTE — Discharge Summary (Signed)
Physician Discharge Summary   Erica Mercer  female DOB: 08-08-1955  UUV:[id number]  PCP: [name], NP  Admit date: 05/21/2020 Discharge date: 05/22/2020  Admitted From: home Disposition:  home CODE STATUS: Full code  Discharge Instructions    Discharge instructions   Complete by: As directed    Uncontrolled blood sugar level can cause nausea vomiting and abdominal pain.  Please make sure you give yourself insulin, and to make it easier for you, just take Lantus 25 units daily.  Mariajuana use can also cause nausea vomiting.  Try reduce Mariajuana use.  Constipation can cause nausea vomiting abdominal pain.  Please take over-the-counter Miralax to ensure regular bowel movement. [facility] Hospital Course:  For full details, please see H&P, progress notes, consult notes and ancillary notes.  Briefly,  Erica Mercer is a 65 y.o. female with medical history significant for uncontrolled insulin-dependent type 2 diabetes, frequent hospitalizations, discharged 2 weeks ago for DKA, tobacco use disorder, marijuana use disorder, essential hypertension, hyperlipidemia, GERD, polyneuropathy, who presented to [facility] ED with complaints of worsening severe epigastric pain associated with nausea and vomiting x1 day.  Hyperglycemia in the setting of type 2 diabetes Suspect insulin noncompliance Presented with polyuria, polydipsia, serum glucose 692, bicarb 21, anion gap of 11.   UA positive for glycosuria and proteinuria Hemoglobin A1c 13.8 on 05/05/2020. Started on Endo tool in the ED along with IV fluid hydration, and then transitioned to subQ insulin. Pt had no N/V, and tolerated diet.   Home med listed Lantus 22 units daily, Humalog 6 units 3 times daily, however, suspect non-compliance.  For ease of administration and better compliance, pt was discharged on just Lantus 25u daily, without meal-time insulin.  Epigastric pain, unclear etiology possibly gastritis Admits to  mild use of alcohol. Reports history of acid reflux Lipase level was normal CT abdomen and pelvis with contrast was nonrevealing. Hemoglobin is stable and up rising 11.6 from 9.7 during her discharge from previous admission on 05/06/2020. Pt was started on PPI.  Nausea and vomiting, resolved Possible etiologies included hyperglycemia, Mariajuana use, and constipation.  No N/V noted and pt tolerated diet prior to discharge.  Essential hypertension Continued on home amlodipine and Losartan.  Tobacco use disorder States she is cut down tobacco use, now uses 10 to 11 cigarettes/day. Nicotine patch.  Marijuana use disorder States she uses marijuana every night before bed helps with her insomnia and with her appetite.  Polyneuropathy Continued Lyrica.  Reported Ambulatory dysfunction, not currently active Pt was seen walking out of the hospital without assist device.    Bipolar disorder Continued home regimen.   Discharge Diagnoses:  Active Problems:   Hyperglycemia   30 Day Unplanned Readmission Risk Score   Flowsheet Row ED to Hosp-Admission (Discharged) from 05/05/2020 in [place] (1A)  30 Day Unplanned Readmission Risk Score (%) 32.08 Filed at 05/07/2020 0801     This score is the patient's risk of an unplanned readmission within 30 days of being discharged (0 -100%). The score is based on dignosis, age, lab data, medications, orders, and past utilization.   Low:  0-14.9   Medium: 15-21.9   High: 22-29.9   Extreme: 30 and above        Discharge Instructions:  Allergies as of 05/22/2020   No Known Allergies     Medication List    STOP taking these medications   insulin lispro 100 UNIT/ML KwikPen Commonly known as:  HUMALOG   traMADol 50 MG tablet Commonly known as: Ultram     TAKE these medications   albuterol 108 (90 Base) MCG/ACT inhaler Commonly known as: VENTOLIN HFA Inhale 2 puffs into the lungs every 6 (six)  hours as needed for wheezing.   amLODipine 10 MG tablet Commonly known as: NORVASC Take 1 tablet (10 mg total) by mouth daily.   atorvastatin 40 MG tablet Commonly known as: LIPITOR Take 1 tablet (40 mg total) by mouth at bedtime.   docusate sodium 100 MG capsule Commonly known as: Colace Take 1 capsule (100 mg total) by mouth 2 (two) times daily.   Insulin Pen Needle 32G X 4 MM Misc 1 Dose by Does not apply route 4 (four) times daily -  before meals and at bedtime.   Lantus SoloStar 100 UNIT/ML Solostar Pen Generic drug: insulin glargine Inject 25 Units into the skin daily.   losartan 50 MG tablet Commonly known as: COZAAR Take 1 tablet (50 mg total) by mouth daily.   mirtazapine 30 MG tablet Commonly known as: REMERON Take 1 tablet (30 mg total) by mouth at bedtime.   nicotine polacrilex 4 MG gum Commonly known as: NICORETTE Take by mouth.   omeprazole 40 MG capsule Commonly known as: PRILOSEC Take 1 capsule (40 mg total) by mouth daily.   ondansetron 4 MG disintegrating tablet Commonly known as: Zofran ODT Take 1 tablet (4 mg total) by mouth every 6 (six) hours as needed for nausea or vomiting.   polyethylene glycol 17 g packet Commonly known as: MIRALAX / GLYCOLAX Take 34 g by mouth daily.   pregabalin 50 MG capsule Commonly known as: LYRICA Take 50 mg by mouth 2 (two) times daily.   Rexulti 1 MG Tabs tablet Generic drug: brexpiprazole Take 1 mg by mouth every morning.        Follow-up Information    [name], NP. Schedule an appointment as soon as possible for a visit in 1 week(s).   Specialty: Family Medicine Contact information: [place] [address] [phone]               No Known Allergies   The results of significant diagnostics from this hospitalization (including imaging, microbiology, ancillary and laboratory) are listed below for reference.    Consultations:   Procedures/Studies: CT ABDOMEN PELVIS WO CONTRAST  Result Date: 05/05/2020 CLINICAL DATA:  Left flank pain, hyperglycemia, nausea and vomiting. EXAM: CT ABDOMEN AND PELVIS WITHOUT CONTRAST TECHNIQUE: Multidetector CT imaging of the abdomen and pelvis was performed following the standard protocol without IV contrast. COMPARISON:  04/25/2020. FINDINGS: Lower chest: Lung bases are clear. Heart is at the upper limits of normal in size. Atherosclerotic calcification of the aorta and coronary arteries. No pericardial or pleural effusion. There may be distal esophageal wall thickening which can be seen with gastroesophageal reflux. Hepatobiliary: Liver and gallbladder are unremarkable. No biliary ductal dilatation. Pancreas: Negative. Spleen: Negative. Adrenals/Urinary Tract: Thickening of both adrenal glands, left greater than right. Tiny right renal stones. Kidneys are otherwise unremarkable. Ureters are decompressed. Bladder is grossly unremarkable. Stomach/Bowel: Stomach and small bowel are unremarkable. Appendix is not readily visualized. Stool is seen in the majority of the colon. Vascular/Lymphatic: Atherosclerotic calcification of the aorta. No pathologically enlarged lymph nodes. Reproductive: Calcified lesions in the uterus are likely fibroids. No adnexal mass. Other: No free fluid.  Mesenteries and peritoneum are unremarkable. Musculoskeletal: Degenerative changes in the spine. Mild changes of avascular necrosis  in the right femoral head. No worrisome lytic or sclerotic lesions. IMPRESSION: 1. Right renal stones.  No obstruction. 2. Stool in the majority of the colon is indicative of constipation. 3. 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 4. Aortic atherosclerosis (ICD10-I70.0). Coronary artery calcification. Electronically Signed   By: Lorin Picket M.D.   On: 05/05/2020 13:10   CT ABDOMEN PELVIS WO CONTRAST  Result Date: 04/25/2020 CLINICAL DATA:   65 year old with nausea and vomiting. EXAM: CT ABDOMEN AND PELVIS WITHOUT CONTRAST TECHNIQUE: Multidetector CT imaging of the abdomen and pelvis was performed following the standard protocol without IV contrast. COMPARISON:  03/17/2020 FINDINGS: Lower chest: Lung bases are clear. Hepatobiliary: Normal appearance of the liver and gallbladder. Pancreas: Unremarkable. No pancreatic ductal dilatation or surrounding inflammatory changes. Spleen: Normal in size without focal abnormality. Adrenals/Urinary Tract: Normal appearance of the adrenal glands. Two calcifications in the right kidney, largest measures 4 mm. These could represent small kidney stones versus vascular calcifications. Negative for hydronephrosis. Fluid in the urinary bladder. Normal appearance of left kidney without hydronephrosis or stones. Small calcification in the left renal sinus is suggestive for a vascular calcification. Stomach/Bowel: Stomach is within normal limits. Appendix is not confidently identified. No evidence of bowel wall thickening, distention, or inflammatory changes. Vascular/Lymphatic: Aorta and iliac arteries are heavily calcified. Negative for an aortic aneurysm. No significant lymph node enlargement in the abdomen or pelvis. Reproductive: Again noted is a retroverted uterus along the right side of the pelvis. Heterogeneity involving the uterus compatible with fibroid disease. No evidence for an adnexal lesion but limited evaluation. Other: Negative for ascites. Negative for free air. Again noted is a small umbilical hernia containing fat. Musculoskeletal: Again noted is a small focus of sclerosis along the superior right femoral head compatible with a small area of AVN. No evidence for femoral head collapse. Degenerative facet disease in the lumbar spine. IMPRESSION: 1. No acute abnormality in the abdomen or pelvis. 2. Two small calcifications in the right kidney. These could represent nonobstructive stones. 3. Retroverted uterus  with evidence of fibroid disease. 4. 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 5.  Aortic Atherosclerosis (ICD10-I70.0). Electronically Signed   By: Markus Daft M.D.   On: 04/25/2020 17:05   CT Head Wo Contrast  Result Date: 05/05/2020 CLINICAL DATA:  Mental status change EXAM: CT HEAD WITHOUT CONTRAST TECHNIQUE: Contiguous axial images were obtained from the base of the skull through the vertex without intravenous contrast. COMPARISON:  None. FINDINGS: Brain: No acute intracranial hemorrhage. No focal mass lesion. No CT evidence of acute infarction. No midline shift or mass effect. No hydrocephalus. Basilar cisterns are patent. Mild periventricular and subcortical white matter hypodensities. Vascular: No hyperdense vessel or unexpected calcification. Skull: Normal. Negative for fracture or focal lesion. Sinuses/Orbits: Paranasal sinuses and mastoid air cells are clear. Orbits are clear. Other: None. IMPRESSION: 1. No acute intracranial findings. 2. Mild periventricular and subcortical white matter hypodense most consistent small vessel ischemia. Electronically Signed   By: Suzy Bouchard M.D.   On: 05/05/2020 13:05   MR BRAIN WO CONTRAST  Result Date: 05/05/2020 CLINICAL DATA:  Initial evaluation for neuro deficit, stroke suspected. EXAM: MRI HEAD WITHOUT CONTRAST TECHNIQUE: Multiplanar, multiecho pulse sequences of the brain and surrounding structures were obtained without intravenous contrast. COMPARISON:  Prior head CT from earlier same day. FINDINGS: Brain: Examination degraded by motion. Cerebral volume within normal limits for age. Patchy T2/FLAIR hyperintensity within the periventricular deep  white matter both cerebral hemispheres as well as the pons, most consistent with chronic small vessel ischemic disease, mild to moderate in nature. No abnormal foci of restricted diffusion to suggest acute or subacute ischemia. Gray-white matter differentiation maintained. No  encephalomalacia to suggest chronic cortical infarction. No evidence for acute or chronic intracranial hemorrhage. No mass lesion, midline shift or mass effect. No hydrocephalus or extra-axial fluid collection. Pituitary gland suprasellar region normal. Midline structures intact. Vascular: Major intracranial vascular flow voids are maintained. Skull and upper cervical spine: Craniocervical junction normal. Bone marrow signal intensity within normal limits. No scalp soft tissue abnormality. Sinuses/Orbits: Globes and orbital soft tissues within normal limits. Moderate mucosal thickening noted within the right maxillary sinus. Additional mild mucosal thickening noted elsewhere within the paranasal sinuses. No mastoid effusion. Inner ear structures grossly normal. Other: None. IMPRESSION: 1. No acute intracranial abnormality. 2. Mild to moderate chronic microvascular ischemic disease. Electronically Signed   By: Jeannine Boga M.D.   On: 05/05/2020 23:38   CT Abdomen Pelvis W Contrast  Result Date: 05/21/2020 CLINICAL DATA:  Progressive abdominal pain.  Hypertension. EXAM: CT ABDOMEN AND PELVIS WITH CONTRAST TECHNIQUE: Multidetector CT imaging of the abdomen and pelvis was performed using the standard protocol following bolus administration of intravenous contrast. CONTRAST:  70mL OMNIPAQUE IOHEXOL 300 MG/ML  SOLN COMPARISON:  05/05/2020 FINDINGS: Lower chest: Clear lung bases. Mild cardiomegaly. Right coronary artery calcification. Hepatobiliary: Normal liver. Normal gallbladder, without biliary ductal dilatation. Pancreas: Normal, without mass or ductal dilatation. Spleen: Normal in size, without focal abnormality. Adrenals/Urinary Tract: Normal adrenal glands. Right renal calculi of up to 2-3 mm. No hydronephrosis. Left extrarenal pelvis. Normal urinary bladder. Stomach/Bowel: Normal stomach, without wall thickening. Colonic stool burden suggests constipation. Normal terminal ileum. Normal small bowel.  Vascular/Lymphatic: Aortic atherosclerosis. No abdominopelvic adenopathy. Reproductive: Retroverted uterus. A fundal fibroid of 3.0 cm on 62/2. No adnexal mass. Other: No significant free fluid. Musculoskeletal: Right femoral head avascular necrosis. IMPRESSION: 1.  No acute process in the abdomen or pelvis. 2. Right nephrolithiasis. 3.  Possible constipation. 4.  Aortic Atherosclerosis (ICD10-I70.0). 5. Age advanced coronary artery atherosclerosis. Recommend assessment of coronary risk factors and consideration of medical therapy. 6. Uterine fibroids 7. Right femoral head avascular necrosis. Electronically Signed   By: Abigail Miyamoto M.D.   On: 05/21/2020 13:29   DG Chest Portable 1 View  Result Date: 04/30/2020 CLINICAL DATA:  Chest pain Nausea Vomiting EXAM: PORTABLE CHEST 1 VIEW COMPARISON:  None. FINDINGS: Unchanged mild cardiomegaly. No significant pulmonary vascular congestion. Lungs are clear. Redemonstration of rounded calcification in the right axilla. IMPRESSION: 1. Unchanged cardiomegaly. 2. Redemonstration of rounded calcification in the right axilla. Correlation with physical examination again recommended as this could represent a breast or other soft tissue tumor. Electronically Signed   By: Miachel Roux M.D.   On: 04/30/2020 09:55      Labs: BNP (last 3 results) No results for input(s): BNP in the last 8760 hours. Basic Metabolic Panel: Recent Labs  Lab 05/21/20 1115 05/21/20 1715 05/22/20 0511  NA 131* 140 142  K 4.7 3.5 3.5  CL 99 104 106  CO2 21* 25 28  GLUCOSE 692* 158* 98  BUN 31* 31* 29*  CREATININE 1.19* 1.18* 1.15*  CALCIUM 9.0 9.8 9.0   Liver Function Tests: Recent Labs  Lab 05/21/20 1115 05/22/20 0511  AST 21 15  ALT 16 13  ALKPHOS 136* 107  BILITOT 0.8 0.6  PROT 6.3* 5.3*  ALBUMIN 3.0* 2.5*  Recent Labs  Lab 05/21/20 1115  LIPASE 30   No results for input(s): AMMONIA in the last 168 hours. CBC: Recent Labs  Lab 05/21/20 1115 05/21/20 1715  05/22/20 0511  WBC 6.2 6.9 5.7  HGB 11.6* 11.0* 10.0*  HCT 34.6* 32.9* 30.3*  MCV 98.3 97.3 98.4  PLT 340 366 313   Cardiac Enzymes: No results for input(s): CKTOTAL, CKMB, CKMBINDEX, TROPONINI in the last 168 hours. BNP: Invalid input(s): POCBNP CBG: Recent Labs  Lab 05/21/20 1526 05/21/20 1707 05/21/20 1926 05/21/20 2050 05/22/20 0757  GLUCAP 434* 188* 133* 274* 141*   D-Dimer No results for input(s): DDIMER in the last 72 hours. Hgb A1c No results for input(s): HGBA1C in the last 72 hours. Lipid Profile No results for input(s): CHOL, HDL, LDLCALC, TRIG, CHOLHDL, LDLDIRECT in the last 72 hours. Thyroid function studies No results for input(s): TSH, T4TOTAL, T3FREE, THYROIDAB in the last 72 hours.  Invalid input(s): FREET3 Anemia work up No results for input(s): VITAMINB12, FOLATE, FERRITIN, TIBC, IRON, RETICCTPCT in the last 72 hours. Urinalysis    Component Value Date/Time   COLORURINE COLORLESS (A) 05/21/2020 1248   APPEARANCEUR CLEAR (A) 05/21/2020 1248   LABSPEC 1.013 05/21/2020 1248   PHURINE 7.0 05/21/2020 1248   GLUCOSEU >=500 (A) 05/21/2020 1248   HGBUR NEGATIVE 05/21/2020 1248   BILIRUBINUR NEGATIVE 05/21/2020 1248   KETONESUR 5 (A) 05/21/2020 1248   PROTEINUR 100 (A) 05/21/2020 1248   NITRITE NEGATIVE 05/21/2020 1248   LEUKOCYTESUR NEGATIVE 05/21/2020 1248   Sepsis Labs Invalid input(s): PROCALCITONIN,  WBC,  LACTICIDVEN Microbiology Recent Results (from the past 240 hour(s))  SARS CORONAVIRUS 2 (TAT 6-24 HRS) Nasopharyngeal Nasopharyngeal Swab     Status: None   Collection Time: 05/21/20  1:50 PM   Specimen: Nasopharyngeal Swab  Result Value Ref Range Status   SARS Coronavirus 2 NEGATIVE NEGATIVE Final    Comment: (NOTE) SARS-CoV-2 target nucleic acids are NOT DETECTED.  The SARS-CoV-2 RNA is generally detectable in upper and lower respiratory specimens during the acute phase of infection. Negative results do not preclude SARS-CoV-2  infection, do not rule out co-infections with other pathogens, and should not be used as the sole basis for treatment or other patient management decisions. Negative results must be combined with clinical observations, patient history, and epidemiological information. The expected result is Negative.  Fact Sheet for Patients: SugarRoll.be  Fact Sheet for Healthcare Providers: https://www.woods-mathews.com/  This test is not yet approved or cleared by the [place] FDA and  has been authorized for detection and/or diagnosis of SARS-CoV-2 by FDA under an Emergency Use Authorization (EUA). This EUA will remain  in effect (meaning this test can be used) for the duration of the COVID-19 declaration under Se ction 564(b)(1) of the Act, 21 U.S.C. section 360bbb-3(b)(1), unless the authorization is terminated or revoked sooner.  Performed at [facility], [place] [address] [place], [place] [address]      Total time spend on discharging this patient, including the last patient exam, discussing the hospital stay, instructions for ongoing care as it relates to all pertinent caregivers, as well as preparing the medical discharge records, prescriptions, and/or referrals as applicable, is 45 minutes.    Enzo Bi, MD  [facility] 05/22/2020, 10:41 AM

## 2020-05-25 ENCOUNTER — Emergency Department
Admission: EM | Admit: 2020-05-25 | Discharge: 2020-05-25 | Disposition: A | Discharge status: Home / Self Care | Payer: 59 | Attending: Emergency Medicine | Admitting: Emergency Medicine

## 2020-05-25 DIAGNOSIS — R739 Hyperglycemia, unspecified: Secondary | ICD-10-CM

## 2020-05-25 DIAGNOSIS — I129 Hypertensive chronic kidney disease with stage 1 through stage 4 chronic kidney disease, or unspecified chronic kidney disease: Secondary | ICD-10-CM | POA: Diagnosis not present

## 2020-05-25 DIAGNOSIS — Z79899 Other long term (current) drug therapy: Secondary | ICD-10-CM | POA: Insufficient documentation

## 2020-05-25 DIAGNOSIS — J449 Chronic obstructive pulmonary disease, unspecified: Secondary | ICD-10-CM | POA: Diagnosis not present

## 2020-05-25 DIAGNOSIS — Z794 Long term (current) use of insulin: Secondary | ICD-10-CM | POA: Insufficient documentation

## 2020-05-25 DIAGNOSIS — I2510 Atherosclerotic heart disease of native coronary artery without angina pectoris: Secondary | ICD-10-CM | POA: Insufficient documentation

## 2020-05-25 DIAGNOSIS — E1022 Type 1 diabetes mellitus with diabetic chronic kidney disease: Secondary | ICD-10-CM | POA: Insufficient documentation

## 2020-05-25 DIAGNOSIS — N1831 Chronic kidney disease, stage 3a: Secondary | ICD-10-CM | POA: Diagnosis not present

## 2020-05-25 DIAGNOSIS — F17200 Nicotine dependence, unspecified, uncomplicated: Secondary | ICD-10-CM | POA: Diagnosis not present

## 2020-05-25 DIAGNOSIS — R1013 Epigastric pain: Secondary | ICD-10-CM | POA: Diagnosis not present

## 2020-05-25 DIAGNOSIS — E1065 Type 1 diabetes mellitus with hyperglycemia: Secondary | ICD-10-CM | POA: Insufficient documentation

## 2020-05-25 DIAGNOSIS — R112 Nausea with vomiting, unspecified: Secondary | ICD-10-CM | POA: Diagnosis not present

## 2020-05-25 LAB — CBC
HCT: 31.7 % — ABNORMAL LOW (ref 36.0–46.0)
Hemoglobin: 10.4 g/dL — ABNORMAL LOW (ref 12.0–15.0)
MCH: 32.7 pg (ref 26.0–34.0)
MCHC: 32.8 g/dL (ref 30.0–36.0)
MCV: 99.7 fL (ref 80.0–100.0)
Platelets: 307 10*3/uL (ref 150–400)
RBC: 3.18 MIL/uL — ABNORMAL LOW (ref 3.87–5.11)
RDW: 13.3 % (ref 11.5–15.5)
WBC: 6.7 10*3/uL (ref 4.0–10.5)
nRBC: 0 % (ref 0.0–0.2)

## 2020-05-25 LAB — COMPREHENSIVE METABOLIC PANEL
ALT: 14 U/L (ref 0–44)
AST: 19 U/L (ref 15–41)
Albumin: 3.1 g/dL — ABNORMAL LOW (ref 3.5–5.0)
Alkaline Phosphatase: 120 U/L (ref 38–126)
Anion gap: 11 (ref 5–15)
BUN: 30 mg/dL — ABNORMAL HIGH (ref 8–23)
CO2: 25 mmol/L (ref 22–32)
Calcium: 9.3 mg/dL (ref 8.9–10.3)
Chloride: 96 mmol/L — ABNORMAL LOW (ref 98–111)
Creatinine, Ser: 1.29 mg/dL — ABNORMAL HIGH (ref 0.44–1.00)
GFR, Estimated: 46 mL/min — ABNORMAL LOW (ref >60)
Glucose, Bld: 661 mg/dL (ref 70–99)
Potassium: 3.9 mmol/L (ref 3.5–5.1)
Sodium: 132 mmol/L — ABNORMAL LOW (ref 135–145)
Total Bilirubin: 0.6 mg/dL (ref 0.3–1.2)
Total Protein: 6.6 g/dL (ref 6.5–8.1)

## 2020-05-25 LAB — BLOOD GAS, VENOUS
Acid-Base Excess: 6.8 mmol/L — ABNORMAL HIGH (ref 0.0–2.0)
Bicarbonate: 32.3 mmol/L — ABNORMAL HIGH (ref 20.0–28.0)
O2 Saturation: 66.2 %
Patient temperature: 37
pCO2, Ven: 51 mmHg (ref 44.0–60.0)
pH, Ven: 7.41 (ref 7.250–7.430)
pO2, Ven: 34 mmHg (ref 32.0–45.0)

## 2020-05-25 LAB — CBG MONITORING, ED: Glucose-Capillary: 411 mg/dL — ABNORMAL HIGH (ref 70–99)

## 2020-05-25 LAB — BETA-HYDROXYBUTYRIC ACID: Beta-Hydroxybutyric Acid: 0.24 mmol/L (ref 0.05–0.27)

## 2020-05-25 LAB — LIPASE, BLOOD: Lipase: 29 U/L (ref 11–51)

## 2020-05-25 MED ORDER — INSULIN ASPART 100 UNIT/ML IJ SOLN
10.0000 [IU] | Freq: Once | INTRAMUSCULAR | Status: AC
  Administered 2020-05-25: 10 [IU] via SUBCUTANEOUS
  Filled 2020-05-25: qty 1

## 2020-05-25 MED ORDER — MORPHINE SULFATE (PF) 2 MG/ML IV SOLN
2.0000 mg | Freq: Once | INTRAVENOUS | Status: AC
  Administered 2020-05-25: 2 mg via INTRAVENOUS
  Filled 2020-05-25: qty 1

## 2020-05-25 MED ORDER — SODIUM CHLORIDE 0.9 % IV SOLN
1000.0000 mL | Freq: Once | INTRAVENOUS | Status: AC
  Administered 2020-05-25: 1000 mL via INTRAVENOUS

## 2020-05-25 MED ORDER — INSULIN ASPART 100 UNIT/ML IJ SOLN
10.0000 [IU] | Freq: Once | INTRAMUSCULAR | Status: AC
  Administered 2020-05-25: 10 [IU] via INTRAVENOUS
  Filled 2020-05-25: qty 1

## 2020-05-25 NOTE — ED Notes (Signed)
Pt has been provided with discharge instructions. Pt denies any questions or concerns at this time. Pt verbalizes understanding for follow up care and d/c.  VSS.  Pt left department with all belongings.  

## 2020-05-25 NOTE — ED Provider Notes (Signed)
[facility] Emergency Department Provider Note   ____________________________________________    I have reviewed the triage vital signs and the nursing notes.   HISTORY  Chief Complaint Hyperglycemia and Abdominal Pain     HPI Erica Mercer is a 65 y.o. female with history of bipolar disorder, CAD, diabetes, hypertension who presents with complaints of abdominal discomfort, nausea, high glucose and high blood pressure.  This is a similar presentation to when she was admitted on 27 April last week, as well as several prior encounters for similar complaints.  She denies fevers or chills, no dysuria reported, positive urinary frequency.  She did have a CT scan performed on the 27th which was unremarkable  Past Medical History:  Diagnosis Date  . Bipolar 1 disorder ([place])   . CAD (coronary artery disease)   . Diabetes mellitus without complication ([place])   . HLD (hyperlipidemia)   . HTN (hypertension)     Patient Active Problem List   Diagnosis Date Noted  . Stage 3a chronic kidney disease ([place])   . CAD (coronary artery disease) 05/05/2020  . Hyperosmolar hyperglycemic state (HHS) ([place]) 05/05/2020  . Abdominal pain 05/05/2020  . GERD (gastroesophageal reflux disease) 05/05/2020  . Tobacco abuse 05/05/2020  . Left arm numbness 05/05/2020  . Depression 05/05/2020  . Hyperglycemia   . AKI (acute kidney injury) ([place]) 04/30/2020  . 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[place]) 04/30/2020  . Severe sepsis with acute organ dysfunction ([place]) 04/30/2020  . DKA, type 1 ([place]) 02/26/2020  . Acute renal failure ([place])   . DKA (diabetic ketoacidosis) ([place]) 02/25/2020  . Malnutrition ([place]) 02/25/2020  . Essential hypertension 02/25/2020  . HLD (hyperlipidemia) 02/25/2020  . Soft tissue mass 02/25/2020  . H/O cocaine abuse ([place]) 02/17/2018  . Bipolar 1 disorder ([place]) 04/22/2016  . Old myocardial infarction 04/22/2016  . COPD (chronic obstructive  pulmonary disease) ([place]) 01/22/2016    Past Surgical History:  Procedure Laterality Date  . COLONOSCOPY    . ESOPHAGOGASTRODUODENOSCOPY      Prior to Admission medications   Medication Sig Start Date End Date Taking? Authorizing Provider  albuterol (VENTOLIN HFA) 108 (90 Base) MCG/ACT inhaler Inhale 2 puffs into the lungs every 6 (six) hours as needed for wheezing. 05/07/20 05/02/21  Loletha Grayer, MD  amLODipine (NORVASC) 10 MG tablet Take 1 tablet (10 mg total) by mouth daily. 05/07/20 05/02/21  Loletha Grayer, MD  atorvastatin (LIPITOR) 40 MG tablet Take 1 tablet (40 mg total) by mouth at bedtime. 05/07/20 05/02/21  Loletha Grayer, MD  docusate sodium (COLACE) 100 MG capsule Take 1 capsule (100 mg total) by mouth 2 (two) times daily. 03/17/20 06/15/20  Harvest Dark, MD  insulin glargine (LANTUS SOLOSTAR) 100 UNIT/ML Solostar Pen Inject 25 Units into the skin daily. 05/07/20 05/02/21  Loletha Grayer, MD  Insulin Pen Needle 32G X 4 MM MISC 1 Dose by Does not apply route 4 (four) times daily -  before meals and at bedtime. 05/07/20   Loletha Grayer, MD  losartan (COZAAR) 50 MG tablet Take 1 tablet (50 mg total) by mouth daily. 05/07/20 05/02/21  Loletha Grayer, MD  mirtazapine (REMERON) 30 MG tablet Take 1 tablet (30 mg total) by mouth at bedtime. 05/07/20   Loletha Grayer, MD  nicotine polacrilex (NICORETTE) 4 MG gum Take by mouth. Patient not taking: No sig reported 01/24/20   [provider]  omeprazole (PRILOSEC) 40 MG capsule Take 1 capsule (40 mg total) by mouth daily. 05/07/20 08/05/20  Loletha Grayer, MD  ondansetron (ZOFRAN ODT) 4 MG disintegrating tablet Take 1 tablet (4 mg total) by mouth every 6 (six) hours as needed for nausea or vomiting. 05/22/20   Enzo Bi, MD  polyethylene glycol (MIRALAX / GLYCOLAX) 17 g packet Take 34 g by mouth daily. 05/22/20   Enzo Bi, MD  pregabalin (LYRICA) 50 MG capsule Take 50 mg by mouth 2 (two) times daily. 11/09/19   [provider]  REXULTI 1 MG TABS tablet Take 1 mg by mouth every morning. 03/11/20   [provider]     Allergies Patient has no known allergies.  Family History  Problem Relation Age of Onset  . Heart disease Mother   . Prostate cancer Father     Social History Social History   Tobacco Use  . Smoking status: Current Every Day Smoker  . Smokeless tobacco: Never Used  Vaping Use  . Vaping Use: Never used  Substance Use Topics  . Alcohol use: Not Currently  . Drug use: Yes    Frequency: 3.0 times per week    Types: Marijuana    Review of Systems  Constitutional: No fever/chills Eyes: No visual changes.  ENT: No sore throat. Cardiovascular: Denies chest pain. Respiratory: Denies shortness of breath. Gastrointestinal: As above Genitourinary: Negative for dysuria. Musculoskeletal: Negative for back pain. Skin: Negative for rash. Neurological: Negative for headaches or weakness   ____________________________________________   PHYSICAL EXAM:  VITAL SIGNS: ED Triage Vitals  Enc Vitals Group     BP 05/25/20 1145 (!) 192/96     Pulse Rate 05/25/20 1145 91     Resp 05/25/20 1151 (!) 22     Temp 05/25/20 1151 98.3 F (36.8 C)     Temp Source 05/25/20 1151 Oral     SpO2 05/25/20 1145 97 %     Weight 05/25/20 1145 49 kg (108 lb)     Height 05/25/20 1145 1.575 m (5\' 2" )     Head Circumference --      Peak Flow --      Pain Score 05/25/20 1145 6     Pain Loc --      Pain Edu? --      Excl. in [place]? --     Constitutional: Alert and oriented  Nose: No congestion/rhinnorhea. Mouth/Throat: Mucous membranes are moist.   Neck:  Painless ROM Cardiovascular: Normal rate, regular rhythm. Grossly normal heart sounds.  Good peripheral circulation. Respiratory: Normal respiratory effort.  No retractions. Lungs CTAB. Gastrointestinal: Soft and nontender. No distention.  Reassuring exam  Musculoskeletal: No lower extremity tenderness nor edema.  Warm and well  perfused Neurologic:  Normal speech and language. No gross focal neurologic deficits are appreciated.  Skin:  Skin is warm, dry and intact. No rash noted. Psychiatric: Mood and affect are normal. Speech and behavior are normal.  ____________________________________________   LABS (all labs ordered are listed, but only abnormal results are displayed)  Labs Reviewed  CBC - Abnormal; Notable for the following components:      Result Value   RBC 3.18 (*)    Hemoglobin 10.4 (*)    HCT 31.7 (*)    All other components within normal limits  COMPREHENSIVE METABOLIC PANEL - Abnormal; Notable for the following components:   Sodium 132 (*)    Chloride 96 (*)    Glucose, Bld 661 (*)    BUN 30 (*)    Creatinine, Ser 1.29 (*)    Albumin 3.1 (*)  GFR, Estimated 46 (*)    All other components within normal limits  BLOOD GAS, VENOUS - Abnormal; Notable for the following components:   Bicarbonate 32.3 (*)    Acid-Base Excess 6.8 (*)    All other components within normal limits  CBG MONITORING, ED - Abnormal; Notable for the following components:   Glucose-Capillary 411 (*)    All other components within normal limits  BETA-HYDROXYBUTYRIC ACID  LIPASE, BLOOD  URINALYSIS, COMPLETE (UACMP) WITH MICROSCOPIC   ____________________________________________  EKG  None ____________________________________________  RADIOLOGY  CT abdomen pelvis performed on 27 April unremarkable ____________________________________________   PROCEDURES  Procedure(s) performed: No  Procedures   Critical Care performed: No ____________________________________________   INITIAL IMPRESSION / ASSESSMENT AND PLAN / ED COURSE  Pertinent labs & imaging results that were available during my care of the patient were reviewed by me and considered in my medical decision making (see chart for details).  Patient presents with generalized abdominal discomfort, nausea vomiting and hyperglycemia.  Review of  record demonstrates multiple presentations for similar complaints in the past.  Extensive work-up has not revealed any etiology of abdominal discomfort, question compliance with insulin.  Elevated glucose today, anion gap is normal, pending beta and VBG  Beta hydroxybutyrate acid is normal, VBG is reassuring lab work otherwise unremarkable  Patient feeling improved after analgesics.  Glucose is improving after insulin and fluids  Appropriate for discharge at this time with close outpatient follow-up    ____________________________________________   FINAL CLINICAL IMPRESSION(S) / ED DIAGNOSES  Final diagnoses:  Hyperglycemia  Epigastric pain        Note:  This document was prepared using Dragon voice recognition software and may include unintentional dictation errors.   Lavonia Drafts, MD 05/25/20 1421

## 2020-05-25 NOTE — ED Triage Notes (Signed)
Pt BIB EMS from home for complaints of n/v/abd pain and back pain.  EMS states she had "high sugar" reading on their monitor. Pt states she has been getting same reading on her home monitor.  Hx of DKA

## 2020-06-03 ENCOUNTER — Other Ambulatory Visit: Payer: Self-pay

## 2020-06-03 ENCOUNTER — Emergency Department
Admission: EM | Admit: 2020-06-03 | Discharge: 2020-06-03 | Disposition: A | Discharge status: Home / Self Care | Payer: 59 | Attending: Emergency Medicine | Admitting: Emergency Medicine

## 2020-06-03 DIAGNOSIS — N1831 Chronic kidney disease, stage 3a: Secondary | ICD-10-CM | POA: Insufficient documentation

## 2020-06-03 DIAGNOSIS — K3184 Gastroparesis: Secondary | ICD-10-CM

## 2020-06-03 DIAGNOSIS — Z794 Long term (current) use of insulin: Secondary | ICD-10-CM | POA: Insufficient documentation

## 2020-06-03 DIAGNOSIS — F17200 Nicotine dependence, unspecified, uncomplicated: Secondary | ICD-10-CM | POA: Diagnosis not present

## 2020-06-03 DIAGNOSIS — Z79899 Other long term (current) drug therapy: Secondary | ICD-10-CM | POA: Insufficient documentation

## 2020-06-03 DIAGNOSIS — I2510 Atherosclerotic heart disease of native coronary artery without angina pectoris: Secondary | ICD-10-CM | POA: Insufficient documentation

## 2020-06-03 DIAGNOSIS — R1013 Epigastric pain: Secondary | ICD-10-CM | POA: Diagnosis present

## 2020-06-03 DIAGNOSIS — E1022 Type 1 diabetes mellitus with diabetic chronic kidney disease: Secondary | ICD-10-CM | POA: Insufficient documentation

## 2020-06-03 DIAGNOSIS — E1043 Type 1 diabetes mellitus with diabetic autonomic (poly)neuropathy: Secondary | ICD-10-CM | POA: Diagnosis not present

## 2020-06-03 DIAGNOSIS — I129 Hypertensive chronic kidney disease with stage 1 through stage 4 chronic kidney disease, or unspecified chronic kidney disease: Secondary | ICD-10-CM | POA: Diagnosis not present

## 2020-06-03 DIAGNOSIS — J449 Chronic obstructive pulmonary disease, unspecified: Secondary | ICD-10-CM | POA: Diagnosis not present

## 2020-06-03 DIAGNOSIS — I10 Essential (primary) hypertension: Secondary | ICD-10-CM

## 2020-06-03 LAB — CBC
HCT: 38.3 % (ref 36.0–46.0)
Hemoglobin: 12.7 g/dL (ref 12.0–15.0)
MCH: 32.8 pg (ref 26.0–34.0)
MCHC: 33.2 g/dL (ref 30.0–36.0)
MCV: 99 fL (ref 80.0–100.0)
Platelets: 350 10*3/uL (ref 150–400)
RBC: 3.87 MIL/uL (ref 3.87–5.11)
RDW: 13.7 % (ref 11.5–15.5)
WBC: 5.9 10*3/uL (ref 4.0–10.5)
nRBC: 0 % (ref 0.0–0.2)

## 2020-06-03 LAB — URINALYSIS, COMPLETE (UACMP) WITH MICROSCOPIC
Bacteria, UA: NONE SEEN
Bilirubin Urine: NEGATIVE
Glucose, UA: 50 mg/dL — AB
Hgb urine dipstick: NEGATIVE
Ketones, ur: NEGATIVE mg/dL
Leukocytes,Ua: NEGATIVE
Nitrite: NEGATIVE
Protein, ur: >=300 mg/dL — AB
Specific Gravity, Urine: 1.016 (ref 1.005–1.030)
pH: 6 (ref 5.0–8.0)

## 2020-06-03 LAB — COMPREHENSIVE METABOLIC PANEL
ALT: 26 U/L (ref 0–44)
AST: 43 U/L — ABNORMAL HIGH (ref 15–41)
Albumin: 3.3 g/dL — ABNORMAL LOW (ref 3.5–5.0)
Alkaline Phosphatase: 128 U/L — ABNORMAL HIGH (ref 38–126)
Anion gap: 10 (ref 5–15)
BUN: 35 mg/dL — ABNORMAL HIGH (ref 8–23)
CO2: 25 mmol/L (ref 22–32)
Calcium: 10 mg/dL (ref 8.9–10.3)
Chloride: 99 mmol/L (ref 98–111)
Creatinine, Ser: 1.34 mg/dL — ABNORMAL HIGH (ref 0.44–1.00)
GFR, Estimated: 44 mL/min — ABNORMAL LOW (ref >60)
Glucose, Bld: 184 mg/dL — ABNORMAL HIGH (ref 70–99)
Potassium: 4.4 mmol/L (ref 3.5–5.1)
Sodium: 134 mmol/L — ABNORMAL LOW (ref 135–145)
Total Bilirubin: 0.9 mg/dL (ref 0.3–1.2)
Total Protein: 7 g/dL (ref 6.5–8.1)

## 2020-06-03 LAB — LIPASE, BLOOD: Lipase: 25 U/L (ref 11–51)

## 2020-06-03 LAB — POC URINE PREG, ED: Preg Test, Ur: NEGATIVE

## 2020-06-03 MED ORDER — METOCLOPRAMIDE HCL 5 MG/ML IJ SOLN
10.0000 mg | Freq: Once | INTRAMUSCULAR | Status: AC
  Administered 2020-06-03: 10 mg via INTRAVENOUS
  Filled 2020-06-03: qty 2

## 2020-06-03 MED ORDER — LOSARTAN POTASSIUM 50 MG PO TABS: 50.0000 mg | ORAL_TABLET | Freq: Every day | ORAL | 0 refills | Status: DC

## 2020-06-03 MED ORDER — ALUM & MAG HYDROXIDE-SIMETH 200-200-20 MG/5ML PO SUSP
30.0000 mL | Freq: Once | ORAL | Status: AC
  Administered 2020-06-03: 30 mL via ORAL
  Filled 2020-06-03: qty 30

## 2020-06-03 MED ORDER — OMEPRAZOLE 40 MG PO CPDR: 40.0000 mg | DELAYED_RELEASE_CAPSULE | Freq: Every day | ORAL | 0 refills | Status: DC

## 2020-06-03 MED ORDER — LIDOCAINE VISCOUS HCL 2 % MT SOLN
15.0000 mL | Freq: Once | OROMUCOSAL | Status: AC
  Administered 2020-06-03: 15 mL via ORAL
  Filled 2020-06-03: qty 15

## 2020-06-03 MED ORDER — SUCRALFATE 1 GM/10ML PO SUSP: 1.0000 g | Freq: Four times a day (QID) | ORAL | 1 refills | Status: DC

## 2020-06-03 MED ORDER — METOCLOPRAMIDE HCL 5 MG PO TABS: 5.0000 mg | ORAL_TABLET | Freq: Three times a day (TID) | ORAL | 0 refills | Status: DC

## 2020-06-03 MED ORDER — AMLODIPINE BESYLATE 10 MG PO TABS
10.0000 mg | ORAL_TABLET | Freq: Every day | ORAL | 0 refills | Status: DC
Start: 2020-06-03 — End: 2020-08-03

## 2020-06-03 MED ORDER — SODIUM CHLORIDE 0.9 % IV BOLUS
1000.0000 mL | Freq: Once | INTRAVENOUS | Status: AC
  Administered 2020-06-03: 1000 mL via INTRAVENOUS

## 2020-06-03 MED ORDER — PREGABALIN 50 MG PO CAPS: 50.0000 mg | ORAL_CAPSULE | Freq: Two times a day (BID) | ORAL | 0 refills | Status: DC

## 2020-06-03 NOTE — ED Provider Notes (Signed)
[facility] Emergency Department Provider Note   ____________________________________________   Event Date/Time   First MD Initiated Contact with Patient 06/03/20 [phone]     (approximate)  I have reviewed the triage vital signs and the nursing notes.   HISTORY  Chief Complaint Hypertension and Abdominal Pain    HPI Erica Mercer is a 65 y.o. female with below stated past medical history who presents for midepigastric abdominal pain with nausea.  Patient has been seen multiple times in the past few months for similar symptoms and is found to be just gastritis as well as evidence of gastroparesis.  Patient endorses 10/10, nonradiating, midepigastric abdominal pain with associated nausea without vomiting.  Patient states that she has kept a closer eye on her blood sugars as she now "knows how to use her insulin".  Patient does state however that she has been trying to switch her medications to a new pharmacy and has been out of them for the past 2 days as well as will be out of them for the next 3 days.  Patient currently denies any vision changes, tinnitus, difficulty speaking, facial droop, sore throat, chest pain, shortness of breath, vomiting/diarrhea, dysuria, or weakness/numbness/paresthesias in any extremity         Past Medical History:  Diagnosis Date  . Bipolar 1 disorder ([place])   . CAD (coronary artery disease)   . Diabetes mellitus without complication ([place])   . HLD (hyperlipidemia)   . HTN (hypertension)     Patient Active Problem List   Diagnosis Date Noted  . Stage 3a chronic kidney disease ([place])   . CAD (coronary artery disease) 05/05/2020  . Hyperosmolar hyperglycemic state (HHS) ([place]) 05/05/2020  . Abdominal pain 05/05/2020  . GERD (gastroesophageal reflux disease) 05/05/2020  . Tobacco abuse 05/05/2020  . Left arm numbness 05/05/2020  . Depression 05/05/2020  . Hyperglycemia   . AKI (acute kidney injury) ([place]) 04/30/2020  .  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[place]) 04/30/2020  . Severe sepsis with acute organ dysfunction ([place]) 04/30/2020  . DKA, type 1 ([place]) 02/26/2020  . Acute renal failure ([place])   . DKA (diabetic ketoacidosis) ([place]) 02/25/2020  . Malnutrition ([place]) 02/25/2020  . Essential hypertension 02/25/2020  . HLD (hyperlipidemia) 02/25/2020  . Soft tissue mass 02/25/2020  . H/O cocaine abuse ([place]) 02/17/2018  . Bipolar 1 disorder ([place]) 04/22/2016  . Old myocardial infarction 04/22/2016  . COPD (chronic obstructive pulmonary disease) ([place]) 01/22/2016    Past Surgical History:  Procedure Laterality Date  . COLONOSCOPY    . ESOPHAGOGASTRODUODENOSCOPY      Prior to Admission medications   Medication Sig Start Date End Date Taking? Authorizing Provider  metoCLOPramide (REGLAN) 5 MG tablet Take 1 tablet (5 mg total) by mouth 3 (three) times daily. 06/03/20 07/03/20 Yes Lossie Kalp, Vista Lawman, MD  sucralfate (CARAFATE) 1 GM/10ML suspension Take 10 mLs (1 g total) by mouth 4 (four) times daily. 06/03/20 06/03/21 Yes Bralin Garry, Vista Lawman, MD  albuterol (VENTOLIN HFA) 108 (90 Base) MCG/ACT inhaler Inhale 2 puffs into the lungs every 6 (six) hours as needed for wheezing. 05/07/20 05/02/21  Loletha Grayer, MD  amLODipine (NORVASC) 10 MG tablet Take 1 tablet (10 mg total) by mouth daily for 3 days. 06/03/20 06/06/20  Naaman Plummer, MD  atorvastatin (LIPITOR) 40 MG tablet Take 1 tablet (40 mg total) by mouth at bedtime. 05/07/20 05/02/21  Loletha Grayer, MD  docusate sodium (COLACE) 100 MG capsule Take 1 capsule (100 mg total)  by mouth 2 (two) times daily. 03/17/20 06/15/20  Harvest Dark, MD  insulin glargine (LANTUS SOLOSTAR) 100 UNIT/ML Solostar Pen Inject 25 Units into the skin daily. 05/07/20 05/02/21  Loletha Grayer, MD  Insulin Pen Needle 32G X 4 MM MISC 1 Dose by Does not apply route 4 (four) times daily -  before meals and at bedtime. 05/07/20   Loletha Grayer, MD  losartan (COZAAR) 50 MG tablet Take 1 tablet  (50 mg total) by mouth daily for 3 days. 06/03/20 06/06/20  Naaman Plummer, MD  mirtazapine (REMERON) 30 MG tablet Take 1 tablet (30 mg total) by mouth at bedtime. 05/07/20   Loletha Grayer, MD  nicotine polacrilex (NICORETTE) 4 MG gum Take by mouth. Patient not taking: No sig reported 01/24/20   [provider]  omeprazole (PRILOSEC) 40 MG capsule Take 1 capsule (40 mg total) by mouth daily for 3 days. 06/03/20 06/06/20  Naaman Plummer, MD  ondansetron (ZOFRAN ODT) 4 MG disintegrating tablet Take 1 tablet (4 mg total) by mouth every 6 (six) hours as needed for nausea or vomiting. 05/22/20   Enzo Bi, MD  polyethylene glycol (MIRALAX / GLYCOLAX) 17 g packet Take 34 g by mouth daily. 05/22/20   Enzo Bi, MD  pregabalin (LYRICA) 50 MG capsule Take 1 capsule (50 mg total) by mouth 2 (two) times daily for 3 days. 06/03/20 06/06/20  Naaman Plummer, MD  REXULTI 1 MG TABS tablet Take 1 mg by mouth every morning. 03/11/20   [provider]    Allergies Patient has no known allergies.  Family History  Problem Relation Age of Onset  . Heart disease Mother   . Prostate cancer Father     Social History Social History   Tobacco Use  . Smoking status: Current Every Day Smoker  . Smokeless tobacco: Never Used  Vaping Use  . Vaping Use: Never used  Substance Use Topics  . Alcohol use: Not Currently  . Drug use: Yes    Frequency: 3.0 times per week    Types: Marijuana    Review of Systems Constitutional: No fever/chills Eyes: No visual changes. ENT: No sore throat. Cardiovascular: Denies chest pain. Respiratory: Denies shortness of breath. Gastrointestinal: Endorses midepigastric abdominal pain and nausea.  No vomiting.  No diarrhea. Genitourinary: Negative for dysuria. Musculoskeletal: Negative for acute arthralgias Skin: Negative for rash. Neurological: Negative for headaches, weakness/numbness/paresthesias in any extremity Psychiatric: Negative for suicidal  ideation/homicidal ideation   ____________________________________________   PHYSICAL EXAM:  VITAL SIGNS: ED Triage Vitals  Enc Vitals Group     BP 06/03/20 0735 (!) 209/115     Pulse Rate 06/03/20 0735 86     Resp 06/03/20 0735 16     Temp 06/03/20 0735 98.3 F (36.8 C)     Temp Source 06/03/20 0735 Oral     SpO2 06/03/20 0735 98 %     Weight 06/03/20 0730 108 lb (49 kg)     Height 06/03/20 0730 5\' 2"  (1.575 m)     Head Circumference --      Peak Flow --      Pain Score 06/03/20 0729 6     Pain Loc --      Pain Edu? --      Excl. in [place]? --    Constitutional: Alert and oriented. Well appearing elderly African-American female in no acute distress. Eyes: Conjunctivae are normal. PERRL. Head: Atraumatic. Nose: No congestion/rhinnorhea. Mouth/Throat: Mucous membranes are moist. Neck: No stridor Cardiovascular: Grossly  normal heart sounds.  Good peripheral circulation. Respiratory: Normal respiratory effort.  No retractions. Gastrointestinal: Soft and midepigastric tenderness to palpation. No distention. Musculoskeletal: No obvious deformities Neurologic:  Normal speech and language. No gross focal neurologic deficits are appreciated. Skin:  Skin is warm and dry. No rash noted. Psychiatric: Mood and affect are normal. Speech and behavior are normal.  ____________________________________________   LABS (all labs ordered are listed, but only abnormal results are displayed)  Labs Reviewed  COMPREHENSIVE METABOLIC PANEL - Abnormal; Notable for the following components:      Result Value   Sodium 134 (*)    Glucose, Bld 184 (*)    BUN 35 (*)    Creatinine, Ser 1.34 (*)    Albumin 3.3 (*)    AST 43 (*)    Alkaline Phosphatase 128 (*)    GFR, Estimated 44 (*)    All other components within normal limits  LIPASE, BLOOD  CBC  URINALYSIS, COMPLETE (UACMP) WITH MICROSCOPIC  POC URINE PREG, ED   ____________________________________________  EKG  ED ECG REPORT I,  Naaman Plummer, the attending physician, personally viewed and interpreted this ECG.  Date: 06/03/2020 EKG Time: 0735 Rate: 88 Rhythm: normal sinus rhythm QRS Axis: normal Intervals: normal ST/T Wave abnormalities: normal Narrative Interpretation: no evidence of acute ischemia  PROCEDURES  Procedure(s) performed (including Critical Care):  .1-3 Lead EKG Interpretation Performed by: Naaman Plummer, MD Authorized by: Naaman Plummer, MD     Interpretation: normal     ECG rate:  85   ECG rate assessment: normal     Rhythm: sinus rhythm     Ectopy: none     Conduction: normal       ____________________________________________   INITIAL IMPRESSION / ASSESSMENT AND PLAN / ED COURSE  As part of my medical decision making, I reviewed the following data within the [place] notes reviewed and incorporated, Labs reviewed, EKG interpreted, Old chart reviewed, Radiograph reviewed and Notes from prior ED visits reviewed and incorporated        Patient presents for acute nausea and epigastric abdominal pain that is stable for her baseline abdominal pain given likely gastroparesis and gastritis. The cause of the patients symptoms is not clear, but the patient is overall well appearing and is suspected to have a transient course of illness.  Given History and Exam there does not appear to be an emergent cause of the symptoms such as small bowel obstruction, coronary syndrome, bowel ischemia, DKA, pancreatitis, appendicitis, other acute abdomen or other emergent problem.  Reassessment: After treatment, the patient is feeling much better, tolerating PO fluids, and shows no signs of dehydration.   Disposition: Discharge home with prompt primary care physician follow up in the next 48 hours. Strict return precautions discussed.      ____________________________________________   FINAL CLINICAL IMPRESSION(S) / ED DIAGNOSES  Final diagnoses:  Primary  hypertension  Epigastric pain  Gastroparesis     ED Discharge Orders         Ordered    amLODipine (NORVASC) 10 MG tablet  Daily        06/03/20 0850    losartan (COZAAR) 50 MG tablet  Daily        06/03/20 0850    omeprazole (PRILOSEC) 40 MG capsule  Daily        06/03/20 0850    pregabalin (LYRICA) 50 MG capsule  2 times daily        06/03/20 0850  metoCLOPramide (REGLAN) 5 MG tablet  3 times daily        06/03/20 0851    sucralfate (CARAFATE) 1 GM/10ML suspension  4 times daily        06/03/20 6834           Note:  This document was prepared using Dragon voice recognition software and may include unintentional dictation errors.   Naaman Plummer, MD 06/03/20 1009

## 2020-06-03 NOTE — ED Notes (Signed)
Pt crying in room when talking about her strained relationship with her daughter.

## 2020-06-03 NOTE — ED Triage Notes (Signed)
Pt here via [facility] with abd pain. Pt was seen here recently for same and dx with something gastric but she canaot remember. Pt has hx of DM and HTn. 203/106 and cbg 228 with ems.

## 2020-06-16 ENCOUNTER — Emergency Department
Admission: EM | Admit: 2020-06-16 | Discharge: 2020-06-16 | Disposition: A | Discharge status: Home / Self Care | Payer: Medicare Other | Attending: Emergency Medicine | Admitting: Emergency Medicine

## 2020-06-16 ENCOUNTER — Other Ambulatory Visit: Payer: Self-pay

## 2020-06-16 DIAGNOSIS — R112 Nausea with vomiting, unspecified: Secondary | ICD-10-CM | POA: Diagnosis present

## 2020-06-16 DIAGNOSIS — K2900 Acute gastritis without bleeding: Secondary | ICD-10-CM | POA: Insufficient documentation

## 2020-06-16 DIAGNOSIS — I2510 Atherosclerotic heart disease of native coronary artery without angina pectoris: Secondary | ICD-10-CM | POA: Insufficient documentation

## 2020-06-16 DIAGNOSIS — N1831 Chronic kidney disease, stage 3a: Secondary | ICD-10-CM | POA: Diagnosis not present

## 2020-06-16 DIAGNOSIS — Z79899 Other long term (current) drug therapy: Secondary | ICD-10-CM | POA: Diagnosis not present

## 2020-06-16 DIAGNOSIS — R1013 Epigastric pain: Secondary | ICD-10-CM

## 2020-06-16 DIAGNOSIS — I129 Hypertensive chronic kidney disease with stage 1 through stage 4 chronic kidney disease, or unspecified chronic kidney disease: Secondary | ICD-10-CM | POA: Insufficient documentation

## 2020-06-16 DIAGNOSIS — F17200 Nicotine dependence, unspecified, uncomplicated: Secondary | ICD-10-CM | POA: Insufficient documentation

## 2020-06-16 DIAGNOSIS — M546 Pain in thoracic spine: Secondary | ICD-10-CM | POA: Insufficient documentation

## 2020-06-16 DIAGNOSIS — E1022 Type 1 diabetes mellitus with diabetic chronic kidney disease: Secondary | ICD-10-CM | POA: Insufficient documentation

## 2020-06-16 DIAGNOSIS — J449 Chronic obstructive pulmonary disease, unspecified: Secondary | ICD-10-CM | POA: Diagnosis not present

## 2020-06-16 DIAGNOSIS — E1010 Type 1 diabetes mellitus with ketoacidosis without coma: Secondary | ICD-10-CM | POA: Insufficient documentation

## 2020-06-16 LAB — CBC WITH DIFFERENTIAL/PLATELET
Abs Immature Granulocytes: 0 10*3/uL (ref 0.00–0.07)
Basophils Absolute: 0.1 10*3/uL (ref 0.0–0.1)
Basophils Relative: 1 %
Eosinophils Absolute: 0 10*3/uL (ref 0.0–0.5)
Eosinophils Relative: 1 %
HCT: 34.6 % — ABNORMAL LOW (ref 36.0–46.0)
Hemoglobin: 11.7 g/dL — ABNORMAL LOW (ref 12.0–15.0)
Immature Granulocytes: 0 %
Lymphocytes Relative: 53 %
Lymphs Abs: 2.1 10*3/uL (ref 0.7–4.0)
MCH: 32.4 pg (ref 26.0–34.0)
MCHC: 33.8 g/dL (ref 30.0–36.0)
MCV: 95.8 fL (ref 80.0–100.0)
Monocytes Absolute: 0.2 10*3/uL (ref 0.1–1.0)
Monocytes Relative: 4 %
Neutro Abs: 1.6 10*3/uL — ABNORMAL LOW (ref 1.7–7.7)
Neutrophils Relative %: 41 %
Platelets: 285 10*3/uL (ref 150–400)
RBC: 3.61 MIL/uL — ABNORMAL LOW (ref 3.87–5.11)
RDW: 12.9 % (ref 11.5–15.5)
WBC: 3.9 10*3/uL — ABNORMAL LOW (ref 4.0–10.5)
nRBC: 0 % (ref 0.0–0.2)

## 2020-06-16 LAB — COMPREHENSIVE METABOLIC PANEL
ALT: 13 U/L (ref 0–44)
AST: 19 U/L (ref 15–41)
Albumin: 3.2 g/dL — ABNORMAL LOW (ref 3.5–5.0)
Alkaline Phosphatase: 78 U/L (ref 38–126)
Anion gap: 11 (ref 5–15)
BUN: 42 mg/dL — ABNORMAL HIGH (ref 8–23)
CO2: 26 mmol/L (ref 22–32)
Calcium: 9.5 mg/dL (ref 8.9–10.3)
Chloride: 97 mmol/L — ABNORMAL LOW (ref 98–111)
Creatinine, Ser: 1.49 mg/dL — ABNORMAL HIGH (ref 0.44–1.00)
GFR, Estimated: 39 mL/min — ABNORMAL LOW (ref >60)
Glucose, Bld: 195 mg/dL — ABNORMAL HIGH (ref 70–99)
Potassium: 4.1 mmol/L (ref 3.5–5.1)
Sodium: 134 mmol/L — ABNORMAL LOW (ref 135–145)
Total Bilirubin: 0.9 mg/dL (ref 0.3–1.2)
Total Protein: 6.6 g/dL (ref 6.5–8.1)

## 2020-06-16 LAB — LIPASE, BLOOD: Lipase: 48 U/L (ref 11–51)

## 2020-06-16 MED ORDER — METOCLOPRAMIDE HCL 10 MG PO TABS: 10.0000 mg | ORAL_TABLET | Freq: Four times a day (QID) | ORAL | 0 refills | Status: AC | PRN

## 2020-06-16 MED ORDER — FAMOTIDINE 20 MG PO TABS
40.0000 mg | ORAL_TABLET | Freq: Once | ORAL | Status: AC
  Administered 2020-06-16: 40 mg via ORAL
  Filled 2020-06-16: qty 2

## 2020-06-16 MED ORDER — ALUMINUM-MAGNESIUM-SIMETHICONE 200-200-20 MG/5ML PO SUSP: 30.0000 mL | Freq: Three times a day (TID) | ORAL | 0 refills | Status: DC

## 2020-06-16 MED ORDER — ONDANSETRON HCL 4 MG/2ML IJ SOLN
4.0000 mg | Freq: Once | INTRAMUSCULAR | Status: AC
  Administered 2020-06-16: 4 mg via INTRAVENOUS
  Filled 2020-06-16: qty 2

## 2020-06-16 MED ORDER — METOCLOPRAMIDE HCL 10 MG PO TABS
10.0000 mg | ORAL_TABLET | Freq: Once | ORAL | Status: AC
  Administered 2020-06-16: 10 mg via ORAL
  Filled 2020-06-16: qty 1

## 2020-06-16 MED ORDER — KETOROLAC TROMETHAMINE 30 MG/ML IJ SOLN
15.0000 mg | INTRAMUSCULAR | Status: AC
  Administered 2020-06-16: 15 mg via INTRAVENOUS
  Filled 2020-06-16: qty 1

## 2020-06-16 MED ORDER — ALUM & MAG HYDROXIDE-SIMETH 200-200-20 MG/5ML PO SUSP
30.0000 mL | Freq: Once | ORAL | Status: AC
  Administered 2020-06-16: 30 mL via ORAL
  Filled 2020-06-16: qty 30

## 2020-06-16 MED ORDER — FAMOTIDINE 20 MG PO TABS: 20.0000 mg | ORAL_TABLET | Freq: Two times a day (BID) | ORAL | 0 refills | Status: DC

## 2020-06-16 MED ORDER — SODIUM CHLORIDE 0.9 % IV BOLUS
1000.0000 mL | Freq: Once | INTRAVENOUS | Status: AC
  Administered 2020-06-16: 1000 mL via INTRAVENOUS

## 2020-06-16 NOTE — ED Provider Notes (Signed)
[facility] Emergency Department Provider Note  ____________________________________________  Time seen: Approximately 11:17 AM  I have reviewed the triage vital signs and the nursing notes.   HISTORY  Chief Complaint Nausea and Emesis    HPI Erica Mercer is a 65 y.o. female with a history of hypertension diabetes bipolar disorder who comes ED complaining of nausea vomiting for the past 3 days, with upper abdominal pain as well.  Radiates to the mid back.  No aggravating or alleviating factors.  No exertional or pleuritic symptoms.  No chest pain or shortness of breath.  No fever diarrhea or constipation.  She notes that she had been on some medicine for acid reflux, but ran out of it 5 days ago.  Review of EMR shows that this was omeprazole.      Past Medical History:  Diagnosis Date  . Bipolar 1 disorder ([place])   . CAD (coronary artery disease)   . Diabetes mellitus without complication ([place])   . HLD (hyperlipidemia)   . HTN (hypertension)      Patient Active Problem List   Diagnosis Date Noted  . Stage 3a chronic kidney disease ([place])   . CAD (coronary artery disease) 05/05/2020  . Hyperosmolar hyperglycemic state (HHS) ([place]) 05/05/2020  . Abdominal pain 05/05/2020  . GERD (gastroesophageal reflux disease) 05/05/2020  . Tobacco abuse 05/05/2020  . Left arm numbness 05/05/2020  . Depression 05/05/2020  . Hyperglycemia   . AKI (acute kidney injury) ([place]) 04/30/2020  . 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[place]) 04/30/2020  . Severe sepsis with acute organ dysfunction ([place]) 04/30/2020  . DKA, type 1 ([place]) 02/26/2020  . Acute renal failure ([place])   . DKA (diabetic ketoacidosis) ([place]) 02/25/2020  . Malnutrition ([place]) 02/25/2020  . Essential hypertension 02/25/2020  . HLD (hyperlipidemia) 02/25/2020  . Soft tissue mass 02/25/2020  . H/O cocaine abuse ([place]) 02/17/2018  . Bipolar 1 disorder ([place]) 04/22/2016  . Old myocardial infarction  04/22/2016  . COPD (chronic obstructive pulmonary disease) ([place]) 01/22/2016     Past Surgical History:  Procedure Laterality Date  . COLONOSCOPY    . ESOPHAGOGASTRODUODENOSCOPY       Prior to Admission medications   Medication Sig Start Date End Date Taking? Authorizing Provider  aluminum-magnesium hydroxide-simethicone (MAALOX) [phone] MG/5ML SUSP Take 30 mLs by mouth 4 (four) times daily -  before meals and at bedtime. 06/16/20  Yes Carrie Mew, MD  famotidine (PEPCID) 20 MG tablet Take 1 tablet (20 mg total) by mouth 2 (two) times daily. 06/16/20  Yes Carrie Mew, MD  metoCLOPramide (REGLAN) 10 MG tablet Take 1 tablet (10 mg total) by mouth every 6 (six) hours as needed. 06/16/20  Yes Carrie Mew, MD  albuterol (VENTOLIN HFA) 108 (90 Base) MCG/ACT inhaler Inhale 2 puffs into the lungs every 6 (six) hours as needed for wheezing. 05/07/20 05/02/21  Loletha Grayer, MD  amLODipine (NORVASC) 10 MG tablet Take 1 tablet (10 mg total) by mouth daily for 3 days. 06/03/20 06/06/20  Naaman Plummer, MD  atorvastatin (LIPITOR) 40 MG tablet Take 1 tablet (40 mg total) by mouth at bedtime. 05/07/20 05/02/21  Loletha Grayer, MD  insulin glargine (LANTUS SOLOSTAR) 100 UNIT/ML Solostar Pen Inject 25 Units into the skin daily. 05/07/20 05/02/21  Loletha Grayer, MD  Insulin Pen Needle 32G X 4 MM MISC 1 Dose by Does not apply route 4 (four) times daily -  before meals and at bedtime. 05/07/20   Loletha Grayer, MD  losartan (COZAAR)  50 MG tablet Take 1 tablet (50 mg total) by mouth daily for 3 days. 06/03/20 06/06/20  Naaman Plummer, MD  mirtazapine (REMERON) 30 MG tablet Take 1 tablet (30 mg total) by mouth at bedtime. 05/07/20   Loletha Grayer, MD  nicotine polacrilex (NICORETTE) 4 MG gum Take by mouth. Patient not taking: No sig reported 01/24/20   [provider]  omeprazole (PRILOSEC) 40 MG capsule Take 1 capsule (40 mg total) by mouth daily for 3 days. 06/03/20 06/06/20  Naaman Plummer, MD  ondansetron (ZOFRAN ODT) 4 MG disintegrating tablet Take 1 tablet (4 mg total) by mouth every 6 (six) hours as needed for nausea or vomiting. 05/22/20   Enzo Bi, MD  polyethylene glycol (MIRALAX / GLYCOLAX) 17 g packet Take 34 g by mouth daily. 05/22/20   Enzo Bi, MD  pregabalin (LYRICA) 50 MG capsule Take 1 capsule (50 mg total) by mouth 2 (two) times daily for 3 days. 06/03/20 06/06/20  Naaman Plummer, MD  REXULTI 1 MG TABS tablet Take 1 mg by mouth every morning. 03/11/20   [provider]  sucralfate (CARAFATE) 1 GM/10ML suspension Take 10 mLs (1 g total) by mouth 4 (four) times daily. 06/03/20 06/03/21  Naaman Plummer, MD     Allergies Patient has no known allergies.   Family History  Problem Relation Age of Onset  . Heart disease Mother   . Prostate cancer Father     Social History Social History   Tobacco Use  . Smoking status: Current Every Day Smoker  . Smokeless tobacco: Never Used  Vaping Use  . Vaping Use: Never used  Substance Use Topics  . Alcohol use: Not Currently  . Drug use: Yes    Frequency: 3.0 times per week    Types: Marijuana    Review of Systems  Constitutional:   No fever or chills.  ENT:   No sore throat. No rhinorrhea. Cardiovascular:   No chest pain or syncope. Respiratory:   No dyspnea or cough. Gastrointestinal:   Positive as above for abdominal pain and vomiting Musculoskeletal:   Negative for focal pain or swelling All other systems reviewed and are negative except as documented above in ROS and HPI.  ____________________________________________   PHYSICAL EXAM:  VITAL SIGNS: ED Triage Vitals  Enc Vitals Group     BP 06/16/20 0819 (!) 167/101     Pulse Rate 06/16/20 0819 74     Resp 06/16/20 0819 16     Temp 06/16/20 0827 98.6 F (37 C)     Temp src --      SpO2 06/16/20 0819 100 %     Weight 06/16/20 0820 108 lb 0.4 oz (49 kg)     Height 06/16/20 0820 5\' 2"  (1.575 m)     Head Circumference --      Peak  Flow --      Pain Score 06/16/20 0820 7     Pain Loc --      Pain Edu? --      Excl. in [place]? --     Vital signs reviewed, nursing assessments reviewed.   Constitutional:   Alert and oriented. Non-toxic appearance. Eyes:   Conjunctivae are normal. EOMI. PERRL. ENT      Head:   Normocephalic and atraumatic.      Nose:   Wearing a mask.      Mouth/Throat:   Wearing a mask.      Neck:   No meningismus.  Full ROM. Hematological/Lymphatic/Immunilogical:   No cervical lymphadenopathy. Cardiovascular:   RRR. Symmetric bilateral radial and DP pulses.  No murmurs. Cap refill less than 2 seconds. Respiratory:   Normal respiratory effort without tachypnea/retractions. Breath sounds are clear and equal bilaterally. No wheezes/rales/rhonchi. Gastrointestinal:   Soft with epigastric tenderness. Non distended. There is no CVA tenderness.  No rebound, rigidity, or guarding. Genitourinary:   deferred Musculoskeletal:   Normal range of motion in all extremities. No joint effusions.  No lower extremity tenderness.  No edema. Neurologic:   Normal speech and language.  Motor grossly intact. No acute focal neurologic deficits are appreciated.  Skin:    Skin is warm, dry and intact. No rash noted.  No petechiae, purpura, or bullae.  ____________________________________________    LABS (pertinent positives/negatives) (all labs ordered are listed, but only abnormal results are displayed) Labs Reviewed  COMPREHENSIVE METABOLIC PANEL - Abnormal; Notable for the following components:      Result Value   Sodium 134 (*)    Chloride 97 (*)    Glucose, Bld 195 (*)    BUN 42 (*)    Creatinine, Ser 1.49 (*)    Albumin 3.2 (*)    GFR, Estimated 39 (*)    All other components within normal limits  CBC WITH DIFFERENTIAL/PLATELET - Abnormal; Notable for the following components:   WBC 3.9 (*)    RBC 3.61 (*)    Hemoglobin 11.7 (*)    HCT 34.6 (*)    Neutro Abs 1.6 (*)    All other components within normal  limits  LIPASE, BLOOD  URINALYSIS, COMPLETE (UACMP) WITH MICROSCOPIC   ____________________________________________   EKG    ____________________________________________    RADIOLOGY  No results found.  ____________________________________________   PROCEDURES Procedures  ____________________________________________  DIFFERENTIAL DIAGNOSIS   Pancreatitis, biliary disease, gastritis, PPI rebound, viral illness, dehydration, electrolyte abnormality  CLINICAL IMPRESSION / ASSESSMENT AND PLAN / ED COURSE  Medications ordered in the ED: Medications  alum & mag hydroxide-simeth (MAALOX/MYLANTA) 200-200-20 MG/5ML suspension 30 mL (has no administration in time range)  famotidine (PEPCID) tablet 40 mg (has no administration in time range)  metoCLOPramide (REGLAN) tablet 10 mg (has no administration in time range)  sodium chloride 0.9 % bolus 1,000 mL (1,000 mLs Intravenous New Bag/Given 06/16/20 0902)  ketorolac (TORADOL) 30 MG/ML injection 15 mg (15 mg Intravenous Given 06/16/20 0903)  ondansetron (ZOFRAN) injection 4 mg (4 mg Intravenous Given 06/16/20 1610)    Pertinent labs & imaging results that were available during my care of the patient were reviewed by me and considered in my medical decision making (see chart for details).  Erica Mercer was evaluated in Emergency Department on 06/16/2020 for the symptoms described in the history of present illness. She was evaluated in the context of the global COVID-19 pandemic, which necessitated consideration that the patient might be at risk for infection with the SARS-CoV-2 virus that causes COVID-19. Institutional protocols and algorithms that pertain to the evaluation of patients at risk for COVID-19 are in a state of rapid change based on information released by regulatory bodies including the CDC and federal and state organizations. These policies and algorithms were followed during the patient's care in the ED.   Patient  presents with upper abdominal pain, epigastric tenderness, vomiting.  Vital signs unremarkable.  Doubt ACS PE dissection AAA.  Most likely GERD/gastritis in the setting of PPI discontinuation.  Patient given Toradol Zofran and fluids and feels much better, ambulatory, voiding spontaneously.  Requested  dose of antacids and new prescription for antacids.  Stable for discharge.   Considering the patient's symptoms, medical history, and physical examination today, I have low suspicion for cholecystitis or biliary pathology, pancreatitis, perforation or bowel obstruction, hernia, intra-abdominal abscess, AAA or dissection, volvulus or intussusception, mesenteric ischemia, or appendicitis.        ____________________________________________   FINAL CLINICAL IMPRESSION(S) / ED DIAGNOSES    Final diagnoses:  Epigastric pain  Acute gastritis without hemorrhage, unspecified gastritis type     ED Discharge Orders         Ordered    aluminum-magnesium hydroxide-simethicone (MAALOX) 200-200-20 MG/5ML SUSP  3 times daily before meals & bedtime        06/16/20 1117    famotidine (PEPCID) 20 MG tablet  2 times daily        06/16/20 1117    metoCLOPramide (REGLAN) 10 MG tablet  Every 6 hours PRN        06/16/20 1117          Portions of this note were generated with dragon dictation software. Dictation errors may occur despite best attempts at proofreading.   Carrie Mew, MD 06/16/20 1120

## 2020-06-16 NOTE — ED Triage Notes (Signed)
Pt to ER via [facility] with complaints of nausea and vomiting since Friday. Reports unable to tolerate any fluids or food since Friday. Also reports generalized abdominal pain and lower back pain. Denies diarrhea or urinary symptoms.   Ems cbg 203.   Hx of HTN/ diabetes. Unable to tolerate PO medications.

## 2020-06-28 ENCOUNTER — Emergency Department
Admission: EM | Admit: 2020-06-28 | Discharge: 2020-06-28 | Disposition: A | Discharge status: Home / Self Care | Payer: Medicare Other | Attending: Emergency Medicine | Admitting: Emergency Medicine

## 2020-06-28 ENCOUNTER — Other Ambulatory Visit: Payer: Self-pay

## 2020-06-28 DIAGNOSIS — F17200 Nicotine dependence, unspecified, uncomplicated: Secondary | ICD-10-CM | POA: Insufficient documentation

## 2020-06-28 DIAGNOSIS — E1065 Type 1 diabetes mellitus with hyperglycemia: Secondary | ICD-10-CM | POA: Diagnosis not present

## 2020-06-28 DIAGNOSIS — R739 Hyperglycemia, unspecified: Secondary | ICD-10-CM | POA: Diagnosis present

## 2020-06-28 DIAGNOSIS — N1831 Chronic kidney disease, stage 3a: Secondary | ICD-10-CM | POA: Insufficient documentation

## 2020-06-28 DIAGNOSIS — Z79899 Other long term (current) drug therapy: Secondary | ICD-10-CM | POA: Insufficient documentation

## 2020-06-28 DIAGNOSIS — I129 Hypertensive chronic kidney disease with stage 1 through stage 4 chronic kidney disease, or unspecified chronic kidney disease: Secondary | ICD-10-CM | POA: Diagnosis not present

## 2020-06-28 DIAGNOSIS — Z794 Long term (current) use of insulin: Secondary | ICD-10-CM | POA: Insufficient documentation

## 2020-06-28 DIAGNOSIS — R1013 Epigastric pain: Secondary | ICD-10-CM | POA: Insufficient documentation

## 2020-06-28 DIAGNOSIS — E1022 Type 1 diabetes mellitus with diabetic chronic kidney disease: Secondary | ICD-10-CM | POA: Insufficient documentation

## 2020-06-28 DIAGNOSIS — I2510 Atherosclerotic heart disease of native coronary artery without angina pectoris: Secondary | ICD-10-CM | POA: Insufficient documentation

## 2020-06-28 DIAGNOSIS — J449 Chronic obstructive pulmonary disease, unspecified: Secondary | ICD-10-CM | POA: Diagnosis not present

## 2020-06-28 LAB — BASIC METABOLIC PANEL
Anion gap: 15 (ref 5–15)
BUN: 51 mg/dL — ABNORMAL HIGH (ref 8–23)
CO2: 25 mmol/L (ref 22–32)
Calcium: 9.7 mg/dL (ref 8.9–10.3)
Chloride: 87 mmol/L — ABNORMAL LOW (ref 98–111)
Creatinine, Ser: 1.89 mg/dL — ABNORMAL HIGH (ref 0.44–1.00)
GFR, Estimated: 29 mL/min — ABNORMAL LOW (ref >60)
Glucose, Bld: 740 mg/dL (ref 70–99)
Potassium: 5.4 mmol/L — ABNORMAL HIGH (ref 3.5–5.1)
Sodium: 127 mmol/L — ABNORMAL LOW (ref 135–145)

## 2020-06-28 LAB — CBG MONITORING, ED
Glucose-Capillary: >600 mg/dL (ref 70–99)
Glucose-Capillary: 508 mg/dL (ref 70–99)
Glucose-Capillary: 345 mg/dL — ABNORMAL HIGH (ref 70–99)

## 2020-06-28 LAB — CBC
HCT: 39.7 % (ref 36.0–46.0)
Hemoglobin: 14 g/dL (ref 12.0–15.0)
MCH: 32.8 pg (ref 26.0–34.0)
MCHC: 35.3 g/dL (ref 30.0–36.0)
MCV: 93 fL (ref 80.0–100.0)
Platelets: 257 10*3/uL (ref 150–400)
RBC: 4.27 MIL/uL (ref 3.87–5.11)
RDW: 12.5 % (ref 11.5–15.5)
WBC: 4 10*3/uL (ref 4.0–10.5)
nRBC: 0 % (ref 0.0–0.2)

## 2020-06-28 MED ORDER — SODIUM CHLORIDE 0.9 % IV BOLUS
1000.0000 mL | Freq: Once | INTRAVENOUS | Status: AC
  Administered 2020-06-28: 1000 mL via INTRAVENOUS

## 2020-06-28 MED ORDER — MORPHINE SULFATE (PF) 4 MG/ML IV SOLN
4.0000 mg | Freq: Once | INTRAVENOUS | Status: AC
Start: 2020-06-28 — End: 2020-06-28
  Administered 2020-06-28: 4 mg via INTRAVENOUS
  Filled 2020-06-28: qty 1

## 2020-06-28 MED ORDER — SODIUM CHLORIDE 0.9 % IV SOLN
1000.0000 mL | Freq: Once | INTRAVENOUS | Status: AC
  Administered 2020-06-28: 1000 mL via INTRAVENOUS

## 2020-06-28 MED ORDER — ONDANSETRON HCL 4 MG/2ML IJ SOLN
4.0000 mg | Freq: Once | INTRAMUSCULAR | Status: AC
  Administered 2020-06-28: 4 mg via INTRAVENOUS
  Filled 2020-06-28: qty 2

## 2020-06-28 MED ORDER — INSULIN ASPART 100 UNIT/ML IJ SOLN
10.0000 [IU] | Freq: Once | INTRAMUSCULAR | Status: AC
  Administered 2020-06-28: 10 [IU] via INTRAVENOUS
  Filled 2020-06-28: qty 1

## 2020-06-28 NOTE — Discharge Instructions (Addendum)
Please be sure to check your blood sugar level frequently and keep good control of your blood level. Please seek medical attention for any high fevers, chest pain, shortness of breath, change in behavior, persistent vomiting, bloody stool or any other new or concerning symptoms.

## 2020-06-28 NOTE — ED Provider Notes (Signed)
[facility] Emergency Department Provider Note   ____________________________________________    I have reviewed the triage vital signs and the nursing notes.   HISTORY  Chief Complaint Hyperglycemia     HPI Erica Mercer is a 65 y.o. female with history of bipolar disorder, CAD, diabetes, hypertension who presents with complaints of elevated glucose as well as some epigastric discomfort.  Patient has a history of similar visits in the past, she appears to have compliance issues with her diabetes medications.  She denies nausea or vomiting.  No fevers or chills.  Epigastric cramping similar to prior episodes for which she has had a normal work-up in the past.  Suspect gastroparesis  Past Medical History:  Diagnosis Date  . Bipolar 1 disorder ([place])   . CAD (coronary artery disease)   . Diabetes mellitus without complication ([place])   . HLD (hyperlipidemia)   . HTN (hypertension)     Patient Active Problem List   Diagnosis Date Noted  . Stage 3a chronic kidney disease ([place])   . CAD (coronary artery disease) 05/05/2020  . Hyperosmolar hyperglycemic state (HHS) ([place]) 05/05/2020  . Abdominal pain 05/05/2020  . GERD (gastroesophageal reflux disease) 05/05/2020  . Tobacco abuse 05/05/2020  . Left arm numbness 05/05/2020  . Depression 05/05/2020  . Hyperglycemia   . AKI (acute kidney injury) ([place]) 04/30/2020  . 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[place]) 04/30/2020  . Severe sepsis with acute organ dysfunction ([place]) 04/30/2020  . DKA, type 1 ([place]) 02/26/2020  . Acute renal failure ([place])   . DKA (diabetic ketoacidosis) ([place]) 02/25/2020  . Malnutrition ([place]) 02/25/2020  . Essential hypertension 02/25/2020  . HLD (hyperlipidemia) 02/25/2020  . Soft tissue mass 02/25/2020  . H/O cocaine abuse ([place]) 02/17/2018  . Bipolar 1 disorder ([place]) 04/22/2016  . Old myocardial infarction 04/22/2016  . COPD (chronic obstructive pulmonary disease) ([employer])  01/22/2016    Past Surgical History:  Procedure Laterality Date  . COLONOSCOPY    . ESOPHAGOGASTRODUODENOSCOPY      Prior to Admission medications   Medication Sig Start Date End Date Taking? Authorizing Provider  albuterol (VENTOLIN HFA) 108 (90 Base) MCG/ACT inhaler Inhale 2 puffs into the lungs every 6 (six) hours as needed for wheezing. 05/07/20 05/02/21  Loletha Grayer, MD  aluminum-magnesium hydroxide-simethicone (MAALOX) 200-200-20 MG/5ML SUSP Take 30 mLs by mouth 4 (four) times daily -  before meals and at bedtime. 06/16/20   Carrie Mew, MD  amLODipine (NORVASC) 10 MG tablet Take 1 tablet (10 mg total) by mouth daily for 3 days. 06/03/20 06/06/20  Naaman Plummer, MD  atorvastatin (LIPITOR) 40 MG tablet Take 1 tablet (40 mg total) by mouth at bedtime. 05/07/20 05/02/21  Loletha Grayer, MD  famotidine (PEPCID) 20 MG tablet Take 1 tablet (20 mg total) by mouth 2 (two) times daily. 06/16/20   Carrie Mew, MD  insulin glargine (LANTUS SOLOSTAR) 100 UNIT/ML Solostar Pen Inject 25 Units into the skin daily. 05/07/20 05/02/21  Loletha Grayer, MD  Insulin Pen Needle 32G X 4 MM MISC 1 Dose by Does not apply route 4 (four) times daily -  before meals and at bedtime. 05/07/20   Loletha Grayer, MD  losartan (COZAAR) 50 MG tablet Take 1 tablet (50 mg total) by mouth daily for 3 days. 06/03/20 06/06/20  Naaman Plummer, MD  metoCLOPramide (REGLAN) 10 MG tablet Take 1 tablet (10 mg total) by mouth every 6 (six) hours as needed. 06/16/20   Carrie Mew, MD  mirtazapine (REMERON) 30 MG tablet Take 1 tablet (30 mg total) by mouth at bedtime. 05/07/20   Loletha Grayer, MD  nicotine polacrilex (NICORETTE) 4 MG gum Take by mouth. Patient not taking: No sig reported 01/24/20   [provider]  omeprazole (PRILOSEC) 40 MG capsule Take 1 capsule (40 mg total) by mouth daily for 3 days. 06/03/20 06/06/20  Naaman Plummer, MD  ondansetron (ZOFRAN ODT) 4 MG disintegrating tablet Take 1 tablet  (4 mg total) by mouth every 6 (six) hours as needed for nausea or vomiting. 05/22/20   Enzo Bi, MD  polyethylene glycol (MIRALAX / GLYCOLAX) 17 g packet Take 34 g by mouth daily. 05/22/20   Enzo Bi, MD  pregabalin (LYRICA) 50 MG capsule Take 1 capsule (50 mg total) by mouth 2 (two) times daily for 3 days. 06/03/20 06/06/20  Naaman Plummer, MD  REXULTI 1 MG TABS tablet Take 1 mg by mouth every morning. 03/11/20   [provider]  sucralfate (CARAFATE) 1 GM/10ML suspension Take 10 mLs (1 g total) by mouth 4 (four) times daily. 06/03/20 06/03/21  Naaman Plummer, MD     Allergies Patient has no known allergies.  Family History  Problem Relation Age of Onset  . Heart disease Mother   . Prostate cancer Father     Social History Social History   Tobacco Use  . Smoking status: Current Every Day Smoker  . Smokeless tobacco: Never Used  Vaping Use  . Vaping Use: Never used  Substance Use Topics  . Alcohol use: Not Currently  . Drug use: Yes    Frequency: 3.0 times per week    Types: Marijuana    Review of Systems  Constitutional: No fever/chills Eyes: No visual changes.  ENT: No sore throat. Cardiovascular: Denies chest pain. Respiratory: Denies shortness of breath. Gastrointestinal: As above Genitourinary: Negative for dysuria. Musculoskeletal: Negative for back pain. Skin: Negative for rash. Neurological: Negative for headaches or weakness   ____________________________________________   PHYSICAL EXAM:  VITAL SIGNS: ED Triage Vitals  Enc Vitals Group     BP 06/28/20 1333 (!) 154/97     Pulse Rate 06/28/20 1333 87     Resp 06/28/20 1333 20     Temp 06/28/20 1333 98.3 F (36.8 C)     Temp Source 06/28/20 1333 Oral     SpO2 06/28/20 1333 100 %     Weight 06/28/20 1334 54.4 kg (120 lb)     Height 06/28/20 1334 1.575 m (5\' 2" )     Head Circumference --      Peak Flow --      Pain Score 06/28/20 1334 7     Pain Loc --      Pain Edu? --      Excl. in [place]? --      Constitutional: Alert and oriented.   Nose: No congestion/rhinnorhea. Mouth/Throat: Mucous membranes are moist.   Neck:  Painless ROM Cardiovascular: Normal rate, regular rhythm.  Good peripheral circulation. Respiratory: Normal respiratory effort.  No retractions. Lungs CTAB. Gastrointestinal: Soft and nontender. No distention.  No CVA tenderness.  Musculoskeletal: No lower extremity tenderness nor edema.  Warm and well perfused Neurologic:  Normal speech and language. No gross focal neurologic deficits are appreciated.  Skin:  Skin is warm, dry and intact. No rash noted. Psychiatric: Mood and affect are normal. Speech and behavior are normal.  ____________________________________________   LABS (all labs ordered are listed, but only abnormal results are displayed)  Labs  Reviewed  BASIC METABOLIC PANEL - Abnormal; Notable for the following components:      Result Value   Sodium 127 (*)    Potassium 5.4 (*)    Chloride 87 (*)    Glucose, Bld 740 (*)    BUN 51 (*)    Creatinine, Ser 1.89 (*)    GFR, Estimated 29 (*)    All other components within normal limits  BLOOD GAS, VENOUS - Abnormal; Notable for the following components:   Bicarbonate 30.8 (*)    Acid-Base Excess 3.5 (*)    All other components within normal limits  CBG MONITORING, ED - Abnormal; Notable for the following components:   Glucose-Capillary >600 (*)    All other components within normal limits  CBC  URINALYSIS, COMPLETE (UACMP) WITH MICROSCOPIC  CBG MONITORING, ED   ____________________________________________  EKG  ED ECG REPORT I, Lavonia Drafts, the attending physician, personally viewed and interpreted this ECG.  Date: 06/28/2020  Rhythm: normal sinus rhythm QRS Axis: normal Intervals: normal ST/T Wave abnormalities: normal Narrative Interpretation: no evidence of acute  ischemia  ____________________________________________  RADIOLOGY  None ____________________________________________   PROCEDURES  Procedure(s) performed: No  Procedures   Critical Care performed: No ____________________________________________   INITIAL IMPRESSION / ASSESSMENT AND PLAN / ED COURSE  Pertinent labs & imaging results that were available during my care of the patient were reviewed by me and considered in my medical decision making (see chart for details).  Patient presents with hyperglycemia and epigastric discomfort, exam is overall reassuring, no significant abdominal tenderness palpation  Lab work notable for glucose of 740, mild elevation of potassium noted, elevated BUN to creatinine ratio  VBG is normal, bicarb is normal, no evidence of DKA.  Will treat with IV fluids, IV insulin, will give IV morphine and Zofran for chronic abdominal pain.  Have asked Dr. Archie Balboa to reevaluate glucose, anticipate discharge    ____________________________________________   FINAL CLINICAL IMPRESSION(S) / ED DIAGNOSES  Final diagnoses:  Hyperglycemia        Note:  This document was prepared using Dragon voice recognition software and may include unintentional dictation errors.   Lavonia Drafts, MD 06/28/20 [phone]

## 2020-06-28 NOTE — ED Notes (Signed)
Dr Archie Balboa aware of pt CBG- pt given meal tray

## 2020-06-28 NOTE — ED Triage Notes (Signed)
Pt arrives via EMS from home for HA, high BP, and high CBG- pt told EMS she has not eaten much in the last 4 days- pt states she fell and hit her head and that is why she has a HA- pt has also been having abdominal pain and back pain

## 2020-06-30 LAB — BLOOD GAS, VENOUS
Acid-Base Excess: 3.5 mmol/L — ABNORMAL HIGH (ref 0.0–2.0)
Bicarbonate: 30.8 mmol/L — ABNORMAL HIGH (ref 20.0–28.0)
O2 Saturation: 43.2 %
Patient temperature: 37
pCO2, Ven: 57 mmHg (ref 44.0–60.0)
pH, Ven: 7.34 (ref 7.250–7.430)

## 2020-07-18 ENCOUNTER — Encounter: Payer: Self-pay | Admitting: Emergency Medicine

## 2020-07-18 ENCOUNTER — Emergency Department
Admission: EM | Admit: 2020-07-18 | Discharge: 2020-07-18 | Disposition: A | Discharge status: Home / Self Care | Payer: Medicare Other | Attending: Emergency Medicine | Admitting: Emergency Medicine

## 2020-07-18 ENCOUNTER — Other Ambulatory Visit: Payer: Self-pay

## 2020-07-18 ENCOUNTER — Emergency Department: Payer: Medicare Other

## 2020-07-18 DIAGNOSIS — I2510 Atherosclerotic heart disease of native coronary artery without angina pectoris: Secondary | ICD-10-CM | POA: Insufficient documentation

## 2020-07-18 DIAGNOSIS — E1022 Type 1 diabetes mellitus with diabetic chronic kidney disease: Secondary | ICD-10-CM | POA: Diagnosis not present

## 2020-07-18 DIAGNOSIS — N1831 Chronic kidney disease, stage 3a: Secondary | ICD-10-CM | POA: Insufficient documentation

## 2020-07-18 DIAGNOSIS — Z794 Long term (current) use of insulin: Secondary | ICD-10-CM | POA: Insufficient documentation

## 2020-07-18 DIAGNOSIS — R739 Hyperglycemia, unspecified: Secondary | ICD-10-CM

## 2020-07-18 DIAGNOSIS — J449 Chronic obstructive pulmonary disease, unspecified: Secondary | ICD-10-CM | POA: Diagnosis not present

## 2020-07-18 DIAGNOSIS — E1065 Type 1 diabetes mellitus with hyperglycemia: Secondary | ICD-10-CM | POA: Insufficient documentation

## 2020-07-18 DIAGNOSIS — R519 Headache, unspecified: Secondary | ICD-10-CM | POA: Diagnosis present

## 2020-07-18 DIAGNOSIS — Z79899 Other long term (current) drug therapy: Secondary | ICD-10-CM | POA: Diagnosis not present

## 2020-07-18 DIAGNOSIS — F17200 Nicotine dependence, unspecified, uncomplicated: Secondary | ICD-10-CM | POA: Insufficient documentation

## 2020-07-18 DIAGNOSIS — I129 Hypertensive chronic kidney disease with stage 1 through stage 4 chronic kidney disease, or unspecified chronic kidney disease: Secondary | ICD-10-CM | POA: Diagnosis not present

## 2020-07-18 LAB — COMPREHENSIVE METABOLIC PANEL
ALT: 28 U/L (ref 0–44)
AST: 27 U/L (ref 15–41)
Albumin: 3.1 g/dL — ABNORMAL LOW (ref 3.5–5.0)
Alkaline Phosphatase: 144 U/L — ABNORMAL HIGH (ref 38–126)
Anion gap: 7 (ref 5–15)
BUN: 23 mg/dL (ref 8–23)
CO2: 30 mmol/L (ref 22–32)
Calcium: 9.3 mg/dL (ref 8.9–10.3)
Chloride: 99 mmol/L (ref 98–111)
Creatinine, Ser: 0.97 mg/dL (ref 0.44–1.00)
GFR, Estimated: >60 mL/min (ref >60)
Glucose, Bld: 388 mg/dL — ABNORMAL HIGH (ref 70–99)
Potassium: 4.5 mmol/L (ref 3.5–5.1)
Sodium: 136 mmol/L (ref 135–145)
Total Bilirubin: 1.1 mg/dL (ref 0.3–1.2)
Total Protein: 6.4 g/dL — ABNORMAL LOW (ref 6.5–8.1)

## 2020-07-18 LAB — LIPASE, BLOOD: Lipase: 24 U/L (ref 11–51)

## 2020-07-18 LAB — CBC WITH DIFFERENTIAL/PLATELET
Abs Immature Granulocytes: 0.03 10*3/uL (ref 0.00–0.07)
Basophils Absolute: 0 10*3/uL (ref 0.0–0.1)
Basophils Relative: 1 %
Eosinophils Absolute: 0.1 10*3/uL (ref 0.0–0.5)
Eosinophils Relative: 1 %
HCT: 35 % — ABNORMAL LOW (ref 36.0–46.0)
Hemoglobin: 11.7 g/dL — ABNORMAL LOW (ref 12.0–15.0)
Immature Granulocytes: 1 %
Lymphocytes Relative: 35 %
Lymphs Abs: 2.3 10*3/uL (ref 0.7–4.0)
MCH: 32.5 pg (ref 26.0–34.0)
MCHC: 33.4 g/dL (ref 30.0–36.0)
MCV: 97.2 fL (ref 80.0–100.0)
Monocytes Absolute: 0.4 10*3/uL (ref 0.1–1.0)
Monocytes Relative: 6 %
Neutro Abs: 3.7 10*3/uL (ref 1.7–7.7)
Neutrophils Relative %: 56 %
Platelets: 272 10*3/uL (ref 150–400)
RBC: 3.6 MIL/uL — ABNORMAL LOW (ref 3.87–5.11)
RDW: 13.2 % (ref 11.5–15.5)
WBC: 6.5 10*3/uL (ref 4.0–10.5)
nRBC: 0 % (ref 0.0–0.2)

## 2020-07-18 LAB — CBG MONITORING, ED: Glucose-Capillary: 346 mg/dL — ABNORMAL HIGH (ref 70–99)

## 2020-07-18 MED ORDER — SODIUM CHLORIDE 0.9 % IV BOLUS
1000.0000 mL | Freq: Once | INTRAVENOUS | Status: AC
  Administered 2020-07-18: 1000 mL via INTRAVENOUS

## 2020-07-18 MED ORDER — AMLODIPINE BESYLATE 5 MG PO TABS: 10.0000 mg | ORAL_TABLET | Freq: Once | ORAL | Status: DC

## 2020-07-18 MED ORDER — DIPHENHYDRAMINE HCL 50 MG/ML IJ SOLN
25.0000 mg | Freq: Once | INTRAMUSCULAR | Status: AC
  Administered 2020-07-18: 25 mg via INTRAVENOUS
  Filled 2020-07-18: qty 1

## 2020-07-18 MED ORDER — PROCHLORPERAZINE EDISYLATE 10 MG/2ML IJ SOLN
10.0000 mg | Freq: Once | INTRAMUSCULAR | Status: AC
  Administered 2020-07-18: 10 mg via INTRAVENOUS
  Filled 2020-07-18: qty 2

## 2020-07-18 MED ORDER — LABETALOL HCL 5 MG/ML IV SOLN: 10.0000 mg | Freq: Once | INTRAVENOUS | Status: DC

## 2020-07-18 NOTE — ED Notes (Signed)
Patient discharged to home per MD order. Patient in stable condition, and deemed medically cleared by ED provider for discharge. Discharge instructions reviewed with patient/family using "Teach Back"; verbalized understanding of medication education and administration, and information about follow-up care. Denies further concerns. ° °

## 2020-07-18 NOTE — ED Notes (Signed)
Meds given for headache rates it 8/10 at this time.

## 2020-07-18 NOTE — ED Notes (Signed)
Report received from [facility]. Patient care assumed. Patient/RN introduction complete. Will continue to monitor.

## 2020-07-18 NOTE — ED Notes (Signed)
Patient provided socks.

## 2020-07-18 NOTE — ED Provider Notes (Signed)
[facility] Emergency Department Provider Note   ____________________________________________   Event Date/Time   First MD Initiated Contact with Patient 07/18/20 0222     (approximate)  I have reviewed the triage vital signs and the nursing notes.   HISTORY  Chief Complaint Headache    HPI Erica Mercer is a 65 y.o. female with past medical history of hypertension, hyperlipidemia, diabetes, CAD, CKD, and COPD who presents to the ED for headache.  Patient reports 2 days of gradually worsening diffuse throbbing headache.  She states that occurred shortly after she stood up and hit her head on her dresser.  She felt dizzy after hitting her head, but denies losing consciousness, states she does not take any blood thinners.  She denies any associated numbness or weakness, does states she has felt nauseous with a couple episodes of vomiting.  She denies any history of similar headaches and has not taken anything for her headache prior to arrival.        Past Medical History:  Diagnosis Date   Bipolar 1 disorder ([place])    CAD (coronary artery disease)    Diabetes mellitus without complication ([place])    HLD (hyperlipidemia)    HTN (hypertension)     Patient Active Problem List   Diagnosis Date Noted   Stage 3a chronic kidney disease ([place])    CAD (coronary artery disease) 05/05/2020   Hyperosmolar hyperglycemic state (HHS) ([place]) 05/05/2020   Abdominal pain 05/05/2020   GERD (gastroesophageal reflux disease) 05/05/2020   Tobacco abuse 05/05/2020   Left arm numbness 05/05/2020   Depression 05/05/2020   Hyperglycemia    AKI (acute kidney injury) ([place]) 04/30/2020   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[place]) 04/30/2020   Severe sepsis with acute organ dysfunction ([place]) 04/30/2020   DKA, type 1 ([place]) 02/26/2020   Acute renal failure ([place])    DKA (diabetic ketoacidosis) ([place]) 02/25/2020   Malnutrition ([place]) 02/25/2020   Essential hypertension  02/25/2020   HLD (hyperlipidemia) 02/25/2020   Soft tissue mass 02/25/2020   H/O cocaine abuse ([place]) 02/17/2018   Bipolar 1 disorder ([place]) 04/22/2016   Old myocardial infarction 04/22/2016   COPD (chronic obstructive pulmonary disease) ([place]) 01/22/2016    Past Surgical History:  Procedure Laterality Date   COLONOSCOPY     ESOPHAGOGASTRODUODENOSCOPY      Prior to Admission medications   Medication Sig Start Date End Date Taking? Authorizing Provider  albuterol (VENTOLIN HFA) 108 (90 Base) MCG/ACT inhaler Inhale 2 puffs into the lungs every 6 (six) hours as needed for wheezing. 05/07/20 05/02/21  Loletha Grayer, MD  aluminum-magnesium hydroxide-simethicone (MAALOX) 200-200-20 MG/5ML SUSP Take 30 mLs by mouth 4 (four) times daily -  before meals and at bedtime. 06/16/20   Carrie Mew, MD  amLODipine (NORVASC) 10 MG tablet Take 1 tablet (10 mg total) by mouth daily for 3 days. 06/03/20 06/06/20  Naaman Plummer, MD  atorvastatin (LIPITOR) 40 MG tablet Take 1 tablet (40 mg total) by mouth at bedtime. 05/07/20 05/02/21  Loletha Grayer, MD  famotidine (PEPCID) 20 MG tablet Take 1 tablet (20 mg total) by mouth 2 (two) times daily. 06/16/20   Carrie Mew, MD  insulin glargine (LANTUS SOLOSTAR) 100 UNIT/ML Solostar Pen Inject 25 Units into the skin daily. 05/07/20 05/02/21  Loletha Grayer, MD  Insulin Pen Needle 32G X 4 MM MISC 1 Dose by Does not apply route 4 (four) times daily -  before meals and at bedtime. 05/07/20   Wieting, Richard,  MD  losartan (COZAAR) 50 MG tablet Take 1 tablet (50 mg total) by mouth daily for 3 days. 06/03/20 06/06/20  Naaman Plummer, MD  metoCLOPramide (REGLAN) 10 MG tablet Take 1 tablet (10 mg total) by mouth every 6 (six) hours as needed. 06/16/20   Carrie Mew, MD  mirtazapine (REMERON) 30 MG tablet Take 1 tablet (30 mg total) by mouth at bedtime. 05/07/20   Loletha Grayer, MD  nicotine polacrilex (NICORETTE) 4 MG gum Take by mouth. Patient not taking: No sig  reported 01/24/20   [provider]  omeprazole (PRILOSEC) 40 MG capsule Take 1 capsule (40 mg total) by mouth daily for 3 days. 06/03/20 06/06/20  Naaman Plummer, MD  ondansetron (ZOFRAN ODT) 4 MG disintegrating tablet Take 1 tablet (4 mg total) by mouth every 6 (six) hours as needed for nausea or vomiting. 05/22/20   Enzo Bi, MD  polyethylene glycol (MIRALAX / GLYCOLAX) 17 g packet Take 34 g by mouth daily. 05/22/20   Enzo Bi, MD  pregabalin (LYRICA) 50 MG capsule Take 1 capsule (50 mg total) by mouth 2 (two) times daily for 3 days. 06/03/20 06/06/20  Naaman Plummer, MD  REXULTI 1 MG TABS tablet Take 1 mg by mouth every morning. 03/11/20   [provider]  sucralfate (CARAFATE) 1 GM/10ML suspension Take 10 mLs (1 g total) by mouth 4 (four) times daily. 06/03/20 06/03/21  Naaman Plummer, MD    Allergies Patient has no known allergies.  Family History  Problem Relation Age of Onset   Heart disease Mother    Prostate cancer Father     Social History Social History   Tobacco Use   Smoking status: Every Day    Pack years: 0.00   Smokeless tobacco: Never  Vaping Use   Vaping Use: Never used  Substance Use Topics   Alcohol use: Not Currently   Drug use: Yes    Frequency: 3.0 times per week    Types: Marijuana    Review of Systems  Constitutional: No fever/chills Eyes: No visual changes. ENT: No sore throat. Cardiovascular: Denies chest pain. Respiratory: Denies shortness of breath. Gastrointestinal: No abdominal pain.  Positive for nausea and vomiting.  No diarrhea.  No constipation. Genitourinary: Negative for dysuria. Musculoskeletal: Negative for back pain. Skin: Negative for rash. Neurological: Positive for headaches, negative for focal weakness or numbness.  ____________________________________________   PHYSICAL EXAM:  VITAL SIGNS: ED Triage Vitals  Enc Vitals Group     BP 07/18/20 0032 (!) 175/100     Pulse Rate 07/18/20 0032 79     Resp  07/18/20 0032 18     Temp 07/18/20 0032 98.4 F (36.9 C)     Temp Source 07/18/20 0032 Oral     SpO2 07/18/20 0030 99 %     Weight 07/18/20 0033 105 lb (47.6 kg)     Height 07/18/20 0033 5\' 2"  (1.575 m)     Head Circumference --      Peak Flow --      Pain Score 07/18/20 0032 7     Pain Loc --      Pain Edu? --      Excl. in [place]? --     Constitutional: Alert and oriented. Eyes: Conjunctivae are normal.  Pupils equal round and reactive to light bilaterally. Head: Atraumatic. Nose: No congestion/rhinnorhea. Mouth/Throat: Mucous membranes are moist. Neck: Normal ROM Cardiovascular: Normal rate, regular rhythm. Grossly normal heart sounds.  2+ radial pulses bilaterally. Respiratory:  Normal respiratory effort.  No retractions. Lungs CTAB. Gastrointestinal: Soft and nontender. No distention. Genitourinary: deferred Musculoskeletal: No lower extremity tenderness nor edema. Neurologic:  Normal speech and language. No gross focal neurologic deficits are appreciated. Skin:  Skin is warm, dry and intact. No rash noted. Psychiatric: Mood and affect are normal. Speech and behavior are normal.  ____________________________________________   LABS (all labs ordered are listed, but only abnormal results are displayed)  Labs Reviewed  CBC WITH DIFFERENTIAL/PLATELET - Abnormal; Notable for the following components:      Result Value   RBC 3.60 (*)    Hemoglobin 11.7 (*)    HCT 35.0 (*)    All other components within normal limits  COMPREHENSIVE METABOLIC PANEL - Abnormal; Notable for the following components:   Glucose, Bld 388 (*)    Total Protein 6.4 (*)    Albumin 3.1 (*)    Alkaline Phosphatase 144 (*)    All other components within normal limits  CBG MONITORING, ED - Abnormal; Notable for the following components:   Glucose-Capillary 346 (*)    All other components within normal limits  LIPASE, BLOOD    PROCEDURES  Procedure(s) performed (including Critical  Care):  Procedures   ____________________________________________   INITIAL IMPRESSION / ASSESSMENT AND PLAN / ED COURSE      65 year old female with past medical history of hypertension, hyperlipidemia, diabetes, CAD, CKD, and COPD who presents to the ED with 2 days of gradually worsening headache after hitting her head on her dresser.  Patient with no focal neurologic deficits on exam, low suspicion for [facility] but we will CT head given her reported trauma.  No fevers or meningismus noted.  Patient noted to be hypertensive and has a long history of medication noncompliance.  We will screen labs and give dose of labetalol as well as her usual amlodipine for BP control.  BP improved following treatment for her headache but prior to administration of labetalol and amlodipine.  CT head is negative for acute process and patient now states headache is improved, nausea also improved with no vomiting here in the ED.  Patient is appropriate for discharge home with PCP follow-up for recheck of her blood sugar and blood pressure.  She was counseled to return to the ED for new worsening symptoms, patient agrees with plan.      ____________________________________________   FINAL CLINICAL IMPRESSION(S) / ED DIAGNOSES  Final diagnoses:  Acute nonintractable headache, unspecified headache type  Hyperglycemia     ED Discharge Orders     None        Note:  This document was prepared using Dragon voice recognition software and may include unintentional dictation errors.    Blake Divine, MD 07/18/20 0630

## 2020-07-18 NOTE — ED Notes (Signed)
Patient ambulatory in room. Patient reports headache, and nausea x 2 days. Patient reports hx of HTN. Patient respirations even and unlabored. NADN.

## 2020-07-18 NOTE — ED Notes (Signed)
Pt to CT scan.

## 2020-07-18 NOTE — ED Notes (Signed)
Pt noted to be sleeping comfortably without any evidence of distress noted at this time.

## 2020-07-18 NOTE — ED Triage Notes (Signed)
EMS brings pt in from home for c/o generalized HA since yesterday accomp by nausea

## 2020-07-30 ENCOUNTER — Emergency Department: Payer: Medicare Other

## 2020-07-30 ENCOUNTER — Other Ambulatory Visit: Payer: Self-pay

## 2020-07-30 ENCOUNTER — Inpatient Hospital Stay: Payer: Medicare Other

## 2020-07-30 ENCOUNTER — Inpatient Hospital Stay
Admission: EM | Admit: 2020-07-30 | Discharge: 2020-08-03 | DRG: 637 | Disposition: A | Discharge status: Home / Self Care | Payer: Medicare Other | Attending: Internal Medicine | Admitting: Internal Medicine

## 2020-07-30 DIAGNOSIS — I25118 Atherosclerotic heart disease of native coronary artery with other forms of angina pectoris: Secondary | ICD-10-CM | POA: Diagnosis not present

## 2020-07-30 DIAGNOSIS — R509 Fever, unspecified: Secondary | ICD-10-CM | POA: Diagnosis present

## 2020-07-30 DIAGNOSIS — I129 Hypertensive chronic kidney disease with stage 1 through stage 4 chronic kidney disease, or unspecified chronic kidney disease: Secondary | ICD-10-CM | POA: Diagnosis present

## 2020-07-30 DIAGNOSIS — K920 Hematemesis: Secondary | ICD-10-CM | POA: Diagnosis present

## 2020-07-30 DIAGNOSIS — O8604 Sepsis following an obstetrical procedure: Secondary | ICD-10-CM

## 2020-07-30 DIAGNOSIS — I248 Other forms of acute ischemic heart disease: Secondary | ICD-10-CM | POA: Diagnosis present

## 2020-07-30 DIAGNOSIS — I214 Non-ST elevation (NSTEMI) myocardial infarction: Secondary | ICD-10-CM | POA: Diagnosis present

## 2020-07-30 DIAGNOSIS — E785 Hyperlipidemia, unspecified: Secondary | ICD-10-CM | POA: Diagnosis present

## 2020-07-30 DIAGNOSIS — E860 Dehydration: Secondary | ICD-10-CM | POA: Diagnosis present

## 2020-07-30 DIAGNOSIS — Z8249 Family history of ischemic heart disease and other diseases of the circulatory system: Secondary | ICD-10-CM | POA: Diagnosis not present

## 2020-07-30 DIAGNOSIS — Z681 Body mass index (BMI) 19 or less, adult: Secondary | ICD-10-CM | POA: Diagnosis not present

## 2020-07-30 DIAGNOSIS — I2510 Atherosclerotic heart disease of native coronary artery without angina pectoris: Secondary | ICD-10-CM | POA: Diagnosis present

## 2020-07-30 DIAGNOSIS — N179 Acute kidney failure, unspecified: Secondary | ICD-10-CM | POA: Diagnosis not present

## 2020-07-30 DIAGNOSIS — E875 Hyperkalemia: Secondary | ICD-10-CM | POA: Diagnosis not present

## 2020-07-30 DIAGNOSIS — Z794 Long term (current) use of insulin: Secondary | ICD-10-CM | POA: Diagnosis not present

## 2020-07-30 DIAGNOSIS — E11649 Type 2 diabetes mellitus with hypoglycemia without coma: Secondary | ICD-10-CM

## 2020-07-30 DIAGNOSIS — G9341 Metabolic encephalopathy: Secondary | ICD-10-CM | POA: Diagnosis present

## 2020-07-30 DIAGNOSIS — I160 Hypertensive urgency: Secondary | ICD-10-CM | POA: Diagnosis present

## 2020-07-30 DIAGNOSIS — R636 Underweight: Secondary | ICD-10-CM | POA: Diagnosis present

## 2020-07-30 DIAGNOSIS — R9431 Abnormal electrocardiogram [ECG] [EKG]: Secondary | ICD-10-CM | POA: Diagnosis not present

## 2020-07-30 DIAGNOSIS — Z87891 Personal history of nicotine dependence: Secondary | ICD-10-CM

## 2020-07-30 DIAGNOSIS — R531 Weakness: Secondary | ICD-10-CM | POA: Diagnosis not present

## 2020-07-30 DIAGNOSIS — E1165 Type 2 diabetes mellitus with hyperglycemia: Secondary | ICD-10-CM

## 2020-07-30 DIAGNOSIS — L659 Nonscarring hair loss, unspecified: Secondary | ICD-10-CM | POA: Diagnosis present

## 2020-07-30 DIAGNOSIS — I10 Essential (primary) hypertension: Secondary | ICD-10-CM

## 2020-07-30 DIAGNOSIS — R000 Tachycardia, unspecified: Secondary | ICD-10-CM | POA: Diagnosis present

## 2020-07-30 DIAGNOSIS — E872 Acidosis, unspecified: Secondary | ICD-10-CM

## 2020-07-30 DIAGNOSIS — E0810 Diabetes mellitus due to underlying condition with ketoacidosis without coma: Secondary | ICD-10-CM | POA: Diagnosis not present

## 2020-07-30 DIAGNOSIS — K922 Gastrointestinal hemorrhage, unspecified: Secondary | ICD-10-CM

## 2020-07-30 DIAGNOSIS — N189 Chronic kidney disease, unspecified: Secondary | ICD-10-CM | POA: Diagnosis not present

## 2020-07-30 DIAGNOSIS — Z20822 Contact with and (suspected) exposure to covid-19: Secondary | ICD-10-CM | POA: Diagnosis present

## 2020-07-30 DIAGNOSIS — Z8042 Family history of malignant neoplasm of prostate: Secondary | ICD-10-CM

## 2020-07-30 DIAGNOSIS — R778 Other specified abnormalities of plasma proteins: Secondary | ICD-10-CM | POA: Diagnosis not present

## 2020-07-30 DIAGNOSIS — E871 Hypo-osmolality and hyponatremia: Secondary | ICD-10-CM | POA: Diagnosis not present

## 2020-07-30 DIAGNOSIS — N1831 Chronic kidney disease, stage 3a: Secondary | ICD-10-CM | POA: Diagnosis present

## 2020-07-30 DIAGNOSIS — G039 Meningitis, unspecified: Secondary | ICD-10-CM | POA: Diagnosis present

## 2020-07-30 DIAGNOSIS — E1011 Type 1 diabetes mellitus with ketoacidosis with coma: Secondary | ICD-10-CM | POA: Diagnosis not present

## 2020-07-30 DIAGNOSIS — F319 Bipolar disorder, unspecified: Secondary | ICD-10-CM | POA: Diagnosis present

## 2020-07-30 DIAGNOSIS — E1311 Other specified diabetes mellitus with ketoacidosis with coma: Secondary | ICD-10-CM | POA: Diagnosis not present

## 2020-07-30 DIAGNOSIS — E1100 Type 2 diabetes mellitus with hyperosmolarity without nonketotic hyperglycemic-hyperosmolar coma (NKHHC): Secondary | ICD-10-CM

## 2020-07-30 DIAGNOSIS — Z66 Do not resuscitate: Secondary | ICD-10-CM | POA: Diagnosis present

## 2020-07-30 DIAGNOSIS — Z79899 Other long term (current) drug therapy: Secondary | ICD-10-CM | POA: Diagnosis not present

## 2020-07-30 DIAGNOSIS — F1290 Cannabis use, unspecified, uncomplicated: Secondary | ICD-10-CM | POA: Diagnosis present

## 2020-07-30 DIAGNOSIS — Z720 Tobacco use: Secondary | ICD-10-CM | POA: Diagnosis present

## 2020-07-30 DIAGNOSIS — E1110 Type 2 diabetes mellitus with ketoacidosis without coma: Secondary | ICD-10-CM | POA: Diagnosis not present

## 2020-07-30 DIAGNOSIS — A419 Sepsis, unspecified organism: Secondary | ICD-10-CM | POA: Diagnosis present

## 2020-07-30 LAB — CBG MONITORING, ED
Glucose-Capillary: >600 mg/dL (ref 70–99)
Glucose-Capillary: >600 mg/dL (ref 70–99)
Glucose-Capillary: >600 mg/dL (ref 70–99)
Glucose-Capillary: >600 mg/dL (ref 70–99)
Glucose-Capillary: 581 mg/dL (ref 70–99)
Glucose-Capillary: >600 mg/dL (ref 70–99)
Glucose-Capillary: 525 mg/dL (ref 70–99)
Glucose-Capillary: 314 mg/dL — ABNORMAL HIGH (ref 70–99)
Glucose-Capillary: 281 mg/dL — ABNORMAL HIGH (ref 70–99)
Glucose-Capillary: 282 mg/dL — ABNORMAL HIGH (ref 70–99)
Glucose-Capillary: 211 mg/dL — ABNORMAL HIGH (ref 70–99)

## 2020-07-30 LAB — CBC WITH DIFFERENTIAL/PLATELET
Abs Immature Granulocytes: 0.03 10*3/uL (ref 0.00–0.07)
Basophils Absolute: 0 10*3/uL (ref 0.0–0.1)
Basophils Relative: 0 %
Eosinophils Absolute: 0 10*3/uL (ref 0.0–0.5)
Eosinophils Relative: 0 %
HCT: 38.1 % (ref 36.0–46.0)
Hemoglobin: 12.5 g/dL (ref 12.0–15.0)
Immature Granulocytes: 0 %
Lymphocytes Relative: 8 %
Lymphs Abs: 0.6 10*3/uL — ABNORMAL LOW (ref 0.7–4.0)
MCH: 32.6 pg (ref 26.0–34.0)
MCHC: 32.8 g/dL (ref 30.0–36.0)
MCV: 99.5 fL (ref 80.0–100.0)
Monocytes Absolute: 0.2 10*3/uL (ref 0.1–1.0)
Monocytes Relative: 3 %
Neutro Abs: 5.9 10*3/uL (ref 1.7–7.7)
Neutrophils Relative %: 89 %
Platelets: 302 10*3/uL (ref 150–400)
RBC: 3.83 MIL/uL — ABNORMAL LOW (ref 3.87–5.11)
RDW: 13.1 % (ref 11.5–15.5)
WBC: 6.7 10*3/uL (ref 4.0–10.5)
nRBC: 0 % (ref 0.0–0.2)

## 2020-07-30 LAB — COMPREHENSIVE METABOLIC PANEL
ALT: 40 U/L (ref 0–44)
AST: 87 U/L — ABNORMAL HIGH (ref 15–41)
Albumin: 3.9 g/dL (ref 3.5–5.0)
Alkaline Phosphatase: 193 U/L — ABNORMAL HIGH (ref 38–126)
Anion gap: 21 — ABNORMAL HIGH (ref 5–15)
BUN: 55 mg/dL — ABNORMAL HIGH (ref 8–23)
CO2: 21 mmol/L — ABNORMAL LOW (ref 22–32)
Calcium: 9.8 mg/dL (ref 8.9–10.3)
Chloride: 87 mmol/L — ABNORMAL LOW (ref 98–111)
Creatinine, Ser: 2.09 mg/dL — ABNORMAL HIGH (ref 0.44–1.00)
GFR, Estimated: 26 mL/min — ABNORMAL LOW (ref >60)
Glucose, Bld: 1188 mg/dL (ref 70–99)
Potassium: 5.9 mmol/L — ABNORMAL HIGH (ref 3.5–5.1)
Sodium: 129 mmol/L — ABNORMAL LOW (ref 135–145)
Total Bilirubin: 1.6 mg/dL — ABNORMAL HIGH (ref 0.3–1.2)
Total Protein: 7.3 g/dL (ref 6.5–8.1)

## 2020-07-30 LAB — PROTIME-INR
INR: 1.1 (ref 0.8–1.2)
Prothrombin Time: 14.5 seconds (ref 11.4–15.2)

## 2020-07-30 LAB — BASIC METABOLIC PANEL
Anion gap: 15 (ref 5–15)
Anion gap: 11 (ref 5–15)
BUN: 45 mg/dL — ABNORMAL HIGH (ref 8–23)
BUN: 44 mg/dL — ABNORMAL HIGH (ref 8–23)
CO2: 18 mmol/L — ABNORMAL LOW (ref 22–32)
CO2: 21 mmol/L — ABNORMAL LOW (ref 22–32)
Calcium: 8.8 mg/dL — ABNORMAL LOW (ref 8.9–10.3)
Calcium: 8.7 mg/dL — ABNORMAL LOW (ref 8.9–10.3)
Chloride: 102 mmol/L (ref 98–111)
Chloride: 109 mmol/L (ref 98–111)
Creatinine, Ser: 1.76 mg/dL — ABNORMAL HIGH (ref 0.44–1.00)
Creatinine, Ser: 1.61 mg/dL — ABNORMAL HIGH (ref 0.44–1.00)
GFR, Estimated: 32 mL/min — ABNORMAL LOW (ref >60)
GFR, Estimated: 35 mL/min — ABNORMAL LOW (ref >60)
Glucose, Bld: 657 mg/dL (ref 70–99)
Glucose, Bld: 305 mg/dL — ABNORMAL HIGH (ref 70–99)
Potassium: 4.9 mmol/L (ref 3.5–5.1)
Potassium: 4.4 mmol/L (ref 3.5–5.1)
Sodium: 135 mmol/L (ref 135–145)
Sodium: 141 mmol/L (ref 135–145)

## 2020-07-30 LAB — CBC
HCT: 29.9 % — ABNORMAL LOW (ref 36.0–46.0)
Hemoglobin: 10.5 g/dL — ABNORMAL LOW (ref 12.0–15.0)
MCH: 33.3 pg (ref 26.0–34.0)
MCHC: 35.1 g/dL (ref 30.0–36.0)
MCV: 94.9 fL (ref 80.0–100.0)
Platelets: 251 10*3/uL (ref 150–400)
RBC: 3.15 MIL/uL — ABNORMAL LOW (ref 3.87–5.11)
RDW: 12.7 % (ref 11.5–15.5)
WBC: 12.2 10*3/uL — ABNORMAL HIGH (ref 4.0–10.5)
nRBC: 0 % (ref 0.0–0.2)

## 2020-07-30 LAB — BETA-HYDROXYBUTYRIC ACID: Beta-Hydroxybutyric Acid: 2.32 mmol/L — ABNORMAL HIGH (ref 0.05–0.27)

## 2020-07-30 LAB — LACTIC ACID, PLASMA
Lactic Acid, Venous: 6.9 mmol/L (ref 0.5–1.9)
Lactic Acid, Venous: 3.1 mmol/L (ref 0.5–1.9)
Lactic Acid, Venous: 2.2 mmol/L (ref 0.5–1.9)

## 2020-07-30 LAB — URINALYSIS, ROUTINE W REFLEX MICROSCOPIC
Bilirubin Urine: NEGATIVE
Glucose, UA: >=500 mg/dL — AB
Ketones, ur: 5 mg/dL — AB
Leukocytes,Ua: NEGATIVE
Nitrite: NEGATIVE
Protein, ur: 100 mg/dL — AB
Specific Gravity, Urine: 1.02 (ref 1.005–1.030)
Squamous Epithelial / HPF: NONE SEEN (ref 0–5)
pH: 5 (ref 5.0–8.0)

## 2020-07-30 LAB — BLOOD GAS, ARTERIAL
Acid-base deficit: 3.3 mmol/L — ABNORMAL HIGH (ref 0.0–2.0)
Bicarbonate: 21.3 mmol/L (ref 20.0–28.0)
FIO2: 0.28
O2 Saturation: 98.2 %
Patient temperature: 37
pCO2 arterial: 36 mmHg (ref 32.0–48.0)
pH, Arterial: 7.38 (ref 7.350–7.450)
pO2, Arterial: 109 mmHg — ABNORMAL HIGH (ref 83.0–108.0)

## 2020-07-30 LAB — LIPASE, BLOOD: Lipase: 32 U/L (ref 11–51)

## 2020-07-30 LAB — RESP PANEL BY RT-PCR (FLU A&B, COVID) ARPGX2
Influenza A by PCR: NEGATIVE
Influenza B by PCR: NEGATIVE
SARS Coronavirus 2 by RT PCR: NEGATIVE

## 2020-07-30 LAB — TROPONIN I (HIGH SENSITIVITY)
Troponin I (High Sensitivity): 91 ng/L — ABNORMAL HIGH (ref <18)
Troponin I (High Sensitivity): 792 ng/L (ref <18)
Troponin I (High Sensitivity): 1520 ng/L (ref <18)

## 2020-07-30 LAB — APTT: aPTT: >200 seconds (ref 24–36)

## 2020-07-30 LAB — POC SARS CORONAVIRUS 2 AG -  ED: SARSCOV2ONAVIRUS 2 AG: NEGATIVE

## 2020-07-30 LAB — GLUCOSE, CAPILLARY
Glucose-Capillary: 128 mg/dL — ABNORMAL HIGH (ref 70–99)
Glucose-Capillary: 97 mg/dL (ref 70–99)

## 2020-07-30 LAB — MRSA NEXT GEN BY PCR, NASAL: MRSA by PCR Next Gen: NOT DETECTED

## 2020-07-30 MED ORDER — LORAZEPAM 2 MG/ML IJ SOLN: 1.0000 mg | Freq: Once | INTRAMUSCULAR | Status: AC

## 2020-07-30 MED ORDER — SODIUM CHLORIDE 0.9 % IV SOLN
2.0000 g | INTRAVENOUS | Status: DC
  Filled 2020-07-30 (×6): qty 2000

## 2020-07-30 MED ORDER — HYDRALAZINE HCL 20 MG/ML IJ SOLN
10.0000 mg | Freq: Four times a day (QID) | INTRAMUSCULAR | Status: DC | PRN
  Administered 2020-07-30: 10 mg via INTRAVENOUS
  Filled 2020-07-30: qty 1

## 2020-07-30 MED ORDER — DEXTROSE 50 % IV SOLN
0.0000 mL | INTRAVENOUS | Status: DC | PRN
  Administered 2020-08-01: 50 mL via INTRAVENOUS
  Filled 2020-07-30: qty 50

## 2020-07-30 MED ORDER — INSULIN REGULAR(HUMAN) IN NACL 100-0.9 UT/100ML-% IV SOLN
INTRAVENOUS | Status: DC
  Administered 2020-07-30: 7 [IU]/h via INTRAVENOUS
  Filled 2020-07-30: qty 100

## 2020-07-30 MED ORDER — INSULIN GLARGINE 100 UNIT/ML ~~LOC~~ SOLN
25.0000 [IU] | SUBCUTANEOUS | Status: DC
  Administered 2020-07-30: 25 [IU] via SUBCUTANEOUS
  Filled 2020-07-30 (×2): qty 0.25

## 2020-07-30 MED ORDER — MORPHINE SULFATE (PF) 2 MG/ML IV SOLN
2.0000 mg | Freq: Once | INTRAVENOUS | Status: AC
  Administered 2020-07-30: 2 mg via INTRAVENOUS

## 2020-07-30 MED ORDER — VANCOMYCIN VARIABLE DOSE PER UNSTABLE RENAL FUNCTION (PHARMACIST DOSING): Status: DC

## 2020-07-30 MED ORDER — VANCOMYCIN HCL IN DEXTROSE 1-5 GM/200ML-% IV SOLN: 1000.0000 mg | Freq: Once | INTRAVENOUS | Status: DC

## 2020-07-30 MED ORDER — HALOPERIDOL LACTATE 5 MG/ML IJ SOLN
2.0000 mg | Freq: Once | INTRAMUSCULAR | Status: AC
  Administered 2020-07-30: 2 mg via INTRAMUSCULAR
  Filled 2020-07-30: qty 1

## 2020-07-30 MED ORDER — MORPHINE SULFATE (PF) 2 MG/ML IV SOLN
INTRAVENOUS | Status: AC
  Filled 2020-07-30: qty 1

## 2020-07-30 MED ORDER — SODIUM CHLORIDE 0.9 % IV SOLN
2.0000 g | Freq: Two times a day (BID) | INTRAVENOUS | Status: DC
  Administered 2020-07-30 – 2020-07-31 (×2): 2 g via INTRAVENOUS
  Filled 2020-07-30 (×3): qty 20
  Filled 2020-07-30: qty 2

## 2020-07-30 MED ORDER — MORPHINE SULFATE (PF) 2 MG/ML IV SOLN
2.0000 mg | Freq: Once | INTRAVENOUS | Status: AC
Start: 2020-07-30 — End: 2020-07-30
  Administered 2020-07-30: 2 mg via INTRAVENOUS
  Filled 2020-07-30: qty 1

## 2020-07-30 MED ORDER — SODIUM CHLORIDE 0.9 % IV SOLN
2.0000 g | Freq: Once | INTRAVENOUS | Status: AC
  Administered 2020-07-30: 2 g via INTRAVENOUS
  Filled 2020-07-30: qty 2

## 2020-07-30 MED ORDER — SODIUM CHLORIDE 0.9 % IV BOLUS
1000.0000 mL | Freq: Once | INTRAVENOUS | Status: AC
  Administered 2020-07-30: 1000 mL via INTRAVENOUS

## 2020-07-30 MED ORDER — ONDANSETRON HCL 4 MG/2ML IJ SOLN
4.0000 mg | Freq: Once | INTRAMUSCULAR | Status: AC
  Administered 2020-07-30: 4 mg via INTRAVENOUS
  Filled 2020-07-30: qty 2

## 2020-07-30 MED ORDER — METRONIDAZOLE 500 MG/100ML IV SOLN
500.0000 mg | Freq: Once | INTRAVENOUS | Status: AC
  Administered 2020-07-30: 500 mg via INTRAVENOUS
  Filled 2020-07-30: qty 100

## 2020-07-30 MED ORDER — MORPHINE SULFATE (PF) 2 MG/ML IV SOLN: 2.0000 mg | Freq: Once | INTRAVENOUS | Status: DC

## 2020-07-30 MED ORDER — VANCOMYCIN HCL 1250 MG/250ML IV SOLN
1250.0000 mg | Freq: Once | INTRAVENOUS | Status: AC
  Administered 2020-07-30: 1250 mg via INTRAVENOUS
  Filled 2020-07-30: qty 250

## 2020-07-30 MED ORDER — SODIUM CHLORIDE 0.9 % IV BOLUS (SEPSIS)
1000.0000 mL | Freq: Once | INTRAVENOUS | Status: AC
  Administered 2020-07-30: 1000 mL via INTRAVENOUS

## 2020-07-30 MED ORDER — SODIUM CHLORIDE 0.9 % IV SOLN
2.0000 g | Freq: Three times a day (TID) | INTRAVENOUS | Status: DC
  Administered 2020-07-31 (×2): 2 g via INTRAVENOUS
  Filled 2020-07-30: qty 2
  Filled 2020-07-30 (×4): qty 2000

## 2020-07-30 MED ORDER — HEPARIN BOLUS VIA INFUSION
3400.0000 [IU] | Freq: Once | INTRAVENOUS | Status: AC
  Administered 2020-07-30: 3400 [IU] via INTRAVENOUS
  Filled 2020-07-30: qty 3400

## 2020-07-30 MED ORDER — SODIUM CHLORIDE 0.9 % IV SOLN
2.0000 g | Freq: Four times a day (QID) | INTRAVENOUS | Status: DC
  Administered 2020-07-30: 2 g via INTRAVENOUS
  Filled 2020-07-30 (×4): qty 2000

## 2020-07-30 MED ORDER — CHLORHEXIDINE GLUCONATE CLOTH 2 % EX PADS
6.0000 | MEDICATED_PAD | Freq: Every day | CUTANEOUS | Status: DC
  Administered 2020-07-30 – 2020-08-01 (×3): 6 via TOPICAL

## 2020-07-30 MED ORDER — INSULIN ASPART 100 UNIT/ML IJ SOLN
0.0000 [IU] | Freq: Three times a day (TID) | INTRAMUSCULAR | Status: DC
  Administered 2020-07-31 (×2): 3 [IU] via SUBCUTANEOUS
  Administered 2020-08-01: 2 [IU] via SUBCUTANEOUS
  Administered 2020-08-01: 8 [IU] via SUBCUTANEOUS
  Administered 2020-08-02: 3 [IU] via SUBCUTANEOUS
  Administered 2020-08-02: 15 [IU] via SUBCUTANEOUS
  Administered 2020-08-03: 5 [IU] via SUBCUTANEOUS
  Administered 2020-08-03: 3 [IU] via SUBCUTANEOUS
  Filled 2020-07-30 (×8): qty 1

## 2020-07-30 MED ORDER — LORAZEPAM 2 MG/ML IJ SOLN
INTRAMUSCULAR | Status: AC
  Administered 2020-07-30: 1 mg via INTRAMUSCULAR
  Filled 2020-07-30: qty 1

## 2020-07-30 MED ORDER — DEXTROSE IN LACTATED RINGERS 5 % IV SOLN: INTRAVENOUS | Status: DC

## 2020-07-30 MED ORDER — LACTATED RINGERS IV SOLN: INTRAVENOUS | Status: AC

## 2020-07-30 MED ORDER — LORAZEPAM 2 MG/ML IJ SOLN
1.0000 mg | Freq: Once | INTRAMUSCULAR | Status: AC
  Administered 2020-07-30: 1 mg via INTRAVENOUS
  Filled 2020-07-30: qty 1

## 2020-07-30 MED ORDER — NITROGLYCERIN 2 % TD OINT
0.5000 [in_us] | TOPICAL_OINTMENT | Freq: Once | TRANSDERMAL | Status: AC
  Administered 2020-07-30: 0.5 [in_us] via TOPICAL
  Filled 2020-07-30: qty 1

## 2020-07-30 MED ORDER — PANTOPRAZOLE SODIUM 40 MG IV SOLR
40.0000 mg | Freq: Two times a day (BID) | INTRAVENOUS | Status: DC
  Administered 2020-07-30 – 2020-08-01 (×4): 40 mg via INTRAVENOUS
  Filled 2020-07-30 (×4): qty 40

## 2020-07-30 MED ORDER — ORAL CARE MOUTH RINSE
15.0000 mL | Freq: Two times a day (BID) | OROMUCOSAL | Status: DC
  Administered 2020-07-30 – 2020-08-03 (×7): 15 mL via OROMUCOSAL

## 2020-07-30 MED ORDER — ASPIRIN 300 MG RE SUPP
300.0000 mg | Freq: Once | RECTAL | Status: AC
  Administered 2020-07-30: 300 mg via RECTAL
  Filled 2020-07-30: qty 1

## 2020-07-30 MED ORDER — INSULIN REGULAR(HUMAN) IN NACL 100-0.9 UT/100ML-% IV SOLN: INTRAVENOUS | Status: DC

## 2020-07-30 MED ORDER — SODIUM CHLORIDE 0.9 % IV BOLUS (SEPSIS)
500.0000 mL | Freq: Once | INTRAVENOUS | Status: AC
  Administered 2020-07-30: 500 mL via INTRAVENOUS

## 2020-07-30 MED ORDER — HEPARIN (PORCINE) 25000 UT/250ML-% IV SOLN
550.0000 [IU]/h | INTRAVENOUS | Status: AC
  Administered 2020-07-30: 700 [IU]/h via INTRAVENOUS
  Administered 2020-08-01: 550 [IU]/h via INTRAVENOUS
  Filled 2020-07-30 (×2): qty 250

## 2020-07-30 MED ORDER — PANTOPRAZOLE SODIUM 40 MG IV SOLR
40.0000 mg | Freq: Once | INTRAVENOUS | Status: AC
  Administered 2020-07-30: 40 mg via INTRAVENOUS
  Filled 2020-07-30: qty 40

## 2020-07-30 MED ORDER — ENOXAPARIN SODIUM 30 MG/0.3ML IJ SOSY: 30.0000 mg | PREFILLED_SYRINGE | INTRAMUSCULAR | Status: DC

## 2020-07-30 MED ORDER — LORAZEPAM 2 MG/ML IJ SOLN
INTRAMUSCULAR | Status: AC
  Administered 2020-07-30: 1 mg via INTRAVENOUS
  Filled 2020-07-30: qty 1

## 2020-07-30 MED ORDER — LACTATED RINGERS IV BOLUS
1000.0000 mL | Freq: Once | INTRAVENOUS | Status: AC
  Administered 2020-07-30: 1000 mL via INTRAVENOUS

## 2020-07-30 MED ORDER — ACETAMINOPHEN 650 MG RE SUPP
650.0000 mg | Freq: Once | RECTAL | Status: AC
  Administered 2020-07-30: 650 mg via RECTAL
  Filled 2020-07-30: qty 1

## 2020-07-30 MED ORDER — SODIUM CHLORIDE 0.9 % IV BOLUS (SEPSIS)
250.0000 mL | Freq: Once | INTRAVENOUS | Status: AC
  Administered 2020-07-30: 250 mL via INTRAVENOUS

## 2020-07-30 NOTE — Consult Note (Signed)
CODE SEPSIS - PHARMACY COMMUNICATION  **Broad Spectrum Antibiotics should be administered within 1 hour of Sepsis diagnosis**  Time Code Sepsis Called/Page Received: 1541  Antibiotics Ordered: 1545  Time of 1st antibiotic administration: 1605  Additional action taken by pharmacy: N/A  If necessary, Name of Provider/Nurse Contacted: N/A  Lorna Dibble ,PharmD Clinical Pharmacist  07/30/2020  3:43 PM

## 2020-07-30 NOTE — Sepsis Progress Note (Signed)
Notified provider of need to order lactic acid. ° °

## 2020-07-30 NOTE — H&P (Addendum)
History and Physical    Erica Mercer WUJ:[id number] DOB: 1955/09/24 DOA: 07/30/2020  PCP: [name], NP   Patient coming from: Home  I have personally briefly reviewed patient's old medical records in [place]  Chief Complaint: Change in mental status   Patient is unable to provide any history at this time most of the history is obtained from the EMR.  HPI: Erica Mercer is a 65 y.o. female with medical history significant for diabetes mellitus, coronary artery disease, bipolar disorder, hypertension who was brought into the ER by EMS for evaluation of diffuse abdominal pain and vomiting. The ER physician said patient had emesis containing blood.  She was noted to be agitated and unable to stay still or follow commands.  Fingerstick was greater than 600g/dl. Patient received a dose of IM Haldol and 2 doses of Ativan, 1 IV and another IM. She feels lethargic and withdraws from painful stimuli during my evaluation. I am unable to do review of systems on this patient. Labs show sodium 129, potassium 5.9, chloride 87, bicarb 21, glucose 1000 188, BUN 55, creatinine 2.09 compared to baseline of 0.97, calcium 9.8, anion gap 21, alkaline phosphatase 193, albumin 3.9, lipase 32, AST 87, ALT 40, total protein 7.3, total bilirubin 1.6, white count 6.7, hemoglobin 12.5, hematocrit 38.1, MCV 99.5, RDW 13.1, platelet count 302 Twelve-lead EKG shows sinus tachycardia. CT scan of the head without contrast shows no evidence of acute intracranial abnormality. Mild-to-moderate chronic small vessel ischemic disease. CT scan of abdomen and pelvis shows distended bladder.  Nonobstructing small renal calculi.  Calcified uterine fibroids.  Right femoral head AVN.  Aortic atherosclerosis.   ED Course: Patient is a 65 year old female who presents to the emergency room via EMS for evaluation of abdominal pain and blood-tinged emesis.  Patient was said to be very confused and acting erratic in  the ER and so she received 2 doses of Ativan and 1 dose of Haldol. She is unable to participate in this history and physical due to mental status changes. Labs reveal hyperglycemia with anion gap metabolic acidosis, she also has hypokalemia and hyponatremia. Foley catheter was inserted in the ER for urinary retention. She will be admitted to the hospital for further evaluation.     Review of Systems: As per HPI otherwise all other systems reviewed and negative.    Past Medical History:  Diagnosis Date   Bipolar 1 disorder ([place])    CAD (coronary artery disease)    Diabetes mellitus without complication ([place])    HLD (hyperlipidemia)    HTN (hypertension)     Past Surgical History:  Procedure Laterality Date   COLONOSCOPY     ESOPHAGOGASTRODUODENOSCOPY       reports that she has been smoking. She has never used smokeless tobacco. She reports previous alcohol use. She reports current drug use. Frequency: 3.00 times per week. Drug: Marijuana.  No Known Allergies  Family History  Problem Relation Age of Onset   Heart disease Mother    Prostate cancer Father       Prior to Admission medications   Medication Sig Start Date End Date Taking? Authorizing Provider  albuterol (VENTOLIN HFA) 108 (90 Base) MCG/ACT inhaler Inhale 2 puffs into the lungs every 6 (six) hours as needed for wheezing. 05/07/20 05/02/21  Loletha Grayer, MD  aluminum-magnesium hydroxide-simethicone (MAALOX) 200-200-20 MG/5ML SUSP Take 30 mLs by mouth 4 (four) times daily -  before meals and at bedtime. 06/16/20   Carrie Mew, MD  amLODipine (NORVASC) 10 MG tablet Take 1 tablet (10 mg total) by mouth daily for 3 days. 06/03/20 06/06/20  Naaman Plummer, MD  atorvastatin (LIPITOR) 40 MG tablet Take 1 tablet (40 mg total) by mouth at bedtime. 05/07/20 05/02/21  Loletha Grayer, MD  famotidine (PEPCID) 20 MG tablet Take 1 tablet (20 mg total) by mouth 2 (two) times daily. 06/16/20   Carrie Mew, MD  insulin  glargine (LANTUS SOLOSTAR) 100 UNIT/ML Solostar Pen Inject 25 Units into the skin daily. 05/07/20 05/02/21  Loletha Grayer, MD  Insulin Pen Needle 32G X 4 MM MISC 1 Dose by Does not apply route 4 (four) times daily -  before meals and at bedtime. 05/07/20   Loletha Grayer, MD  losartan (COZAAR) 50 MG tablet Take 1 tablet (50 mg total) by mouth daily for 3 days. 06/03/20 06/06/20  Naaman Plummer, MD  metoCLOPramide (REGLAN) 10 MG tablet Take 1 tablet (10 mg total) by mouth every 6 (six) hours as needed. 06/16/20   Carrie Mew, MD  mirtazapine (REMERON) 30 MG tablet Take 1 tablet (30 mg total) by mouth at bedtime. 05/07/20   Loletha Grayer, MD  nicotine polacrilex (NICORETTE) 4 MG gum Take by mouth. Patient not taking: No sig reported 01/24/20   [provider]  omeprazole (PRILOSEC) 40 MG capsule Take 1 capsule (40 mg total) by mouth daily for 3 days. 06/03/20 06/06/20  Naaman Plummer, MD  ondansetron (ZOFRAN ODT) 4 MG disintegrating tablet Take 1 tablet (4 mg total) by mouth every 6 (six) hours as needed for nausea or vomiting. 05/22/20   Enzo Bi, MD  polyethylene glycol (MIRALAX / GLYCOLAX) 17 g packet Take 34 g by mouth daily. 05/22/20   Enzo Bi, MD  pregabalin (LYRICA) 50 MG capsule Take 1 capsule (50 mg total) by mouth 2 (two) times daily for 3 days. 06/03/20 06/06/20  Naaman Plummer, MD  REXULTI 1 MG TABS tablet Take 1 mg by mouth every morning. 03/11/20   [provider]  sucralfate (CARAFATE) 1 GM/10ML suspension Take 10 mLs (1 g total) by mouth 4 (four) times daily. 06/03/20 06/03/21  Naaman Plummer, MD    Physical Exam: Vitals:   07/30/20 1630 07/30/20 1645 07/30/20 1700 07/30/20 1715  BP: (!) 199/94  (!) 153/95   Pulse: (!) 128 (!) 128 (!) 129 (!) 126  Resp: 19 20 (!) 29 (!) 23  Temp: (!) 103.4 F (39.7 C) (!) 103.4 F (39.7 C) (!) 103.1 F (39.5 C) (!) 102.8 F (39.3 C)  TempSrc:      SpO2: 100% 100% 100% 100%  Weight:      Height:         Vitals:    07/30/20 1630 07/30/20 1645 07/30/20 1700 07/30/20 1715  BP: (!) 199/94  (!) 153/95   Pulse: (!) 128 (!) 128 (!) 129 (!) 126  Resp: 19 20 (!) 29 (!) 23  Temp: (!) 103.4 F (39.7 C) (!) 103.4 F (39.7 C) (!) 103.1 F (39.5 C) (!) 102.8 F (39.3 C)  TempSrc:      SpO2: 100% 100% 100% 100%  Weight:      Height:          Constitutional: Lethargic but withdraws from painful stimuli.  Chronically ill-appearing, thin HEENT:      Head: Normocephalic and atraumatic.         Eyes: PERLA, EOMI, Conjunctivae are normal. Sclera is non-icteric.       Mouth/Throat: Mucous membranes are  dry       Neck: Supple with no signs of meningismus. Cardiovascular: Tachycardic. No murmurs, gallops, or rubs. 2+ symmetrical distal pulses are present . No JVD. No LE edema Respiratory: Respiratory effort normal .Lungs sounds clear bilaterally. No wheezes, crackles, or rhonchi.  Gastrointestinal: Soft, non tender, and non distended with positive bowel sounds.  Genitourinary: No CVA tenderness.  Foley catheter in place Musculoskeletal: Nontender with normal range of motion in all extremities. No cyanosis, or erythema of extremities. Neurologic:  Face is symmetric. Moving all extremities. No gross focal neurologic deficits . Skin: Skin is warm, dry.  No rash or ulcers Psychiatric: Mood and affect are normal    Labs on Admission: I have personally reviewed following labs and imaging studies  CBC: Recent Labs  Lab 07/30/20 1336  WBC 6.7  NEUTROABS 5.9  HGB 12.5  HCT 38.1  MCV 99.5  PLT 462   Basic Metabolic Panel: Recent Labs  Lab 07/30/20 1336 07/30/20 1617  NA 129* 135  K 5.9* 4.9  CL 87* 102  CO2 21* 18*  GLUCOSE 1,188* 657*  BUN 55* 45*  CREATININE 2.09* 1.76*  CALCIUM 9.8 8.8*   GFR: Estimated Creatinine Clearance: 25.2 mL/min (A) (by C-G formula based on SCr of 1.76 mg/dL (H)). Liver Function Tests: Recent Labs  Lab 07/30/20 1336  AST 87*  ALT 40  ALKPHOS 193*  BILITOT 1.6*   PROT 7.3  ALBUMIN 3.9   Recent Labs  Lab 07/30/20 1336  LIPASE 32   No results for input(s): AMMONIA in the last 168 hours. Coagulation Profile: No results for input(s): INR, PROTIME in the last 168 hours. Cardiac Enzymes: No results for input(s): CKTOTAL, CKMB, CKMBINDEX, TROPONINI in the last 168 hours. BNP (last 3 results) No results for input(s): PROBNP in the last 8760 hours. HbA1C: No results for input(s): HGBA1C in the last 72 hours. CBG: Recent Labs  Lab 07/30/20 1432 07/30/20 1503 07/30/20 1530 07/30/20 1609 07/30/20 1638  GLUCAP >600* >600* >600* 581* 525*   Lipid Profile: No results for input(s): CHOL, HDL, LDLCALC, TRIG, CHOLHDL, LDLDIRECT in the last 72 hours. Thyroid Function Tests: No results for input(s): TSH, T4TOTAL, FREET4, T3FREE, THYROIDAB in the last 72 hours. Anemia Panel: No results for input(s): VITAMINB12, FOLATE, FERRITIN, TIBC, IRON, RETICCTPCT in the last 72 hours. Urine analysis:    Component Value Date/Time   COLORURINE STRAW (A) 07/30/2020 1259   APPEARANCEUR HAZY (A) 07/30/2020 1259   LABSPEC 1.020 07/30/2020 1259   PHURINE 5.0 07/30/2020 1259   GLUCOSEU >=500 (A) 07/30/2020 1259   HGBUR SMALL (A) 07/30/2020 1259   BILIRUBINUR NEGATIVE 07/30/2020 1259   KETONESUR 5 (A) 07/30/2020 1259   PROTEINUR 100 (A) 07/30/2020 1259   NITRITE NEGATIVE 07/30/2020 1259   LEUKOCYTESUR NEGATIVE 07/30/2020 1259    Radiological Exams on Admission: CT ABDOMEN PELVIS WO CONTRAST  Result Date: 07/30/2020 CLINICAL DATA:  Abdominal pain EXAM: CT ABDOMEN AND PELVIS WITHOUT CONTRAST TECHNIQUE: Multidetector CT imaging of the abdomen and pelvis was performed following the standard protocol without IV contrast. COMPARISON:  05/21/2020 FINDINGS: Motion and streak artifact are present. Findings below are within this limitation. Lower chest: No acute abnormality. Hepatobiliary: No focal liver abnormality is seen. No gallstones, gallbladder wall thickening, or  biliary dilatation. Pancreas: Unremarkable. Spleen: Unremarkable. Adrenals/Urinary Tract: Small bilateral nonobstructing renal calculi as before. Largest measures 4 mm on the right. Extrarenal pelvis on the left. No hydronephrosis. Bladder is distended. Stomach/Bowel: Stomach is unremarkable. Bowel is normal in caliber.  Stool is present throughout the colon. Vascular/Lymphatic: Aortic atherosclerosis. No enlarged lymph nodes are identified. Reproductive: Calcified uterine fibroids. Other: No free fluid.  No acute abnormality of the abdominal wall. Musculoskeletal: No acute osseous abnormality. Right femoral head avascular necrosis. IMPRESSION: Distended bladder. Nonobstructing small renal calculi. Calcified uterine fibroids. Aortic atherosclerosis. Right femoral head AVN. Electronically Signed   By: Macy Mis M.D.   On: 07/30/2020 15:37   CT Head Wo Contrast  Result Date: 07/30/2020 CLINICAL DATA:  Mental status change. Vomiting and headache. Hyperglycemia. EXAM: CT HEAD WITHOUT CONTRAST TECHNIQUE: Contiguous axial images were obtained from the base of the skull through the vertex without intravenous contrast. COMPARISON:  Head CT 07/18/2020 and MRI 05/05/2020 FINDINGS: Brain: There is no evidence of an acute infarct, intracranial hemorrhage, mass, midline shift, or extra-axial fluid collection. The ventricles and sulci are within normal limits for age. Patchy hypodensities in the cerebral white matter bilaterally are unchanged and nonspecific but compatible with mild-to-moderate chronic small vessel ischemic disease. Vascular: Calcified atherosclerosis at the skull base. No hyperdense vessel. Skull: No fracture or suspicious osseous lesion. Sinuses/Orbits: Visualized paranasal sinuses and mastoid air cells are clear. Visualized orbits are unremarkable. Other: None. IMPRESSION: 1. No evidence of acute intracranial abnormality. 2. Mild-to-moderate chronic small vessel ischemic disease. Electronically Signed    By: Logan Bores M.D.   On: 07/30/2020 15:31     Assessment/Plan Principal Problem:   DKA (diabetic ketoacidosis) ([place]) Active Problems:   HTN (hypertension)   CAD (coronary artery disease)   Tobacco abuse   Hyperkalemia   Hyponatremia   Acute metabolic encephalopathy   Hypertensive urgency   Hematemesis of unknown etiology   Meningitis    Diabetic ketoacidosis In a patient with a history of diabetes mellitus Serum glucose was greater than 1000 and anion gap was 21 Continue aggressive IV fluid resuscitation Continue insulin drip per protocol Check and supplement electrolytes as needed Patient will require diabetic education once mental status improves     Acute metabolic encephalopathy Most likely secondary to hyperglycemia, AKI as well as hyponatremia Patient was unable to follow commands when she arrived in the ER and was very erratic and agitated and received Ativan and Haldol. Expect improvement in patient's mental status following resolution of acute illness.      Acute kidney injury Secondary to osmotic diuresis from hyperglycemia At baseline patient has a serum creatinine of 0.97 and today on admission her serum creatinine is 2.09 Continue IV fluid hydration Repeat renal parameters in a.m.    History of coronary artery disease Will obtain serial cardiac enzymes to rule out an acute coronary syndrome     Hematemesis Unclear etiology and may be secondary to Mallory-Weiss tear from emesis Monitor serial H&H Place patient on IV Protonix Consult GI if patient has a drop in her hemoglobin    Hypertensive urgency Will place patient on IV hydralazine for systolic blood pressure greater than 162mmHg Patient unable to take her oral antihypertensive medications at this time due to mental status changes     Hyperkalemia Secondary to metabolic acidosis Expect improvement with IV insulin and IV fluid hydration     Hyponatremia Secondary to  hyperglycemia Expect improvement in patient's sodium levels following resolution of hypoglycemia     Presumed meningitis In a patient who presents for evaluation of mental status changes and spiked a fever in the ER with a T-max of 103 ER physician attempted lumbar puncture without Will place patient empirically on Rocephin, vancomycin adjusted to renal  function and ampicillin for presumed meningitis Follow-up results of blood cultures Place patient on airborne precautions   DVT prophylaxis: SCD  Code Status: full code  Family Communication: None Consults called: none  Status: At the time of admission, it appears that the appropriate admission status for this patient is inpatient. This is judged to be reasonable and necessary in order to provide the required intensity of service to ensure the patient's safety given the presenting symptoms, physical exam findings and initial radiographic and laboratory data in the context of their comorbid conditions. Patient requires inpatient status due to high intensity of service, high risk for further deterioration and high frequency of surveillance required.    Collier Bullock MD [facility]     07/30/2020, 5:17 PM

## 2020-07-30 NOTE — ED Notes (Signed)
Verbal order for 1mg  of ativan given by EDP Malinda at bedside for LP procedure.

## 2020-07-30 NOTE — Consult Note (Signed)
ANTICOAGULATION CONSULT NOTE - Initial Consult  Pharmacy Consult for heparin Indication: chest pain/ACS  No Known Allergies  Patient Measurements: Height: 5\' 2"  (157.5 cm) Weight: 56.7 kg (125 lb) IBW/kg (Calculated) : 50.1 Heparin Dosing Weight: 56.7 kg  Vital Signs: Temp: 102.5 F (39.2 C) (07/06 1800) Temp Source: Oral (07/06 1226) BP: 141/77 (07/06 1800) Pulse Rate: 118 (07/06 1800)  Labs: Recent Labs    07/30/20 1336 07/30/20 1538 07/30/20 1617 07/30/20 1746 07/30/20 1840  HGB 12.5  --   --   --  10.5*  HCT 38.1  --   --   --  29.9*  PLT 302  --   --   --  251  CREATININE 2.09*  --  1.76*  --   --   TROPONINIHS  --  91*  --  792*  --     Estimated Creatinine Clearance: 25.2 mL/min (A) (by C-G formula based on SCr of 1.76 mg/dL (H)).   Medical History: Past Medical History:  Diagnosis Date   Bipolar 1 disorder ([place])    CAD (coronary artery disease)    Diabetes mellitus without complication ([place])    HLD (hyperlipidemia)    HTN (hypertension)     Medications:  No PTA anticoagulation or antiplatelet therapy   Assessment:  65 y.o. female with medical history significant for T1DM, CAD, bipolar disorder, and HTN who was brought into the ER by EMS for evaluation of diffuse abdominal pain and vomiting. Pt was found to have troponin elevated to 792. Pharmacy has been consulted for heparin dosing for ACS.  Hgb: 10.5; plts: 251 Heparin Dosing Weight: 56.7 kg  Goal of Therapy:  Heparin level 0.3-0.7 units/ml Monitor platelets by anticoagulation protocol: Yes   Plan:  Give 3400 units bolus x 1 Start heparin infusion at 700 units/hr Check anti-Xa level in 8 hours and daily while on heparin Continue to monitor H&H and platelets  Darnelle Bos, PharmD 07/30/2020,6:56 PM

## 2020-07-30 NOTE — ED Notes (Signed)
Report given at this time.

## 2020-07-30 NOTE — Sepsis Progress Note (Signed)
Notified provider of need to order repeat lactic acid in 2 hours.

## 2020-07-30 NOTE — Sepsis Progress Note (Signed)
Elink tracking the Code Sepsis. 

## 2020-07-30 NOTE — ED Provider Notes (Addendum)
No  [facility] Emergency Department Provider Note   ____________________________________________   Event Date/Time   First MD Initiated Contact with Patient 07/30/20 1144     (approximate)  I have reviewed the triage vital signs and the nursing notes.   HISTORY  Chief Complaint Emesis    HPI Erica Mercer is a 65 y.o. female patient comes in complaining of abdominal pain and vomiting.  She is vomiting bloody material.  She is answering my questions but really does not want to follow commands well.  She will not lay still and let the nurse work on her.  Nausea and vomiting stops in the emergency room but she has bloodied she and spots.  She is somewhat tachycardic with hypertension.  Additionally her fingerstick is greater than 600.  I will give her a little bit of Ativan IV since 1 mg IM did not do anything much to allow [place] to work on her.  We will also give her a little bit of IM Haldol.  Patient is not able to give me any other history at this time.      Past Medical History:  Diagnosis Date   Bipolar 1 disorder ([place])    CAD (coronary artery disease)    Diabetes mellitus without complication ([place])    HLD (hyperlipidemia)    HTN (hypertension)     Patient Active Problem List   Diagnosis Date Noted   Hyperkalemia 07/30/2020   Hyponatremia 07/30/2020   Stage 3a chronic kidney disease ([place])    CAD (coronary artery disease) 05/05/2020   Hyperosmolar hyperglycemic state (HHS) ([place]) 05/05/2020   Abdominal pain 05/05/2020   GERD (gastroesophageal reflux disease) 05/05/2020   Tobacco abuse 05/05/2020   Left arm numbness 05/05/2020   Depression 05/05/2020   Hyperglycemia    AKI (acute kidney injury) ([place]) 04/30/2020   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[place]) 04/30/2020   Severe sepsis with acute organ dysfunction ([place]) 04/30/2020   DKA, type 1 ([place]) 02/26/2020   Acute renal failure ([place])    DKA (diabetic ketoacidosis) ([place]) 02/25/2020    Malnutrition ([place]) 02/25/2020   HTN (hypertension) 02/25/2020   HLD (hyperlipidemia) 02/25/2020   Soft tissue mass 02/25/2020   H/O cocaine abuse ([place]) 02/17/2018   Bipolar 1 disorder ([place]) 04/22/2016   Old myocardial infarction 04/22/2016   COPD (chronic obstructive pulmonary disease) ([place]) 01/22/2016    Past Surgical History:  Procedure Laterality Date   COLONOSCOPY     ESOPHAGOGASTRODUODENOSCOPY      Prior to Admission medications   Medication Sig Start Date End Date Taking? Authorizing Provider  albuterol (VENTOLIN HFA) 108 (90 Base) MCG/ACT inhaler Inhale 2 puffs into the lungs every 6 (six) hours as needed for wheezing. 05/07/20 05/02/21  Loletha Grayer, MD  aluminum-magnesium hydroxide-simethicone (MAALOX) 200-200-20 MG/5ML SUSP Take 30 mLs by mouth 4 (four) times daily -  before meals and at bedtime. 06/16/20   Carrie Mew, MD  amLODipine (NORVASC) 10 MG tablet Take 1 tablet (10 mg total) by mouth daily for 3 days. 06/03/20 06/06/20  Naaman Plummer, MD  atorvastatin (LIPITOR) 40 MG tablet Take 1 tablet (40 mg total) by mouth at bedtime. 05/07/20 05/02/21  Loletha Grayer, MD  famotidine (PEPCID) 20 MG tablet Take 1 tablet (20 mg total) by mouth 2 (two) times daily. 06/16/20   Carrie Mew, MD  insulin glargine (LANTUS SOLOSTAR) 100 UNIT/ML Solostar Pen Inject 25 Units into the skin daily. 05/07/20 05/02/21  Loletha Grayer, MD  Insulin Pen Needle  32G X 4 MM MISC 1 Dose by Does not apply route 4 (four) times daily -  before meals and at bedtime. 05/07/20   Loletha Grayer, MD  losartan (COZAAR) 50 MG tablet Take 1 tablet (50 mg total) by mouth daily for 3 days. 06/03/20 06/06/20  Naaman Plummer, MD  metoCLOPramide (REGLAN) 10 MG tablet Take 1 tablet (10 mg total) by mouth every 6 (six) hours as needed. 06/16/20   Carrie Mew, MD  mirtazapine (REMERON) 30 MG tablet Take 1 tablet (30 mg total) by mouth at bedtime. 05/07/20   Loletha Grayer, MD  nicotine polacrilex (NICORETTE)  4 MG gum Take by mouth. Patient not taking: No sig reported 01/24/20   [provider]  omeprazole (PRILOSEC) 40 MG capsule Take 1 capsule (40 mg total) by mouth daily for 3 days. 06/03/20 06/06/20  Naaman Plummer, MD  ondansetron (ZOFRAN ODT) 4 MG disintegrating tablet Take 1 tablet (4 mg total) by mouth every 6 (six) hours as needed for nausea or vomiting. 05/22/20   Enzo Bi, MD  polyethylene glycol (MIRALAX / GLYCOLAX) 17 g packet Take 34 g by mouth daily. 05/22/20   Enzo Bi, MD  pregabalin (LYRICA) 50 MG capsule Take 1 capsule (50 mg total) by mouth 2 (two) times daily for 3 days. 06/03/20 06/06/20  Naaman Plummer, MD  REXULTI 1 MG TABS tablet Take 1 mg by mouth every morning. 03/11/20   [provider]  sucralfate (CARAFATE) 1 GM/10ML suspension Take 10 mLs (1 g total) by mouth 4 (four) times daily. 06/03/20 06/03/21  Naaman Plummer, MD    Allergies Patient has no known allergies.  Family History  Problem Relation Age of Onset   Heart disease Mother    Prostate cancer Father     Social History Social History   Tobacco Use   Smoking status: Every Day    Pack years: 0.00   Smokeless tobacco: Never  Vaping Use   Vaping Use: Never used  Substance Use Topics   Alcohol use: Not Currently   Drug use: Yes    Frequency: 3.0 times per week    Types: Marijuana    Review of Systems Except for for belly pain I am not able to obtain any other history.  ____________________________________________   PHYSICAL EXAM:  VITAL SIGNS: ED Triage Vitals  Enc Vitals Group     BP 07/30/20 1226 (!) 218/118     Pulse Rate 07/30/20 1226 (!) 133     Resp 07/30/20 1226 (!) 22     Temp 07/30/20 1226 98.9 F (37.2 C)     Temp Source 07/30/20 1226 Oral     SpO2 07/30/20 1226 98 %     Weight 07/30/20 1143 125 lb (56.7 kg)     Height 07/30/20 1143 5\' 2"  (1.575 m)     Head Circumference --      Peak Flow --      Pain Score 07/30/20 1143 0     Pain Loc --      Pain Edu? --       Excl. in [place]? --     Constitutional: Patient is awake and alert follows some commands occasionally but most the time does not. Eyes: Conjunctivae are normal. PER. EOMI. Head: Atraumatic. Nose: No congestion/rhinnorhea. Mouth/Throat: Mucous membranes are slightly dry Neck: No stridor.  When patient was sedated somewhat I was able to examine her neck for stiffness in her neck did appear to be stiff therefore I  began to prep for spinal tap. Cardiovascular: Rapid rate, regular rhythm. Grossly normal heart sounds.  Good peripheral circulation. Respiratory: Normal respiratory effort.  No retractions. Lungs CTAB. Gastrointestinal: Soft appears to be diffusely tender no distention. No abdominal bruits. No apparent CVA tenderness. Musculoskeletal: No lower extremity tenderness nor edema.   Neurologic: Patient moving all extremities equally and well but does not follow commands consistently and will not lay still. Skin:  Skin is warm, dry and intact. No rash noted.   ____________________________________________   LABS (all labs ordered are listed, but only abnormal results are displayed)  Labs Reviewed  COMPREHENSIVE METABOLIC PANEL - Abnormal; Notable for the following components:      Result Value   Sodium 129 (*)    Potassium 5.9 (*)    Chloride 87 (*)    CO2 21 (*)    Glucose, Bld 1,188 (*)    BUN 55 (*)    Creatinine, Ser 2.09 (*)    AST 87 (*)    Alkaline Phosphatase 193 (*)    Total Bilirubin 1.6 (*)    GFR, Estimated 26 (*)    Anion gap 21 (*)    All other components within normal limits  CBC WITH DIFFERENTIAL/PLATELET - Abnormal; Notable for the following components:   RBC 3.83 (*)    Lymphs Abs 0.6 (*)    All other components within normal limits  URINALYSIS, ROUTINE W REFLEX MICROSCOPIC - Abnormal; Notable for the following components:   Color, Urine STRAW (*)    APPearance HAZY (*)    Glucose, UA >=500 (*)    Hgb urine dipstick SMALL (*)    Ketones, ur 5 (*)     Protein, ur 100 (*)    Bacteria, UA RARE (*)    All other components within normal limits  CBG MONITORING, ED - Abnormal; Notable for the following components:   Glucose-Capillary >600 (*)    All other components within normal limits  CBG MONITORING, ED - Abnormal; Notable for the following components:   Glucose-Capillary >600 (*)    All other components within normal limits  CBG MONITORING, ED - Abnormal; Notable for the following components:   Glucose-Capillary >600 (*)    All other components within normal limits  CBG MONITORING, ED - Abnormal; Notable for the following components:   Glucose-Capillary >600 (*)    All other components within normal limits  CULTURE, BLOOD (SINGLE)  LIPASE, BLOOD  BLOOD GAS, ARTERIAL  TROPONIN I (HIGH SENSITIVITY)   ____________________________________________  EKG  EKG is read by computer as A. fib but it appears to be very irregular and looks more like sinus tach in fact there are P waves in V4 and V5.  There are nonspecific ST-T wave changes ____________________________________________  RADIOLOGY Gertha Calkin, personally viewed and evaluated these images (plain radiographs) as part of my medical decision making, as well as reviewing the written report by the radiologist.  ED MD interpretation:   Official radiology report(s): CT Head Wo Contrast  Result Date: 07/30/2020 CLINICAL DATA:  Mental status change. Vomiting and headache. Hyperglycemia. EXAM: CT HEAD WITHOUT CONTRAST TECHNIQUE: Contiguous axial images were obtained from the base of the skull through the vertex without intravenous contrast. COMPARISON:  Head CT 07/18/2020 and MRI 05/05/2020 FINDINGS: Brain: There is no evidence of an acute infarct, intracranial hemorrhage, mass, midline shift, or extra-axial fluid collection. The ventricles and sulci are within normal limits for age. Patchy hypodensities in the cerebral white matter bilaterally are unchanged  and nonspecific but  compatible with mild-to-moderate chronic small vessel ischemic disease. Vascular: Calcified atherosclerosis at the skull base. No hyperdense vessel. Skull: No fracture or suspicious osseous lesion. Sinuses/Orbits: Visualized paranasal sinuses and mastoid air cells are clear. Visualized orbits are unremarkable. Other: None. IMPRESSION: 1. No evidence of acute intracranial abnormality. 2. Mild-to-moderate chronic small vessel ischemic disease. Electronically Signed   By: Logan Bores M.D.   On: 07/30/2020 15:31    ____________________________________________   PROCEDURES  Procedure(s) performed (including Critical Care): Critical care time including evaluating the patient several times watching her insulin drip discussing the patient with the hospitalist reviewing her old records and doing a spinal tap count for approximately 1 hour of critical care time  Procedures   ____________________________________________   INITIAL IMPRESSION / ASSESSMENT AND PLAN / ED COURSE  As part of my medical decision making, I reviewed the following data within the [place] everywhere and the hospital charts were reviewed.  Patient does not appear to have had any prior hematemesis although she has had an upper GI endoscopy.     I am not sure that the patient's blood pressure is accurate as she is fighting all the time when blood pressure monitor goes off if I can get her to relax we will try to get another blood pressure.  We will also CT her head and belly when she comes down.   ----------------------------------------- 3:35 PM on 07/30/2020 ----------------------------------------- CTs are pending.  When I reviewed the belly CT and noticed a very large bladder repair attempt Foley in and she has now a temperature of 103.  I have ordered sepsis protocol just now.  Oncoming emergency physician has been notified and will follow up.  I have ordered Maxipime 2 g IV       ____________________________________________   FINAL CLINICAL IMPRESSION(S) / ED DIAGNOSES  Final diagnoses:  Hyperosmolar hyperglycemic state (HHS) ([place])  Sepsis after obstetrical procedure  Acute upper GI bleed     ED Discharge Orders     None        Note:  This document was prepared using Dragon voice recognition software and may include unintentional dictation errors.   Nena Polio, MD 07/30/20 1540 CTs are now back.  Obvious pathology is a very large lateral no obvious pathology except for the enlarged bladder which we are treating with the Foley.  We will get a COVID swab as this is been very common here lately.  If this explains her fever that will be great otherwise I will discuss with oncoming physician spinal tap.   Nena Polio, MD 07/30/20 1551 Patient has no obvious source for her fever.  Urine is clear COVID is negative lung exam seem to be clear and patient is not coughing.  I will get a chest x-ray just to make sure this. Patient is altered still from her previous sedation with the Ativan and Haldol I gave her as well as the initial alteration mental status.  She is unable to consent however this is an emergency so I prepped and draped her in usual sterile fashion and anesthetized the skin with 1% lidocaine and then gave her another 1 mg of Ativan IV to help restrain her movement.  Spinal tap needle was inserted in the L4-5 interspace and about 3 drops of CSF was obtained this was not enough to do anything with in fact most of the drops did not get into the tube as I was unable to get  the tube in time before the drops fell out.  I could not obtain any more CSF.  The hospitalist was notified of the above and we will treat her empirically.  Patient is getting IV fluids and having her hyperglycemia treated with the drip.   Nena Polio, MD 07/30/20 [phone]

## 2020-07-30 NOTE — ED Notes (Signed)
2 sets of blood cultures sent to lab.

## 2020-07-30 NOTE — ED Triage Notes (Signed)
Pt here with vomitting and HA.Pt cbg was very high with ems. 12 lead unremarkable due to pt not being able to remain still. 51ml of fluids given.

## 2020-07-30 NOTE — ED Notes (Signed)
Ice packs applied to bilateral underarms and groin areas per hospitalist.

## 2020-07-30 NOTE — ED Notes (Signed)
Lab called to obtain blood due to pt behaving erratically and not staying still. This RN was successful with IV placement but bot with blood draw.

## 2020-07-30 NOTE — ED Notes (Signed)
Informed EDP of recent lower cbg check. EDP wants insulin to continue at rate of 5 units/hr and to be notified when cbg is at 250.

## 2020-07-30 NOTE — Consult Note (Signed)
PHARMACY -  BRIEF ANTIBIOTIC NOTE   Pharmacy has received consult(s) for vancomycin and cefepime from an ED provider.  The patient's profile has been reviewed for ht/wt/allergies/indication/available labs.    One time order(s) placed for:  Vancomycin 1.25g x1 Cefepime 2g x1 Also receiving Metronidazole 500mg  IV x1  Further antibiotics/pharmacy consults should be ordered by admitting physician if indicated.                       Thank you, Lorna Dibble 07/30/2020  3:41 PM

## 2020-07-30 NOTE — ED Notes (Signed)
Report received from [place], [place]

## 2020-07-30 NOTE — ED Notes (Signed)
Lab notified of lab draw.

## 2020-07-30 NOTE — ED Notes (Signed)
Lab at bedside

## 2020-07-30 NOTE — ED Notes (Signed)
Patient transported to CT 

## 2020-07-30 NOTE — Progress Notes (Signed)
PHARMACIST - PHYSICIAN COMMUNICATION  CONCERNING:  Enoxaparin (Lovenox) for DVT Prophylaxis   DESCRIPTION: Patient was prescribed enoxaprin 40mg  q24 hours for VTE prophylaxis.   Filed Weights   07/30/20 1143  Weight: 56.7 kg (125 lb)    Body mass index is 22.86 kg/m.  Estimated Creatinine Clearance: 21.2 mL/min (A) (by C-G formula based on SCr of 2.09 mg/dL (H)).  Patient is candidate for enoxaparin 30mg  every 24 hours based on CrCl <87ml/min or Weight <45kg  RECOMMENDATION: Pharmacy has adjusted enoxaparin dose per [facility] policy.  Patient is now receiving enoxaparin 30 mg every 24 hours    Darnelle Bos, PharmD Clinical Pharmacist  07/30/2020 3:50 PM

## 2020-07-30 NOTE — Sepsis Progress Note (Signed)
Notified bedside nurse of need to draw lactic acid.  

## 2020-07-30 NOTE — Progress Notes (Signed)
Patient admitted to the hospital for acute metabolic encephalopathy and DKA and while in the ER developed a fever with a T max of 103. She met sepsis criteria due to fever, tachycardia, tachypnea and elevated lactic acid level. She had no leukocytosis and no clear source of infection. Meningitis was suspected because patient presented with mental status changes and had a fever. Spinal tap was attempted without success. Patient received sepsis fluid in the ER  (3L NS as well as 1L of LR) and was started empirically on Rocephin, Ampicillin and Vancomycin. Obtain serial lactic acid levels    NSTEMI Patient has a history of CAD and presented with hyperglycemia. She has bumped her troponin with concerns for NSTEMI Start patient on a heparin drip Administer rectal aspirin Obtain 2D echocardiogram to assess LVEF and rule out regional wall motion abnormality Consult cardiology    Called and spoke to patient's daughter Erica Mercer over the phone and explained to her in detail about her mother's current condition. She understands that her mother is critically  ill from complications related to Diabetes mellitus, she has an AKI and may also have a NSTEMI. She agrees with the treatment plan for now. All questions and concerns have been addressed.

## 2020-07-30 NOTE — Consult Note (Signed)
Pharmacy Antibiotic Note  Erica Mercer is a 65 y.o. female admitted on 07/30/2020 with meningitis.  Pharmacy has been consulted for Vancomycin dosing.  Plan: Scr (BL 1.1-1.3 in 04/2020) 2.09>1.76 Vancomycin 1.25g x1 (~22mg /kg); then with current Ke estimatation for half-life ~23hrs to dosing interval of q24h. Given AKI w/ recovering renal fxn and next dose in 24hrs, will assess with AM labs to determine full maintenance dosing schedule. Also receiving Ampicillin and Ceftriaxone.  Height: 5\' 2"  (157.5 cm) Weight: 56.7 kg (125 lb) IBW/kg (Calculated) : 50.1  Temp (24hrs), Avg:102.8 F (39.3 C), Min:98.9 F (37.2 C), Max:103.7 F (39.8 C)  Recent Labs  Lab 07/30/20 1336 07/30/20 1617  WBC 6.7  --   CREATININE 2.09* 1.76*    Estimated Creatinine Clearance: 25.2 mL/min (A) (by C-G formula based on SCr of 1.76 mg/dL (H)).    No Known Allergies  Antimicrobials this admission: Vancomycin (7/06 >>  Ceftriaxone (7/06 >>  Ampicillin (7/06 >>  Dose adjustments this admission: Will CTM renal recovery  Microbiology results: 7/06 BCx: single set - pending  Thank you for allowing pharmacy to be a part of this patient's care.  Lorna Dibble 07/30/2020 5:26 PM

## 2020-07-31 ENCOUNTER — Inpatient Hospital Stay (HOSPITAL_COMMUNITY)
Admit: 2020-07-31 | Discharge: 2020-07-31 | Disposition: A | Discharge status: Home / Self Care | Payer: Medicare Other | Attending: Internal Medicine | Admitting: Internal Medicine

## 2020-07-31 DIAGNOSIS — E871 Hypo-osmolality and hyponatremia: Secondary | ICD-10-CM

## 2020-07-31 DIAGNOSIS — E872 Acidosis, unspecified: Secondary | ICD-10-CM

## 2020-07-31 DIAGNOSIS — G039 Meningitis, unspecified: Secondary | ICD-10-CM

## 2020-07-31 DIAGNOSIS — I214 Non-ST elevation (NSTEMI) myocardial infarction: Secondary | ICD-10-CM

## 2020-07-31 DIAGNOSIS — E1311 Other specified diabetes mellitus with ketoacidosis with coma: Secondary | ICD-10-CM

## 2020-07-31 DIAGNOSIS — N179 Acute kidney failure, unspecified: Secondary | ICD-10-CM

## 2020-07-31 DIAGNOSIS — I25118 Atherosclerotic heart disease of native coronary artery with other forms of angina pectoris: Secondary | ICD-10-CM

## 2020-07-31 LAB — URINE DRUG SCREEN, QUALITATIVE (ARMC ONLY)
Amphetamines, Ur Screen: NOT DETECTED
Barbiturates, Ur Screen: NOT DETECTED
Benzodiazepine, Ur Scrn: NOT DETECTED
Cannabinoid 50 Ng, Ur ~~LOC~~: POSITIVE — AB
Cocaine Metabolite,Ur ~~LOC~~: NOT DETECTED
MDMA (Ecstasy)Ur Screen: NOT DETECTED
Methadone Scn, Ur: NOT DETECTED
Opiate, Ur Screen: NOT DETECTED
Phencyclidine (PCP) Ur S: NOT DETECTED
Tricyclic, Ur Screen: NOT DETECTED

## 2020-07-31 LAB — BASIC METABOLIC PANEL WITH GFR
Anion gap: 8 (ref 5–15)
BUN: 36 mg/dL — ABNORMAL HIGH (ref 8–23)
CO2: 26 mmol/L (ref 22–32)
Calcium: 8.7 mg/dL — ABNORMAL LOW (ref 8.9–10.3)
Chloride: 110 mmol/L (ref 98–111)
Creatinine, Ser: 1.44 mg/dL — ABNORMAL HIGH (ref 0.44–1.00)
GFR, Estimated: 40 mL/min — ABNORMAL LOW
Glucose, Bld: 113 mg/dL — ABNORMAL HIGH (ref 70–99)
Potassium: 4 mmol/L (ref 3.5–5.1)
Sodium: 144 mmol/L (ref 135–145)

## 2020-07-31 LAB — GLUCOSE, CAPILLARY
Glucose-Capillary: 188 mg/dL — ABNORMAL HIGH (ref 70–99)
Glucose-Capillary: 110 mg/dL — ABNORMAL HIGH (ref 70–99)
Glucose-Capillary: 72 mg/dL (ref 70–99)
Glucose-Capillary: 196 mg/dL — ABNORMAL HIGH (ref 70–99)
Glucose-Capillary: 159 mg/dL — ABNORMAL HIGH (ref 70–99)
Glucose-Capillary: 51 mg/dL — ABNORMAL LOW (ref 70–99)
Glucose-Capillary: 157 mg/dL — ABNORMAL HIGH (ref 70–99)

## 2020-07-31 LAB — HEMOGLOBIN: Hemoglobin: 10.1 g/dL — ABNORMAL LOW (ref 12.0–15.0)

## 2020-07-31 LAB — LACTIC ACID, PLASMA: Lactic Acid, Venous: 1.2 mmol/L (ref 0.5–1.9)

## 2020-07-31 LAB — ECHOCARDIOGRAM COMPLETE
AR max vel: 1.91 cm2
AV Area VTI: 2 cm2
AV Area mean vel: 1.9 cm2
AV Mean grad: 5 mmHg
AV Peak grad: 9.5 mmHg
Ao pk vel: 1.54 m/s
Area-P 1/2: 3.13 cm2
Height: 62 in
MV VTI: 1.5 cm2
S' Lateral: 2.1 cm
Weight: 1527.35 oz

## 2020-07-31 LAB — LIPASE, BLOOD: Lipase: 23 U/L (ref 11–51)

## 2020-07-31 LAB — AMYLASE: Amylase: 78 U/L (ref 28–100)

## 2020-07-31 LAB — HEPARIN LEVEL (UNFRACTIONATED)
Heparin Unfractionated: 0.95 IU/mL — ABNORMAL HIGH (ref 0.30–0.70)
Heparin Unfractionated: 0.74 IU/mL — ABNORMAL HIGH (ref 0.30–0.70)

## 2020-07-31 LAB — BETA-HYDROXYBUTYRIC ACID
Beta-Hydroxybutyric Acid: 0.11 mmol/L (ref 0.05–0.27)
Beta-Hydroxybutyric Acid: 0.1 mmol/L (ref 0.05–0.27)

## 2020-07-31 LAB — TROPONIN I (HIGH SENSITIVITY): Troponin I (High Sensitivity): 2075 ng/L (ref <18)

## 2020-07-31 MED ORDER — LOSARTAN POTASSIUM 50 MG PO TABS
50.0000 mg | ORAL_TABLET | Freq: Every day | ORAL | Status: DC
  Administered 2020-07-31 – 2020-08-03 (×4): 50 mg via ORAL
  Filled 2020-07-31 (×4): qty 1

## 2020-07-31 MED ORDER — VANCOMYCIN HCL 500 MG/100ML IV SOLN
500.0000 mg | INTRAVENOUS | Status: DC
  Administered 2020-07-31: 500 mg via INTRAVENOUS
  Filled 2020-07-31: qty 100

## 2020-07-31 MED ORDER — INSULIN ASPART 100 UNIT/ML IJ SOLN
3.0000 [IU] | Freq: Three times a day (TID) | INTRAMUSCULAR | Status: DC
  Administered 2020-07-31 – 2020-08-01 (×3): 3 [IU] via SUBCUTANEOUS
  Filled 2020-07-31 (×2): qty 1

## 2020-07-31 MED ORDER — AMLODIPINE BESYLATE 10 MG PO TABS
10.0000 mg | ORAL_TABLET | Freq: Every day | ORAL | Status: DC
  Administered 2020-07-31 – 2020-08-03 (×4): 10 mg via ORAL
  Filled 2020-07-31 (×4): qty 1

## 2020-07-31 MED ORDER — LABETALOL HCL 5 MG/ML IV SOLN
5.0000 mg | INTRAVENOUS | Status: DC | PRN
  Administered 2020-07-31: 5 mg via INTRAVENOUS
  Filled 2020-07-31: qty 4

## 2020-07-31 MED ORDER — ASPIRIN EC 81 MG PO TBEC
81.0000 mg | DELAYED_RELEASE_TABLET | Freq: Every day | ORAL | Status: DC
  Administered 2020-08-01 – 2020-08-03 (×3): 81 mg via ORAL
  Filled 2020-07-31 (×3): qty 1

## 2020-07-31 MED ORDER — METOPROLOL TARTRATE 25 MG PO TABS
25.0000 mg | ORAL_TABLET | Freq: Two times a day (BID) | ORAL | Status: DC
  Administered 2020-07-31 – 2020-08-03 (×6): 25 mg via ORAL
  Filled 2020-07-31 (×6): qty 1

## 2020-07-31 MED ORDER — SODIUM CHLORIDE 0.9 % IV SOLN
1.0000 g | INTRAVENOUS | Status: DC
  Filled 2020-07-31: qty 10

## 2020-07-31 MED ORDER — INSULIN GLARGINE 100 UNIT/ML ~~LOC~~ SOLN
20.0000 [IU] | SUBCUTANEOUS | Status: DC
  Administered 2020-07-31: 20 [IU] via SUBCUTANEOUS
  Filled 2020-07-31 (×2): qty 0.2

## 2020-07-31 NOTE — Evaluation (Addendum)
Clinical/Bedside Swallow Evaluation Patient Details  Name: Erica Mercer MRN: [id number] Date of Birth: 01-Oct-1955  Today's Date: 07/31/2020 Time: SLP Start Time (ACUTE ONLY): 0840 SLP Stop Time (ACUTE ONLY): 0930 SLP Time Calculation (min) (ACUTE ONLY): 50 min  Past Medical History:  Past Medical History:  Diagnosis Date   Bipolar 1 disorder ([place])    CAD (coronary artery disease)    Diabetes mellitus without complication ([place])    HLD (hyperlipidemia)    HTN (hypertension)    Past Surgical History:  Past Surgical History:  Procedure Laterality Date   COLONOSCOPY     ESOPHAGOGASTRODUODENOSCOPY     HPI:  Pt  is a 65 y.o. female with medical history significant for diabetes mellitus, coronary artery disease, bipolar disorder, hypertension who was brought into the ER by EMS for evaluation of diffuse abdominal pain and vomiting; possible emesis containing blood.  She was noted to be agitated and unable to stay still or follow commands.  Fingerstick was greater than 600g/dl.  Patient received a dose of IM Haldol and 2 doses of Ativan, 1 IV and another IM.  Sodium 129.  CT Head: no evidence of acute intracranial abnormality. Mild-to-moderate chronic small vessel ischemic disease.  CT scan of abdomen and pelvis shows distended bladder.  CXR: Stable mild cardiomegaly. No acute chest findings.  Pt more alert and MD has ordered at diet this morning.   Assessment / Plan / Recommendation Clinical Impression  Pt appears to present w/ adequate oropharyngeal phase swallow function w/ No apparent oropharyngeal phase dysphagia noted, No neuromuscular deficits noted. Pt consumed po trials w/ No overt, clinical s/s of aspiration during po trials. Pt appears at reduced risk for aspiration following general aspiration precautions. Pt was alert during this session; followed instructions w/ min verbal cues and answered general questions re: self. Oriented x2.  During po trials, pt consumed consistencies w/ no  overt coughing, decline in vocal quality, or change in respiratory presentation during/post trials. O2 sats remained 97%. Oral phase appeared [facility] w/ timely bolus management, mashing/gumming of soft solids, and control of bolus propulsion for A-P transfer for swallowing -- however, pt expectorated the soft solids d/t dislike of taste of the foods offered. She stated she preferred "something to drink". Oral clearing achieved w/ all trial consistencies. Pt is Edentulous and stated she does not wear Dentures. OM Exam appeared [facility] w/ no unilateral weakness noted. Speech Clear. Pt fed self w/ setup support.   Recommend a more Mech Soft consistency diet w/ well-Cut meats, moistened foods; Thin liquids. Recommend general aspiration precautions, Pills WHOLE in Puree for safer, easier swallowing as pt described Larger pills causing difficulty to swallow. Pt did this w/ NSG in room w/ no difficulty noted. Education given on Pills in Puree; food consistencies and easy to eat options d/t Edentulous status; general aspiration precautions. General REFLUX precautions rec'd. NSG to reconsult if any new needs arise while admitted. NSG agreed. MD updated on pt's desire for Supplement Drinks such as Glucerna. Noted pt's weight and BMI; unsure if related to impact of lack of desire for foods, lack of dentition, and/or presentation of head CT indicating "Mild-to-moderate chronic small vessel ischemic disease". SLP Visit Diagnosis: Dysphagia, unspecified (R13.10)    Aspiration Risk   (reduced following general precautions)    Diet Recommendation  A more Mech Soft consistency diet w/ well-Cut meats, moistened foods d/t Edentulous status; Thin liquids. Recommend general aspiration and Reflux precautions. Tray setup support.  Medication Administration: Whole meds with puree (for  ease of swallowing)    Other  Recommendations Recommended Consults:  (Dietician f/u) Oral Care Recommendations: Oral care BID Other Recommendations:   (n/a)   Follow up Recommendations None      Frequency and Duration  (n/a)   (n/a)       Prognosis Prognosis for Safe Diet Advancement: Good Barriers to Reach Goals: Motivation;Time post onset;Severity of deficits;Behavior      Swallow Study   General Date of Onset: 07/30/20 HPI: Pt  is a 65 y.o. female with medical history significant for diabetes mellitus, coronary artery disease, bipolar disorder, hypertension who was brought into the ER by EMS for evaluation of diffuse abdominal pain and vomiting; possible emesis containing blood.  She was noted to be agitated and unable to stay still or follow commands.  Fingerstick was greater than 600g/dl.  Patient received a dose of IM Haldol and 2 doses of Ativan, 1 IV and another IM.  Sodium 129.  CT Head: no evidence of acute intracranial abnormality. Mild-to-moderate chronic small vessel ischemic disease.  CT scan of abdomen and pelvis shows distended bladder.  CXR: Stable mild cardiomegaly. No acute chest findings.  Pt more alert and MD has ordered at diet this morning. Type of Study: Bedside Swallow Evaluation Previous Swallow Assessment: none Diet Prior to this Study: Regular;Thin liquids (just ordered by MD) Temperature Spikes Noted: No (wbc 12.2) Respiratory Status: Room air History of Recent Intubation: No Behavior/Cognition: Alert;Cooperative;Pleasant mood;Distractible;Requires cueing Oral Cavity Assessment: Dry (min) Oral Care Completed by SLP: Yes Oral Cavity - Dentition: Edentulous Vision: Functional for self-feeding Self-Feeding Abilities: Able to feed self;Needs assist;Needs set up (min++) Patient Positioning: Upright in bed (needed support to sit upright in bed) Baseline Vocal Quality: Normal Volitional Cough: Strong Volitional Swallow: Able to elicit    Oral/Motor/Sensory Function Overall Oral Motor/Sensory Function: Within functional limits   Ice Chips Ice chips: Within functional limits Presentation: Spoon (fed; 2  trials)   Thin Liquid Thin Liquid: Within functional limits Presentation: Self Fed;Straw (~12+ ozs)    Nectar Thick Nectar Thick Liquid: Not tested   Honey Thick Honey Thick Liquid: Not tested   Puree Puree: Within functional limits Presentation: Spoon (fed; 10 trials)   Solid     Solid:  (appeared adequate but pt expectorated the boluses d/t taste) Presentation: Self Fed (2 trials)       Orinda Kenner, MS, CCC-SLP Speech Language Pathologist Rehab Services [phone] [facility] 07/31/2020,11:52 AM

## 2020-07-31 NOTE — Consult Note (Signed)
ANTICOAGULATION CONSULT NOTE  Pharmacy Consult for heparin Indication: chest pain/ACS  No Known Allergies  Patient Measurements: Height: 5\' 2"  (157.5 cm) Weight: 43.3 kg (95 lb 7.4 oz) IBW/kg (Calculated) : 50.1 Heparin Dosing Weight: 56.7 kg  Vital Signs: Temp: 99.32 F (37.4 C) (07/07 0800) Temp Source: Bladder (07/07 0600) BP: 199/94 (07/07 0800) Pulse Rate: 85 (07/07 0800)  Labs: Recent Labs    07/30/20 1336 07/30/20 1538 07/30/20 1617 07/30/20 1746 07/30/20 1840 07/30/20 1955 07/30/20 1957 07/30/20 2314 07/31/20 0258  HGB 12.5  --   --   --  10.5*  --   --   --   --   HCT 38.1  --   --   --  29.9*  --   --   --   --   PLT 302  --   --   --  251  --   --   --   --   APTT  --   --   --   --   --  >200*  --   --   --   LABPROT  --   --   --   --   --  14.5  --   --   --   INR  --   --   --   --   --  1.1  --   --   --   HEPARINUNFRC  --   --   --   --   --   --   --   --  0.95*  CREATININE 2.09*  --  1.76*  --  1.61*  --   --   --  1.44*  TROPONINIHS  --    < >  --  792*  --   --  1,520* 2,075*  --    < > = values in this interval not displayed.     Estimated Creatinine Clearance: 26.6 mL/min (A) (by C-G formula based on SCr of 1.44 mg/dL (H)).   Medical History: Past Medical History:  Diagnosis Date   Bipolar 1 disorder ([place])    CAD (coronary artery disease)    Diabetes mellitus without complication ([place])    HLD (hyperlipidemia)    HTN (hypertension)     Medications:  No PTA anticoagulation or antiplatelet therapy   Assessment:  65 y.o. female with medical history significant for T1DM, CAD, bipolar disorder, and HTN who was brought into the ER by EMS for evaluation of diffuse abdominal pain and vomiting. Pt was found to have troponin elevated to 792. Pharmacy has been consulted for heparin dosing for ACS.  Hgb: 10.5; plts: 251 Heparin Dosing Weight: 56.7 kg  Goal of Therapy:  Heparin level 0.3-0.7 units/ml Monitor platelets by anticoagulation  protocol: Yes   Plan:  7/7 0258 HL 0.95, supratherapeutic Decrease heparin drip to 600 units/hr Recheck HL at 1600 to confirm CBC daily while on heparin drip  Tawnya Crook, PharmD 07/31/2020,8:59 AM

## 2020-07-31 NOTE — Consult Note (Signed)
Cardiology Consultation:   Patient ID: Erica Mercer MRN: [id number]; DOB: 1955/04/26  Admit date: 07/30/2020 Date of Consult: 07/31/2020  PCP:  Erica Jaksch, NP   [facility] Providers Cardiologist: Erica Mercer Physician requesting consult : Dr. Leslye Mercer Reason for consult: Non-STEMI  Patient Profile:   Erica Mercer is a 65 y.o. female with a hx of diabetes, bipolar disorder, hypertension prior history of DKA, numerous emergency room visits and admissions here and at outside facilities, presenting with mental status changes, nausea vomiting, sugar 600, felt to have sepsis 91 trending up to over 2000, started on heparin infusion  History of Present Illness:   [name] is a history of type 1 diabetes, followed at [facility] Cocaine abuse, notes indicating coronary artery disease first diagnosed March 2018, Notes also indicating prior heart catheterization showing " clean coronaries" results unavailable at this time Smoker, depression, hypertension, DKA, bipolar, Presenting with symptoms as detailed above  Review of records at [facility] shows no recent cardiac catheterization, From prior echocardiograms documenting normal ejection fraction No recent stress testing  Presenting to the emergency room with hypertension, systolic pressures over 573, tachycardic, febrile temperature 103  Received IV fluids, antibiotics  Echocardiogram has been completed, reviewed personally by myself again documenting normal ejection fraction, no focal wall motion abnormalities, no significant valvular heart disease  Past Medical History:  Diagnosis Date   Bipolar 1 disorder ([place])    CAD (coronary artery disease)    Diabetes mellitus without complication ([place])    HLD (hyperlipidemia)    HTN (hypertension)     Past Surgical History:  Procedure Laterality Date   COLONOSCOPY     ESOPHAGOGASTRODUODENOSCOPY       Home Medications:  Prior to Admission medications   Medication  Sig Start Date End Date Taking? Authorizing Provider  atorvastatin (LIPITOR) 40 MG tablet Take 1 tablet (40 mg total) by mouth at bedtime. 05/07/20 05/02/21 Yes Erica Mercer  famotidine (PEPCID) 20 MG tablet Take 1 tablet (20 mg total) by mouth 2 (two) times daily. 06/16/20  Yes Erica Mew, Mercer  insulin glargine (LANTUS SOLOSTAR) 100 UNIT/ML Solostar Pen Inject 25 Units into the skin daily. 05/07/20 05/02/21 Yes Erica Mercer  mirtazapine (REMERON) 30 MG tablet Take 1 tablet (30 mg total) by mouth at bedtime. 05/07/20  Yes Erica Mercer  albuterol (VENTOLIN HFA) 108 (90 Base) MCG/ACT inhaler Inhale 2 puffs into the lungs every 6 (six) hours as needed for wheezing. 05/07/20 05/02/21  Erica Mercer  aluminum-magnesium hydroxide-simethicone (MAALOX) 200-200-20 MG/5ML SUSP Take 30 mLs by mouth 4 (four) times daily -  before meals and at bedtime. 06/16/20   Erica Mew, Mercer  amLODipine (NORVASC) 10 MG tablet Take 1 tablet (10 mg total) by mouth daily for 3 days. 06/03/20 06/06/20  Erica Mercer  Insulin Pen Needle 32G X 4 MM MISC 1 Dose by Does not apply route 4 (four) times daily -  before meals and at bedtime. 05/07/20   Erica Mercer  losartan (COZAAR) 50 MG tablet Take 1 tablet (50 mg total) by mouth daily for 3 days. 06/03/20 06/06/20  Erica Mercer  metoCLOPramide (REGLAN) 10 MG tablet Take 1 tablet (10 mg total) by mouth every 6 (six) hours as needed. 06/16/20   Erica Mew, Mercer  nicotine polacrilex (NICORETTE) 4 MG gum Take by mouth. Patient not taking: No sig reported 01/24/20   Provider, Historical, Mercer  omeprazole (PRILOSEC) 40 MG capsule Take 1 capsule (40  mg total) by mouth daily for 3 days. 06/03/20 06/06/20  Erica Mercer  ondansetron (ZOFRAN ODT) 4 MG disintegrating tablet Take 1 tablet (4 mg total) by mouth every 6 (six) hours as needed for nausea or vomiting. 05/22/20   Erica Mercer  polyethylene glycol (MIRALAX / GLYCOLAX) 17 g packet Take  34 g by mouth daily. 05/22/20   Erica Mercer  pregabalin (LYRICA) 50 MG capsule Take 1 capsule (50 mg total) by mouth 2 (two) times daily for 3 days. 06/03/20 06/06/20  Erica Mercer  REXULTI 1 MG TABS tablet Take 1 mg by mouth every morning. Patient not taking: No sig reported 03/11/20   Provider, Historical, Mercer  sucralfate (CARAFATE) 1 GM/10ML suspension Take 10 mLs (1 g total) by mouth 4 (four) times daily. 06/03/20 06/03/21  Erica Mercer    Inpatient Medications: Scheduled Meds:  amLODipine  10 mg Oral Daily   Chlorhexidine Gluconate Cloth  6 each Topical Daily   insulin aspart  0-15 Units Subcutaneous TID WC   insulin aspart  3 Units Subcutaneous TID WC   insulin glargine  20 Units Subcutaneous Q24H   losartan  50 mg Oral Daily   mouth rinse  15 mL Mouth Rinse BID    morphine injection  2 mg Intravenous Once   pantoprazole (PROTONIX) IV  40 mg Intravenous Q12H   Continuous Infusions:  ampicillin (OMNIPEN) IV 300 mL/hr at 07/31/20 0600   cefTRIAXone (ROCEPHIN)  IV 2 g (07/31/20 0957)   heparin 600 Units/hr (07/31/20 0809)   vancomycin     PRN Meds: dextrose, labetalol  Allergies:   No Known Allergies  Social History:   Social History   Socioeconomic History   Marital status: Single    Spouse name: Not on file   Number of children: Not on file   Years of education: Not on file   Highest education level: Not on file  Occupational History   Not on file  Tobacco Use   Smoking status: Every Day    Pack years: 0.00   Smokeless tobacco: Never  Vaping Use   Vaping Use: Never used  Substance and Sexual Activity   Alcohol use: Not Currently   Drug use: Yes    Frequency: 3.0 times per week    Types: Marijuana   Sexual activity: Not Currently  Other Topics Concern   Not on file  Social History Narrative   Not on file   Social Determinants of Health   Financial Resource Strain: Not on file  Food Insecurity: Not on file  Transportation Needs: Not on file   Physical Activity: Not on file  Stress: Not on file  Social Connections: Not on file  Intimate Partner Violence: Not on file    Family History:    Family History  Problem Relation Age of Onset   Heart disease Mother    Prostate cancer Father      ROS:  Please see the history of present illness.  Review of Systems  Unable to perform ROS: Mental status change    Physical Exam/Data:   Vitals:   07/31/20 0800 07/31/20 0900 07/31/20 1000 07/31/20 1100  BP: (!) 199/94 (!) 180/83 (!) 149/86 120/66  Pulse: 85 76 89 74  Resp: 12 13 14 14   Temp: 99.32 F (37.4 C) 99.14 F (37.3 C) 98.42 F (36.9 C) 98.96 F (37.2 C)  TempSrc:      SpO2: 99% 98% 97% 95%  Weight:  Height:        Intake/Output Summary (Last 24 hours) at 07/31/2020 1246 Last data filed at 07/31/2020 0600 Gross per 24 hour  Intake 2120.1 ml  Output 2450 ml  Net -329.9 ml   Last 3 Weights 07/30/2020 07/30/2020 07/18/2020  Weight (lbs) 95 lb 7.4 oz 125 lb 105 lb  Weight (kg) 43.3 kg 56.7 kg 47.628 kg     Body mass index is 17.46 kg/m.  General: Relatively nonverbal, thin well developed, in no acute distress HEENT: normal Lymph: no adenopathy Neck: no JVD Endocrine:  No thryomegaly Vascular: No carotid bruits; FA pulses 2+ bilaterally without bruits  Cardiac:  normal S1, S2; RRR; no murmur  Lungs:  clear to auscultation bilaterally, no wheezing, rhonchi or rales  Abd: soft, mildly tender, no hepatomegaly  Ext: no edema Musculoskeletal:  No deformities, BUE and BLE strength normal and equal Skin: warm and dry  Neuro:  CNs 2-12 intact, no focal abnormalities noted Psych:  Normal affect   EKG:  The EKG was personally reviewed and demonstrates:   Sinus tachycardia with nonspecific ST-T wave abnormality, LVH  Telemetry:  Telemetry was personally reviewed and demonstrates:   Normal sinus rhythm  Relevant CV Studies: Echocardiogram Ejection fraction greater than 55%, no focal wall motion abnormalities, no  significant valvular heart disease apart from mild to moderate TR  Laboratory Data:  High Sensitivity Troponin:   Recent Labs  Lab 07/30/20 1538 07/30/20 1746 07/30/20 1957 07/30/20 2314  TROPONINIHS 91* 792* 1,520* 2,075*     Chemistry Recent Labs  Lab 07/30/20 1617 07/30/20 1840 07/31/20 0258  NA 135 141 144  K 4.9 4.4 4.0  CL 102 109 110  CO2 18* 21* 26  GLUCOSE 657* 305* 113*  BUN 45* 44* 36*  CREATININE 1.76* 1.61* 1.44*  CALCIUM 8.8* 8.7* 8.7*  GFRNONAA 32* 35* 40*  ANIONGAP 15 11 8     Recent Labs  Lab 07/30/20 1336  PROT 7.3  ALBUMIN 3.9  AST 87*  ALT 40  ALKPHOS 193*  BILITOT 1.6*   Hematology Recent Labs  Lab 07/30/20 1336 07/30/20 1840  WBC 6.7 12.2*  RBC 3.83* 3.15*  HGB 12.5 10.5*  HCT 38.1 29.9*  MCV 99.5 94.9  MCH 32.6 33.3  MCHC 32.8 35.1  RDW 13.1 12.7  PLT 302 251   BNPNo results for input(s): BNP, PROBNP in the last 168 hours.  DDimer No results for input(s): DDIMER in the last 168 hours.   Radiology/Studies:  CT ABDOMEN PELVIS WO CONTRAST  Result Date: 07/30/2020 CLINICAL DATA:  Abdominal pain EXAM: CT ABDOMEN AND PELVIS WITHOUT CONTRAST TECHNIQUE: Multidetector CT imaging of the abdomen and pelvis was performed following the standard protocol without IV contrast. COMPARISON:  05/21/2020 FINDINGS: Motion and streak artifact are present. Findings below are within this limitation. Lower chest: No acute abnormality. Hepatobiliary: No focal liver abnormality is seen. No gallstones, gallbladder wall thickening, or biliary dilatation. Pancreas: Unremarkable. Spleen: Unremarkable. Adrenals/Urinary Tract: Small bilateral nonobstructing renal calculi as before. Largest measures 4 mm on the right. Extrarenal pelvis on the left. No hydronephrosis. Bladder is distended. Stomach/Bowel: Stomach is unremarkable. Bowel is normal in caliber. Stool is present throughout the colon. Vascular/Lymphatic: Aortic atherosclerosis. No enlarged lymph nodes are  identified. Reproductive: Calcified uterine fibroids. Other: No free fluid.  No acute abnormality of the abdominal wall. Musculoskeletal: No acute osseous abnormality. Right femoral head avascular necrosis. IMPRESSION: Distended bladder. Nonobstructing small renal calculi. Calcified uterine fibroids. Aortic atherosclerosis. Right femoral head AVN. Electronically  Signed   By: Macy Mis M.D.   On: 07/30/2020 15:37   CT Head Wo Contrast  Result Date: 07/30/2020 CLINICAL DATA:  Mental status change. Vomiting and headache. Hyperglycemia. EXAM: CT HEAD WITHOUT CONTRAST TECHNIQUE: Contiguous axial images were obtained from the base of the skull through the vertex without intravenous contrast. COMPARISON:  Head CT 07/18/2020 and MRI 05/05/2020 FINDINGS: Brain: There is no evidence of an acute infarct, intracranial hemorrhage, mass, midline shift, or extra-axial fluid collection. The ventricles and sulci are within normal limits for age. Patchy hypodensities in the cerebral white matter bilaterally are unchanged and nonspecific but compatible with mild-to-moderate chronic small vessel ischemic disease. Vascular: Calcified atherosclerosis at the skull base. No hyperdense vessel. Skull: No fracture or suspicious osseous lesion. Sinuses/Orbits: Visualized paranasal sinuses and mastoid air cells are clear. Visualized orbits are unremarkable. Other: None. IMPRESSION: 1. No evidence of acute intracranial abnormality. 2. Mild-to-moderate chronic small vessel ischemic disease. Electronically Signed   By: Logan Bores M.D.   On: 07/30/2020 15:31   DG Chest Portable 1 View  Result Date: 07/30/2020 CLINICAL DATA:  Sepsis. EXAM: PORTABLE CHEST 1 VIEW COMPARISON:  04/30/2020 FINDINGS: Mild cardiomegaly is unchanged from prior exam. No focal airspace disease. No pleural effusion or pneumothorax. Rounded calcification in the right axilla is unchanged. No acute osseous abnormalities are seen. IMPRESSION: Stable mild cardiomegaly.  No acute chest findings. Electronically Signed   By: Keith Rake M.D.   On: 07/30/2020 18:16     Assessment and Plan:   Non-STEMI Unable to exclude demand ischemia in the setting of profound hypertension on arrival systolic pressures over 201 with tachycardia, temperatures up to 103, DKA -Reassuring echocardiogram -EKG nonacute -Given underlying mental status changes, unable to discuss symptoms with her this morning as she is very lethargic, not a good cardiac catheterization candidate -Medical management at this time, continue heparin 48 hours -We will add low-dose beta-blocker  2.  Malignant hypertension Numbers improved on amlodipine 10 mg, losartan 50 We will add beta-blocker as above  3.  Diabetes poor control with complications/DKA Presenting with acute metabolic encephalopathy, DKA, fever, nausea vomiting On broad-spectrum antibiotics Management per hospitalist service  4.  Fever Unable to get LP, Unable to exclude meningitis On broad-spectrum antibiotics, appears to be improving with better mentation, still with abdominal pain  5.  Acute on chronic renal failure Creatinine 2.09 down to 1.44 with IV hydration     Total encounter time more than 110 minutes  Greater than 50% was spent in counseling and coordination of care with the patient   For questions or updates, please contact [place] Please consult [email or web address] for contact info under    Signed, Ida Rogue, Mercer  07/31/2020 12:46 PM

## 2020-07-31 NOTE — Consult Note (Signed)
ANTICOAGULATION CONSULT NOTE  Pharmacy Consult for heparin Indication: chest pain/ACS  No Known Allergies  Patient Measurements: Height: 5\' 2"  (157.5 cm) Weight: 43.3 kg (95 lb 7.4 oz) IBW/kg (Calculated) : 50.1 Heparin Dosing Weight: 56.7 kg  Vital Signs: Temp: 99.5 F (37.5 C) (07/07 1600) Temp Source: Bladder (07/07 0600) BP: 131/67 (07/07 1600) Pulse Rate: 85 (07/07 1600)  Labs: Recent Labs    07/30/20 1336 07/30/20 1538 07/30/20 1617 07/30/20 1746 07/30/20 1840 07/30/20 1955 07/30/20 1957 07/30/20 2314 07/31/20 0258 07/31/20 1602  HGB 12.5  --   --   --  10.5*  --   --   --   --  10.1*  HCT 38.1  --   --   --  29.9*  --   --   --   --   --   PLT 302  --   --   --  251  --   --   --   --   --   APTT  --   --   --   --   --  >200*  --   --   --   --   LABPROT  --   --   --   --   --  14.5  --   --   --   --   INR  --   --   --   --   --  1.1  --   --   --   --   HEPARINUNFRC  --   --   --   --   --   --   --   --  0.95* 0.74*  CREATININE 2.09*  --  1.76*  --  1.61*  --   --   --  1.44*  --   TROPONINIHS  --    < >  --  792*  --   --  1,520* 2,075*  --   --    < > = values in this interval not displayed.     Estimated Creatinine Clearance: 26.6 mL/min (A) (by C-G formula based on SCr of 1.44 mg/dL (H)).   Medical History: Past Medical History:  Diagnosis Date   Bipolar 1 disorder ([place])    CAD (coronary artery disease)    Diabetes mellitus without complication ([place])    HLD (hyperlipidemia)    HTN (hypertension)     Medications:  No PTA anticoagulation or antiplatelet therapy   Assessment:  65 y.o. female with medical history significant for T1DM, CAD, bipolar disorder, and HTN who was brought into the ER by EMS for evaluation of diffuse abdominal pain and vomiting. Pt was found to have troponin elevated to 792. Pharmacy has been consulted for heparin dosing for ACS.  Hgb: 10.5; plts: 251 Heparin Dosing Weight: 56.7 kg  7/7 0258 HL 0.95,  supratherapeutic 700 > 600  7/7 1602 HL 0.74 600 > 550   Goal of Therapy:  Heparin level 0.3-0.7 units/ml Monitor platelets by anticoagulation protocol: Yes   Plan:  Heparin supratherapeutic Decrease heparin drip to 550 units/hr Recheck HL 8 hours following rate change to confirm CBC daily while on heparin drip  Dorothe Pea, PharmD, BCPS 07/31/2020,4:48 PM

## 2020-07-31 NOTE — Progress Notes (Signed)
Inpatient Diabetes Program Recommendations  AACE/ADA: New Consensus Statement on Inpatient Glycemic Control (2015)  Target Ranges:  Prepandial:   less than 140 mg/dL      Peak postprandial:   less than 180 mg/dL (1-2 hours)      Critically ill patients:  140 - 180 mg/dL   Lab Results  Component Value Date   GLUCAP 72 07/31/2020   HGBA1C 13.8 (H) 05/05/2020    Review of Glycemic Control Results for Erica Mercer, Erica Mercer (MRN [id number]) as of 07/31/2020 10:48  Ref. Range 07/30/2020 21:42 07/30/2020 22:31 07/30/2020 23:44 07/31/2020 01:45 07/31/2020 08:06  Glucose-Capillary Latest Ref Range: 70 - 99 mg/dL 188 (H) 128 (H) 97 110 (H) 72   Diabetes history: DM1 Outpatient Diabetes medications: Lantus 25 units daily, Humalog 6 units TID with meals Current orders for Inpatient glycemic control: Lantus 25 units daily, Novolog 0-15 units TID with meals, Novolog 0-5 units QHS  Inpatient Diabetes Program Recommendations:   Fasting CBG 72 this am. -Decrease Lantus to 20 units daily -Add Novolog 3 units tid meal coverage if eats 50% -Decrease Novolog correction to 0-9 units tid + hs 0-5 units Secure chat sent to Dr. Leslye Peer.  Patient and daughter spoken with by Diabetes Coordinator on 05/05/20 and discuss ways of assisting patient to remember to take her insulin.  Thank you, [name]. Dekker Verga, RN, MSN, CDE  Diabetes Coordinator Inpatient Glycemic Control Team Team Pager [phone] (8am-5pm) 07/31/2020 10:54 AM

## 2020-07-31 NOTE — Progress Notes (Signed)
Patient ID: Erica Mercer, female   DOB: 24-Sep-1955, 64 y.o.   MRN: [id number] [facility] PROGRESS NOTE  Erica Mercer SWF:[id number] DOB: [birth year] DOA: 07/30/2020 PCP: [name], NP  HPI/Subjective: Patient seen this morning and was starting to talk and move around.  Patient complained of some abdominal pain when I palpated on her abdomen.  No nausea or vomiting.  Initially admitted with DKA.  Then found to have elevated troponins.  Was put on empiric antibiotics because they suspected possible meningitis but were unable to get an LP.  Objective: Vitals:   07/31/20 1000 07/31/20 1100  BP: (!) 149/86 120/66  Pulse: 89 74  Resp: 14 14  Temp: 98.42 F (36.9 C) 98.96 F (37.2 C)  SpO2: 97% 95%    Intake/Output Summary (Last 24 hours) at 07/31/2020 1248 Last data filed at 07/31/2020 0600 Gross per 24 hour  Intake 2120.1 ml  Output 2450 ml  Net -329.9 ml   Filed Weights   07/30/20 1143 07/30/20 2145  Weight: 56.7 kg 43.3 kg    ROS: Review of Systems  Respiratory:  Negative for shortness of breath.   Cardiovascular:  Negative for chest pain.  Gastrointestinal:  Negative for abdominal pain, nausea and vomiting.  Exam: Physical Exam HENT:     Head: Normocephalic.     Mouth/Throat:     Pharynx: No oropharyngeal exudate.  Eyes:     General: Lids are normal.     Conjunctiva/sclera: Conjunctivae normal.     Pupils: Pupils are equal, round, and reactive to light.  Cardiovascular:     Rate and Rhythm: Normal rate and regular rhythm.     Heart sounds: Normal heart sounds, S1 normal and S2 normal.  Pulmonary:     Breath sounds: Examination of the right-lower field reveals decreased breath sounds. Examination of the left-lower field reveals decreased breath sounds. Decreased breath sounds present. No wheezing, rhonchi or rales.  Abdominal:     Palpations: Abdomen is soft.     Tenderness: There is no abdominal tenderness.  Musculoskeletal:     Right lower leg: No  swelling.     Left lower leg: No swelling.  Skin:    General: Skin is warm.     Findings: No rash.  Neurological:     Mental Status: She is confused.     Comments: Answers all questions and moves all extremities.     Data Reviewed: Basic Metabolic Panel: Recent Labs  Lab 07/30/20 1336 07/30/20 1617 07/30/20 1840 07/31/20 0258  NA 129* 135 141 144  K 5.9* 4.9 4.4 4.0  CL 87* 102 109 110  CO2 21* 18* 21* 26  GLUCOSE 1,188* 657* 305* 113*  BUN 55* 45* 44* 36*  CREATININE 2.09* 1.76* 1.61* 1.44*  CALCIUM 9.8 8.8* 8.7* 8.7*   Liver Function Tests: Recent Labs  Lab 07/30/20 1336  AST 87*  ALT 40  ALKPHOS 193*  BILITOT 1.6*  PROT 7.3  ALBUMIN 3.9   Recent Labs  Lab 07/30/20 1336  LIPASE 32   CBC: Recent Labs  Lab 07/30/20 1336 07/30/20 1840  WBC 6.7 12.2*  NEUTROABS 5.9  --   HGB 12.5 10.5*  HCT 38.1 29.9*  MCV 99.5 94.9  PLT 302 251     CBG: Recent Labs  Lab 07/30/20 2231 07/30/20 2344 07/31/20 0145 07/31/20 0806 07/31/20 1122  GLUCAP 128* 97 110* 72 196*    Recent Results (from the past 240 hour(s))  Culture, blood (single)     Status:  None (Preliminary result)   Collection Time: 07/30/20  3:38 PM   Specimen: BLOOD  Result Value Ref Range Status   Specimen Description BLOOD BLOOD LEFT FOREARM  Final   Special Requests   Final    BOTTLES DRAWN AEROBIC AND ANAEROBIC Blood Culture results may not be optimal due to an inadequate volume of blood received in culture bottles   Culture   Final    NO GROWTH < 24 HOURS Performed at [facility], [address], [place], [place] [address]    Report Status PENDING  Incomplete  Resp Panel by RT-PCR (Flu A&B, Covid) Nasopharyngeal Swab     Status: None   Collection Time: 07/30/20  3:58 PM   Specimen: Nasopharyngeal Swab; Nasopharyngeal(NP) swabs in vial transport medium  Result Value Ref Range Status   SARS Coronavirus 2 by RT PCR NEGATIVE NEGATIVE Final    Comment: (NOTE) SARS-CoV-2  target nucleic acids are NOT DETECTED.  The SARS-CoV-2 RNA is generally detectable in upper respiratory specimens during the acute phase of infection. The lowest concentration of SARS-CoV-2 viral copies this assay can detect is 138 copies/mL. A negative result does not preclude SARS-Cov-2 infection and should not be used as the sole basis for treatment or other patient management decisions. A negative result may occur with  improper specimen collection/handling, submission of specimen other than nasopharyngeal swab, presence of viral mutation(s) within the areas targeted by this assay, and inadequate number of viral copies(<138 copies/mL). A negative result must be combined with clinical observations, patient history, and epidemiological information. The expected result is Negative.  Fact Sheet for Patients:  EntrepreneurPulse.com.au  Fact Sheet for Healthcare Providers:  IncredibleEmployment.be  This test is no t yet approved or cleared by the [place] FDA and  has been authorized for detection and/or diagnosis of SARS-CoV-2 by FDA under an Emergency Use Authorization (EUA). This EUA will remain  in effect (meaning this test can be used) for the duration of the COVID-19 declaration under Section 564(b)(1) of the Act, 21 U.S.C.section 360bbb-3(b)(1), unless the authorization is terminated  or revoked sooner.       Influenza A by PCR NEGATIVE NEGATIVE Final   Influenza B by PCR NEGATIVE NEGATIVE Final    Comment: (NOTE) The Xpert Xpress SARS-CoV-2/FLU/RSV plus assay is intended as an aid in the diagnosis of influenza from Nasopharyngeal swab specimens and should not be used as a sole basis for treatment. Nasal washings and aspirates are unacceptable for Xpert Xpress SARS-CoV-2/FLU/RSV testing.  Fact Sheet for Patients: EntrepreneurPulse.com.au  Fact Sheet for Healthcare  Providers: IncredibleEmployment.be  This test is not yet approved or cleared by the [place] FDA and has been authorized for detection and/or diagnosis of SARS-CoV-2 by FDA under an Emergency Use Authorization (EUA). This EUA will remain in effect (meaning this test can be used) for the duration of the COVID-19 declaration under Section 564(b)(1) of the Act, 21 U.S.C. section 360bbb-3(b)(1), unless the authorization is terminated or revoked.  Performed at [facility], [address] [place], [place] [address]   MRSA Next Gen by PCR, Nasal     Status: None   Collection Time: 07/30/20  9:45 PM   Specimen: Nasal Mucosa; Nasal Swab  Result Value Ref Range Status   MRSA by PCR Next Gen NOT DETECTED NOT DETECTED Final    Comment: (NOTE) The GeneXpert MRSA Assay (FDA approved for NASAL specimens only), is one component of a comprehensive MRSA colonization surveillance program. It is not intended to  diagnose MRSA infection nor to guide or monitor treatment for MRSA infections. Test performance is not FDA approved in patients less than 53 years old. Performed at [facility], [address] [place], [place] [address]      Studies: CT ABDOMEN PELVIS WO CONTRAST  Result Date: 07/30/2020 CLINICAL DATA:  Abdominal pain EXAM: CT ABDOMEN AND PELVIS WITHOUT CONTRAST TECHNIQUE: Multidetector CT imaging of the abdomen and pelvis was performed following the standard protocol without IV contrast. COMPARISON:  05/21/2020 FINDINGS: Motion and streak artifact are present. Findings below are within this limitation. Lower chest: No acute abnormality. Hepatobiliary: No focal liver abnormality is seen. No gallstones, gallbladder wall thickening, or biliary dilatation. Pancreas: Unremarkable. Spleen: Unremarkable. Adrenals/Urinary Tract: Small bilateral nonobstructing renal calculi as before. Largest measures 4 mm on the right. Extrarenal pelvis on the left. No  hydronephrosis. Bladder is distended. Stomach/Bowel: Stomach is unremarkable. Bowel is normal in caliber. Stool is present throughout the colon. Vascular/Lymphatic: Aortic atherosclerosis. No enlarged lymph nodes are identified. Reproductive: Calcified uterine fibroids. Other: No free fluid.  No acute abnormality of the abdominal wall. Musculoskeletal: No acute osseous abnormality. Right femoral head avascular necrosis. IMPRESSION: Distended bladder. Nonobstructing small renal calculi. Calcified uterine fibroids. Aortic atherosclerosis. Right femoral head AVN. Electronically Signed   By: Macy Mis M.D.   On: 07/30/2020 15:37   CT Head Wo Contrast  Result Date: 07/30/2020 CLINICAL DATA:  Mental status change. Vomiting and headache. Hyperglycemia. EXAM: CT HEAD WITHOUT CONTRAST TECHNIQUE: Contiguous axial images were obtained from the base of the skull through the vertex without intravenous contrast. COMPARISON:  Head CT 07/18/2020 and MRI 05/05/2020 FINDINGS: Brain: There is no evidence of an acute infarct, intracranial hemorrhage, mass, midline shift, or extra-axial fluid collection. The ventricles and sulci are within normal limits for age. Patchy hypodensities in the cerebral white matter bilaterally are unchanged and nonspecific but compatible with mild-to-moderate chronic small vessel ischemic disease. Vascular: Calcified atherosclerosis at the skull base. No hyperdense vessel. Skull: No fracture or suspicious osseous lesion. Sinuses/Orbits: Visualized paranasal sinuses and mastoid air cells are clear. Visualized orbits are unremarkable. Other: None. IMPRESSION: 1. No evidence of acute intracranial abnormality. 2. Mild-to-moderate chronic small vessel ischemic disease. Electronically Signed   By: Logan Bores M.D.   On: 07/30/2020 15:31   DG Chest Portable 1 View  Result Date: 07/30/2020 CLINICAL DATA:  Sepsis. EXAM: PORTABLE CHEST 1 VIEW COMPARISON:  04/30/2020 FINDINGS: Mild cardiomegaly is  unchanged from prior exam. No focal airspace disease. No pleural effusion or pneumothorax. Rounded calcification in the right axilla is unchanged. No acute osseous abnormalities are seen. IMPRESSION: Stable mild cardiomegaly. No acute chest findings. Electronically Signed   By: Keith Rake M.D.   On: 07/30/2020 18:16    Scheduled Meds:  amLODipine  10 mg Oral Daily   Chlorhexidine Gluconate Cloth  6 each Topical Daily   insulin aspart  0-15 Units Subcutaneous TID WC   insulin aspart  3 Units Subcutaneous TID WC   insulin glargine  20 Units Subcutaneous Q24H   losartan  50 mg Oral Daily   mouth rinse  15 mL Mouth Rinse BID    morphine injection  2 mg Intravenous Once   pantoprazole (PROTONIX) IV  40 mg Intravenous Q12H   Continuous Infusions:  ampicillin (OMNIPEN) IV 300 mL/hr at 07/31/20 0600   cefTRIAXone (ROCEPHIN)  IV 2 g (07/31/20 0957)   heparin 600 Units/hr (07/31/20 0809)   vancomycin      Assessment/Plan:  Diabetic ketoacidosis.  Patient now on glargine insulin and aspart insulin prior to meals. Elevated troponin suspicious for NSTEMI.  On IV heparin drip.  Echocardiogram done but still not read yet.  Cardiology consultation. Acute metabolic encephalopathy and fever.  ER physician unable to get an LP.  Now that she is on a heparin drip I do not want to get an LP at this point.  Consulted infectious disease.  Patient did not have any meningeal signs when I saw her this morning.  Hopefully will be able to downgrade antibiotics. Acute kidney injury and hyponatremia secondary to diabetic ketoacidosis.  Creatinine improved from 2.09 down to 1.44.  Gentle IV fluid hydration. Lactic acidosis.  Gentle IV fluid hydration Essential hypertension on amlodipine and losartan Hematemesis.  Patient on IV Protonix.  Continue to watch hemoglobin closely while on heparin drip. Hyperkalemia initially improved with IV fluids.       Code Status:     Code Status Orders  (From admission,  onward)           Start     Ordered   07/30/20 1547  Full code  Continuous        07/30/20 1547           Code Status History     Date Active Date Inactive Code Status Order ID Comments User Context   05/21/2020 1351 05/22/2020 1742 Full Code [id number]  Kayleen Memos, DO ED   05/05/2020 1356 05/07/2020 1708 Full Code [id number]  Ivor Costa, MD ED   04/30/2020 1245 05/01/2020 2012 Full Code [id number]  CoxBriant Cedar, DO ED   02/25/2020 1649 02/27/2020 2115 DNR [id number]  [facility], Briant Cedar, DO ED      Family Communication: Spoke with patient's daughter on the phone Disposition Plan: Status is: Inpatient  Dispo: The patient is from: Home              Anticipated d/c is to: Home              Patient currently with resolving DKA, rising troponins, improving mental status and on antibiotics for fever.   Difficult to place patient.  No.  Consultants: Infectious disease Cardiology  Antibiotics: Currently on Rocephin, vancomycin and ampicillin  Time spent: 29 minutes  [place]

## 2020-07-31 NOTE — Consult Note (Signed)
NAME: Erica Mercer  DOB: 1955/12/20  MRN: [id number]  Date/Time: 07/31/2020 3:23 PM  REQUESTING PROVIDER: Dr.Wieting Subjective:  REASON FOR CONSULT: meningitis ? Erica Mercer is a 65 y.o. female with a history of DM, HTN, CAD, bipolar disorder presented from home via EMS on 07/30/20 with headache, nausea and vomiting and Blood glucose reading HI. As per patient she does not remember what happened yesterday- she states she lives alone and non compliant with meds- Says she has had diabetic coma before.In the ED she apparently had bloody emesis, c/o abdominal pain. She was agitated and had to receive IV ativan Vitals BP 218/118, temp 98.9, HR 133, Sats 98%. Labs wbc 6.7, HB 12.5, PLT 302 NA 129, K 5.9, Bld glucose 1188, cr 2.09, lactate 6.9, troponin was 91 , AG 21. She was admitted to ICU as DKA and started on IV fluidsm insulin drip . As she developed a fever of 103, she wa sstarted on meningitis protocol. Her troponins increased to more than 1000 and she was started on heparin drip. I am asked to see whether she has  meningitis and for antibiotic management. CT head done yesterday No evidence of acute intracranial abnormality. 2. Mild-to-moderate chronic small vessel ischemic disease. CT abdomen showed small b/l non obstructing renal calculi. Pt is awake and alert and says she is feeling better  Past Medical History:  Diagnosis Date   Bipolar 1 disorder ([place])    CAD (coronary artery disease)    Diabetes mellitus without complication ([place])    HLD (hyperlipidemia)    HTN (hypertension)     Past Surgical History:  Procedure Laterality Date   COLONOSCOPY     ESOPHAGOGASTRODUODENOSCOPY      Social History   Socioeconomic History   Marital status: Single    Spouse name: Not on file   Number of children: Not on file   Years of education: Not on file   Highest education level: Not on file  Occupational History   Not on file  Tobacco Use   Smoking status: Every Day    Pack years:  0.00   Smokeless tobacco: Never  Vaping Use   Vaping Use: Never used  Substance and Sexual Activity   Alcohol use: Not Currently   Drug use: Yes    Frequency: 3.0 times per week    Types: Marijuana   Sexual activity: Not Currently  Other Topics Concern   Not on file  Social History Narrative   Not on file   Social Determinants of Health   Financial Resource Strain: Not on file  Food Insecurity: Not on file  Transportation Needs: Not on file  Physical Activity: Not on file  Stress: Not on file  Social Connections: Not on file  Intimate Partner Violence: Not on file    Family History  Problem Relation Age of Onset   Heart disease Mother    Prostate cancer Father    No Known Allergies I? Current Facility-Administered Medications  Medication Dose Route Frequency Provider Last Rate Last Admin   amLODipine (NORVASC) tablet 10 mg  10 mg Oral Daily Wieting, Richard, MD   10 mg at 07/31/20 0918   ampicillin (OMNIPEN) 2 g in sodium chloride 0.9 % 100 mL IVPB  2 g Intravenous Q8H Virl Cagey E, RPH 300 mL/hr at 07/31/20 1428 2 g at 07/31/20 1428   [START ON 08/01/2020] aspirin EC tablet 81 mg  81 mg Oral Daily Gollan, Kathlene November, MD       cefTRIAXone (ROCEPHIN)  2 g in sodium chloride 0.9 % 100 mL IVPB  2 g Intravenous Q12H Agbata, Tochukwu, MD 200 mL/hr at 07/31/20 0957 2 g at 07/31/20 0957   Chlorhexidine Gluconate Cloth 2 % PADS 6 each  6 each Topical Daily Agbata, Tochukwu, MD   6 each at 07/30/20 2147   dextrose 50 % solution 0-50 mL  0-50 mL Intravenous PRN Agbata, Tochukwu, MD       heparin ADULT infusion 100 units/mL (25000 units/240mL)  600 Units/hr Intravenous Continuous Loletha Grayer, MD 6 mL/hr at 07/31/20 0809 600 Units/hr at 07/31/20 0809   insulin aspart (novoLOG) injection 0-15 Units  0-15 Units Subcutaneous TID WC Smith, Rondell A, MD   3 Units at 07/31/20 1250   insulin aspart (novoLOG) injection 3 Units  3 Units Subcutaneous TID WC Loletha Grayer, MD   3 Units at  07/31/20 1250   insulin glargine (LANTUS) injection 20 Units  20 Units Subcutaneous Q24H Wieting, Richard, MD       labetalol (NORMODYNE) injection 5 mg  5 mg Intravenous Q2H PRN Loletha Grayer, MD   5 mg at 07/31/20 0958   losartan (COZAAR) tablet 50 mg  50 mg Oral Daily Loletha Grayer, MD   50 mg at 07/31/20 6712   MEDLINE mouth rinse  15 mL Mouth Rinse BID Agbata, Tochukwu, MD   15 mL at 07/31/20 0919   metoprolol tartrate (LOPRESSOR) tablet 25 mg  25 mg Oral BID Minna Merritts, MD       morphine 2 MG/ML injection 2 mg  2 mg Intravenous Once Nena Polio, MD       pantoprazole (PROTONIX) injection 40 mg  40 mg Intravenous Q12H Agbata, Tochukwu, MD   40 mg at 07/31/20 0918   vancomycin (VANCOREADY) IVPB 500 mg/100 mL  500 mg Intravenous Q24H Wieting, Richard, MD         Abtx:  Anti-infectives (From admission, onward)    Start     Dose/Rate Route Frequency Ordered Stop   07/31/20 1800  vancomycin (VANCOREADY) IVPB 500 mg/100 mL        500 mg 100 mL/hr over 60 Minutes Intravenous Every 24 hours 07/31/20 0858     07/31/20 0600  ampicillin (OMNIPEN) 2 g in sodium chloride 0.9 % 100 mL IVPB        2 g 300 mL/hr over 20 Minutes Intravenous Every 8 hours 07/30/20 1926     07/30/20 2200  cefTRIAXone (ROCEPHIN) 2 g in sodium chloride 0.9 % 100 mL IVPB        2 g 200 mL/hr over 30 Minutes Intravenous Every 12 hours 07/30/20 1716     07/30/20 2117  vancomycin variable dose per unstable renal function (pharmacist dosing)  Status:  Discontinued         Does not apply See admin instructions 07/30/20 2119 07/31/20 0858   07/30/20 2000  ampicillin (OMNIPEN) 2 g in sodium chloride 0.9 % 100 mL IVPB  Status:  Discontinued        2 g 300 mL/hr over 20 Minutes Intravenous Every 6 hours 07/30/20 1830 07/30/20 1926   07/30/20 1800  ampicillin (OMNIPEN) 2 g in sodium chloride 0.9 % 100 mL IVPB  Status:  Discontinued        2 g 300 mL/hr over 20 Minutes Intravenous Every 4 hours 07/30/20 1716  07/30/20 1830   07/30/20 1600  vancomycin (VANCOREADY) IVPB 1250 mg/250 mL        1,250 mg 166.7 mL/hr over  90 Minutes Intravenous  Once 07/30/20 1541 07/30/20 1921   07/30/20 1545  ceFEPIme (MAXIPIME) 2 g in sodium chloride 0.9 % 100 mL IVPB        2 g 200 mL/hr over 30 Minutes Intravenous  Once 07/30/20 1535 07/30/20 1635   07/30/20 1545  metroNIDAZOLE (FLAGYL) IVPB 500 mg        500 mg 100 mL/hr over 60 Minutes Intravenous  Once 07/30/20 1535 07/30/20 1745   07/30/20 1545  vancomycin (VANCOCIN) IVPB 1000 mg/200 mL premix  Status:  Discontinued        1,000 mg 200 mL/hr over 60 Minutes Intravenous  Once 07/30/20 1535 07/30/20 1541       REVIEW OF SYSTEMS:  Const: negative fever, negative chills, beofre hospitalization Eyes: negative diplopia or visual changes, negative eye pain ENT: negative coryza, negative sore throat Resp: negative cough, hemoptysis, dyspnea Cards: negative for chest pain, palpitations, lower extremity edema GU: negative for frequency, dysuria and hematuria GI: has  abdominal pain, nausea and vomiting, no diarrhea, bleeding, constipation Skin: negative for rash and pruritus Heme: negative for easy bruising and gum/nose bleeding MS: negative for myalgias, arthralgias, back pain and muscle weakness Neurolo: headaches, confusion Psych: bipolar  Endocrine: , diabetes Allergy/Immunology- negative for any medication or food allergies ?  Objective:  VITALS:  BP 132/63   Pulse 82   Temp 99.14 F (37.3 C)   Resp 18   Ht 5\' 2"  (1.575 m)   Wt 43.3 kg   SpO2 97%   BMI 17.46 kg/m  PHYSICAL EXAM:  General: Alert, cooperative, no distress, oriented X 4 Head: Normocephalic, without obvious abnormality, atraumatic. Eyes: Conjunctivae clear, anicteric sclerae. Pupils are equal ENT Nares normal. No drainage or sinus tenderness. Lips, mucosa, and tongue normal. No Thrush Neck: Supple, symmetrical, no adenopathy, thyroid: non tender no carotid bruit and no  JVD. Back: No CVA tenderness. Lungs: Clear to auscultation bilaterally. No Wheezing or Rhonchi. No rales. Heart: Regular rate and rhythm, no murmur, rub or gallop. Abdomen: Soft, non-tender,not distended. Bowel sounds normal. No masses Extremities: atraumatic, no cyanosis. No edema. No clubbing Skin: No rashes or lesions. Or bruising Lymph: Cervical, supraclavicular normal. Neurologic: Grossly non-focal Pertinent Labs Lab Results CBC    Component Value Date/Time   WBC 12.2 (H) 07/30/2020 1840   RBC 3.15 (L) 07/30/2020 1840   HGB 10.5 (L) 07/30/2020 1840   HCT 29.9 (L) 07/30/2020 1840   PLT 251 07/30/2020 1840   MCV 94.9 07/30/2020 1840   MCH 33.3 07/30/2020 1840   MCHC 35.1 07/30/2020 1840   RDW 12.7 07/30/2020 1840   LYMPHSABS 0.6 (L) 07/30/2020 1336   MONOABS 0.2 07/30/2020 1336   EOSABS 0.0 07/30/2020 1336   BASOSABS 0.0 07/30/2020 1336    CMP Latest Ref Rng & Units 07/31/2020 07/30/2020 07/30/2020  Glucose 70 - 99 mg/dL 113(H) 305(H) 657(HH)  BUN 8 - 23 mg/dL 36(H) 44(H) 45(H)  Creatinine 0.44 - 1.00 mg/dL 1.44(H) 1.61(H) 1.76(H)  Sodium 135 - 145 mmol/L 144 141 135  Potassium 3.5 - 5.1 mmol/L 4.0 4.4 4.9  Chloride 98 - 111 mmol/L 110 109 102  CO2 22 - 32 mmol/L 26 21(L) 18(L)  Calcium 8.9 - 10.3 mg/dL 8.7(L) 8.7(L) 8.8(L)  Total Protein 6.5 - 8.1 g/dL - - -  Total Bilirubin 0.3 - 1.2 mg/dL - - -  Alkaline Phos 38 - 126 U/L - - -  AST 15 - 41 U/L - - -  ALT 0 - 44 U/L - - -  Microbiology: Recent Results (from the past 240 hour(s))  Culture, blood (single)     Status: None (Preliminary result)   Collection Time: 07/30/20  3:38 PM   Specimen: BLOOD  Result Value Ref Range Status   Specimen Description BLOOD BLOOD LEFT FOREARM  Final   Special Requests   Final    BOTTLES DRAWN AEROBIC AND ANAEROBIC Blood Culture results may not be optimal due to an inadequate volume of blood received in culture bottles   Culture   Final    NO GROWTH < 24 HOURS Performed at  [facility], [address] [place], [place] [address]    Report Status PENDING  Incomplete  Resp Panel by RT-PCR (Flu A&B, Covid) Nasopharyngeal Swab     Status: None   Collection Time: 07/30/20  3:58 PM   Specimen: Nasopharyngeal Swab; Nasopharyngeal(NP) swabs in vial transport medium  Result Value Ref Range Status   SARS Coronavirus 2 by RT PCR NEGATIVE NEGATIVE Final    Comment: (NOTE) SARS-CoV-2 target nucleic acids are NOT DETECTED.  The SARS-CoV-2 RNA is generally detectable in upper respiratory specimens during the acute phase of infection. The lowest concentration of SARS-CoV-2 viral copies this assay can detect is 138 copies/mL. A negative result does not preclude SARS-Cov-2 infection and should not be used as the sole basis for treatment or other patient management decisions. A negative result may occur with  improper specimen collection/handling, submission of specimen other than nasopharyngeal swab, presence of viral mutation(s) within the areas targeted by this assay, and inadequate number of viral copies(<138 copies/mL). A negative result must be combined with clinical observations, patient history, and epidemiological information. The expected result is Negative.  Fact Sheet for Patients:  EntrepreneurPulse.com.au  Fact Sheet for Healthcare Providers:  IncredibleEmployment.be  This test is no t yet approved or cleared by the [place] FDA and  has been authorized for detection and/or diagnosis of SARS-CoV-2 by FDA under an Emergency Use Authorization (EUA). This EUA will remain  in effect (meaning this test can be used) for the duration of the COVID-19 declaration under Section 564(b)(1) of the Act, 21 U.S.C.section 360bbb-3(b)(1), unless the authorization is terminated  or revoked sooner.       Influenza A by PCR NEGATIVE NEGATIVE Final   Influenza B by PCR NEGATIVE NEGATIVE Final    Comment: (NOTE) The  Xpert Xpress SARS-CoV-2/FLU/RSV plus assay is intended as an aid in the diagnosis of influenza from Nasopharyngeal swab specimens and should not be used as a sole basis for treatment. Nasal washings and aspirates are unacceptable for Xpert Xpress SARS-CoV-2/FLU/RSV testing.  Fact Sheet for Patients: EntrepreneurPulse.com.au  Fact Sheet for Healthcare Providers: IncredibleEmployment.be  This test is not yet approved or cleared by the [place] FDA and has been authorized for detection and/or diagnosis of SARS-CoV-2 by FDA under an Emergency Use Authorization (EUA). This EUA will remain in effect (meaning this test can be used) for the duration of the COVID-19 declaration under Section 564(b)(1) of the Act, 21 U.S.C. section 360bbb-3(b)(1), unless the authorization is terminated or revoked.  Performed at [facility], [address] [place], [place] [address]   MRSA Next Gen by PCR, Nasal     Status: None   Collection Time: 07/30/20  9:45 PM   Specimen: Nasal Mucosa; Nasal Swab  Result Value Ref Range Status   MRSA by PCR Next Gen NOT DETECTED NOT DETECTED Final    Comment: (NOTE) The GeneXpert MRSA Assay (FDA approved for NASAL  specimens only), is one component of a comprehensive MRSA colonization surveillance program. It is not intended to diagnose MRSA infection nor to guide or monitor treatment for MRSA infections. Test performance is not FDA approved in patients less than 103 years old. Performed at [facility], [address] [place], [place] [address]     IMAGING RESULTS: CT head no acute findings  I have personally reviewed the films ? Impression/Recommendation ? DKA with  metabolic encephalopathy Improved with Fluids and insulin  Clinically no evidence of meningitis'will stop vanco/ampcilin and change ceftriazone to qd dose May stop that tomorrow if Blood culture remains negative  Fever - not  meningitis R/o aspiration Deescalate to ceftriaxone- if no fever /blood culture neg tomorrow , can then DC antibiotics  HTN-on amlodipine, metoprolol and losartan ? _AKI on CKD due to dehydration Pt has foley for possible urinary retention- consider removing foley as she is fully awake __________________________________________________ Discussed with patient, requesting provider Note:  This document was prepared using Dragon voice recognition software and may include unintentional dictation errors.

## 2020-07-31 NOTE — Consult Note (Signed)
Pharmacy Antibiotic Note  Erica Mercer is a 65 y.o. female admitted on 07/30/2020 with concern for meningitis with AMS and fever (tmax 103). Pharmacy has been consulted for Vancomycin dosing. Patient is also on ampicillin and ceftriaxone. ID has been consulted.  Plan: Creatinine has improved.  Start vancomycin 500 mg q24h for goal trough 15-20 mcg/ml for meningitis  Continue ampicillin and ceftriaxone.  Follow ID recommendations for antibiotics.  Height: 5\' 2"  (157.5 cm) Weight: 43.3 kg (95 lb 7.4 oz) IBW/kg (Calculated) : 50.1  Temp (24hrs), Avg:100.9 F (38.3 C), Min:98.42 F (36.9 C), Max:103.7 F (39.8 C)  Recent Labs  Lab 07/30/20 1336 07/30/20 1617 07/30/20 1746 07/30/20 1840 07/30/20 1955 07/30/20 2314 07/31/20 0258  WBC 6.7  --   --  12.2*  --   --   --   CREATININE 2.09* 1.76*  --  1.61*  --   --  1.44*  LATICACIDVEN  --   --  6.9*  --  3.1* 2.2* 1.2     Estimated Creatinine Clearance: 26.6 mL/min (A) (by C-G formula based on SCr of 1.44 mg/dL (H)).    No Known Allergies  Antimicrobials this admission: Vancomycin 7/06 >>  Ceftriaxone 7/06 >>  Ampicillin 7/06 >>   Microbiology results: 7/06 BCx: single set - NG 7/06 MRSA PCR: negative  Thank you for allowing pharmacy to be a part of this patient's care.  Tawnya Crook, PharmD 07/31/2020 1:30 PM

## 2020-07-31 NOTE — Progress Notes (Signed)
*  PRELIMINARY RESULTS* Echocardiogram 2D Echocardiogram has been performed.  Erica Mercer 07/31/2020, 12:28 PM

## 2020-08-01 DIAGNOSIS — E875 Hyperkalemia: Secondary | ICD-10-CM

## 2020-08-01 DIAGNOSIS — R7989 Other specified abnormal findings of blood chemistry: Secondary | ICD-10-CM

## 2020-08-01 DIAGNOSIS — R9431 Abnormal electrocardiogram [ECG] [EKG]: Secondary | ICD-10-CM

## 2020-08-01 DIAGNOSIS — E1011 Type 1 diabetes mellitus with ketoacidosis with coma: Secondary | ICD-10-CM

## 2020-08-01 DIAGNOSIS — R778 Other specified abnormalities of plasma proteins: Secondary | ICD-10-CM

## 2020-08-01 DIAGNOSIS — Z794 Long term (current) use of insulin: Secondary | ICD-10-CM

## 2020-08-01 DIAGNOSIS — R531 Weakness: Secondary | ICD-10-CM

## 2020-08-01 DIAGNOSIS — E11649 Type 2 diabetes mellitus with hypoglycemia without coma: Secondary | ICD-10-CM

## 2020-08-01 LAB — BASIC METABOLIC PANEL
Anion gap: 7 (ref 5–15)
BUN: 25 mg/dL — ABNORMAL HIGH (ref 8–23)
CO2: 24 mmol/L (ref 22–32)
Calcium: 8.4 mg/dL — ABNORMAL LOW (ref 8.9–10.3)
Chloride: 106 mmol/L (ref 98–111)
Creatinine, Ser: 1.51 mg/dL — ABNORMAL HIGH (ref 0.44–1.00)
GFR, Estimated: 38 mL/min — ABNORMAL LOW (ref >60)
Glucose, Bld: 284 mg/dL — ABNORMAL HIGH (ref 70–99)
Potassium: 3.5 mmol/L (ref 3.5–5.1)
Sodium: 137 mmol/L (ref 135–145)

## 2020-08-01 LAB — GLUCOSE, CAPILLARY
Glucose-Capillary: 146 mg/dL — ABNORMAL HIGH (ref 70–99)
Glucose-Capillary: 35 mg/dL — CL (ref 70–99)
Glucose-Capillary: 140 mg/dL — ABNORMAL HIGH (ref 70–99)
Glucose-Capillary: 270 mg/dL — ABNORMAL HIGH (ref 70–99)
Glucose-Capillary: 377 mg/dL — ABNORMAL HIGH (ref 70–99)
Glucose-Capillary: 461 mg/dL — ABNORMAL HIGH (ref 70–99)

## 2020-08-01 LAB — CBC
HCT: 30.6 % — ABNORMAL LOW (ref 36.0–46.0)
Hemoglobin: 10.6 g/dL — ABNORMAL LOW (ref 12.0–15.0)
MCH: 32.7 pg (ref 26.0–34.0)
MCHC: 34.6 g/dL (ref 30.0–36.0)
MCV: 94.4 fL (ref 80.0–100.0)
Platelets: 220 10*3/uL (ref 150–400)
RBC: 3.24 MIL/uL — ABNORMAL LOW (ref 3.87–5.11)
RDW: 13.2 % (ref 11.5–15.5)
WBC: 11.4 10*3/uL — ABNORMAL HIGH (ref 4.0–10.5)
nRBC: 0 % (ref 0.0–0.2)

## 2020-08-01 LAB — HEMOGLOBIN A1C
Hgb A1c MFr Bld: 14.8 % — ABNORMAL HIGH (ref 4.8–5.6)
Mean Plasma Glucose: 378 mg/dL

## 2020-08-01 LAB — TSH: TSH: 1.432 u[IU]/mL (ref 0.350–4.500)

## 2020-08-01 LAB — TROPONIN I (HIGH SENSITIVITY): Troponin I (High Sensitivity): 457 ng/L (ref <18)

## 2020-08-01 LAB — HEPARIN LEVEL (UNFRACTIONATED)
Heparin Unfractionated: 0.52 IU/mL (ref 0.30–0.70)
Heparin Unfractionated: 0.46 IU/mL (ref 0.30–0.70)

## 2020-08-01 MED ORDER — ACETAMINOPHEN 325 MG PO TABS: 650.0000 mg | ORAL_TABLET | Freq: Four times a day (QID) | ORAL | Status: DC | PRN

## 2020-08-01 MED ORDER — INSULIN GLARGINE 100 UNIT/ML ~~LOC~~ SOLN
10.0000 [IU] | SUBCUTANEOUS | Status: DC
  Administered 2020-08-01: 10 [IU] via SUBCUTANEOUS
  Filled 2020-08-01: qty 0.1

## 2020-08-01 MED ORDER — HYDROCODONE-ACETAMINOPHEN 5-325 MG PO TABS
1.0000 | ORAL_TABLET | Freq: Four times a day (QID) | ORAL | Status: AC | PRN
  Administered 2020-08-01 (×2): 1 via ORAL
  Filled 2020-08-01 (×2): qty 1

## 2020-08-01 MED ORDER — INSULIN GLARGINE 100 UNIT/ML ~~LOC~~ SOLN
15.0000 [IU] | SUBCUTANEOUS | Status: DC
  Filled 2020-08-01: qty 0.15

## 2020-08-01 MED ORDER — ENOXAPARIN SODIUM 30 MG/0.3ML IJ SOSY
30.0000 mg | PREFILLED_SYRINGE | INTRAMUSCULAR | Status: DC
  Administered 2020-08-02: 30 mg via SUBCUTANEOUS
  Filled 2020-08-01: qty 0.3

## 2020-08-01 MED ORDER — ATORVASTATIN CALCIUM 20 MG PO TABS
40.0000 mg | ORAL_TABLET | Freq: Every day | ORAL | Status: DC
  Administered 2020-08-01 – 2020-08-02 (×2): 40 mg via ORAL
  Filled 2020-08-01 (×2): qty 2

## 2020-08-01 MED ORDER — IPRATROPIUM-ALBUTEROL 0.5-2.5 (3) MG/3ML IN SOLN
3.0000 mL | Freq: Four times a day (QID) | RESPIRATORY_TRACT | Status: DC
  Administered 2020-08-01 – 2020-08-02 (×3): 3 mL via RESPIRATORY_TRACT
  Filled 2020-08-01 (×4): qty 3

## 2020-08-01 MED ORDER — PANTOPRAZOLE SODIUM 40 MG PO TBEC
40.0000 mg | DELAYED_RELEASE_TABLET | Freq: Two times a day (BID) | ORAL | Status: DC
  Administered 2020-08-01 – 2020-08-03 (×4): 40 mg via ORAL
  Filled 2020-08-01 (×4): qty 1

## 2020-08-01 NOTE — Consult Note (Signed)
ANTICOAGULATION CONSULT NOTE  Pharmacy Consult for heparin Indication: chest pain/ACS  Patient Measurements: Heparin Dosing Weight: 56.7 kg  Labs: Recent Labs    07/30/20 1336 07/30/20 1538 07/30/20 1840 07/30/20 1955 07/30/20 1957 07/30/20 2314 07/31/20 0258 07/31/20 0258 07/31/20 1602 08/01/20 0126 08/01/20 0932  HGB 12.5  --  10.5*  --   --   --   --   --  10.1* 10.6*  --   HCT 38.1  --  29.9*  --   --   --   --   --   --  30.6*  --   PLT 302  --  251  --   --   --   --   --   --  220  --   APTT  --   --   --  >200*  --   --   --   --   --   --   --   LABPROT  --   --   --  14.5  --   --   --   --   --   --   --   INR  --   --   --  1.1  --   --   --   --   --   --   --   HEPARINUNFRC  --   --   --   --   --   --  0.95*   < > 0.74* 0.52 0.46  CREATININE 2.09*   < > 1.61*  --   --   --  1.44*  --   --  1.51*  --   TROPONINIHS  --    < >  --   --  1,520* 2,075*  --   --   --   --  457*   < > = values in this interval not displayed.     Estimated Creatinine Clearance: 25.4 mL/min (A) (by C-G formula based on SCr of 1.51 mg/dL (H)).   Medical History: Past Medical History:  Diagnosis Date   Bipolar 1 disorder ([place])    CAD (coronary artery disease)    Diabetes mellitus without complication ([place])    HLD (hyperlipidemia)    HTN (hypertension)     Medications:  No PTA anticoagulation or antiplatelet therapy   Assessment:  65 y.o. female with medical history significant for T1DM, CAD, bipolar disorder, and HTN who was brought into the ER by EMS for evaluation of diffuse abdominal pain and vomiting. Pt was found to have troponin elevated to 792. Pharmacy has been consulted for heparin dosing for ACS.  Hgb: 10.5; plts: 251 Heparin Dosing Weight: 56.7 kg  7/7 0258 HL 0.95, supratherapeutic 700 > 600  7/7 1602 HL 0.74 600 > 550  7/8 0126 HL 0.52, therapeutic at 550 7/8 0932 HL 0.46, therapeutic at 550  Goal of Therapy:  Heparin level 0.3-0.7 units/ml Monitor  platelets by anticoagulation protocol: Yes   Plan:  --Heparin level is therapeutic x 2 --Continue heparin drip at 550 units/hr --Heparin drip to stop after 48h at 2000 tonight --No further levels indicated at this time --CBC as ordered by physician   Benita Gutter 08/01/2020,11:19 AM

## 2020-08-01 NOTE — Progress Notes (Addendum)
Progress Note  Patient Name: Erica Mercer Date of Encounter: 08/01/2020  [facility] Cardiologist: Lynden Ang  Subjective   Patient more awake today, complaining only of back pain.  No chest pain or shortness of breath.  Feels like legs have been swollen.  Admits to missing medications at home.  Inpatient Medications    Scheduled Meds:  amLODipine  10 mg Oral Daily   aspirin EC  81 mg Oral Daily   Chlorhexidine Gluconate Cloth  6 each Topical Daily   insulin aspart  0-15 Units Subcutaneous TID WC   insulin aspart  3 Units Subcutaneous TID WC   insulin glargine  20 Units Subcutaneous Q24H   losartan  50 mg Oral Daily   mouth rinse  15 mL Mouth Rinse BID   metoprolol tartrate  25 mg Oral BID   pantoprazole (PROTONIX) IV  40 mg Intravenous Q12H   Continuous Infusions:  cefTRIAXone (ROCEPHIN)  IV     heparin 550 Units/hr (08/01/20 0700)   PRN Meds: acetaminophen, dextrose, HYDROcodone-acetaminophen, labetalol   Vital Signs    Vitals:   08/01/20 0400 08/01/20 0500 08/01/20 0600 08/01/20 0700  BP: (!) 159/98 (!) 172/84 123/69 (!) 148/90  Pulse: 70 63 61 69  Resp: 13 15 15 19   Temp: 99.5 F (37.5 C) 98.96 F (37.2 C) 99.14 F (37.3 C)   TempSrc: Bladder Bladder Bladder   SpO2: 99% 99% 94% 98%  Weight:      Height:        Intake/Output Summary (Last 24 hours) at 08/01/2020 0816 Last data filed at 08/01/2020 0400 Gross per 24 hour  Intake 223.52 ml  Output 1600 ml  Net -1376.48 ml   Last 3 Weights 07/30/2020 07/30/2020 07/18/2020  Weight (lbs) 95 lb 7.4 oz 125 lb 105 lb  Weight (kg) 43.3 kg 56.7 kg 47.628 kg      Telemetry    NSR - Personally Reviewed  ECG    No new tracings  Physical Exam   GEN: No acute distress.   Neck: No JVD Cardiac: RRR with 1/6 systolic murmur. Respiratory: Clear to auscultation bilaterally. GI: Soft, nontender, non-distended  MS: No edema; No deformity. Neuro:  Nonfocal  Psych: Normal affect   Labs    High Sensitivity  Troponin:   Recent Labs  Lab 07/30/20 1538 07/30/20 1746 07/30/20 1957 07/30/20 2314  TROPONINIHS 91* 792* 1,520* 2,075*      Chemistry Recent Labs  Lab 07/30/20 1336 07/30/20 1617 07/30/20 1840 07/31/20 0258 08/01/20 0126  NA 129*   < > 141 144 137  K 5.9*   < > 4.4 4.0 3.5  CL 87*   < > 109 110 106  CO2 21*   < > 21* 26 24  GLUCOSE 1,188*   < > 305* 113* 284*  BUN 55*   < > 44* 36* 25*  CREATININE 2.09*   < > 1.61* 1.44* 1.51*  CALCIUM 9.8   < > 8.7* 8.7* 8.4*  PROT 7.3  --   --   --   --   ALBUMIN 3.9  --   --   --   --   AST 87*  --   --   --   --   ALT 40  --   --   --   --   ALKPHOS 193*  --   --   --   --   BILITOT 1.6*  --   --   --   --  GFRNONAA 26*   < > 35* 40* 38*  ANIONGAP 21*   < > 11 8 7    < > = values in this interval not displayed.     Hematology Recent Labs  Lab 07/30/20 1336 07/30/20 1840 07/31/20 1602 08/01/20 0126  WBC 6.7 12.2*  --  11.4*  RBC 3.83* 3.15*  --  3.24*  HGB 12.5 10.5* 10.1* 10.6*  HCT 38.1 29.9*  --  30.6*  MCV 99.5 94.9  --  94.4  MCH 32.6 33.3  --  32.7  MCHC 32.8 35.1  --  34.6  RDW 13.1 12.7  --  13.2  PLT 302 251  --  220    BNPNo results for input(s): BNP, PROBNP in the last 168 hours.   DDimer No results for input(s): DDIMER in the last 168 hours.   Radiology    CT ABDOMEN PELVIS WO CONTRAST  Result Date: 07/30/2020 CLINICAL DATA:  Abdominal pain EXAM: CT ABDOMEN AND PELVIS WITHOUT CONTRAST TECHNIQUE: Multidetector CT imaging of the abdomen and pelvis was performed following the standard protocol without IV contrast. COMPARISON:  05/21/2020 FINDINGS: Motion and streak artifact are present. Findings below are within this limitation. Lower chest: No acute abnormality. Hepatobiliary: No focal liver abnormality is seen. No gallstones, gallbladder wall thickening, or biliary dilatation. Pancreas: Unremarkable. Spleen: Unremarkable. Adrenals/Urinary Tract: Small bilateral nonobstructing renal calculi as before.  Largest measures 4 mm on the right. Extrarenal pelvis on the left. No hydronephrosis. Bladder is distended. Stomach/Bowel: Stomach is unremarkable. Bowel is normal in caliber. Stool is present throughout the colon. Vascular/Lymphatic: Aortic atherosclerosis. No enlarged lymph nodes are identified. Reproductive: Calcified uterine fibroids. Other: No free fluid.  No acute abnormality of the abdominal wall. Musculoskeletal: No acute osseous abnormality. Right femoral head avascular necrosis. IMPRESSION: Distended bladder. Nonobstructing small renal calculi. Calcified uterine fibroids. Aortic atherosclerosis. Right femoral head AVN. Electronically Signed   By: Macy Mis M.D.   On: 07/30/2020 15:37   CT Head Wo Contrast  Result Date: 07/30/2020 CLINICAL DATA:  Mental status change. Vomiting and headache. Hyperglycemia. EXAM: CT HEAD WITHOUT CONTRAST TECHNIQUE: Contiguous axial images were obtained from the base of the skull through the vertex without intravenous contrast. COMPARISON:  Head CT 07/18/2020 and MRI 05/05/2020 FINDINGS: Brain: There is no evidence of an acute infarct, intracranial hemorrhage, mass, midline shift, or extra-axial fluid collection. The ventricles and sulci are within normal limits for age. Patchy hypodensities in the cerebral white matter bilaterally are unchanged and nonspecific but compatible with mild-to-moderate chronic small vessel ischemic disease. Vascular: Calcified atherosclerosis at the skull base. No hyperdense vessel. Skull: No fracture or suspicious osseous lesion. Sinuses/Orbits: Visualized paranasal sinuses and mastoid air cells are clear. Visualized orbits are unremarkable. Other: None. IMPRESSION: 1. No evidence of acute intracranial abnormality. 2. Mild-to-moderate chronic small vessel ischemic disease. Electronically Signed   By: Logan Bores M.D.   On: 07/30/2020 15:31   DG Chest Portable 1 View  Result Date: 07/30/2020 CLINICAL DATA:  Sepsis. EXAM: PORTABLE CHEST  1 VIEW COMPARISON:  04/30/2020 FINDINGS: Mild cardiomegaly is unchanged from prior exam. No focal airspace disease. No pleural effusion or pneumothorax. Rounded calcification in the right axilla is unchanged. No acute osseous abnormalities are seen. IMPRESSION: Stable mild cardiomegaly. No acute chest findings. Electronically Signed   By: Keith Rake M.D.   On: 07/30/2020 18:16   ECHOCARDIOGRAM COMPLETE  Result Date: 07/31/2020    ECHOCARDIOGRAM REPORT   Patient Name:   Erica Mercer Date of Exam:  07/31/2020 Medical Rec #:  [id number]        Height:       62.0 in Accession #:    [id number]       Weight:       95.5 lb Date of Birth:  03/10/1955        BSA:          1.396 m Patient Age:    4 years         BP:           120/66 mmHg Patient Gender: F                HR:           82 bpm. Exam Location:  [facility] Procedure: 2D Echo, Color Doppler and Cardiac Doppler Indications:     I21.4 NSTEMI  History:         Patient has no prior history of Echocardiogram examinations.                  CAD; Risk Factors:Hypertension, Diabetes and Dyslipidemia.  Sonographer:     Charmayne Sheer RDCS (AE) Referring Phys:  [id number] Collier Bullock Diagnosing Phys: Ida Rogue MD  Sonographer Comments: Image acquisition challenging due to uncooperative patient. Global longitudinal strain was attempted. IMPRESSIONS  1. Left ventricular ejection fraction, by estimation, is 60 to 65%. The left ventricle has normal function. The left ventricle has no regional wall motion abnormalities. There is moderate left ventricular hypertrophy. Left ventricular diastolic parameters are consistent with Grade I diastolic dysfunction (impaired relaxation). The average left ventricular global longitudinal strain is -15.7 %.  2. Right ventricular systolic function is normal. The right ventricular size is normal. There is normal pulmonary artery systolic pressure. The estimated right ventricular systolic pressure is 31.4 mmHg.  3. Tricuspid valve  regurgitation is mild to moderate. FINDINGS  Left Ventricle: Left ventricular ejection fraction, by estimation, is 60 to 65%. The left ventricle has normal function. The left ventricle has no regional wall motion abnormalities. The average left ventricular global longitudinal strain is -15.7 %. The left ventricular internal cavity size was normal in size. There is moderate left ventricular hypertrophy. Left ventricular diastolic parameters are consistent with Grade I diastolic dysfunction (impaired relaxation). Right Ventricle: The right ventricular size is normal. No increase in right ventricular wall thickness. Right ventricular systolic function is normal. There is normal pulmonary artery systolic pressure. The tricuspid regurgitant velocity is 2.52 m/s, and  with an assumed right atrial pressure of 5 mmHg, the estimated right ventricular systolic pressure is 97.0 mmHg. Left Atrium: Left atrial size was normal in size. Right Atrium: Right atrial size was normal in size. Pericardium: There is no evidence of pericardial effusion. Mitral Valve: The mitral valve is normal in structure. Trivial mitral valve regurgitation. No evidence of mitral valve stenosis. MV peak gradient, 5.7 mmHg. The mean mitral valve gradient is 2.0 mmHg. Tricuspid Valve: The tricuspid valve is normal in structure. Tricuspid valve regurgitation is mild to moderate. No evidence of tricuspid stenosis. Aortic Valve: The aortic valve is normal in structure. Aortic valve regurgitation is not visualized. No aortic stenosis is present. Aortic valve mean gradient measures 5.0 mmHg. Aortic valve peak gradient measures 9.5 mmHg. Aortic valve area, by VTI measures 2.00 cm. Pulmonic Valve: The pulmonic valve was normal in structure. Pulmonic valve regurgitation is not visualized. No evidence of pulmonic stenosis. Aorta: The aortic root is normal in size and structure. Venous: The inferior vena cava is normal  in size with greater than 50% respiratory  variability, suggesting right atrial pressure of 3 mmHg. IAS/Shunts: No atrial level shunt detected by color flow Doppler.  LEFT VENTRICLE PLAX 2D LVIDd:         3.60 cm  Diastology LVIDs:         2.10 cm  LV e' medial:    5.11 cm/s LV PW:         1.10 cm  LV E/e' medial:  14.5 LV IVS:        1.00 cm  LV e' lateral:   5.55 cm/s LVOT diam:     1.90 cm  LV E/e' lateral: 13.4 LV SV:         51 LV SV Index:   37       2D Longitudinal Strain LVOT Area:     2.84 cm 2D Strain GLS Avg:     -15.7 %  RIGHT VENTRICLE RV Basal diam:  2.50 cm LEFT ATRIUM             Index       RIGHT ATRIUM           Index LA diam:        3.00 cm 2.15 cm/m  RA Area:     10.40 cm LA Vol (A2C):   32.3 ml 23.14 ml/m RA Volume:   23.50 ml  16.83 ml/m LA Vol (A4C):   38.4 ml 27.51 ml/m LA Biplane Vol: 35.6 ml 25.50 ml/m  AORTIC VALVE                   PULMONIC VALVE AV Area (Vmax):    1.91 cm    PV Vmax:       0.88 m/s AV Area (Vmean):   1.90 cm    PV Vmean:      64.700 cm/s AV Area (VTI):     2.00 cm    PV VTI:        0.157 m AV Vmax:           154.00 cm/s PV Peak grad:  3.1 mmHg AV Vmean:          99.300 cm/s PV Mean grad:  2.0 mmHg AV VTI:            0.257 m AV Peak Grad:      9.5 mmHg AV Mean Grad:      5.0 mmHg LVOT Vmax:         104.00 cm/s LVOT Vmean:        66.700 cm/s LVOT VTI:          0.181 m LVOT/AV VTI ratio: 0.70  AORTA Ao Root diam: 3.40 cm MITRAL VALVE                TRICUSPID VALVE MV Area (PHT): 3.13 cm     TR Peak grad:   25.4 mmHg MV Area VTI:   1.50 cm     TR Vmax:        252.00 cm/s MV Peak grad:  5.7 mmHg MV Mean grad:  2.0 mmHg     SHUNTS MV Vmax:       1.19 m/s     Systemic VTI:  0.18 m MV Vmean:      74.5 cm/s    Systemic Diam: 1.90 cm MV Decel Time: 242 msec MV E velocity: 74.10 cm/s MV A velocity: 103.00 cm/s MV E/A ratio:  0.72 Ida Rogue MD Electronically signed by Christia Reading  Gollan MD Signature Date/Time: 07/31/2020/12:50:29 PM    Final     Cardiac Studies   TTE (07/31/2020):  1. Left ventricular ejection  fraction, by estimation, is 60 to 65%. The  left ventricle has normal function. The left ventricle has no regional  wall motion abnormalities. There is moderate left ventricular hypertrophy.  Left ventricular diastolic  parameters are consistent with Grade I diastolic dysfunction (impaired  relaxation). The average left ventricular global longitudinal strain is  -15.7 %.   2. Right ventricular systolic function is normal. The right ventricular  size is normal. There is normal pulmonary artery systolic pressure. The  estimated right ventricular systolic pressure is 70.1 mmHg.   3. Tricuspid valve regurgitation is mild to moderate.   Patient Profile     65 y.o. female with h/o DM complicated by DKA, hypertension, bipolar disorder, and multiple ED visits/admission with altered mental status, admitted with altered mental status in the setting of fever and markedly elevated blood sugar.  She was incidentally found to have elevated troponin.  Assessment & Plan    Elevated troponin, suspect demand ischemia: Patient denies current or prior chest pain or shortness of breath.  Last HS-TnI on 7/6 was still trending up (2075).  Echo yesterday with normal LVEF; no obvious WMA, though images were suboptimal.  EKG's on admission with significant artifact, LVH.  Some tracings show lateral TWI and nonspecific ST changes. Repeat HS-TnI to ensure it is trending down. Repeat EKG. No plans for cath at this time, given lack of angina and history of noncompliance as well as drug use. Could consider noninvasive ischemia testing at some point to rule out high-risk ischemia. Complete 48 hours of IV heparin (should Marielouise Amey around 8 PM tonight). Continue aspirin, amlodipine, and metoprolol. Defer adding statin for now given mild transaminitis. Could consider adding clopidogrel with DAPT x 1 year, though will defer in case invasive procedure, such as LP, needs to be reconsidered during this admission.  Fever and  AMS: Initial concern for meningitis, though suspicion is less at this point. Antibiotics and ongoing workup per IM and ID.  Hypertension: BP labile but overall improved. Continue current doses of amlodipine, losartan, and metoprolol.  Diabetes mellitus/DKA: Per IM.  Importance of medication compliance reinforced.  For questions or updates, please contact [place] Please consult [email or web address] for contact info under [facility].     Signed, Nelva Bush, MD  08/01/2020, 8:16 AM

## 2020-08-01 NOTE — Progress Notes (Signed)
ID Pt is doing well No fever No distress  Awake and alert BP 119/66   Pulse 73   Temp 98.7 F (37.1 C) (Oral)   Resp 14   Ht 5\' 2"  (1.575 m)   Wt 43.3 kg   SpO2 97%   BMI 17.46 kg/m    Chest b/l air entry rhonchi Alopecia  Hss12s2 Abd soft Cns non focal  Labs CBC Latest Ref Rng & Units 08/01/2020 07/31/2020 07/30/2020  WBC 4.0 - 10.5 K/uL 11.4(H) - 12.2(H)  Hemoglobin 12.0 - 15.0 g/dL 10.6(L) 10.1(L) 10.5(L)  Hematocrit 36.0 - 46.0 % 30.6(L) - 29.9(L)  Platelets 150 - 400 K/uL 220 - 251    CMP Latest Ref Rng & Units 08/01/2020 07/31/2020 07/30/2020  Glucose 70 - 99 mg/dL 284(H) 113(H) 305(H)  BUN 8 - 23 mg/dL 25(H) 36(H) 44(H)  Creatinine 0.44 - 1.00 mg/dL 1.51(H) 1.44(H) 1.61(H)  Sodium 135 - 145 mmol/L 137 144 141  Potassium 3.5 - 5.1 mmol/L 3.5 4.0 4.4  Chloride 98 - 111 mmol/L 106 110 109  CO2 22 - 32 mmol/L 24 26 21(L)  Calcium 8.9 - 10.3 mg/dL 8.4(L) 8.7(L) 8.7(L)  Total Protein 6.5 - 8.1 g/dL - - -  Total Bilirubin 0.3 - 1.2 mg/dL - - -  Alkaline Phos 38 - 126 U/L - - -  AST 15 - 41 U/L - - -  ALT 0 - 44 U/L - - -    Micro BC- NG  Impression/recommendation  DKA with metabolic encephalopathy- much improved No evidence of abcterial meningitis Antibiotics were discontinued yesterday No fever and wbc normalizing as well  HTN on amlodipine, metoprolol and losartan  AKI on CKD- stable Foley removed  Discussed the management with the hospitalist  ID will sign off- call if needed

## 2020-08-01 NOTE — Evaluation (Signed)
Physical Therapy Evaluation Patient Details Name: Erica Mercer MRN: [id number] DOB: Jan 26, 1956 Today's Date: 08/01/2020   History of Present Illness  Pt admitted to [facility] on 07/30/20 for c/o abdominal pain and vomiting, elevated blood sugar in 600's; felt to have sepsis vs. DKA. Per cardiology consult, elevated troponin likely due to demand ischemia. PMH significant for: DM, HTN, CAD, bipolar disorder, hx drug use, and medication non-compliance.   Clinical Impression  Pt is a 65 year old F admitted to hospital on 07/30/20 for DKA. Pt questionable historian of health as she was only A&O x3. At baseline, pt was mod I for limited ambulation with rollator, ADL's, and IADL's. Pt states that while she lives with her daughter, her daughter works 2 jobs and does not help her at home. Pt presents with functional weakness, decreased balance, decreased activity tolerance, increased pain levels, and poor safety awareness resulting in impaired functional mobility. Due to deficits, pt required supervision for bed mobility, min assist for transfers, and min guard for short distance ambulation with RW. Increased time/effort required for all mobility due to decreased activity tolerance. Further mobility limited secondary to RPE of 7-8/10 indicating "vigorous activity" after ambulating 75ft from EOB>recliner. Deficits limit the pt's ability to safely and independently perform ADL's, transfer, and ambulate. Pt will benefit from acute skilled PT services to address deficits for return to baseline function. At this time, PT recommends SNF at DC to address deficits and improve overall safety with functional mobility prior to return home. Pt agreeable.  Of note, pt states that she does not receive the help she needs from her family at home. Her goal is to live on her own, and she requested assist/more information about potential housing for when she is DC'd. Care team notified.      Follow Up Recommendations SNF;Supervision  for mobility/OOB    Equipment Recommendations  None recommended by PT       Precautions / Restrictions Precautions Precautions: Fall Restrictions Weight Bearing Restrictions: No      Mobility  Bed Mobility Overal bed mobility: Needs Assistance Bed Mobility: Supine to Sit     Supine to sit: Supervision;HOB elevated     General bed mobility comments: Supervision for safety to perform supine>sit transfer with HOB elevated. Increased time/effort for mobility. Cues for safety/sequencing.    Transfers Overall transfer level: Needs assistance Equipment used: Rolling walker (2 wheeled) Transfers: Sit to/from Stand Sit to Stand: Min assist;From elevated surface         General transfer comment: Min assist for power to stand from elevated bed height with RW. Increased time/effort required to achieve erect posture and transition of UE from EOB>RW. Verbal cues for safety/sequencing.  Ambulation/Gait Ambulation/Gait assistance: Min guard Gait Distance (Feet): 5 Feet Assistive device: Rolling walker (2 wheeled)       General Gait Details: Min guard for safety to ambulate from EOB>recliner with RW. Pt demonstrates narrow BOS, decreased step length/foot clearance bil, poor safety awareness, decreased RW proximity, and slowed cadence. Evidence for imbalance with turns.     Balance Overall balance assessment: Needs assistance   Sitting balance-Leahy Scale: Good Sitting balance - Comments: Pt able to don socks seated EOB without LOB   Standing balance support: Bilateral upper extremity supported;During functional activity Standing balance-Leahy Scale: Fair Standing balance comment: Requires BUE support on RW; decreased balance noted with turns in RW          Pertinent Vitals/Pain Pain Assessment: 0-10 Pain Score: 7  Pain Location: back Pain  Intervention(s): Limited activity within patient's tolerance;Monitored during session;Repositioned    Home Living Family/patient  expects to be discharged to:: Private residence Living Arrangements: Children Available Help at Discharge: Available PRN/intermittently (Daughter works 2 jobs; pt states "nobody do nothing for me") Type of Home: Apartment Home Access: Level entry     Home Layout: One level Home Equipment: Grab bars - tub/shower;Walker - 2 wheels;Walker - 4 wheels;Cane - single point Additional Comments: 3in1    Prior Function Level of Independence: Independent with assistive device(s)         Comments: Pt questionable historian of health as she was only A&O x3. Reports using rollator for limited community ambulation and being mod I with ADL's and IADL's. Pt uses electric scooter for grocery shopping and does not drive. Has a hx of falls and reports unsteadiness with ambulation.        Extremity/Trunk Assessment   Upper Extremity Assessment Upper Extremity Assessment: Overall WFL for tasks assessed (Grossly 4/5 with decreased tricep strength at 3/5; sensation grossly intact with c/o chronic N/T in hands secondary to peripheral neuropathy)    Lower Extremity Assessment Lower Extremity Assessment: Overall WFL for tasks assessed (Grossly 4/5; sensation grossly intact with c/o chronic N/T in legs from peripheral neuropathy; coordination intact)    Cervical / Trunk Assessment Cervical / Trunk Assessment: Kyphotic  Communication      Cognition Arousal/Alertness: Awake/alert Behavior During Therapy: WFL for tasks assessed/performed Overall Cognitive Status: Impaired/Different from baseline Area of Impairment: Memory;Safety/judgement         Memory: Decreased short-term memory   Safety/Judgement: Decreased awareness of safety     General Comments: A&O x3; unable to state situation      General Comments General comments (skin integrity, edema, etc.): Increased c/o leg/back pain with MMT to RLE    Exercises Other Exercises Other Exercises: Pt able to participate in bed mobility,  transfers, and limited gait with RW. Pt required grossly supervision-min guard for safety with mobility. Other Exercises: Pt educated regarding: PT role/POC, DC recommendations, benefits of OOB mobility, and safety with mobility. She verbalized understanding.   Assessment/Plan    PT Assessment Patient needs continued PT services  PT Problem List Decreased strength;Decreased activity tolerance;Decreased balance;Decreased mobility;Decreased safety awareness;Impaired sensation;Pain       PT Treatment Interventions Gait training;Functional mobility training;Therapeutic activities;Therapeutic exercise;Balance training;Neuromuscular re-education    PT Goals (Current goals can be found in the Care Plan section)  Acute Rehab PT Goals Patient Stated Goal: "to live on my own" PT Goal Formulation: With patient Time For Goal Achievement: 08/15/20 Potential to Achieve Goals: Fair    Frequency Min 2X/week   Barriers to discharge Decreased caregiver support         AM-PAC PT "6 Clicks" Mobility  Outcome Measure Help needed turning from your back to your side while in a flat bed without using bedrails?: None Help needed moving from lying on your back to sitting on the side of a flat bed without using bedrails?: A Little Help needed moving to and from a bed to a chair (including a wheelchair)?: A Little Help needed standing up from a chair using your arms (e.g., wheelchair or bedside chair)?: A Little Help needed to walk in hospital room?: A Little Help needed climbing 3-5 steps with a railing? : A Lot 6 Click Score: 18    End of Session Equipment Utilized During Treatment: Gait belt Activity Tolerance: Patient tolerated treatment well;Patient limited by fatigue Patient left: in chair;with call bell/phone within  reach (no chair alarm box in ICU; RN notified) Nurse Communication: Mobility status PT Visit Diagnosis: Unsteadiness on feet (R26.81);History of falling (Z91.81);Muscle weakness  (generalized) (M62.81);Pain Pain - part of body:  (back)    Time: 1329-1400 PT Time Calculation (min) (ACUTE ONLY): 31 min   Charges:   PT Evaluation $PT Eval Low Complexity: 1 Low PT Treatments $Therapeutic Activity: 8-22 mins       Herminio Commons, PT, DPT 3:02 PM,08/01/20

## 2020-08-01 NOTE — Progress Notes (Signed)
Patient ID: Erica Mercer, female   DOB: 07/26/1955, 65 y.o.   MRN: [id number] [facility] PROGRESS NOTE  Maisee Vollman LGX:[id number] DOB: 1955/03/24 DOA: 07/30/2020 PCP: [name], NP  HPI/Subjective: Patient feeling better today.  Has some back pain.  No complaints of chest pain or shortness of breath.  Admitted with DKA.  I asked her if she has her medications at home and she says she needs her Lantus.  Episodes of hypoglycemia.  Objective: Vitals:   08/01/20 1400 08/01/20 1444  BP: (!) 155/101 (!) 165/90  Pulse: 69 74  Resp: 14   Temp:    SpO2: 98% 98%    Intake/Output Summary (Last 24 hours) at 08/01/2020 1609 Last data filed at 08/01/2020 1500 Gross per 24 hour  Intake 769.49 ml  Output 2200 ml  Net -1430.51 ml   Filed Weights   07/30/20 1143 07/30/20 2145  Weight: 56.7 kg 43.3 kg    ROS: Review of Systems  Respiratory:  Negative for shortness of breath.   Cardiovascular:  Negative for chest pain.  Gastrointestinal:  Negative for abdominal pain, nausea and vomiting.  Exam: Physical Exam HENT:     Head: Normocephalic.     Mouth/Throat:     Pharynx: No oropharyngeal exudate.  Eyes:     General: Lids are normal.     Conjunctiva/sclera: Conjunctivae normal.     Pupils: Pupils are equal, round, and reactive to light.  Cardiovascular:     Rate and Rhythm: Normal rate and regular rhythm.     Heart sounds: S1 normal and S2 normal. Murmur heard.  Systolic murmur is present with a grade of 2/6.  Pulmonary:     Breath sounds: Examination of the right-lower field reveals decreased breath sounds. Examination of the left-lower field reveals decreased breath sounds. Decreased breath sounds present. No wheezing, rhonchi or rales.  Abdominal:     Palpations: Abdomen is soft.     Tenderness: There is abdominal tenderness in the epigastric area.  Musculoskeletal:     Right ankle: No swelling.     Left ankle: No swelling.  Skin:    General: Skin is warm.      Findings: No rash.  Neurological:     Mental Status: She is alert.     Comments: Follows all commands.  Able to straight leg raise.      Data Reviewed: Basic Metabolic Panel: Recent Labs  Lab 07/30/20 1336 07/30/20 1617 07/30/20 1840 07/31/20 0258 08/01/20 0126  NA 129* 135 141 144 137  K 5.9* 4.9 4.4 4.0 3.5  CL 87* 102 109 110 106  CO2 21* 18* 21* 26 24  GLUCOSE 1,188* 657* 305* 113* 284*  BUN 55* 45* 44* 36* 25*  CREATININE 2.09* 1.76* 1.61* 1.44* 1.51*  CALCIUM 9.8 8.8* 8.7* 8.7* 8.4*   Liver Function Tests: Recent Labs  Lab 07/30/20 1336  AST 87*  ALT 40  ALKPHOS 193*  BILITOT 1.6*  PROT 7.3  ALBUMIN 3.9   Recent Labs  Lab 07/30/20 1336 07/31/20 1602  LIPASE 32 23  AMYLASE  --  78    CBC: Recent Labs  Lab 07/30/20 1336 07/30/20 1840 07/31/20 1602 08/01/20 0126  WBC 6.7 12.2*  --  11.4*  NEUTROABS 5.9  --   --   --   HGB 12.5 10.5* 10.1* 10.6*  HCT 38.1 29.9*  --  30.6*  MCV 99.5 94.9  --  94.4  PLT 302 251  --  220  CBG: Recent Labs  Lab 07/31/20 2223 08/01/20 0744 08/01/20 1121 08/01/20 1155 08/01/20 1603  GLUCAP 157* 146* 35* 140* 270*    Recent Results (from the past 240 hour(s))  Culture, blood (single)     Status: None (Preliminary result)   Collection Time: 07/30/20  3:38 PM   Specimen: BLOOD  Result Value Ref Range Status   Specimen Description BLOOD BLOOD LEFT FOREARM  Final   Special Requests   Final    BOTTLES DRAWN AEROBIC AND ANAEROBIC Blood Culture results may not be optimal due to an inadequate volume of blood received in culture bottles   Culture   Final    NO GROWTH 2 DAYS Performed at [facility], [address], [place], [place] [address]    Report Status PENDING  Incomplete  Resp Panel by RT-PCR (Flu A&B, Covid) Nasopharyngeal Swab     Status: None   Collection Time: 07/30/20  3:58 PM   Specimen: Nasopharyngeal Swab; Nasopharyngeal(NP) swabs in vial transport medium  Result Value Ref Range  Status   SARS Coronavirus 2 by RT PCR NEGATIVE NEGATIVE Final    Comment: (NOTE) SARS-CoV-2 target nucleic acids are NOT DETECTED.  The SARS-CoV-2 RNA is generally detectable in upper respiratory specimens during the acute phase of infection. The lowest concentration of SARS-CoV-2 viral copies this assay can detect is 138 copies/mL. A negative result does not preclude SARS-Cov-2 infection and should not be used as the sole basis for treatment or other patient management decisions. A negative result may occur with  improper specimen collection/handling, submission of specimen other than nasopharyngeal swab, presence of viral mutation(s) within the areas targeted by this assay, and inadequate number of viral copies(<138 copies/mL). A negative result must be combined with clinical observations, patient history, and epidemiological information. The expected result is Negative.  Fact Sheet for Patients:  EntrepreneurPulse.com.au  Fact Sheet for Healthcare Providers:  IncredibleEmployment.be  This test is no t yet approved or cleared by the [place] FDA and  has been authorized for detection and/or diagnosis of SARS-CoV-2 by FDA under an Emergency Use Authorization (EUA). This EUA will remain  in effect (meaning this test can be used) for the duration of the COVID-19 declaration under Section 564(b)(1) of the Act, 21 U.S.C.section 360bbb-3(b)(1), unless the authorization is terminated  or revoked sooner.       Influenza A by PCR NEGATIVE NEGATIVE Final   Influenza B by PCR NEGATIVE NEGATIVE Final    Comment: (NOTE) The Xpert Xpress SARS-CoV-2/FLU/RSV plus assay is intended as an aid in the diagnosis of influenza from Nasopharyngeal swab specimens and should not be used as a sole basis for treatment. Nasal washings and aspirates are unacceptable for Xpert Xpress SARS-CoV-2/FLU/RSV testing.  Fact Sheet for  Patients: EntrepreneurPulse.com.au  Fact Sheet for Healthcare Providers: IncredibleEmployment.be  This test is not yet approved or cleared by the [place] FDA and has been authorized for detection and/or diagnosis of SARS-CoV-2 by FDA under an Emergency Use Authorization (EUA). This EUA will remain in effect (meaning this test can be used) for the duration of the COVID-19 declaration under Section 564(b)(1) of the Act, 21 U.S.C. section 360bbb-3(b)(1), unless the authorization is terminated or revoked.  Performed at [facility], [address] [place], [place] [address]   MRSA Next Gen by PCR, Nasal     Status: None   Collection Time: 07/30/20  9:45 PM   Specimen: Nasal Mucosa; Nasal Swab  Result Value Ref Range Status  MRSA by PCR Next Gen NOT DETECTED NOT DETECTED Final    Comment: (NOTE) The GeneXpert MRSA Assay (FDA approved for NASAL specimens only), is one component of a comprehensive MRSA colonization surveillance program. It is not intended to diagnose MRSA infection nor to guide or monitor treatment for MRSA infections. Test performance is not FDA approved in patients less than 39 years old. Performed at [facility], [address], [place], [place] [address]      Studies: DG Chest Portable 1 View  Result Date: 07/30/2020 CLINICAL DATA:  Sepsis. EXAM: PORTABLE CHEST 1 VIEW COMPARISON:  04/30/2020 FINDINGS: Mild cardiomegaly is unchanged from prior exam. No focal airspace disease. No pleural effusion or pneumothorax. Rounded calcification in the right axilla is unchanged. No acute osseous abnormalities are seen. IMPRESSION: Stable mild cardiomegaly. No acute chest findings. Electronically Signed   By: Keith Rake M.D.   On: 07/30/2020 18:16   ECHOCARDIOGRAM COMPLETE  Result Date: 07/31/2020    ECHOCARDIOGRAM REPORT   Patient Name:   KATILYNN SINKLER Date of Exam: 07/31/2020 Medical Rec #:  [id number]         Height:       62.0 in Accession #:    [id number]       Weight:       95.5 lb Date of Birth:  27-Mar-1955        BSA:          1.396 m Patient Age:    27 years         BP:           120/66 mmHg Patient Gender: F                HR:           82 bpm. Exam Location:  [facility] Procedure: 2D Echo, Color Doppler and Cardiac Doppler Indications:     I21.4 NSTEMI  History:         Patient has no prior history of Echocardiogram examinations.                  CAD; Risk Factors:Hypertension, Diabetes and Dyslipidemia.  Sonographer:     Charmayne Sheer RDCS (AE) Referring Phys:  [id number] Collier Bullock Diagnosing Phys: Ida Rogue MD  Sonographer Comments: Image acquisition challenging due to uncooperative patient. Global longitudinal strain was attempted. IMPRESSIONS  1. Left ventricular ejection fraction, by estimation, is 60 to 65%. The left ventricle has normal function. The left ventricle has no regional wall motion abnormalities. There is moderate left ventricular hypertrophy. Left ventricular diastolic parameters are consistent with Grade I diastolic dysfunction (impaired relaxation). The average left ventricular global longitudinal strain is -15.7 %.  2. Right ventricular systolic function is normal. The right ventricular size is normal. There is normal pulmonary artery systolic pressure. The estimated right ventricular systolic pressure is 73.7 mmHg.  3. Tricuspid valve regurgitation is mild to moderate. FINDINGS  Left Ventricle: Left ventricular ejection fraction, by estimation, is 60 to 65%. The left ventricle has normal function. The left ventricle has no regional wall motion abnormalities. The average left ventricular global longitudinal strain is -15.7 %. The left ventricular internal cavity size was normal in size. There is moderate left ventricular hypertrophy. Left ventricular diastolic parameters are consistent with Grade I diastolic dysfunction (impaired relaxation). Right Ventricle: The right ventricular size is  normal. No increase in right ventricular wall thickness. Right ventricular systolic function is normal. There is normal pulmonary artery systolic pressure. The tricuspid regurgitant  velocity is 2.52 m/s, and  with an assumed right atrial pressure of 5 mmHg, the estimated right ventricular systolic pressure is 06.2 mmHg. Left Atrium: Left atrial size was normal in size. Right Atrium: Right atrial size was normal in size. Pericardium: There is no evidence of pericardial effusion. Mitral Valve: The mitral valve is normal in structure. Trivial mitral valve regurgitation. No evidence of mitral valve stenosis. MV peak gradient, 5.7 mmHg. The mean mitral valve gradient is 2.0 mmHg. Tricuspid Valve: The tricuspid valve is normal in structure. Tricuspid valve regurgitation is mild to moderate. No evidence of tricuspid stenosis. Aortic Valve: The aortic valve is normal in structure. Aortic valve regurgitation is not visualized. No aortic stenosis is present. Aortic valve mean gradient measures 5.0 mmHg. Aortic valve peak gradient measures 9.5 mmHg. Aortic valve area, by VTI measures 2.00 cm. Pulmonic Valve: The pulmonic valve was normal in structure. Pulmonic valve regurgitation is not visualized. No evidence of pulmonic stenosis. Aorta: The aortic root is normal in size and structure. Venous: The inferior vena cava is normal in size with greater than 50% respiratory variability, suggesting right atrial pressure of 3 mmHg. IAS/Shunts: No atrial level shunt detected by color flow Doppler.  LEFT VENTRICLE PLAX 2D LVIDd:         3.60 cm  Diastology LVIDs:         2.10 cm  LV e' medial:    5.11 cm/s LV PW:         1.10 cm  LV E/e' medial:  14.5 LV IVS:        1.00 cm  LV e' lateral:   5.55 cm/s LVOT diam:     1.90 cm  LV E/e' lateral: 13.4 LV SV:         51 LV SV Index:   37       2D Longitudinal Strain LVOT Area:     2.84 cm 2D Strain GLS Avg:     -15.7 %  RIGHT VENTRICLE RV Basal diam:  2.50 cm LEFT ATRIUM             Index        RIGHT ATRIUM           Index LA diam:        3.00 cm 2.15 cm/m  RA Area:     10.40 cm LA Vol (A2C):   32.3 ml 23.14 ml/m RA Volume:   23.50 ml  16.83 ml/m LA Vol (A4C):   38.4 ml 27.51 ml/m LA Biplane Vol: 35.6 ml 25.50 ml/m  AORTIC VALVE                   PULMONIC VALVE AV Area (Vmax):    1.91 cm    PV Vmax:       0.88 m/s AV Area (Vmean):   1.90 cm    PV Vmean:      64.700 cm/s AV Area (VTI):     2.00 cm    PV VTI:        0.157 m AV Vmax:           154.00 cm/s PV Peak grad:  3.1 mmHg AV Vmean:          99.300 cm/s PV Mean grad:  2.0 mmHg AV VTI:            0.257 m AV Peak Grad:      9.5 mmHg AV Mean Grad:      5.0 mmHg LVOT  Vmax:         104.00 cm/s LVOT Vmean:        66.700 cm/s LVOT VTI:          0.181 m LVOT/AV VTI ratio: 0.70  AORTA Ao Root diam: 3.40 cm MITRAL VALVE                TRICUSPID VALVE MV Area (PHT): 3.13 cm     TR Peak grad:   25.4 mmHg MV Area VTI:   1.50 cm     TR Vmax:        252.00 cm/s MV Peak grad:  5.7 mmHg MV Mean grad:  2.0 mmHg     SHUNTS MV Vmax:       1.19 m/s     Systemic VTI:  0.18 m MV Vmean:      74.5 cm/s    Systemic Diam: 1.90 cm MV Decel Time: 242 msec MV E velocity: 74.10 cm/s MV A velocity: 103.00 cm/s MV E/A ratio:  0.72 Ida Rogue MD Electronically signed by Ida Rogue MD Signature Date/Time: 07/31/2020/12:50:29 PM    Final     Scheduled Meds:  amLODipine  10 mg Oral Daily   aspirin EC  81 mg Oral Daily   Chlorhexidine Gluconate Cloth  6 each Topical Daily   [START ON 08/02/2020] enoxaparin (LOVENOX) injection  30 mg Subcutaneous Q24H   insulin aspart  0-15 Units Subcutaneous TID WC   insulin glargine  10 Units Subcutaneous Q24H   ipratropium-albuterol  3 mL Nebulization Q6H   losartan  50 mg Oral Daily   mouth rinse  15 mL Mouth Rinse BID   metoprolol tartrate  25 mg Oral BID   pantoprazole  40 mg Oral BID   Continuous Infusions:  heparin 550 Units/hr (08/01/20 1500)    Assessment/Plan:  Hypoglycemia with diabetes.  Initially admitted  with diabetic ketoacidosis.  Decrease glargine insulin again to 10 units nightly and sliding scale for right now.  Suspect noncompliance with her elevated hemoglobin A1c of 14.8 but I will check a hemoglobin electrophoresis.  Need to have a consistent dose of Lantus without causing hypoglycemia or diabetic ketoacidosis. Elevated troponin, demand ischemia.  Heparin drip for 48 hours on aspirin and metoprolol. Acute metabolic encephalopathy and fever.  Antibiotics stopped.  Patient's mental status is much improved today.  Sepsis ruled out Acute kidney injury and hyponatremia secondary to diabetic ketoacidosis.  Creatinine improved from 2.09 down to 1.51. Lactic acidosis Essential hypertension on amlodipine and losartan and metoprolol Hematemesis on IV Protonix.  Hemoglobin currently stable on heparin drip and aspirin Hyperkalemia improved with IV fluids Weakness.  Physical therapy recommending rehab        Code Status:     Code Status Orders  (From admission, onward)           Start     Ordered   07/30/20 1547  Full code  Continuous        07/30/20 1547           Code Status History     Date Active Date Inactive Code Status Order ID Comments User Context   05/21/2020 1351 05/22/2020 1742 Full Code [id number]  Kayleen Memos, DO ED   05/05/2020 1356 05/07/2020 1708 Full Code [id number]  Ivor Costa, MD ED   04/30/2020 1245 05/01/2020 2012 Full Code [id number]  Cox, Baker, DO ED   02/25/2020 1649 02/27/2020 2115 DNR [id number]  Cox, Briant Cedar, DO ED  Family Communication: Spoke with patient's daughter on the phone Disposition Plan: Status is: Inpatient  Dispo: The patient is from: Home              Anticipated d/c is to: Rehab              Patient currently having hypoglycemic episodes being placed back on insulin.   Difficult to place patient.  No  Consultants: Cardiology Infectious disease  Time spent: 28 minutes  [place]

## 2020-08-01 NOTE — Progress Notes (Signed)
PHARMACIST - PHYSICIAN COMMUNICATION  CONCERNING: IV to Oral Route Change Policy  RECOMMENDATION: This patient is receiving pantoprazole by the intravenous route.  Based on criteria approved by the Pharmacy and Therapeutics Committee, the intravenous medication(s) is/are being converted to the equivalent oral dose form(s).   DESCRIPTION: These criteria include: The patient is eating (either orally or via tube) and/or has been taking other orally administered medications for a least 24 hours The patient has no evidence of active gastrointestinal bleeding or impaired GI absorption (gastrectomy, short bowel, patient on TNA or NPO).  If you have questions about this conversion, please contact the [place], [facility] 08/01/2020 11:43 AM

## 2020-08-01 NOTE — Consult Note (Signed)
ANTICOAGULATION CONSULT NOTE  Pharmacy Consult for heparin Indication: chest pain/ACS  No Known Allergies  Patient Measurements: Height: 5\' 2"  (157.5 cm) Weight: 43.3 kg (95 lb 7.4 oz) IBW/kg (Calculated) : 50.1 Heparin Dosing Weight: 56.7 kg  Vital Signs: Temp: 99.86 F (37.7 C) (07/08 0100) Temp Source: Bladder (07/08 0100) BP: 126/95 (07/08 0100) Pulse Rate: 69 (07/08 0100)  Labs: Recent Labs    07/30/20 1336 07/30/20 1538 07/30/20 1746 07/30/20 1840 07/30/20 1955 07/30/20 1957 07/30/20 2314 07/31/20 0258 07/31/20 1602 08/01/20 0126  HGB 12.5  --   --  10.5*  --   --   --   --  10.1* 10.6*  HCT 38.1  --   --  29.9*  --   --   --   --   --  30.6*  PLT 302  --   --  251  --   --   --   --   --  220  APTT  --   --   --   --  >200*  --   --   --   --   --   LABPROT  --   --   --   --  14.5  --   --   --   --   --   INR  --   --   --   --  1.1  --   --   --   --   --   HEPARINUNFRC  --   --   --   --   --   --   --  0.95* 0.74* 0.52  CREATININE 2.09*   < >  --  1.61*  --   --   --  1.44*  --  1.51*  TROPONINIHS  --    < > 792*  --   --  1,520* 2,075*  --   --   --    < > = values in this interval not displayed.     Estimated Creatinine Clearance: 25.4 mL/min (A) (by C-G formula based on SCr of 1.51 mg/dL (H)).   Medical History: Past Medical History:  Diagnosis Date   Bipolar 1 disorder ([place])    CAD (coronary artery disease)    Diabetes mellitus without complication ([place])    HLD (hyperlipidemia)    HTN (hypertension)     Medications:  No PTA anticoagulation or antiplatelet therapy   Assessment:  65 y.o. female with medical history significant for T1DM, CAD, bipolar disorder, and HTN who was brought into the ER by EMS for evaluation of diffuse abdominal pain and vomiting. Pt was found to have troponin elevated to 792. Pharmacy has been consulted for heparin dosing for ACS.  Hgb: 10.5; plts: 251 Heparin Dosing Weight: 56.7 kg  7/7 0258 HL 0.95,  supratherapeutic 700 > 600  7/7 1602 HL 0.74 600 > 550  7/8 0126 HL 0.52, therapeutic @ 550 units/hr  Goal of Therapy:  Heparin level 0.3-0.7 units/ml Monitor platelets by anticoagulation protocol: Yes   Plan:  7/8:  HL @ 0126 = 0.52, therapeutic X 1 Will continue pt on current rate and draw confirmation level in 8 hrs.   Jahnavi Muratore D, PharmD,  08/01/2020,2:19 AM

## 2020-08-02 DIAGNOSIS — I2510 Atherosclerotic heart disease of native coronary artery without angina pectoris: Secondary | ICD-10-CM

## 2020-08-02 DIAGNOSIS — N189 Chronic kidney disease, unspecified: Secondary | ICD-10-CM

## 2020-08-02 DIAGNOSIS — E0810 Diabetes mellitus due to underlying condition with ketoacidosis without coma: Secondary | ICD-10-CM

## 2020-08-02 LAB — BASIC METABOLIC PANEL
Anion gap: 5 (ref 5–15)
BUN: 21 mg/dL (ref 8–23)
CO2: 28 mmol/L (ref 22–32)
Calcium: 8.5 mg/dL — ABNORMAL LOW (ref 8.9–10.3)
Chloride: 101 mmol/L (ref 98–111)
Creatinine, Ser: 1.22 mg/dL — ABNORMAL HIGH (ref 0.44–1.00)
GFR, Estimated: 49 mL/min — ABNORMAL LOW (ref >60)
Glucose, Bld: 379 mg/dL — ABNORMAL HIGH (ref 70–99)
Potassium: 3.8 mmol/L (ref 3.5–5.1)
Sodium: 134 mmol/L — ABNORMAL LOW (ref 135–145)

## 2020-08-02 LAB — GLUCOSE, CAPILLARY
Glucose-Capillary: 355 mg/dL — ABNORMAL HIGH (ref 70–99)
Glucose-Capillary: 173 mg/dL — ABNORMAL HIGH (ref 70–99)
Glucose-Capillary: 96 mg/dL (ref 70–99)
Glucose-Capillary: 87 mg/dL (ref 70–99)
Glucose-Capillary: 164 mg/dL — ABNORMAL HIGH (ref 70–99)

## 2020-08-02 LAB — HEMOGLOBIN: Hemoglobin: 10 g/dL — ABNORMAL LOW (ref 12.0–15.0)

## 2020-08-02 MED ORDER — INSULIN GLARGINE 100 UNIT/ML ~~LOC~~ SOLN
7.0000 [IU] | Freq: Two times a day (BID) | SUBCUTANEOUS | Status: DC
  Filled 2020-08-02 (×2): qty 0.07

## 2020-08-02 MED ORDER — IPRATROPIUM-ALBUTEROL 0.5-2.5 (3) MG/3ML IN SOLN: 3.0000 mL | Freq: Four times a day (QID) | RESPIRATORY_TRACT | Status: DC | PRN

## 2020-08-02 MED ORDER — INSULIN ASPART 100 UNIT/ML IJ SOLN
3.0000 [IU] | Freq: Three times a day (TID) | INTRAMUSCULAR | Status: DC
  Administered 2020-08-02 – 2020-08-03 (×4): 3 [IU] via SUBCUTANEOUS
  Filled 2020-08-02 (×4): qty 1

## 2020-08-02 MED ORDER — INSULIN GLARGINE 100 UNIT/ML ~~LOC~~ SOLN
7.0000 [IU] | Freq: Two times a day (BID) | SUBCUTANEOUS | Status: DC
  Administered 2020-08-03: 7 [IU] via SUBCUTANEOUS
  Filled 2020-08-02 (×4): qty 0.07

## 2020-08-02 MED ORDER — OXYCODONE HCL 5 MG PO TABS
5.0000 mg | ORAL_TABLET | Freq: Four times a day (QID) | ORAL | Status: DC | PRN
  Administered 2020-08-02 – 2020-08-03 (×2): 5 mg via ORAL
  Filled 2020-08-02 (×2): qty 1

## 2020-08-02 MED ORDER — INSULIN GLARGINE 100 UNIT/ML ~~LOC~~ SOLN
8.0000 [IU] | Freq: Two times a day (BID) | SUBCUTANEOUS | Status: DC
  Administered 2020-08-02: 8 [IU] via SUBCUTANEOUS
  Filled 2020-08-02 (×3): qty 0.08

## 2020-08-02 NOTE — TOC Progression Note (Signed)
Transition of Care [facility]) - Progression Note    Patient Details  Name: Erica Mercer MRN: [id number] Date of Birth: October 26, 1955  [facility] CM/SW Contact  Izola Price, RN Phone Number: 08/02/2020, 1:27 PM  Clinical Narrative:    7/9. From ICU-15 today. DKA. SNF rec. From PT. OT reported to CM  today that patient told her she did not feel safe at home with daughter. Reported to charge nurse at progression as patient had just arrived on unit. Simmie Davies RN CM         Expected Discharge Plan and Services                                                 Social Determinants of Health (SDOH) Interventions    Readmission Risk Interventions No flowsheet data found.

## 2020-08-02 NOTE — Evaluation (Signed)
Occupational Therapy Evaluation Patient Details Name: Erica Mercer MRN: [id number] DOB: 03-07-1955 Today's Date: 08/02/2020    History of Present Illness Pt admitted to [facility] on 07/30/20 for c/o abdominal pain and vomiting, elevated blood sugar in 600's; felt to have sepsis vs. DKA. Per cardiology consult, elevated troponin likely due to demand ischemia. PMH significant for: DM, HTN, CAD, bipolar disorder, hx drug use, and medication non-compliance.   Clinical Impression   Pt seen for OT eval this date in setting of acute hospitalization d/t DKA. Pt presents with gross weakness this date. Reports her back is sore and she'd like to get OOB. Pt SUPV with sup to sit and requires CGA/MIN A For transfer to chair. On ADL assessment, she requires SETUP and cues to sequence for seated UB ADLs and requires MIN A For seated LB ADLs. Pt will benefit from continued skilled OT for safety with self care. Will continue to follow and recommend STR f/u.     Follow Up Recommendations  SNF;Supervision/Assistance - 24 hour    Equipment Recommendations  3 in 1 bedside commode;Tub/shower seat;Other (comment) (2ww)    Recommendations for Other Services       Precautions / Restrictions Precautions Precautions: Fall Restrictions Weight Bearing Restrictions: No      Mobility Bed Mobility Overal bed mobility: Needs Assistance Bed Mobility: Supine to Sit     Supine to sit: Supervision;HOB elevated     General bed mobility comments: icreased time    Transfers Overall transfer level: Needs assistance Equipment used: Rolling walker (2 wheeled) Transfers: Sit to/from Stand Sit to Stand: Min assist;From elevated surface;Min guard              Balance Overall balance assessment: Needs assistance   Sitting balance-Leahy Scale: Good Sitting balance - Comments: G static sitting   Standing balance support: Bilateral upper extremity supported;During functional activity Standing balance-Leahy  Scale: Fair Standing balance comment: poor safety awareness, requires at least unilateral support                           ADL either performed or assessed with clinical judgement   ADL Overall ADL's : Needs assistance/impaired                                       General ADL Comments: SETUP for seated UB ADLs, MIN A for LB ADLs. CGA for ADL transfers     Vision Patient Visual Report: No change from baseline       Perception     Praxis      Pertinent Vitals/Pain Pain Assessment: 0-10 Pain Score: 4  Pain Location: back Pain Descriptors / Indicators: Sore Pain Intervention(s): Limited activity within patient's tolerance;Monitored during session;Repositioned     Hand Dominance     Extremity/Trunk Assessment Upper Extremity Assessment Upper Extremity Assessment: Overall WFL for tasks assessed;Generalized weakness (rom WFL, MMT grossly 4-/5)   Lower Extremity Assessment Lower Extremity Assessment: Overall WFL for tasks assessed;Generalized weakness   Cervical / Trunk Assessment Cervical / Trunk Assessment: Kyphotic   Communication     Cognition Arousal/Alertness: Awake/alert Behavior During Therapy: WFL for tasks assessed/performed;Impulsive Overall Cognitive Status: Impaired/Different from baseline Area of Impairment: Memory;Safety/judgement                     Memory: Decreased short-term memory   Safety/Judgement: Decreased awareness of safety  General Comments: A&O x3; unable to state situation. follwos commands, somewhat impulsive/decreased safety awarenss   General Comments       Exercises Other Exercises Other Exercises: OT engages pt in ed re: safety, use of call light, roll OT.   Shoulder Instructions      Home Living Family/patient expects to be discharged to:: Private residence Living Arrangements: Children Available Help at Discharge: Available PRN/intermittently (Daughter works 2 jobs; pt states "nobody  do nothing for me") Type of Home: Apartment Home Access: Level entry     Home Layout: One level     Bathroom Shower/Tub: Teacher, early years/pre: [place]: Grab bars - tub/shower;Walker - 2 wheels;Walker - 4 wheels;Cane - single point   Additional Comments: 3in1      Prior Functioning/Environment Level of Independence: Independent with assistive device(s)        Comments: Pt questionable historian of health as she was only A&O x3. Reports using rollator for limited community ambulation and being mod I with ADL's and IADL's. Pt uses electric scooter for grocery shopping and does not drive. Has a hx of falls and reports unsteadiness with ambulation.        OT Problem List: Decreased strength      OT Treatment/Interventions: Self-care/ADL training;Therapeutic activities;Therapeutic exercise    OT Goals(Current goals can be found in the care plan section) Acute Rehab OT Goals Patient Stated Goal: to get out of my dtr's home, into a group home OT Goal Formulation: With patient Time For Goal Achievement: 08/16/20 Potential to Achieve Goals: Good  OT Frequency: Min 1X/week   Barriers to D/C:            Co-evaluation              AM-PAC OT "6 Clicks" Daily Activity     Outcome Measure Help from another person eating meals?: None Help from another person taking care of personal grooming?: A Little Help from another person toileting, which includes using toliet, bedpan, or urinal?: A Little Help from another person bathing (including washing, rinsing, drying)?: A Little Help from another person to put on and taking off regular upper body clothing?: A Little Help from another person to put on and taking off regular lower body clothing?: A Little 6 Click Score: 19   End of Session Equipment Utilized During Treatment: Gait belt Nurse Communication: Mobility status  Activity Tolerance: Patient tolerated treatment well Patient left: with  call bell/phone within reach;in chair;with chair alarm set  OT Visit Diagnosis: Unsteadiness on feet (R26.81)                Time: 6283-1517 OT Time Calculation (min): 21 min Charges:  OT General Charges $OT Visit: 1 Visit OT Treatments $Self Care/Home Management : 8-22 mins  Gerrianne Scale, [place], OTR/L ascom [phone] 08/02/20, 3:01 PM

## 2020-08-02 NOTE — Progress Notes (Signed)
Patient ID: Erica Mercer, female   DOB: 1955-06-24, 65 y.o.   MRN: [id number] [facility] PROGRESS NOTE  Corinthian Kemler TKZ:[id number] DOB: [birth year] DOA: 07/30/2020 PCP: [name], NP  HPI/Subjective: Patient now changed her mind and wants to go home rather than going out to rehab.  Complains of back pain.  Initially admitted with DKA.  Objective: Vitals:   08/02/20 1214 08/02/20 1526  BP: (!) 160/77 132/67  Pulse: 68 71  Resp: 18 18  Temp: 98.4 F (36.9 C) 98.4 F (36.9 C)  SpO2: 100% 100%    Intake/Output Summary (Last 24 hours) at 08/02/2020 1545 Last data filed at 08/01/2020 2100 Gross per 24 hour  Intake 273 ml  Output --  Net 273 ml   Filed Weights   07/30/20 1143 07/30/20 2145  Weight: 56.7 kg 43.3 kg    ROS: Review of Systems  Respiratory:  Negative for shortness of breath.   Cardiovascular:  Negative for chest pain.  Gastrointestinal:  Negative for abdominal pain, nausea and vomiting.  Musculoskeletal:  Positive for back pain.  Exam: Physical Exam HENT:     Head: Normocephalic.     Mouth/Throat:     Pharynx: No oropharyngeal exudate.  Eyes:     General: Lids are normal.     Conjunctiva/sclera: Conjunctivae normal.  Cardiovascular:     Rate and Rhythm: Normal rate and regular rhythm.     Heart sounds: Normal heart sounds, S1 normal and S2 normal.  Pulmonary:     Breath sounds: No decreased breath sounds, wheezing, rhonchi or rales.  Abdominal:     Palpations: Abdomen is soft.     Tenderness: There is abdominal tenderness in the epigastric area.  Musculoskeletal:     Right ankle: No swelling.     Left ankle: No swelling.  Skin:    General: Skin is warm.     Findings: No rash.  Neurological:     Mental Status: She is alert.     Comments: Answers all questions appropriately.  Able to straight leg raise.     Data Reviewed: Basic Metabolic Panel: Recent Labs  Lab 07/30/20 1617 07/30/20 1840 07/31/20 0258 08/01/20 0126  08/02/20 0542  NA 135 141 144 137 134*  K 4.9 4.4 4.0 3.5 3.8  CL 102 109 110 106 101  CO2 18* 21* 26 24 28   GLUCOSE 657* 305* 113* 284* 379*  BUN 45* 44* 36* 25* 21  CREATININE 1.76* 1.61* 1.44* 1.51* 1.22*  CALCIUM 8.8* 8.7* 8.7* 8.4* 8.5*   Liver Function Tests: Recent Labs  Lab 07/30/20 1336  AST 87*  ALT 40  ALKPHOS 193*  BILITOT 1.6*  PROT 7.3  ALBUMIN 3.9   Recent Labs  Lab 07/30/20 1336 07/31/20 1602  LIPASE 32 23  AMYLASE  --  78    CBC: Recent Labs  Lab 07/30/20 1336 07/30/20 1840 07/31/20 1602 08/01/20 0126 08/02/20 0542  WBC 6.7 12.2*  --  11.4*  --   NEUTROABS 5.9  --   --   --   --   HGB 12.5 10.5* 10.1* 10.6* 10.0*  HCT 38.1 29.9*  --  30.6*  --   MCV 99.5 94.9  --  94.4  --   PLT 302 251  --  220  --      CBG: Recent Labs  Lab 08/01/20 2117 08/01/20 2122 08/02/20 0730 08/02/20 0957 08/02/20 1153  GLUCAP 461* 377* 355* 173* 96    Recent Results (from the past 240  hour(s))  Culture, blood (single)     Status: None (Preliminary result)   Collection Time: 07/30/20  3:38 PM   Specimen: BLOOD  Result Value Ref Range Status   Specimen Description BLOOD BLOOD LEFT FOREARM  Final   Special Requests   Final    BOTTLES DRAWN AEROBIC AND ANAEROBIC Blood Culture results may not be optimal due to an inadequate volume of blood received in culture bottles   Culture   Final    NO GROWTH 3 DAYS Performed at [facility], [address], [place], [place] [address]    Report Status PENDING  Incomplete  Resp Panel by RT-PCR (Flu A&B, Covid) Nasopharyngeal Swab     Status: None   Collection Time: 07/30/20  3:58 PM   Specimen: Nasopharyngeal Swab; Nasopharyngeal(NP) swabs in vial transport medium  Result Value Ref Range Status   SARS Coronavirus 2 by RT PCR NEGATIVE NEGATIVE Final    Comment: (NOTE) SARS-CoV-2 target nucleic acids are NOT DETECTED.  The SARS-CoV-2 RNA is generally detectable in upper respiratory specimens during the  acute phase of infection. The lowest concentration of SARS-CoV-2 viral copies this assay can detect is 138 copies/mL. A negative result does not preclude SARS-Cov-2 infection and should not be used as the sole basis for treatment or other patient management decisions. A negative result may occur with  improper specimen collection/handling, submission of specimen other than nasopharyngeal swab, presence of viral mutation(s) within the areas targeted by this assay, and inadequate number of viral copies(<138 copies/mL). A negative result must be combined with clinical observations, patient history, and epidemiological information. The expected result is Negative.  Fact Sheet for Patients:  EntrepreneurPulse.com.au  Fact Sheet for Healthcare Providers:  IncredibleEmployment.be  This test is no t yet approved or cleared by the [place] FDA and  has been authorized for detection and/or diagnosis of SARS-CoV-2 by FDA under an Emergency Use Authorization (EUA). This EUA will remain  in effect (meaning this test can be used) for the duration of the COVID-19 declaration under Section 564(b)(1) of the Act, 21 U.S.C.section 360bbb-3(b)(1), unless the authorization is terminated  or revoked sooner.       Influenza A by PCR NEGATIVE NEGATIVE Final   Influenza B by PCR NEGATIVE NEGATIVE Final    Comment: (NOTE) The Xpert Xpress SARS-CoV-2/FLU/RSV plus assay is intended as an aid in the diagnosis of influenza from Nasopharyngeal swab specimens and should not be used as a sole basis for treatment. Nasal washings and aspirates are unacceptable for Xpert Xpress SARS-CoV-2/FLU/RSV testing.  Fact Sheet for Patients: EntrepreneurPulse.com.au  Fact Sheet for Healthcare Providers: IncredibleEmployment.be  This test is not yet approved or cleared by the [place] FDA and has been authorized for detection and/or  diagnosis of SARS-CoV-2 by FDA under an Emergency Use Authorization (EUA). This EUA will remain in effect (meaning this test can be used) for the duration of the COVID-19 declaration under Section 564(b)(1) of the Act, 21 U.S.C. section 360bbb-3(b)(1), unless the authorization is terminated or revoked.  Performed at [facility], [address] [place],  [address]   MRSA Next Gen by PCR, Nasal     Status: None   Collection Time: 07/30/20  9:45 PM   Specimen: Nasal Mucosa; Nasal Swab  Result Value Ref Range Status   MRSA by PCR Next Gen NOT DETECTED NOT DETECTED Final    Comment: (NOTE) The GeneXpert MRSA Assay (FDA approved for NASAL specimens only), is one component of a comprehensive  MRSA colonization surveillance program. It is not intended to diagnose MRSA infection nor to guide or monitor treatment for MRSA infections. Test performance is not FDA approved in patients less than 55 years old. Performed at [facility], [address] [place], [place] [address]       Scheduled Meds:  amLODipine  10 mg Oral Daily   aspirin EC  81 mg Oral Daily   atorvastatin  40 mg Oral QHS   Chlorhexidine Gluconate Cloth  6 each Topical Daily   enoxaparin (LOVENOX) injection  30 mg Subcutaneous Q24H   insulin aspart  0-15 Units Subcutaneous TID WC   insulin aspart  3 Units Subcutaneous TID WC   insulin glargine  8 Units Subcutaneous BID   losartan  50 mg Oral Daily   mouth rinse  15 mL Mouth Rinse BID   metoprolol tartrate  25 mg Oral BID   pantoprazole  40 mg Oral BID     Assessment/Plan:  Initial diabetic ketoacidosis then episodes of hypoglycemia with diabetes.  Sugars high this morning.  We will give twice daily Lantus 7 units twice a day.  Suspect noncompliance with her elevated hemoglobin A1c of 14.8.  Short acting insulin prior to meals Elevated troponin, demand ischemia.  Heparin drip discontinued last night.  On aspirin and metoprolol Acute  metabolic encephalopathy and fever.  Antibiotics stopped by ID.  Sepsis and meningitis ruled out.  Mental status much improved. Acute kidney injury on chronic kidney disease stage IIIa.  Creatinine 2.09 on presentation, down to 1.22 today. Hyponatremia secondary to DKA Lactic acidosis Accelerated hypertension on presentation and now essential hypertension on amlodipine and losartan and metoprolol Questionable hematemesis on IV Protonix.  Hemoglobin stable while on heparin drip and aspirin.  Continue Protonix Weakness.  Physical therapy recommended rehab initially patient was interested in rehab but now she is changing her mind.  In speaking with the patient's daughter, she states that she is not clear because she works 2 jobs and she cannot take care of her mom. Daily marijuana use at home    Code Status:     Code Status Orders  (From admission, onward)           Start     Ordered   07/30/20 1547  Full code  Continuous        07/30/20 1547           Code Status History     Date Active Date Inactive Code Status Order ID Comments User Context   05/21/2020 1351 05/22/2020 1742 Full Code [id number]  Kayleen Memos, DO ED   05/05/2020 1356 05/07/2020 1708 Full Code [id number]  Ivor Costa, MD ED   04/30/2020 1245 05/01/2020 2012 Full Code [id number]  CoxBriant Cedar, DO ED   02/25/2020 1649 02/27/2020 2115 DNR [id number]  [facility], Briant Cedar, DO ED      Family Communication: Spoke with patient's daughter on the phone Disposition Plan: Status is: Inpatient  Dispo: The patient is from: Home              Anticipated d/c is to: I was hoping rehab but now patient may change her mind.              Patient currently still with variable sugars.  We will try twice daily dosing on the Lantus.   Difficult to place patient.  No.  Time spent: 26 minutes  [place]

## 2020-08-02 NOTE — Progress Notes (Signed)
Patient refuses to take insulin after several attempts by this nurse.Education provided. Will continue to monitor.

## 2020-08-02 NOTE — Progress Notes (Signed)
Transfer out report given to stephanie RN. Patient is transferring to rm 138.

## 2020-08-02 NOTE — Progress Notes (Signed)
Progress Note  Patient Name: Dashay Giesler Date of Encounter: 08/02/2020  [facility] Cardiologist: Lynden Ang  Subjective   Back pain no cardiac complaints   Inpatient Medications    Scheduled Meds:  amLODipine  10 mg Oral Daily   aspirin EC  81 mg Oral Daily   atorvastatin  40 mg Oral QHS   Chlorhexidine Gluconate Cloth  6 each Topical Daily   enoxaparin (LOVENOX) injection  30 mg Subcutaneous Q24H   insulin aspart  0-15 Units Subcutaneous TID WC   insulin aspart  3 Units Subcutaneous TID WC   insulin glargine  8 Units Subcutaneous BID   ipratropium-albuterol  3 mL Nebulization Q6H   losartan  50 mg Oral Daily   mouth rinse  15 mL Mouth Rinse BID   metoprolol tartrate  25 mg Oral BID   pantoprazole  40 mg Oral BID   Continuous Infusions:   PRN Meds: acetaminophen, dextrose, labetalol, oxyCODONE   Vital Signs    Vitals:   08/02/20 0630 08/02/20 0700 08/02/20 0800 08/02/20 0900  BP:   (!) 155/98   Pulse: 84 81 84 76  Resp: 20 18 12 17   Temp:   98.4 F (36.9 C)   TempSrc:   Oral   SpO2: 96% 98% 97% 97%  Weight:      Height:        Intake/Output Summary (Last 24 hours) at 08/02/2020 0950 Last data filed at 08/01/2020 2100 Gross per 24 hour  Intake 671.5 ml  Output 600 ml  Net 71.5 ml   Last 3 Weights 07/30/2020 07/30/2020 07/18/2020  Weight (lbs) 95 lb 7.4 oz 125 lb 105 lb  Weight (kg) 43.3 kg 56.7 kg 47.628 kg      Telemetry    NSR - Personally Reviewed 08/02/2020   ECG    SR anterolateral T wave changes   Physical Exam   GEN: No acute distress.   Neck: No JVD Cardiac: RRR with 1/6 systolic murmur. Respiratory: Clear to auscultation bilaterally. GI: Soft, nontender, non-distended  MS: No edema; No deformity. Neuro:  Nonfocal  Psych: Normal affect   Labs    High Sensitivity Troponin:   Recent Labs  Lab 07/30/20 1538 07/30/20 1746 07/30/20 1957 07/30/20 2314 08/01/20 0932  TROPONINIHS 91* 792* 1,520* 2,075* 457*       Chemistry Recent Labs  Lab 07/30/20 1336 07/30/20 1617 07/31/20 0258 08/01/20 0126 08/02/20 0542  NA 129*   < > 144 137 134*  K 5.9*   < > 4.0 3.5 3.8  CL 87*   < > 110 106 101  CO2 21*   < > 26 24 28   GLUCOSE 1,188*   < > 113* 284* 379*  BUN 55*   < > 36* 25* 21  CREATININE 2.09*   < > 1.44* 1.51* 1.22*  CALCIUM 9.8   < > 8.7* 8.4* 8.5*  PROT 7.3  --   --   --   --   ALBUMIN 3.9  --   --   --   --   AST 87*  --   --   --   --   ALT 40  --   --   --   --   ALKPHOS 193*  --   --   --   --   BILITOT 1.6*  --   --   --   --   GFRNONAA 26*   < > 40* 38* 49*  ANIONGAP 21*   < >  8 7 5    < > = values in this interval not displayed.     Hematology Recent Labs  Lab 07/30/20 1336 07/30/20 1840 07/31/20 1602 08/01/20 0126 08/02/20 0542  WBC 6.7 12.2*  --  11.4*  --   RBC 3.83* 3.15*  --  3.24*  --   HGB 12.5 10.5* 10.1* 10.6* 10.0*  HCT 38.1 29.9*  --  30.6*  --   MCV 99.5 94.9  --  94.4  --   MCH 32.6 33.3  --  32.7  --   MCHC 32.8 35.1  --  34.6  --   RDW 13.1 12.7  --  13.2  --   PLT 302 251  --  220  --     BNPNo results for input(s): BNP, PROBNP in the last 168 hours.   DDimer No results for input(s): DDIMER in the last 168 hours.   Radiology    ECHOCARDIOGRAM COMPLETE  Result Date: 07/31/2020    ECHOCARDIOGRAM REPORT   Patient Name:   BREEANA SAWTELLE Date of Exam: 07/31/2020 Medical Rec #:  [id number]        Height:       62.0 in Accession #:    [id number]       Weight:       95.5 lb Date of Birth:  05-04-1955        BSA:          1.396 m Patient Age:    10 years         BP:           120/66 mmHg Patient Gender: F                HR:           82 bpm. Exam Location:  [facility] Procedure: 2D Echo, Color Doppler and Cardiac Doppler Indications:     I21.4 NSTEMI  History:         Patient has no prior history of Echocardiogram examinations.                  CAD; Risk Factors:Hypertension, Diabetes and Dyslipidemia.  Sonographer:     Charmayne Sheer RDCS (AE) Referring Phys:  [id number]  Collier Bullock Diagnosing Phys: Ida Rogue MD  Sonographer Comments: Image acquisition challenging due to uncooperative patient. Global longitudinal strain was attempted. IMPRESSIONS  1. Left ventricular ejection fraction, by estimation, is 60 to 65%. The left ventricle has normal function. The left ventricle has no regional wall motion abnormalities. There is moderate left ventricular hypertrophy. Left ventricular diastolic parameters are consistent with Grade I diastolic dysfunction (impaired relaxation). The average left ventricular global longitudinal strain is -15.7 %.  2. Right ventricular systolic function is normal. The right ventricular size is normal. There is normal pulmonary artery systolic pressure. The estimated right ventricular systolic pressure is 29.7 mmHg.  3. Tricuspid valve regurgitation is mild to moderate. FINDINGS  Left Ventricle: Left ventricular ejection fraction, by estimation, is 60 to 65%. The left ventricle has normal function. The left ventricle has no regional wall motion abnormalities. The average left ventricular global longitudinal strain is -15.7 %. The left ventricular internal cavity size was normal in size. There is moderate left ventricular hypertrophy. Left ventricular diastolic parameters are consistent with Grade I diastolic dysfunction (impaired relaxation). Right Ventricle: The right ventricular size is normal. No increase in right ventricular wall thickness. Right ventricular systolic function is normal. There is normal pulmonary artery systolic pressure. The tricuspid  regurgitant velocity is 2.52 m/s, and  with an assumed right atrial pressure of 5 mmHg, the estimated right ventricular systolic pressure is 58.0 mmHg. Left Atrium: Left atrial size was normal in size. Right Atrium: Right atrial size was normal in size. Pericardium: There is no evidence of pericardial effusion. Mitral Valve: The mitral valve is normal in structure. Trivial mitral valve regurgitation.  No evidence of mitral valve stenosis. MV peak gradient, 5.7 mmHg. The mean mitral valve gradient is 2.0 mmHg. Tricuspid Valve: The tricuspid valve is normal in structure. Tricuspid valve regurgitation is mild to moderate. No evidence of tricuspid stenosis. Aortic Valve: The aortic valve is normal in structure. Aortic valve regurgitation is not visualized. No aortic stenosis is present. Aortic valve mean gradient measures 5.0 mmHg. Aortic valve peak gradient measures 9.5 mmHg. Aortic valve area, by VTI measures 2.00 cm. Pulmonic Valve: The pulmonic valve was normal in structure. Pulmonic valve regurgitation is not visualized. No evidence of pulmonic stenosis. Aorta: The aortic root is normal in size and structure. Venous: The inferior vena cava is normal in size with greater than 50% respiratory variability, suggesting right atrial pressure of 3 mmHg. IAS/Shunts: No atrial level shunt detected by color flow Doppler.  LEFT VENTRICLE PLAX 2D LVIDd:         3.60 cm  Diastology LVIDs:         2.10 cm  LV e' medial:    5.11 cm/s LV PW:         1.10 cm  LV E/e' medial:  14.5 LV IVS:        1.00 cm  LV e' lateral:   5.55 cm/s LVOT diam:     1.90 cm  LV E/e' lateral: 13.4 LV SV:         51 LV SV Index:   37       2D Longitudinal Strain LVOT Area:     2.84 cm 2D Strain GLS Avg:     -15.7 %  RIGHT VENTRICLE RV Basal diam:  2.50 cm LEFT ATRIUM             Index       RIGHT ATRIUM           Index LA diam:        3.00 cm 2.15 cm/m  RA Area:     10.40 cm LA Vol (A2C):   32.3 ml 23.14 ml/m RA Volume:   23.50 ml  16.83 ml/m LA Vol (A4C):   38.4 ml 27.51 ml/m LA Biplane Vol: 35.6 ml 25.50 ml/m  AORTIC VALVE                   PULMONIC VALVE AV Area (Vmax):    1.91 cm    PV Vmax:       0.88 m/s AV Area (Vmean):   1.90 cm    PV Vmean:      64.700 cm/s AV Area (VTI):     2.00 cm    PV VTI:        0.157 m AV Vmax:           154.00 cm/s PV Peak grad:  3.1 mmHg AV Vmean:          99.300 cm/s PV Mean grad:  2.0 mmHg AV VTI:             0.257 m AV Peak Grad:      9.5 mmHg AV Mean Grad:      5.0  mmHg LVOT Vmax:         104.00 cm/s LVOT Vmean:        66.700 cm/s LVOT VTI:          0.181 m LVOT/AV VTI ratio: 0.70  AORTA Ao Root diam: 3.40 cm MITRAL VALVE                TRICUSPID VALVE MV Area (PHT): 3.13 cm     TR Peak grad:   25.4 mmHg MV Area VTI:   1.50 cm     TR Vmax:        252.00 cm/s MV Peak grad:  5.7 mmHg MV Mean grad:  2.0 mmHg     SHUNTS MV Vmax:       1.19 m/s     Systemic VTI:  0.18 m MV Vmean:      74.5 cm/s    Systemic Diam: 1.90 cm MV Decel Time: 242 msec MV E velocity: 74.10 cm/s MV A velocity: 103.00 cm/s MV E/A ratio:  0.72 Ida Rogue MD Electronically signed by Ida Rogue MD Signature Date/Time: 07/31/2020/12:50:29 PM    Final     Cardiac Studies   TTE (07/31/2020):  1. Left ventricular ejection fraction, by estimation, is 60 to 65%. The  left ventricle has normal function. The left ventricle has no regional  wall motion abnormalities. There is moderate left ventricular hypertrophy.  Left ventricular diastolic  parameters are consistent with Grade I diastolic dysfunction (impaired  relaxation). The average left ventricular global longitudinal strain is  -15.7 %.   2. Right ventricular systolic function is normal. The right ventricular  size is normal. There is normal pulmonary artery systolic pressure. The  estimated right ventricular systolic pressure is 38.7 mmHg.   3. Tricuspid valve regurgitation is mild to moderate.   Patient Profile     65 y.o. female with h/o DM complicated by DKA, hypertension, bipolar disorder, and multiple ED visits/admission with altered mental status, admitted with altered mental status in the setting of fever and markedly elevated blood sugar.  She was incidentally found to have elevated troponin.  Assessment & Plan    Elevated troponin, suspect demand ischemia: - Troponin now down significantly 400 range No chest pain Echo with normal EF and no  RWMAls no plan for  further w/u Outpatient lexiscan myovue likely with Dr Rockey Situ   Fever and AMS: Initial concern for meningitis, though suspicion is less at this point. Antibiotics and ongoing workup per IM and ID.  Hypertension: BP labile but overall improved. Continue current doses of amlodipine, losartan, and metoprolol.  Diabetes mellitus/DKA: Per IM.  Importance of medication compliance reinforced.  Cardiology will sign off Outpatient f/u Dr Rockey Situ   Signed, Jenkins Rouge, MD  08/02/2020, 9:50 AM   Patient ID: Deetta Perla, female   DOB: 12/03/1955, 65 y.o.   MRN: [id number]

## 2020-08-03 LAB — GLUCOSE, CAPILLARY
Glucose-Capillary: 168 mg/dL — ABNORMAL HIGH (ref 70–99)
Glucose-Capillary: 217 mg/dL — ABNORMAL HIGH (ref 70–99)
Glucose-Capillary: 221 mg/dL — ABNORMAL HIGH (ref 70–99)
Glucose-Capillary: 168 mg/dL — ABNORMAL HIGH (ref 70–99)

## 2020-08-03 LAB — BASIC METABOLIC PANEL
Anion gap: 7 (ref 5–15)
BUN: 22 mg/dL (ref 8–23)
CO2: 26 mmol/L (ref 22–32)
Calcium: 8.6 mg/dL — ABNORMAL LOW (ref 8.9–10.3)
Chloride: 104 mmol/L (ref 98–111)
Creatinine, Ser: 1.15 mg/dL — ABNORMAL HIGH (ref 0.44–1.00)
GFR, Estimated: 53 mL/min — ABNORMAL LOW (ref >60)
Glucose, Bld: 128 mg/dL — ABNORMAL HIGH (ref 70–99)
Potassium: 3.8 mmol/L (ref 3.5–5.1)
Sodium: 137 mmol/L (ref 135–145)

## 2020-08-03 MED ORDER — AMLODIPINE BESYLATE 10 MG PO TABS: 10.0000 mg | ORAL_TABLET | Freq: Every day | ORAL | 0 refills | Status: DC

## 2020-08-03 MED ORDER — INSULIN PEN NEEDLE 32G X 4 MM MISC: 1.0000 | Freq: Three times a day (TID) | 0 refills | Status: AC

## 2020-08-03 MED ORDER — PANTOPRAZOLE SODIUM 40 MG PO TBEC: 40.0000 mg | DELAYED_RELEASE_TABLET | Freq: Two times a day (BID) | ORAL | 0 refills | Status: AC

## 2020-08-03 MED ORDER — METOPROLOL TARTRATE 25 MG PO TABS: 25.0000 mg | ORAL_TABLET | Freq: Two times a day (BID) | ORAL | 0 refills | Status: DC

## 2020-08-03 MED ORDER — INSULIN LISPRO (1 UNIT DIAL) 100 UNIT/ML (KWIKPEN): 5.0000 [IU] | PEN_INJECTOR | Freq: Three times a day (TID) | SUBCUTANEOUS | 0 refills | Status: DC

## 2020-08-03 MED ORDER — FAMOTIDINE 20 MG PO TABS
20.0000 mg | ORAL_TABLET | Freq: Every day | ORAL | 0 refills | Status: DC
Start: 2020-08-03 — End: 2020-09-18

## 2020-08-03 MED ORDER — ATORVASTATIN CALCIUM 40 MG PO TABS: 40.0000 mg | ORAL_TABLET | Freq: Every day | ORAL | 0 refills | Status: DC

## 2020-08-03 MED ORDER — ASPIRIN 81 MG PO TBEC: 81.0000 mg | DELAYED_RELEASE_TABLET | Freq: Every day | ORAL | 0 refills | Status: AC

## 2020-08-03 MED ORDER — LOSARTAN POTASSIUM 50 MG PO TABS: 50.0000 mg | ORAL_TABLET | Freq: Every day | ORAL | 0 refills | Status: DC

## 2020-08-03 MED ORDER — LANTUS SOLOSTAR 100 UNIT/ML ~~LOC~~ SOPN: 8.0000 [IU] | PEN_INJECTOR | Freq: Two times a day (BID) | SUBCUTANEOUS | 0 refills | Status: DC

## 2020-08-03 NOTE — Progress Notes (Signed)
Pt verbalized understanding of discharge instructions. Patient wanted staff to make follow up appts and have offices call her. Pt escorted out via wheelchair by staff. Transported home via [employer]

## 2020-08-03 NOTE — Discharge Summary (Signed)
[facility] NAME: Erica Mercer    MR#:  [id number]  DATE OF BIRTH:  07/29/1955  DATE OF ADMISSION:  07/30/2020 ADMITTING PHYSICIAN: Collier Bullock, MD  DATE OF DISCHARGE: 08/03/2020  PRIMARY CARE PHYSICIAN: [name], NP    ADMISSION DIAGNOSIS:  DKA (diabetic ketoacidosis) ([place]) [E11.10] Acute upper GI bleed [K92.2] Sepsis after obstetrical procedure [O86.04] Hyperosmolar hyperglycemic state (HHS) ([place]) [E11.00, E11.65]  DISCHARGE DIAGNOSIS:  Principal Problem:   DKA (diabetic ketoacidosis) ([place]) Active Problems:   Essential hypertension   Acute kidney injury superimposed on CKD ([place])   NSTEMI (non-ST elevated myocardial infarction) ([place])   Sepsis ([place])   CAD (coronary artery disease)   Tobacco abuse   Hyperkalemia   Hyponatremia   Acute metabolic encephalopathy   Hypertensive urgency   Hematemesis of unknown etiology   Meningitis   Lactic acidosis   Type 2 diabetes mellitus with hypoglycemia without coma, with long-term current use of insulin (HCC)   Elevated troponin   Weakness   SECONDARY DIAGNOSIS:   Past Medical History:  Diagnosis Date   Bipolar 1 disorder ([place])    CAD (coronary artery disease)    Diabetes mellitus without complication ([place])    HLD (hyperlipidemia)    HTN (hypertension)     HOSPITAL COURSE:   Diabetic ketoacidosis with type 2 diabetes.  Patient also had episodes of hypoglycemia after coming off insulin drip.  Lantus insulin adjusted to 8 units twice a day.  Short acting insulin prior to meals.  Both of these prescription sent into her pharmacy.  Suspect noncompliance with a hemoglobin A1c of 14.8.  In speaking with the patient's daughter she does not have any help at home and needs to do her insulin on her own.  We will set up home health PT and RN. Elevated troponin, demand ischemia.  Patient was on heparin drip for 48 hours.  Seen by cardiology.  No further work-up needed.   Troponin did go up in the 2000's.  Continue aspirin and metoprolol. Acute metabolic encephalopathy and fever.  Sepsis and meningitis ruled out.  Initially in the emergency room they suspected meningitis and an LP was attempted but unable to get any fluid.  The patient had no meningeal signs when I saw her the next day.  Consulted infectious disease and we stopped antibiotics.  Mental status improved back to baseline likely secondary to diabetic ketoacidosis. Acute kidney injury on chronic kidney disease stage IIIa.  Creatinine 2.09 on presentation and came down to 1.15 upon discharge. Hyponatremia secondary to DKA.  Sodium 129 on presentation and in the normal range 137 upon discharge. Accelerated hypertension on presentation.  Restarted on amlodipine, losartan and metoprolol was added Questionable hematemesis on presentation.  The patient was on IV Protonix initially.  Hemoglobin was stable while on heparin drip and aspirin. Weakness.  Initial physical therapy recommended rehab.  On the day of discharge she walked around the nursing station twice with a walker.  Physical therapy change the recommendations to home with home health.  Patient was discharged home. Daily marijuana use at home Patient high risk for readmission if she does not take her insulin as directed.  DISCHARGE CONDITIONS:   Fair.  CONSULTS OBTAINED:  Treatment Team:  Tsosie Billing, MD  DRUG ALLERGIES:  No Known Allergies  DISCHARGE MEDICATIONS:   Allergies as of 08/03/2020   No Known Allergies      Medication List     STOP taking these  medications    aluminum-magnesium hydroxide-simethicone 200-200-20 MG/5ML Susp Commonly known as: MAALOX   nicotine polacrilex 4 MG gum Commonly known as: NICORETTE   omeprazole 40 MG capsule Commonly known as: PRILOSEC   ondansetron 4 MG disintegrating tablet Commonly known as: Zofran ODT   pregabalin 50 MG capsule Commonly known as: LYRICA   Rexulti 1 MG Tabs  tablet Generic drug: brexpiprazole       TAKE these medications    albuterol 108 (90 Base) MCG/ACT inhaler Commonly known as: VENTOLIN HFA Inhale 2 puffs into the lungs every 6 (six) hours as needed for wheezing.   amLODipine 10 MG tablet Commonly known as: NORVASC Take 1 tablet (10 mg total) by mouth daily.   aspirin 81 MG EC tablet Take 1 tablet (81 mg total) by mouth daily. Swallow whole. Start taking on: August 04, 2020   atorvastatin 40 MG tablet Commonly known as: LIPITOR Take 1 tablet (40 mg total) by mouth at bedtime.   famotidine 20 MG tablet Commonly known as: PEPCID Take 1 tablet (20 mg total) by mouth at bedtime. What changed: when to take this   insulin lispro 100 UNIT/ML KwikPen Commonly known as: HUMALOG Inject 5 Units into the skin 3 (three) times daily before meals.   Insulin Pen Needle 32G X 4 MM Misc 1 Dose by Does not apply route 4 (four) times daily -  before meals and at bedtime.   Lantus SoloStar 100 UNIT/ML Solostar Pen Generic drug: insulin glargine Inject 8 Units into the skin 2 (two) times daily. What changed:  how much to take when to take this   losartan 50 MG tablet Commonly known as: COZAAR Take 1 tablet (50 mg total) by mouth daily.   metoCLOPramide 10 MG tablet Commonly known as: REGLAN Take 1 tablet (10 mg total) by mouth every 6 (six) hours as needed.   metoprolol tartrate 25 MG tablet Commonly known as: LOPRESSOR Take 1 tablet (25 mg total) by mouth 2 (two) times daily.   mirtazapine 30 MG tablet Commonly known as: REMERON Take 1 tablet (30 mg total) by mouth at bedtime.   pantoprazole 40 MG tablet Commonly known as: PROTONIX Take 1 tablet (40 mg total) by mouth 2 (two) times daily.   polyethylene glycol 17 g packet Commonly known as: MIRALAX / GLYCOLAX Take 34 g by mouth daily. Notes to patient: Not given in hospital   sucralfate 1 GM/10ML suspension Commonly known as: Carafate Take 10 mLs (1 g total) by mouth 4  (four) times daily. Notes to patient: Not given in hospital         DISCHARGE INSTRUCTIONS:   Follow-up PMD 5 days Follow-up cardiology 2 weeks  If you experience worsening of your admission symptoms, develop shortness of breath, life threatening emergency, suicidal or homicidal thoughts you must seek medical attention immediately by calling 911 or calling your MD immediately  if symptoms less severe.  You Must read complete instructions/literature along with all the possible adverse reactions/side effects for all the Medicines you take and that have been prescribed to you. Take any new Medicines after you have completely understood and accept all the possible adverse reactions/side effects.   Please note  You were cared for by a hospitalist during your hospital stay. If you have any questions about your discharge medications or the care you received while you were in the hospital after you are discharged, you can call the unit and asked to speak with the hospitalist on call if the  hospitalist that took care of you is not available. Once you are discharged, your primary care physician will handle any further medical issues. Please note that NO REFILLS for any discharge medications will be authorized once you are discharged, as it is imperative that you return to your primary care physician (or establish a relationship with a primary care physician if you do not have one) for your aftercare needs so that they can reassess your need for medications and monitor your lab values.    Today   CHIEF COMPLAINT:   Chief Complaint  Patient presents with   Emesis    HISTORY OF PRESENT ILLNESS:  Erica Mercer  is a 65 y.o. female came in with vomiting   VITAL SIGNS:  Blood pressure (!) 139/113, pulse 67, temperature 98.8 F (37.1 C), resp. rate 18, height 5\' 2"  (1.575 m), weight 43.3 kg, SpO2 100 %.  Blood pressure 156/90 prior to disposition  I/O:  No intake or output data in the 24  hours ending 08/03/20 1650  PHYSICAL EXAMINATION:  GENERAL:  65 y.o.-year-old patient lying in the bed with no acute distress.  EYES: Pupils equal, round, reactive to light and accommodation. No scleral icterus.   HEENT: Head atraumatic, normocephalic. Oropharynx and nasopharynx clear.  LUNGS: Normal breath sounds bilaterally, no wheezing, rales,rhonchi or crepitation. No use of accessory muscles of respiration.  CARDIOVASCULAR: S1, S2 normal. No murmurs, rubs, or gallops.  ABDOMEN: Soft, non-tender, non-distended.  EXTREMITIES: No pedal edema.  NEUROLOGIC: Cranial nerves II through XII are intact. Muscle strength 5/5 in all extremities. Sensation intact. Gait not checked.  PSYCHIATRIC: The patient is alert and oriented x 3.  SKIN: No obvious rash, lesion, or ulcer.   DATA REVIEW:   CBC Recent Labs  Lab 08/01/20 0126 08/02/20 0542  WBC 11.4*  --   HGB 10.6* 10.0*  HCT 30.6*  --   PLT 220  --     Chemistries  Recent Labs  Lab 07/30/20 1336 07/30/20 1617 08/03/20 0430  NA 129*   < > 137  K 5.9*   < > 3.8  CL 87*   < > 104  CO2 21*   < > 26  GLUCOSE 1,188*   < > 128*  BUN 55*   < > 22  CREATININE 2.09*   < > 1.15*  CALCIUM 9.8   < > 8.6*  AST 87*  --   --   ALT 40  --   --   ALKPHOS 193*  --   --   BILITOT 1.6*  --   --    < > = values in this interval not displayed.     Microbiology Results  Results for orders placed or performed during the hospital encounter of 07/30/20  Culture, blood (single)     Status: None (Preliminary result)   Collection Time: 07/30/20  3:38 PM   Specimen: BLOOD  Result Value Ref Range Status   Specimen Description BLOOD BLOOD LEFT FOREARM  Final   Special Requests   Final    BOTTLES DRAWN AEROBIC AND ANAEROBIC Blood Culture results may not be optimal due to an inadequate volume of blood received in culture bottles   Culture   Final    NO GROWTH 4 DAYS Performed at [facility], [address] [place], [place] [address]     Report Status PENDING  Incomplete  Resp Panel by RT-PCR (Flu A&B, Covid) Nasopharyngeal Swab     Status: None   Collection  Time: 07/30/20  3:58 PM   Specimen: Nasopharyngeal Swab; Nasopharyngeal(NP) swabs in vial transport medium  Result Value Ref Range Status   SARS Coronavirus 2 by RT PCR NEGATIVE NEGATIVE Final    Comment: (NOTE) SARS-CoV-2 target nucleic acids are NOT DETECTED.  The SARS-CoV-2 RNA is generally detectable in upper respiratory specimens during the acute phase of infection. The lowest concentration of SARS-CoV-2 viral copies this assay can detect is 138 copies/mL. A negative result does not preclude SARS-Cov-2 infection and should not be used as the sole basis for treatment or other patient management decisions. A negative result may occur with  improper specimen collection/handling, submission of specimen other than nasopharyngeal swab, presence of viral mutation(s) within the areas targeted by this assay, and inadequate number of viral copies(<138 copies/mL). A negative result must be combined with clinical observations, patient history, and epidemiological information. The expected result is Negative.  Fact Sheet for Patients:  EntrepreneurPulse.com.au  Fact Sheet for Healthcare Providers:  IncredibleEmployment.be  This test is no t yet approved or cleared by the [place] FDA and  has been authorized for detection and/or diagnosis of SARS-CoV-2 by FDA under an Emergency Use Authorization (EUA). This EUA will remain  in effect (meaning this test can be used) for the duration of the COVID-19 declaration under Section 564(b)(1) of the Act, 21 U.S.C.section 360bbb-3(b)(1), unless the authorization is terminated  or revoked sooner.       Influenza A by PCR NEGATIVE NEGATIVE Final   Influenza B by PCR NEGATIVE NEGATIVE Final    Comment: (NOTE) The Xpert Xpress SARS-CoV-2/FLU/RSV plus assay is intended as an aid in the  diagnosis of influenza from Nasopharyngeal swab specimens and should not be used as a sole basis for treatment. Nasal washings and aspirates are unacceptable for Xpert Xpress SARS-CoV-2/FLU/RSV testing.  Fact Sheet for Patients: EntrepreneurPulse.com.au  Fact Sheet for Healthcare Providers: IncredibleEmployment.be  This test is not yet approved or cleared by the [place] FDA and has been authorized for detection and/or diagnosis of SARS-CoV-2 by FDA under an Emergency Use Authorization (EUA). This EUA will remain in effect (meaning this test can be used) for the duration of the COVID-19 declaration under Section 564(b)(1) of the Act, 21 U.S.C. section 360bbb-3(b)(1), unless the authorization is terminated or revoked.  Performed at [facility], [address] [place], [place] [address]   MRSA Next Gen by PCR, Nasal     Status: None   Collection Time: 07/30/20  9:45 PM   Specimen: Nasal Mucosa; Nasal Swab  Result Value Ref Range Status   MRSA by PCR Next Gen NOT DETECTED NOT DETECTED Final    Comment: (NOTE) The GeneXpert MRSA Assay (FDA approved for NASAL specimens only), is one component of a comprehensive MRSA colonization surveillance program. It is not intended to diagnose MRSA infection nor to guide or monitor treatment for MRSA infections. Test performance is not FDA approved in patients less than 48 years old. Performed at [facility], [address], [place], [place] [address]      Management plans discussed with the patient, family and they are in agreement.  CODE STATUS:     Code Status Orders  (From admission, onward)           Start     Ordered   07/30/20 1547  Full code  Continuous        07/30/20 1547           Code Status History  Date Active Date Inactive Code Status Order ID Comments User Context   05/21/2020 1351 05/22/2020 1742 Full Code [id number]  Kayleen Memos, DO ED    05/05/2020 1356 05/07/2020 1708 Full Code [id number]  Ivor Costa, MD ED   04/30/2020 1245 05/01/2020 2012 Full Code [id number]  CoxBriant Cedar, DO ED   02/25/2020 1649 02/27/2020 2115 DNR [id number]  Cox, Amy Delane Ginger, DO ED       TOTAL TIME TAKING CARE OF THIS PATIENT: 35 minutes.    Loletha Grayer M.D on 08/03/2020 at 4:50 PM  Between 7am to 6pm - Pager - [phone]  After 6pm go to [email or web address] - password [email or web address]  [facility]  CC: Primary care physician; [name], NP

## 2020-08-03 NOTE — Progress Notes (Signed)
Pt given eduction on insulin administration. Pt refused to give herself the insulin. She states that she uses a pen at home. RN discussed how to draw up and dial the pen. RN educated pt on how to give insulin with a syringe and discard of needle.

## 2020-08-03 NOTE — Progress Notes (Addendum)
Physical Therapy Treatment Patient Details Name: Erica Mercer MRN: [id number] DOB: Jan 09, 1956 Today's Date: 08/03/2020    History of Present Illness Pt admitted to [facility]20 for c/o abdominal pain and vomiting, elevated blood sugar in 600's; felt to have sepsis vs. DKA. Per cardiology consult, elevated troponin likely due to demand ischemia. PMH significant for: DM, HTN, CAD, bipolar disorder, hx drug use, and medication non-compliance.    PT Comments    Patient with significant improvement in independence, safety awareness, and activity tolerance this session. Patient ambulated 2 laps around nursing station with rolling walker and supervision for safety. Encouraged patient to use rolling walker for ambulation , especially  longer distance ambulation for safety and fall prevention. Good dynamic standing balance demonstrated during functional tasks without UE support. No physical assistance required during functional mobility. Discharge recommendation updated for home with HHPT.  Patient could also likely benefit from medication management at discharge as she reports non-compliance with medication administration prior to this hospital admission.    Follow Up Recommendations  Home health PT     Equipment Recommendations  None recommended by PT    Recommendations for Other Services       Precautions / Restrictions Precautions Precautions: Fall Restrictions Weight Bearing Restrictions: No    Mobility  Bed Mobility Overal bed mobility: Modified Independent Bed Mobility: Supine to Sit;Sit to Supine     Supine to sit: Modified independent (Device/Increase time) Sit to supine: Independent   General bed mobility comments: extra time required to complete tasks with no physical assistance required for mobility    Transfers Overall transfer level: Needs assistance Equipment used: None Transfers: Sit to/from Stand Sit to Stand: Supervision         General transfer  comment: supervision for safety with no loss of balance with transfers  Ambulation/Gait Ambulation/Gait assistance: Supervision Gait Distance (Feet): 320 Feet Assistive device: Rolling walker (2 wheeled) Gait Pattern/deviations: Step-through pattern;Narrow base of support Gait velocity: decreased   General Gait Details: patient ambulated 2 laps around nursing station with rolling walker for support. no shortness of breath noted, no pain or dizziness reported. educated patient to use rolling walker for safety and fall prevention especially with longer distance ambulation.   Stairs             Wheelchair Mobility    Modified Rankin (Stroke Patients Only)       Balance           Standing balance support: No upper extremity supported Standing balance-Leahy Scale: Good Standing balance comment: patient is able to reach outside base of support with no UE support without loss of balance during functional tasks, including adjusting her blankets/sheets before getting back into bed after walking.                            Cognition Arousal/Alertness: Awake/alert Behavior During Therapy: WFL for tasks assessed/performed;Impulsive Overall Cognitive Status: Within Functional Limits for tasks assessed                                 General Comments: patient able to follow all commands without difficulty. patient is easily distracted and needs cues for attention to task      Exercises      General Comments        Pertinent Vitals/Pain Pain Assessment: No/denies pain    Home Living  Prior Function            PT Goals (current goals can now be found in the care plan section) Acute Rehab PT Goals Patient Stated Goal: to go home PT Goal Formulation: With patient Time For Goal Achievement: 08/15/20 Potential to Achieve Goals: Good Progress towards PT goals: Progressing toward goals    Frequency    Min  2X/week      PT Plan Discharge plan needs to be updated    Co-evaluation              AM-PAC PT "6 Clicks" Mobility   Outcome Measure  Help needed turning from your back to your side while in a flat bed without using bedrails?: None Help needed moving from lying on your back to sitting on the side of a flat bed without using bedrails?: None Help needed moving to and from a bed to a chair (including a wheelchair)?: A Little Help needed standing up from a chair using your arms (e.g., wheelchair or bedside chair)?: A Little Help needed to walk in hospital room?: A Little Help needed climbing 3-5 steps with a railing? : A Little 6 Click Score: 20    End of Session         PT Visit Diagnosis: Unsteadiness on feet (R26.81);History of falling (Z91.81);Muscle weakness (generalized) (M62.81)     Time: 3700-5259 PT Time Calculation (min) (ACUTE ONLY): 24 min  Charges:  $Gait Training: 8-22 mins $Therapeutic Activity: 8-22 mins                     Minna Merritts, PT, MPT   Percell Locus 08/03/2020, 10:13 AM

## 2020-08-03 NOTE — TOC Progression Note (Addendum)
Transition of Care [facility]) - Progression Note    Patient Details  Name: Norman Bier MRN: [id number] Date of Birth: [birth year]  [facility] CM/SW Contact  Izola Price, RN Phone Number: 08/03/2020, 1:29 PM  Clinical Narrative: Discharging with [facility] rec/orders for PT/OT/RN. Spoke with patient and confirmed she has needed DME at home. She admits need for further diabetes/insulin learning and RN was on [facility] orders. She has grandchildren that help her, ages 65 and 76. When asked if she felt safe going to her home, she said yes. She is able to get to pharmacy 2 blocks away ([facility] [address] in [place]). She has an emotional support person from [employer] that can take her to appointments Monday through Friday. PCP is Dr. Christoper Fabian. She does not have a ride home today as her daugther is at work and cannot provide a ride. Pending acceptance of [place] services from [facility]. Simmie Davies RN [facility] accepted. Start of service 10/06/20. Updated CN. Staff arranged transportation home with [employer] taxi. Simmie Davies RN CM        Expected Discharge Plan and Services           Expected Discharge Date: 08/03/20                                     Social Determinants of Health (SDOH) Interventions    Readmission Risk Interventions No flowsheet data found.

## 2020-08-04 LAB — CULTURE, BLOOD (SINGLE): Culture: NO GROWTH

## 2020-08-05 LAB — HGB FRACTIONATION CASCADE
Hgb A2: 2.2 % (ref 1.8–3.2)
Hgb A: 97.8 % (ref 96.4–98.8)
Hgb F: 0 % (ref 0.0–2.0)
Hgb S: 0 %

## 2020-08-08 ENCOUNTER — Other Ambulatory Visit: Payer: Self-pay

## 2020-08-08 ENCOUNTER — Emergency Department
Admission: EM | Admit: 2020-08-08 | Discharge: 2020-08-08 | Disposition: A | Discharge status: Home / Self Care | Payer: Medicare Other | Attending: Emergency Medicine | Admitting: Emergency Medicine

## 2020-08-08 ENCOUNTER — Encounter: Payer: Self-pay | Admitting: Emergency Medicine

## 2020-08-08 DIAGNOSIS — I129 Hypertensive chronic kidney disease with stage 1 through stage 4 chronic kidney disease, or unspecified chronic kidney disease: Secondary | ICD-10-CM | POA: Insufficient documentation

## 2020-08-08 DIAGNOSIS — J449 Chronic obstructive pulmonary disease, unspecified: Secondary | ICD-10-CM | POA: Diagnosis not present

## 2020-08-08 DIAGNOSIS — F32A Depression, unspecified: Secondary | ICD-10-CM | POA: Insufficient documentation

## 2020-08-08 DIAGNOSIS — Z794 Long term (current) use of insulin: Secondary | ICD-10-CM | POA: Insufficient documentation

## 2020-08-08 DIAGNOSIS — F17200 Nicotine dependence, unspecified, uncomplicated: Secondary | ICD-10-CM | POA: Insufficient documentation

## 2020-08-08 DIAGNOSIS — I2510 Atherosclerotic heart disease of native coronary artery without angina pectoris: Secondary | ICD-10-CM | POA: Diagnosis not present

## 2020-08-08 DIAGNOSIS — N1831 Chronic kidney disease, stage 3a: Secondary | ICD-10-CM | POA: Insufficient documentation

## 2020-08-08 DIAGNOSIS — Z79899 Other long term (current) drug therapy: Secondary | ICD-10-CM | POA: Insufficient documentation

## 2020-08-08 DIAGNOSIS — E1122 Type 2 diabetes mellitus with diabetic chronic kidney disease: Secondary | ICD-10-CM | POA: Insufficient documentation

## 2020-08-08 DIAGNOSIS — Z7982 Long term (current) use of aspirin: Secondary | ICD-10-CM | POA: Insufficient documentation

## 2020-08-08 NOTE — ED Provider Notes (Signed)
[facility] Emergency Department Provider Note  Time seen: 5:10 PM  I have reviewed the triage vital signs and the nursing notes.   HISTORY  Chief Complaint Depression and Suicidal  HPI Erica Mercer is a 65 y.o. female with a past medical history of bipolar, CAD, diabetes, hypertension, hyperlipidemia, presents to the emergency department for depression.  According to the patient she has been feeling somewhat depressed recently and had made a statement to someone about having thoughts of hurting herself.  Patient states she was not serious when she said this, states she has no plan or intention to hurt herself or anybody else.  She states that person called the police and made her come here for an evaluation.  Patient states she already has an outpatient support group.  Patient denies any suicidal homicidal ideation currently.  Patient has no medical complaints today.  Denies any drugs or alcohol.   Past Medical History:  Diagnosis Date   Bipolar 1 disorder ([place])    CAD (coronary artery disease)    Diabetes mellitus without complication ([place])    HLD (hyperlipidemia)    HTN (hypertension)     Patient Active Problem List   Diagnosis Date Noted   Type 2 diabetes mellitus with hypoglycemia without coma, with long-term current use of insulin (HCC)    Elevated troponin    Weakness    Lactic acidosis    Hyperkalemia 07/30/2020   Hyponatremia 32/12/2480   Acute metabolic encephalopathy 50/03/7046   Hypertensive urgency 07/30/2020   Hematemesis of unknown etiology 07/30/2020   Meningitis 07/30/2020   Stage 3a chronic kidney disease (HCC)    CAD (coronary artery disease) 05/05/2020   Hyperosmolar hyperglycemic state (HHS) ([place]) 05/05/2020   Abdominal pain 05/05/2020   GERD (gastroesophageal reflux disease) 05/05/2020   Tobacco abuse 05/05/2020   Left arm numbness 05/05/2020   Depression 05/05/2020   Hyperglycemia    AKI (acute kidney injury) ([place])  04/30/2020   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[place]) 04/30/2020   Sepsis ([place]) 04/30/2020   DKA, type 1 ([place]) 02/26/2020   Acute kidney injury superimposed on CKD ([place])    DKA (diabetic ketoacidosis) ([place]) 02/25/2020   Malnutrition ([place]) 02/25/2020   Essential hypertension 02/25/2020   HLD (hyperlipidemia) 02/25/2020   Soft tissue mass 02/25/2020   H/O cocaine abuse ([place]) 02/17/2018   Bipolar 1 disorder ([place]) 04/22/2016   NSTEMI (non-ST elevated myocardial infarction) ([place]) 04/22/2016   COPD (chronic obstructive pulmonary disease) ([place]) 01/22/2016    Past Surgical History:  Procedure Laterality Date   COLONOSCOPY     ESOPHAGOGASTRODUODENOSCOPY      Prior to Admission medications   Medication Sig Start Date End Date Taking? Authorizing Provider  albuterol (VENTOLIN HFA) 108 (90 Base) MCG/ACT inhaler Inhale 2 puffs into the lungs every 6 (six) hours as needed for wheezing. 05/07/20 05/02/21  Loletha Grayer, MD  amLODipine (NORVASC) 10 MG tablet Take 1 tablet (10 mg total) by mouth daily. 08/03/20 09/02/20  Loletha Grayer, MD  aspirin EC 81 MG EC tablet Take 1 tablet (81 mg total) by mouth daily. Swallow whole. 08/04/20   Loletha Grayer, MD  atorvastatin (LIPITOR) 40 MG tablet Take 1 tablet (40 mg total) by mouth at bedtime. 08/03/20 09/02/20  Loletha Grayer, MD  famotidine (PEPCID) 20 MG tablet Take 1 tablet (20 mg total) by mouth at bedtime. 08/03/20   Loletha Grayer, MD  insulin glargine (LANTUS SOLOSTAR) 100 UNIT/ML Solostar Pen Inject 8 Units into the skin 2 (two)  times daily. 08/03/20 09/02/20  Loletha Grayer, MD  insulin lispro (HUMALOG) 100 UNIT/ML KwikPen Inject 5 Units into the skin 3 (three) times daily before meals. 08/03/20   Loletha Grayer, MD  Insulin Pen Needle 32G X 4 MM MISC 1 Dose by Does not apply route 4 (four) times daily -  before meals and at bedtime. 08/03/20   Loletha Grayer, MD  losartan (COZAAR) 50 MG tablet Take 1 tablet (50 mg total) by mouth daily.  08/03/20 09/02/20  Loletha Grayer, MD  metoCLOPramide (REGLAN) 10 MG tablet Take 1 tablet (10 mg total) by mouth every 6 (six) hours as needed. 06/16/20   Carrie Mew, MD  metoprolol tartrate (LOPRESSOR) 25 MG tablet Take 1 tablet (25 mg total) by mouth 2 (two) times daily. 08/03/20   Loletha Grayer, MD  mirtazapine (REMERON) 30 MG tablet Take 1 tablet (30 mg total) by mouth at bedtime. 05/07/20   Loletha Grayer, MD  pantoprazole (PROTONIX) 40 MG tablet Take 1 tablet (40 mg total) by mouth 2 (two) times daily. 08/03/20   Wieting, Richard, MD  polyethylene glycol (MIRALAX / GLYCOLAX) 17 g packet Take 34 g by mouth daily. 05/22/20   Enzo Bi, MD  sucralfate (CARAFATE) 1 GM/10ML suspension Take 10 mLs (1 g total) by mouth 4 (four) times daily. 06/03/20 06/03/21  Naaman Plummer, MD    No Known Allergies  Family History  Problem Relation Age of Onset   Heart disease Mother    Prostate cancer Father     Social History Social History   Tobacco Use   Smoking status: Every Day   Smokeless tobacco: Never  Vaping Use   Vaping Use: Never used  Substance Use Topics   Alcohol use: Not Currently   Drug use: Yes    Frequency: 3.0 times per week    Types: Marijuana    Review of Systems Constitutional: Negative for fever. Cardiovascular: Negative for chest pain. Respiratory: Negative for shortness of breath. Gastrointestinal: Negative for abdominal pain Musculoskeletal: Negative for musculoskeletal complaint Neurological: Negative for headache All other ROS negative  ____________________________________________   PHYSICAL EXAM:  Constitutional: Alert and oriented. Well appearing and in no distress. Eyes: Normal exam ENT      Head: Normocephalic and atraumatic.      Mouth/Throat: Mucous membranes are moist. Cardiovascular: Normal rate, regular rhythm.  Respiratory: Normal respiratory effort without tachypnea nor retractions. Breath sounds are clear  Gastrointestinal: Soft and  nontender. No distention Musculoskeletal: Nontender with normal range of motion in all extremities.  Neurologic:  Normal speech and language. No gross focal neurologic deficits Skin:  Skin is warm, dry and intact.  Psychiatric: Mood and affect are normal.  Denies suicidal or homicidal ideation  ____________________________________________    INITIAL IMPRESSION / ASSESSMENT AND PLAN / ED COURSE  Pertinent labs & imaging results that were available during my care of the patient were reviewed by me and considered in my medical decision making (see chart for details).   Patient presents to the emergency department for depression, states she was told by police she needs to come to the emergency department or they will take papers out to bring her to the emergency department.  Here patient is calm and cooperative throughout my conversation with her.  Has no medical complaints today.  Patient states that time she does get depressed because of her living situation, states she oftentimes has arguments with her daughter who she lives with.  Currently patient denies any suicidal homicidal ideation.  States  she was never serious about hurting herself or anybody else.  Patient has outpatient resources available to her.  As the patient appears well, appears rational and does not appear to be a threat herself or anyone else.  I believe the patient would be safe for discharge home with her outpatient follow-up.  I did discussed with the patient if she were to change her mind and want to stay for an evaluation she should return to the emergency department immediately.  Amandeep Hogston was evaluated in Emergency Department on 08/08/2020 for the symptoms described in the history of present illness. She was evaluated in the context of the global COVID-19 pandemic, which necessitated consideration that the patient might be at risk for infection with the SARS-CoV-2 virus that causes COVID-19. Institutional protocols and  algorithms that pertain to the evaluation of patients at risk for COVID-19 are in a state of rapid change based on information released by regulatory bodies including the CDC and federal and state organizations. These policies and algorithms were followed during the patient's care in the ED.  ____________________________________________   FINAL CLINICAL IMPRESSION(S) / ED DIAGNOSES  Depression   Harvest Dark, MD 08/08/20 1713

## 2020-08-08 NOTE — Discharge Instructions (Addendum)
You have been seen in the emergency department for a  psychiatric concern. Please follow-up with your outpatient resources provided. Return to the emergency department for any worsening symptoms, or any thoughts of hurting yourself or anyone else so that we may attempt to help you.

## 2020-08-08 NOTE — ED Triage Notes (Signed)
Pt states that she was discharge on Sunday and she has felt depressed. Dr. Kerman Passey in to speak to pt. Pt denies any SI, she states that she told a family member that she was depressed and they called the police on her.

## 2020-08-14 ENCOUNTER — Emergency Department
Admission: EM | Admit: 2020-08-14 | Discharge: 2020-08-14 | Disposition: A | Discharge status: Home / Self Care | Payer: Medicare Other | Attending: Emergency Medicine | Admitting: Emergency Medicine

## 2020-08-14 ENCOUNTER — Other Ambulatory Visit: Payer: Self-pay

## 2020-08-14 DIAGNOSIS — Z7982 Long term (current) use of aspirin: Secondary | ICD-10-CM | POA: Insufficient documentation

## 2020-08-14 DIAGNOSIS — J449 Chronic obstructive pulmonary disease, unspecified: Secondary | ICD-10-CM | POA: Diagnosis not present

## 2020-08-14 DIAGNOSIS — R739 Hyperglycemia, unspecified: Secondary | ICD-10-CM

## 2020-08-14 DIAGNOSIS — E1122 Type 2 diabetes mellitus with diabetic chronic kidney disease: Secondary | ICD-10-CM | POA: Diagnosis not present

## 2020-08-14 DIAGNOSIS — Z79899 Other long term (current) drug therapy: Secondary | ICD-10-CM | POA: Diagnosis not present

## 2020-08-14 DIAGNOSIS — I129 Hypertensive chronic kidney disease with stage 1 through stage 4 chronic kidney disease, or unspecified chronic kidney disease: Secondary | ICD-10-CM | POA: Diagnosis not present

## 2020-08-14 DIAGNOSIS — I2510 Atherosclerotic heart disease of native coronary artery without angina pectoris: Secondary | ICD-10-CM | POA: Insufficient documentation

## 2020-08-14 DIAGNOSIS — E1110 Type 2 diabetes mellitus with ketoacidosis without coma: Secondary | ICD-10-CM | POA: Insufficient documentation

## 2020-08-14 DIAGNOSIS — N1831 Chronic kidney disease, stage 3a: Secondary | ICD-10-CM | POA: Diagnosis not present

## 2020-08-14 DIAGNOSIS — E1165 Type 2 diabetes mellitus with hyperglycemia: Secondary | ICD-10-CM | POA: Insufficient documentation

## 2020-08-14 DIAGNOSIS — Z794 Long term (current) use of insulin: Secondary | ICD-10-CM | POA: Diagnosis not present

## 2020-08-14 DIAGNOSIS — F17200 Nicotine dependence, unspecified, uncomplicated: Secondary | ICD-10-CM | POA: Insufficient documentation

## 2020-08-14 LAB — BASIC METABOLIC PANEL
Anion gap: 12 (ref 5–15)
BUN: 32 mg/dL — ABNORMAL HIGH (ref 8–23)
CO2: 27 mmol/L (ref 22–32)
Calcium: 9.2 mg/dL (ref 8.9–10.3)
Chloride: 91 mmol/L — ABNORMAL LOW (ref 98–111)
Creatinine, Ser: 1.29 mg/dL — ABNORMAL HIGH (ref 0.44–1.00)
GFR, Estimated: 46 mL/min — ABNORMAL LOW (ref >60)
Glucose, Bld: 657 mg/dL (ref 70–99)
Potassium: 4.6 mmol/L (ref 3.5–5.1)
Sodium: 130 mmol/L — ABNORMAL LOW (ref 135–145)

## 2020-08-14 LAB — CBC
HCT: 31.5 % — ABNORMAL LOW (ref 36.0–46.0)
Hemoglobin: 10.7 g/dL — ABNORMAL LOW (ref 12.0–15.0)
MCH: 32.7 pg (ref 26.0–34.0)
MCHC: 34 g/dL (ref 30.0–36.0)
MCV: 96.3 fL (ref 80.0–100.0)
Platelets: 316 10*3/uL (ref 150–400)
RBC: 3.27 MIL/uL — ABNORMAL LOW (ref 3.87–5.11)
RDW: 13.2 % (ref 11.5–15.5)
WBC: 5 10*3/uL (ref 4.0–10.5)
nRBC: 0 % (ref 0.0–0.2)

## 2020-08-14 LAB — CBG MONITORING, ED
Glucose-Capillary: 567 mg/dL (ref 70–99)
Glucose-Capillary: 471 mg/dL — ABNORMAL HIGH (ref 70–99)
Glucose-Capillary: 198 mg/dL — ABNORMAL HIGH (ref 70–99)

## 2020-08-14 MED ORDER — SODIUM CHLORIDE 0.9 % IV SOLN
1000.0000 mL | Freq: Once | INTRAVENOUS | Status: AC
  Administered 2020-08-14: 1000 mL via INTRAVENOUS

## 2020-08-14 MED ORDER — INSULIN ASPART 100 UNIT/ML IJ SOLN
8.0000 [IU] | Freq: Once | INTRAMUSCULAR | Status: AC
  Administered 2020-08-14: 8 [IU] via INTRAVENOUS
  Filled 2020-08-14: qty 1

## 2020-08-14 MED ORDER — TRAMADOL HCL 50 MG PO TABS
50.0000 mg | ORAL_TABLET | Freq: Once | ORAL | Status: AC
  Administered 2020-08-14: 50 mg via ORAL
  Filled 2020-08-14: qty 1

## 2020-08-14 MED ORDER — INSULIN ASPART 100 UNIT/ML IJ SOLN
10.0000 [IU] | Freq: Once | INTRAMUSCULAR | Status: AC
  Administered 2020-08-14: 10 [IU] via SUBCUTANEOUS
  Filled 2020-08-14: qty 1

## 2020-08-14 MED ORDER — SODIUM CHLORIDE 0.9 % IV BOLUS
500.0000 mL | Freq: Once | INTRAVENOUS | Status: AC
  Administered 2020-08-14: 500 mL via INTRAVENOUS

## 2020-08-14 NOTE — ED Notes (Signed)
Unable to obtain signature at time of discharge as computer at bedside will not turn on. Pt verbalizes understanding of discharge instructions and states that she will replace her glucometer at home ASAP. Discussed diet and carb modification and pt understands this as well.

## 2020-08-14 NOTE — ED Provider Notes (Signed)
[facility] Emergency Department Provider Note   ____________________________________________    I have reviewed the triage vital signs and the nursing notes.   HISTORY  Chief Complaint Hyperglycemia     HPI Erica Mercer is a 65 y.o. female with history of bipolar disorder, diabetes, additional past medical history as detailed below who presents with complaints of hyperglycemia.  Patient reports compliance with her medication however has a history of presenting to the emergency department with elevated glucose.  Has been admitted in the past for DKA.  Overall today she feels well but is concerned because her glucose meter measured over 500.  No nausea or vomiting reported.  Past Medical History:  Diagnosis Date   Bipolar 1 disorder ([place])    CAD (coronary artery disease)    Diabetes mellitus without complication ([place])    HLD (hyperlipidemia)    HTN (hypertension)     Patient Active Problem List   Diagnosis Date Noted   Type 2 diabetes mellitus with hypoglycemia without coma, with long-term current use of insulin (HCC)    Elevated troponin    Weakness    Lactic acidosis    Hyperkalemia 07/30/2020   Hyponatremia [id number]   Acute metabolic encephalopathy [id number]   Hypertensive urgency 07/30/2020   Hematemesis of unknown etiology 07/30/2020   Meningitis 07/30/2020   Stage 3a chronic kidney disease (HCC)    CAD (coronary artery disease) 05/05/2020   Hyperosmolar hyperglycemic state (HHS) ([place]) 05/05/2020   Abdominal pain 05/05/2020   GERD (gastroesophageal reflux disease) 05/05/2020   Tobacco abuse 05/05/2020   Left arm numbness 05/05/2020   Depression 05/05/2020   Hyperglycemia    AKI (acute kidney injury) ([place]) 04/30/2020   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[place]) 04/30/2020   Sepsis ([place]) 04/30/2020   DKA, type 1 ([place]) 02/26/2020   Acute kidney injury superimposed on CKD ([place])    DKA (diabetic ketoacidosis) ([place])  02/25/2020   Malnutrition ([place]) 02/25/2020   Essential hypertension 02/25/2020   HLD (hyperlipidemia) 02/25/2020   Soft tissue mass 02/25/2020   H/O cocaine abuse ([place]) 02/17/2018   Bipolar 1 disorder ([place]) 04/22/2016   NSTEMI (non-ST elevated myocardial infarction) ([place]) 04/22/2016   COPD (chronic obstructive pulmonary disease) ([place]) 01/22/2016    Past Surgical History:  Procedure Laterality Date   COLONOSCOPY     ESOPHAGOGASTRODUODENOSCOPY      Prior to Admission medications   Medication Sig Start Date End Date Taking? Authorizing Provider  albuterol (VENTOLIN HFA) 108 (90 Base) MCG/ACT inhaler Inhale 2 puffs into the lungs every 6 (six) hours as needed for wheezing. 05/07/20 05/02/21  Loletha Grayer, MD  amLODipine (NORVASC) 10 MG tablet Take 1 tablet (10 mg total) by mouth daily. 08/03/20 09/02/20  Loletha Grayer, MD  aspirin EC 81 MG EC tablet Take 1 tablet (81 mg total) by mouth daily. Swallow whole. 08/04/20   Loletha Grayer, MD  atorvastatin (LIPITOR) 40 MG tablet Take 1 tablet (40 mg total) by mouth at bedtime. 08/03/20 09/02/20  Loletha Grayer, MD  famotidine (PEPCID) 20 MG tablet Take 1 tablet (20 mg total) by mouth at bedtime. 08/03/20   Loletha Grayer, MD  insulin glargine (LANTUS SOLOSTAR) 100 UNIT/ML Solostar Pen Inject 8 Units into the skin 2 (two) times daily. 08/03/20 09/02/20  Loletha Grayer, MD  insulin lispro (HUMALOG) 100 UNIT/ML KwikPen Inject 5 Units into the skin 3 (three) times daily before meals. 08/03/20   Loletha Grayer, MD  Insulin Pen Needle 32G X 4 MM MISC  1 Dose by Does not apply route 4 (four) times daily -  before meals and at bedtime. 08/03/20   Loletha Grayer, MD  losartan (COZAAR) 50 MG tablet Take 1 tablet (50 mg total) by mouth daily. 08/03/20 09/02/20  Loletha Grayer, MD  metoCLOPramide (REGLAN) 10 MG tablet Take 1 tablet (10 mg total) by mouth every 6 (six) hours as needed. 06/16/20   Carrie Mew, MD  metoprolol tartrate (LOPRESSOR) 25 MG  tablet Take 1 tablet (25 mg total) by mouth 2 (two) times daily. 08/03/20   Loletha Grayer, MD  mirtazapine (REMERON) 30 MG tablet Take 1 tablet (30 mg total) by mouth at bedtime. 05/07/20   Loletha Grayer, MD  pantoprazole (PROTONIX) 40 MG tablet Take 1 tablet (40 mg total) by mouth 2 (two) times daily. 08/03/20   Wieting, Richard, MD  polyethylene glycol (MIRALAX / GLYCOLAX) 17 g packet Take 34 g by mouth daily. 05/22/20   Enzo Bi, MD  sucralfate (CARAFATE) 1 GM/10ML suspension Take 10 mLs (1 g total) by mouth 4 (four) times daily. 06/03/20 06/03/21  Naaman Plummer, MD     Allergies Patient has no known allergies.  Family History  Problem Relation Age of Onset   Heart disease Mother    Prostate cancer Father     Social History Social History   Tobacco Use   Smoking status: Every Day   Smokeless tobacco: Never  Vaping Use   Vaping Use: Never used  Substance Use Topics   Alcohol use: Not Currently   Drug use: Yes    Frequency: 3.0 times per week    Types: Marijuana    Review of Systems  Constitutional: No fever/chills Eyes: No visual changes.  ENT: No sore throat. Cardiovascular: Denies chest pain. Respiratory: Denies shortness of breath. Gastrointestinal: No abdominal pain.  No nausea, no vomiting.   Genitourinary: Negative for dysuria. Musculoskeletal: Negative for back pain. Skin: Negative for rash. Neurological: Negative for headaches or weakness   ____________________________________________   PHYSICAL EXAM:  VITAL SIGNS: ED Triage Vitals  Enc Vitals Group     BP 08/14/20 1156 (!) 148/87     Pulse Rate 08/14/20 1156 85     Resp 08/14/20 1156 20     Temp 08/14/20 1156 98.4 F (36.9 C)     Temp Source 08/14/20 1156 Oral     SpO2 08/14/20 1156 97 %     Weight 08/14/20 1157 44 kg (97 lb)     Height 08/14/20 1157 1.575 m ('5\' 2"'$ )     Head Circumference --      Peak Flow --      Pain Score 08/14/20 1156 8     Pain Loc --      Pain Edu? --      Excl.  in [place]? --     Constitutional: Alert and oriented. No acute distress. Pleasant and interactive Eyes: Conjunctivae are normal.   Nose: No congestion/rhinnorhea. Mouth/Throat: Mucous membranes are dry  Cardiovascular: Normal rate, regular rhythm. Grossly normal heart sounds.  Good peripheral circulation. Respiratory: Normal respiratory effort.  No retractions. Lungs CTAB. Gastrointestinal: Soft and nontender. No distention.  No CVA tenderness.  Musculoskeletal: No lower extremity tenderness nor edema.  Warm and well perfused Neurologic:  Normal speech and language. No gross focal neurologic deficits are appreciated.  Skin:  Skin is warm, dry and intact. No rash noted. Psychiatric: Mood and affect are normal. Speech and behavior are normal.  ____________________________________________   LABS (all labs ordered are  listed, but only abnormal results are displayed)  Labs Reviewed  BASIC METABOLIC PANEL - Abnormal; Notable for the following components:      Result Value   Sodium 130 (*)    Chloride 91 (*)    Glucose, Bld 657 (*)    BUN 32 (*)    Creatinine, Ser 1.29 (*)    GFR, Estimated 46 (*)    All other components within normal limits  CBC - Abnormal; Notable for the following components:   RBC 3.27 (*)    Hemoglobin 10.7 (*)    HCT 31.5 (*)    All other components within normal limits  CBG MONITORING, ED - Abnormal; Notable for the following components:   Glucose-Capillary 567 (*)    All other components within normal limits  CBG MONITORING, ED - Abnormal; Notable for the following components:   Glucose-Capillary 471 (*)    All other components within normal limits  CBG MONITORING, ED - Abnormal; Notable for the following components:   Glucose-Capillary 198 (*)    All other components within normal limits  URINALYSIS, COMPLETE (UACMP) WITH MICROSCOPIC    ____________________________________________  EKG  None ____________________________________________  RADIOLOGY  None ____________________________________________   PROCEDURES  Procedure(s) performed: No  Procedures   Critical Care performed: No ____________________________________________   INITIAL IMPRESSION / ASSESSMENT AND PLAN / ED COURSE  Pertinent labs & imaging results that were available during my care of the patient were reviewed by me and considered in my medical decision making (see chart for details).   Patient presents with elevated glucose.  BMP demonstrates a glucose of 657 with a normal anion gap.  Will treat with 10 units subcu insulin, IV fluids and reevaluate and monitor glucose carefully.   After subcutaneous insulin and fluids patient's glucose has improved but still remains high, 8 units IV insulin given  Glucose improved to sub 200, appropriate for discharge outpatient follow-up   ____________________________________________   FINAL CLINICAL IMPRESSION(S) / ED DIAGNOSES  Final diagnoses:  Hyperglycemia        Note:  This document was prepared using Dragon voice recognition software and may include unintentional dictation errors.    Lavonia Drafts, MD 08/14/20 [phone]

## 2020-08-14 NOTE — ED Triage Notes (Signed)
Pt to ED [facility] for hyperglycemia. Glucometer reads 567. Pt reports fatigue and nausea Pt in NAD Alert and oriented

## 2020-08-14 NOTE — ED Notes (Signed)
Glucometer did not cross over . CBG 567

## 2020-08-19 ENCOUNTER — Inpatient Hospital Stay
Admission: EM | Admit: 2020-08-19 | Discharge: 2020-08-23 | DRG: 637 | Disposition: A | Discharge status: Home Health | Payer: Medicare Other | Attending: Internal Medicine | Admitting: Internal Medicine

## 2020-08-19 ENCOUNTER — Other Ambulatory Visit: Payer: Self-pay

## 2020-08-19 ENCOUNTER — Encounter: Payer: Self-pay | Admitting: *Deleted

## 2020-08-19 DIAGNOSIS — J449 Chronic obstructive pulmonary disease, unspecified: Secondary | ICD-10-CM | POA: Diagnosis present

## 2020-08-19 DIAGNOSIS — E1110 Type 2 diabetes mellitus with ketoacidosis without coma: Secondary | ICD-10-CM | POA: Diagnosis not present

## 2020-08-19 DIAGNOSIS — R778 Other specified abnormalities of plasma proteins: Secondary | ICD-10-CM | POA: Diagnosis present

## 2020-08-19 DIAGNOSIS — F319 Bipolar disorder, unspecified: Secondary | ICD-10-CM | POA: Diagnosis present

## 2020-08-19 DIAGNOSIS — E43 Unspecified severe protein-calorie malnutrition: Secondary | ICD-10-CM | POA: Diagnosis present

## 2020-08-19 DIAGNOSIS — Z7982 Long term (current) use of aspirin: Secondary | ICD-10-CM

## 2020-08-19 DIAGNOSIS — K5900 Constipation, unspecified: Secondary | ICD-10-CM | POA: Diagnosis present

## 2020-08-19 DIAGNOSIS — E1122 Type 2 diabetes mellitus with diabetic chronic kidney disease: Secondary | ICD-10-CM | POA: Diagnosis present

## 2020-08-19 DIAGNOSIS — D259 Leiomyoma of uterus, unspecified: Secondary | ICD-10-CM | POA: Diagnosis present

## 2020-08-19 DIAGNOSIS — Z681 Body mass index (BMI) 19 or less, adult: Secondary | ICD-10-CM

## 2020-08-19 DIAGNOSIS — K219 Gastro-esophageal reflux disease without esophagitis: Secondary | ICD-10-CM | POA: Diagnosis present

## 2020-08-19 DIAGNOSIS — N898 Other specified noninflammatory disorders of vagina: Secondary | ICD-10-CM | POA: Diagnosis present

## 2020-08-19 DIAGNOSIS — N179 Acute kidney failure, unspecified: Secondary | ICD-10-CM | POA: Diagnosis present

## 2020-08-19 DIAGNOSIS — R112 Nausea with vomiting, unspecified: Secondary | ICD-10-CM | POA: Diagnosis present

## 2020-08-19 DIAGNOSIS — F32A Depression, unspecified: Secondary | ICD-10-CM | POA: Diagnosis present

## 2020-08-19 DIAGNOSIS — E1165 Type 2 diabetes mellitus with hyperglycemia: Principal | ICD-10-CM

## 2020-08-19 DIAGNOSIS — I10 Essential (primary) hypertension: Secondary | ICD-10-CM | POA: Diagnosis present

## 2020-08-19 DIAGNOSIS — I2510 Atherosclerotic heart disease of native coronary artery without angina pectoris: Secondary | ICD-10-CM | POA: Diagnosis present

## 2020-08-19 DIAGNOSIS — N1831 Chronic kidney disease, stage 3a: Secondary | ICD-10-CM

## 2020-08-19 DIAGNOSIS — I129 Hypertensive chronic kidney disease with stage 1 through stage 4 chronic kidney disease, or unspecified chronic kidney disease: Secondary | ICD-10-CM | POA: Diagnosis present

## 2020-08-19 DIAGNOSIS — E785 Hyperlipidemia, unspecified: Secondary | ICD-10-CM | POA: Diagnosis present

## 2020-08-19 DIAGNOSIS — E1100 Type 2 diabetes mellitus with hyperosmolarity without nonketotic hyperglycemic-hyperosmolar coma (NKHHC): Secondary | ICD-10-CM

## 2020-08-19 DIAGNOSIS — Z79899 Other long term (current) drug therapy: Secondary | ICD-10-CM

## 2020-08-19 DIAGNOSIS — Z20822 Contact with and (suspected) exposure to covid-19: Secondary | ICD-10-CM | POA: Diagnosis present

## 2020-08-19 DIAGNOSIS — Z8249 Family history of ischemic heart disease and other diseases of the circulatory system: Secondary | ICD-10-CM

## 2020-08-19 DIAGNOSIS — I700 Atherosclerosis of aorta: Secondary | ICD-10-CM | POA: Diagnosis present

## 2020-08-19 DIAGNOSIS — E440 Moderate protein-calorie malnutrition: Secondary | ICD-10-CM | POA: Diagnosis present

## 2020-08-19 DIAGNOSIS — F17200 Nicotine dependence, unspecified, uncomplicated: Secondary | ICD-10-CM | POA: Diagnosis present

## 2020-08-19 DIAGNOSIS — E11649 Type 2 diabetes mellitus with hypoglycemia without coma: Secondary | ICD-10-CM | POA: Diagnosis present

## 2020-08-19 DIAGNOSIS — E1129 Type 2 diabetes mellitus with other diabetic kidney complication: Secondary | ICD-10-CM | POA: Diagnosis present

## 2020-08-19 DIAGNOSIS — Z794 Long term (current) use of insulin: Secondary | ICD-10-CM

## 2020-08-19 DIAGNOSIS — Z720 Tobacco use: Secondary | ICD-10-CM | POA: Diagnosis present

## 2020-08-19 DIAGNOSIS — N183 Chronic kidney disease, stage 3 unspecified: Secondary | ICD-10-CM | POA: Diagnosis present

## 2020-08-19 DIAGNOSIS — R197 Diarrhea, unspecified: Secondary | ICD-10-CM | POA: Diagnosis present

## 2020-08-19 DIAGNOSIS — R109 Unspecified abdominal pain: Secondary | ICD-10-CM | POA: Diagnosis present

## 2020-08-19 NOTE — ED Triage Notes (Signed)
Pt brought in via ems from home with abd pain, n/v and high blood sugar.  Iv in place ems gave zofran and fentanyl.  Pt alert

## 2020-08-20 ENCOUNTER — Emergency Department: Payer: Medicare Other

## 2020-08-20 DIAGNOSIS — Z8249 Family history of ischemic heart disease and other diseases of the circulatory system: Secondary | ICD-10-CM | POA: Diagnosis not present

## 2020-08-20 DIAGNOSIS — N1831 Chronic kidney disease, stage 3a: Secondary | ICD-10-CM | POA: Diagnosis present

## 2020-08-20 DIAGNOSIS — N1832 Chronic kidney disease, stage 3b: Secondary | ICD-10-CM | POA: Diagnosis not present

## 2020-08-20 DIAGNOSIS — Z794 Long term (current) use of insulin: Secondary | ICD-10-CM | POA: Diagnosis not present

## 2020-08-20 DIAGNOSIS — J449 Chronic obstructive pulmonary disease, unspecified: Secondary | ICD-10-CM

## 2020-08-20 DIAGNOSIS — F17200 Nicotine dependence, unspecified, uncomplicated: Secondary | ICD-10-CM | POA: Diagnosis present

## 2020-08-20 DIAGNOSIS — I2510 Atherosclerotic heart disease of native coronary artery without angina pectoris: Secondary | ICD-10-CM | POA: Diagnosis present

## 2020-08-20 DIAGNOSIS — N898 Other specified noninflammatory disorders of vagina: Secondary | ICD-10-CM | POA: Diagnosis present

## 2020-08-20 DIAGNOSIS — N179 Acute kidney failure, unspecified: Secondary | ICD-10-CM | POA: Diagnosis present

## 2020-08-20 DIAGNOSIS — I129 Hypertensive chronic kidney disease with stage 1 through stage 4 chronic kidney disease, or unspecified chronic kidney disease: Secondary | ICD-10-CM | POA: Diagnosis present

## 2020-08-20 DIAGNOSIS — R112 Nausea with vomiting, unspecified: Secondary | ICD-10-CM | POA: Diagnosis present

## 2020-08-20 DIAGNOSIS — Z7982 Long term (current) use of aspirin: Secondary | ICD-10-CM | POA: Diagnosis not present

## 2020-08-20 DIAGNOSIS — I700 Atherosclerosis of aorta: Secondary | ICD-10-CM | POA: Diagnosis present

## 2020-08-20 DIAGNOSIS — R778 Other specified abnormalities of plasma proteins: Secondary | ICD-10-CM | POA: Diagnosis present

## 2020-08-20 DIAGNOSIS — K219 Gastro-esophageal reflux disease without esophagitis: Secondary | ICD-10-CM | POA: Diagnosis present

## 2020-08-20 DIAGNOSIS — Z681 Body mass index (BMI) 19 or less, adult: Secondary | ICD-10-CM | POA: Diagnosis not present

## 2020-08-20 DIAGNOSIS — I10 Essential (primary) hypertension: Secondary | ICD-10-CM

## 2020-08-20 DIAGNOSIS — E440 Moderate protein-calorie malnutrition: Secondary | ICD-10-CM | POA: Diagnosis present

## 2020-08-20 DIAGNOSIS — E1122 Type 2 diabetes mellitus with diabetic chronic kidney disease: Secondary | ICD-10-CM | POA: Diagnosis present

## 2020-08-20 DIAGNOSIS — R197 Diarrhea, unspecified: Secondary | ICD-10-CM

## 2020-08-20 DIAGNOSIS — R109 Unspecified abdominal pain: Secondary | ICD-10-CM

## 2020-08-20 DIAGNOSIS — D259 Leiomyoma of uterus, unspecified: Secondary | ICD-10-CM | POA: Diagnosis present

## 2020-08-20 DIAGNOSIS — Z20822 Contact with and (suspected) exposure to covid-19: Secondary | ICD-10-CM | POA: Diagnosis present

## 2020-08-20 DIAGNOSIS — Z79899 Other long term (current) drug therapy: Secondary | ICD-10-CM | POA: Diagnosis not present

## 2020-08-20 DIAGNOSIS — E1010 Type 1 diabetes mellitus with ketoacidosis without coma: Secondary | ICD-10-CM

## 2020-08-20 DIAGNOSIS — N178 Other acute kidney failure: Secondary | ICD-10-CM | POA: Diagnosis not present

## 2020-08-20 DIAGNOSIS — E43 Unspecified severe protein-calorie malnutrition: Secondary | ICD-10-CM | POA: Diagnosis present

## 2020-08-20 DIAGNOSIS — E1110 Type 2 diabetes mellitus with ketoacidosis without coma: Secondary | ICD-10-CM | POA: Diagnosis present

## 2020-08-20 DIAGNOSIS — E11649 Type 2 diabetes mellitus with hypoglycemia without coma: Secondary | ICD-10-CM | POA: Diagnosis present

## 2020-08-20 DIAGNOSIS — F319 Bipolar disorder, unspecified: Secondary | ICD-10-CM | POA: Diagnosis present

## 2020-08-20 DIAGNOSIS — E785 Hyperlipidemia, unspecified: Secondary | ICD-10-CM | POA: Diagnosis present

## 2020-08-20 DIAGNOSIS — N183 Chronic kidney disease, stage 3 unspecified: Secondary | ICD-10-CM | POA: Diagnosis present

## 2020-08-20 DIAGNOSIS — K5900 Constipation, unspecified: Secondary | ICD-10-CM | POA: Diagnosis present

## 2020-08-20 DIAGNOSIS — E1129 Type 2 diabetes mellitus with other diabetic kidney complication: Secondary | ICD-10-CM | POA: Diagnosis present

## 2020-08-20 LAB — URINALYSIS, COMPLETE (UACMP) WITH MICROSCOPIC
Bacteria, UA: NONE SEEN
Bilirubin Urine: NEGATIVE
Glucose, UA: >=500 mg/dL — AB
Hgb urine dipstick: NEGATIVE
Ketones, ur: 20 mg/dL — AB
Leukocytes,Ua: NEGATIVE
Nitrite: NEGATIVE
Protein, ur: 100 mg/dL — AB
Specific Gravity, Urine: 1.021 (ref 1.005–1.030)
pH: 6 (ref 5.0–8.0)

## 2020-08-20 LAB — BASIC METABOLIC PANEL
Anion gap: 10 (ref 5–15)
Anion gap: 10 (ref 5–15)
BUN: 42 mg/dL — ABNORMAL HIGH (ref 8–23)
BUN: 39 mg/dL — ABNORMAL HIGH (ref 8–23)
CO2: 29 mmol/L (ref 22–32)
CO2: 31 mmol/L (ref 22–32)
Calcium: 9.6 mg/dL (ref 8.9–10.3)
Calcium: 9.5 mg/dL (ref 8.9–10.3)
Chloride: 95 mmol/L — ABNORMAL LOW (ref 98–111)
Chloride: 96 mmol/L — ABNORMAL LOW (ref 98–111)
Creatinine, Ser: 1.38 mg/dL — ABNORMAL HIGH (ref 0.44–1.00)
Creatinine, Ser: 1.19 mg/dL — ABNORMAL HIGH (ref 0.44–1.00)
GFR, Estimated: 42 mL/min — ABNORMAL LOW (ref >60)
GFR, Estimated: 51 mL/min — ABNORMAL LOW (ref >60)
Glucose, Bld: 465 mg/dL — ABNORMAL HIGH (ref 70–99)
Glucose, Bld: 143 mg/dL — ABNORMAL HIGH (ref 70–99)
Potassium: 4.3 mmol/L (ref 3.5–5.1)
Potassium: 4.2 mmol/L (ref 3.5–5.1)
Sodium: 134 mmol/L — ABNORMAL LOW (ref 135–145)
Sodium: 137 mmol/L (ref 135–145)

## 2020-08-20 LAB — COMPREHENSIVE METABOLIC PANEL
ALT: 38 U/L (ref 0–44)
AST: 36 U/L (ref 15–41)
Albumin: 3.7 g/dL (ref 3.5–5.0)
Alkaline Phosphatase: 176 U/L — ABNORMAL HIGH (ref 38–126)
Anion gap: 17 — ABNORMAL HIGH (ref 5–15)
BUN: 45 mg/dL — ABNORMAL HIGH (ref 8–23)
CO2: 27 mmol/L (ref 22–32)
Calcium: 9.5 mg/dL (ref 8.9–10.3)
Chloride: 84 mmol/L — ABNORMAL LOW (ref 98–111)
Creatinine, Ser: 1.65 mg/dL — ABNORMAL HIGH (ref 0.44–1.00)
GFR, Estimated: 34 mL/min — ABNORMAL LOW (ref >60)
Glucose, Bld: 934 mg/dL (ref 70–99)
Potassium: 4.7 mmol/L (ref 3.5–5.1)
Sodium: 128 mmol/L — ABNORMAL LOW (ref 135–145)
Total Bilirubin: 1.4 mg/dL — ABNORMAL HIGH (ref 0.3–1.2)
Total Protein: 6.7 g/dL (ref 6.5–8.1)

## 2020-08-20 LAB — OSMOLALITY: Osmolality: 329 mOsm/kg (ref 275–295)

## 2020-08-20 LAB — CBG MONITORING, ED
Glucose-Capillary: 301 mg/dL — ABNORMAL HIGH (ref 70–99)
Glucose-Capillary: 194 mg/dL — ABNORMAL HIGH (ref 70–99)
Glucose-Capillary: 132 mg/dL — ABNORMAL HIGH (ref 70–99)
Glucose-Capillary: 93 mg/dL (ref 70–99)
Glucose-Capillary: 143 mg/dL — ABNORMAL HIGH (ref 70–99)

## 2020-08-20 LAB — BLOOD GAS, VENOUS
Acid-Base Excess: 5.2 mmol/L — ABNORMAL HIGH (ref 0.0–2.0)
Bicarbonate: 31.8 mmol/L — ABNORMAL HIGH (ref 20.0–28.0)
O2 Saturation: 68.6 %
Patient temperature: 37
pCO2, Ven: 55 mmHg (ref 44.0–60.0)
pH, Ven: 7.37 (ref 7.250–7.430)
pO2, Ven: 37 mmHg (ref 32.0–45.0)

## 2020-08-20 LAB — URINE DRUG SCREEN, QUALITATIVE (ARMC ONLY)
Amphetamines, Ur Screen: NOT DETECTED
Barbiturates, Ur Screen: NOT DETECTED
Benzodiazepine, Ur Scrn: NOT DETECTED
Cannabinoid 50 Ng, Ur ~~LOC~~: POSITIVE — AB
Cocaine Metabolite,Ur ~~LOC~~: NOT DETECTED
MDMA (Ecstasy)Ur Screen: NOT DETECTED
Methadone Scn, Ur: NOT DETECTED
Opiate, Ur Screen: NOT DETECTED
Phencyclidine (PCP) Ur S: NOT DETECTED
Tricyclic, Ur Screen: NOT DETECTED

## 2020-08-20 LAB — BETA-HYDROXYBUTYRIC ACID: Beta-Hydroxybutyric Acid: 5.44 mmol/L — ABNORMAL HIGH (ref 0.05–0.27)

## 2020-08-20 LAB — CBC WITH DIFFERENTIAL/PLATELET
Abs Immature Granulocytes: 0.02 10*3/uL (ref 0.00–0.07)
Basophils Absolute: 0 10*3/uL (ref 0.0–0.1)
Basophils Relative: 1 %
Eosinophils Absolute: 0 10*3/uL (ref 0.0–0.5)
Eosinophils Relative: 0 %
HCT: 35.7 % — ABNORMAL LOW (ref 36.0–46.0)
Hemoglobin: 12.4 g/dL (ref 12.0–15.0)
Immature Granulocytes: 0 %
Lymphocytes Relative: 27 %
Lymphs Abs: 1.3 10*3/uL (ref 0.7–4.0)
MCH: 33.9 pg (ref 26.0–34.0)
MCHC: 34.7 g/dL (ref 30.0–36.0)
MCV: 97.5 fL (ref 80.0–100.0)
Monocytes Absolute: 0.2 10*3/uL (ref 0.1–1.0)
Monocytes Relative: 4 %
Neutro Abs: 3.2 10*3/uL (ref 1.7–7.7)
Neutrophils Relative %: 68 %
Platelets: 326 10*3/uL (ref 150–400)
RBC: 3.66 MIL/uL — ABNORMAL LOW (ref 3.87–5.11)
RDW: 13.6 % (ref 11.5–15.5)
WBC: 4.8 10*3/uL (ref 4.0–10.5)
nRBC: 0 % (ref 0.0–0.2)

## 2020-08-20 LAB — LACTIC ACID, PLASMA
Lactic Acid, Venous: 1.4 mmol/L (ref 0.5–1.9)
Lactic Acid, Venous: 1.5 mmol/L (ref 0.5–1.9)

## 2020-08-20 LAB — TROPONIN I (HIGH SENSITIVITY)
Troponin I (High Sensitivity): 23 ng/L — ABNORMAL HIGH (ref <18)
Troponin I (High Sensitivity): 27 ng/L — ABNORMAL HIGH (ref <18)
Troponin I (High Sensitivity): 28 ng/L — ABNORMAL HIGH (ref <18)
Troponin I (High Sensitivity): 29 ng/L — ABNORMAL HIGH (ref <18)
Troponin I (High Sensitivity): 32 ng/L — ABNORMAL HIGH (ref <18)

## 2020-08-20 LAB — GLUCOSE, CAPILLARY
Glucose-Capillary: 278 mg/dL — ABNORMAL HIGH (ref 70–99)
Glucose-Capillary: 242 mg/dL — ABNORMAL HIGH (ref 70–99)

## 2020-08-20 LAB — RESP PANEL BY RT-PCR (FLU A&B, COVID) ARPGX2
Influenza A by PCR: NEGATIVE
Influenza B by PCR: NEGATIVE
SARS Coronavirus 2 by RT PCR: NEGATIVE

## 2020-08-20 LAB — MAGNESIUM: Magnesium: 2.2 mg/dL (ref 1.7–2.4)

## 2020-08-20 LAB — LIPASE, BLOOD: Lipase: 30 U/L (ref 11–51)

## 2020-08-20 LAB — PROCALCITONIN: Procalcitonin: 0.15 ng/mL

## 2020-08-20 MED ORDER — DEXTROSE IN LACTATED RINGERS 5 % IV SOLN: INTRAVENOUS | Status: DC

## 2020-08-20 MED ORDER — AMLODIPINE BESYLATE 10 MG PO TABS
10.0000 mg | ORAL_TABLET | Freq: Every day | ORAL | Status: DC
  Administered 2020-08-20 – 2020-08-23 (×4): 10 mg via ORAL
  Filled 2020-08-20: qty 1
  Filled 2020-08-20: qty 2
  Filled 2020-08-20 (×2): qty 1

## 2020-08-20 MED ORDER — INSULIN GLARGINE-YFGN 100 UNIT/ML ~~LOC~~ SOLN
8.0000 [IU] | Freq: Two times a day (BID) | SUBCUTANEOUS | Status: DC
  Administered 2020-08-20 – 2020-08-21 (×3): 8 [IU] via SUBCUTANEOUS
  Filled 2020-08-20 (×4): qty 0.08

## 2020-08-20 MED ORDER — MORPHINE SULFATE (PF) 2 MG/ML IV SOLN
2.0000 mg | INTRAVENOUS | Status: DC | PRN
Start: 2020-08-20 — End: 2020-08-20
  Administered 2020-08-20: 2 mg via INTRAVENOUS
  Filled 2020-08-20: qty 1

## 2020-08-20 MED ORDER — INSULIN REGULAR(HUMAN) IN NACL 100-0.9 UT/100ML-% IV SOLN: INTRAVENOUS | Status: DC

## 2020-08-20 MED ORDER — DEXTROSE 50 % IV SOLN: 0.0000 mL | INTRAVENOUS | Status: DC | PRN

## 2020-08-20 MED ORDER — LACTATED RINGERS IV SOLN: INTRAVENOUS | Status: DC

## 2020-08-20 MED ORDER — ASPIRIN EC 81 MG PO TBEC
81.0000 mg | DELAYED_RELEASE_TABLET | Freq: Every day | ORAL | Status: DC
  Administered 2020-08-20 – 2020-08-23 (×4): 81 mg via ORAL
  Filled 2020-08-20 (×4): qty 1

## 2020-08-20 MED ORDER — LOSARTAN POTASSIUM 50 MG PO TABS
50.0000 mg | ORAL_TABLET | Freq: Every day | ORAL | Status: DC
  Administered 2020-08-20 – 2020-08-22 (×3): 50 mg via ORAL
  Filled 2020-08-20 (×3): qty 1

## 2020-08-20 MED ORDER — DM-GUAIFENESIN ER 30-600 MG PO TB12: 1.0000 | ORAL_TABLET | Freq: Two times a day (BID) | ORAL | Status: DC | PRN

## 2020-08-20 MED ORDER — ONDANSETRON HCL 4 MG/2ML IJ SOLN: 4.0000 mg | Freq: Three times a day (TID) | INTRAMUSCULAR | Status: DC | PRN

## 2020-08-20 MED ORDER — KETOROLAC TROMETHAMINE 30 MG/ML IJ SOLN
15.0000 mg | Freq: Once | INTRAMUSCULAR | Status: AC
  Administered 2020-08-20: 15 mg via INTRAVENOUS
  Filled 2020-08-20: qty 1

## 2020-08-20 MED ORDER — SUCRALFATE 1 GM/10ML PO SUSP
1.0000 g | Freq: Four times a day (QID) | ORAL | Status: DC | PRN
  Filled 2020-08-20: qty 10

## 2020-08-20 MED ORDER — ATORVASTATIN CALCIUM 20 MG PO TABS
40.0000 mg | ORAL_TABLET | Freq: Every day | ORAL | Status: DC
  Administered 2020-08-20 – 2020-08-22 (×3): 40 mg via ORAL
  Filled 2020-08-20 (×3): qty 2

## 2020-08-20 MED ORDER — HYDRALAZINE HCL 20 MG/ML IJ SOLN
5.0000 mg | INTRAMUSCULAR | Status: DC | PRN
  Administered 2020-08-21 – 2020-08-22 (×2): 5 mg via INTRAVENOUS
  Filled 2020-08-20 (×2): qty 1

## 2020-08-20 MED ORDER — ACETAMINOPHEN 325 MG PO TABS
650.0000 mg | ORAL_TABLET | Freq: Four times a day (QID) | ORAL | Status: DC | PRN
Start: 2020-08-20 — End: 2020-08-23

## 2020-08-20 MED ORDER — INSULIN ASPART 100 UNIT/ML IJ SOLN
0.0000 [IU] | INTRAMUSCULAR | Status: DC
  Administered 2020-08-20: 17:00:00 3 [IU] via SUBCUTANEOUS
  Administered 2020-08-20: 22:00:00 2 [IU] via SUBCUTANEOUS
  Administered 2020-08-21: 02:00:00 4 [IU] via SUBCUTANEOUS
  Administered 2020-08-21: 08:00:00 1 [IU] via SUBCUTANEOUS
  Administered 2020-08-21: 05:00:00 2 [IU] via SUBCUTANEOUS
  Filled 2020-08-20 (×5): qty 1

## 2020-08-20 MED ORDER — ALBUTEROL SULFATE (2.5 MG/3ML) 0.083% IN NEBU: 2.5000 mg | INHALATION_SOLUTION | RESPIRATORY_TRACT | Status: DC | PRN

## 2020-08-20 MED ORDER — METOPROLOL TARTRATE 25 MG PO TABS
25.0000 mg | ORAL_TABLET | Freq: Two times a day (BID) | ORAL | Status: DC
  Administered 2020-08-20 – 2020-08-22 (×6): 25 mg via ORAL
  Filled 2020-08-20 (×6): qty 1

## 2020-08-20 MED ORDER — ENOXAPARIN SODIUM 30 MG/0.3ML IJ SOSY
30.0000 mg | PREFILLED_SYRINGE | INTRAMUSCULAR | Status: DC
  Administered 2020-08-20 – 2020-08-22 (×3): 30 mg via SUBCUTANEOUS
  Filled 2020-08-20 (×3): qty 0.3

## 2020-08-20 MED ORDER — MIRTAZAPINE 15 MG PO TABS
15.0000 mg | ORAL_TABLET | Freq: Every day | ORAL | Status: DC
  Administered 2020-08-20 – 2020-08-22 (×3): 15 mg via ORAL
  Filled 2020-08-20 (×3): qty 1

## 2020-08-20 MED ORDER — NICOTINE 21 MG/24HR TD PT24
21.0000 mg | MEDICATED_PATCH | Freq: Every day | TRANSDERMAL | Status: DC
  Administered 2020-08-20 – 2020-08-22 (×3): 21 mg via TRANSDERMAL
  Filled 2020-08-20 (×4): qty 1

## 2020-08-20 MED ORDER — LACTATED RINGERS IV BOLUS
1000.0000 mL | Freq: Once | INTRAVENOUS | Status: AC
  Administered 2020-08-20: 1000 mL via INTRAVENOUS

## 2020-08-20 MED ORDER — FENTANYL CITRATE (PF) 100 MCG/2ML IJ SOLN
50.0000 ug | Freq: Once | INTRAMUSCULAR | Status: AC
  Administered 2020-08-20: 50 ug via INTRAVENOUS
  Filled 2020-08-20: qty 2

## 2020-08-20 MED ORDER — IOHEXOL 300 MG/ML  SOLN
75.0000 mL | Freq: Once | INTRAMUSCULAR | Status: AC | PRN
  Administered 2020-08-20: 75 mL via INTRAVENOUS

## 2020-08-20 MED ORDER — KETOROLAC TROMETHAMINE 30 MG/ML IJ SOLN
15.0000 mg | Freq: Three times a day (TID) | INTRAMUSCULAR | Status: DC | PRN
  Administered 2020-08-20 – 2020-08-23 (×8): 15 mg via INTRAVENOUS
  Filled 2020-08-20 (×8): qty 1

## 2020-08-20 MED ORDER — INSULIN REGULAR(HUMAN) IN NACL 100-0.9 UT/100ML-% IV SOLN
INTRAVENOUS | Status: DC
  Administered 2020-08-20: 6.5 [IU]/h via INTRAVENOUS
  Filled 2020-08-20: qty 100

## 2020-08-20 MED ORDER — PANTOPRAZOLE SODIUM 40 MG PO TBEC
40.0000 mg | DELAYED_RELEASE_TABLET | Freq: Two times a day (BID) | ORAL | Status: DC
  Administered 2020-08-20 – 2020-08-23 (×7): 40 mg via ORAL
  Filled 2020-08-20 (×7): qty 1

## 2020-08-20 MED ORDER — POTASSIUM CHLORIDE 10 MEQ/100ML IV SOLN
10.0000 meq | INTRAVENOUS | Status: AC
  Administered 2020-08-20 (×2): 10 meq via INTRAVENOUS
  Filled 2020-08-20 (×2): qty 100

## 2020-08-20 MED ORDER — FAMOTIDINE 20 MG PO TABS
20.0000 mg | ORAL_TABLET | Freq: Every day | ORAL | Status: DC
  Administered 2020-08-20 – 2020-08-22 (×3): 20 mg via ORAL
  Filled 2020-08-20 (×3): qty 1

## 2020-08-20 MED ORDER — ENOXAPARIN SODIUM 40 MG/0.4ML IJ SOSY: 40.0000 mg | PREFILLED_SYRINGE | INTRAMUSCULAR | Status: DC

## 2020-08-20 NOTE — Progress Notes (Signed)
Inpatient Diabetes Program Recommendations  AACE/ADA: New Consensus Statement on Inpatient Glycemic Control (2015)  Target Ranges:  Prepandial:   less than 140 mg/dL      Peak postprandial:   less than 180 mg/dL (1-2 hours)      Critically ill patients:  140 - 180 mg/dL   Lab Results  Component Value Date   GLUCAP 132 (H) 08/20/2020   HGBA1C 14.8 (H) 07/30/2020    Review of Glycemic Control Results for Erica Mercer, Erica Mercer (MRN [id number]) as of 08/20/2020 10:23  Ref. Range 08/20/2020 07:24 08/20/2020 08:29 08/20/2020 09:34  Glucose-Capillary Latest Ref Range: 70 - 99 mg/dL 301 (H) 194 (H) 132 (H)  Results for Erica Mercer, Erica Mercer (MRN [id number]) as of 08/20/2020 10:23  Ref. Range 08/20/2020 07:53  Sodium Latest Ref Range: 135 - 145 mmol/L 137  Potassium Latest Ref Range: 3.5 - 5.1 mmol/L 4.2  Chloride Latest Ref Range: 98 - 111 mmol/L 96 (L)  CO2 Latest Ref Range: 22 - 32 mmol/L 31  Glucose Latest Ref Range: 70 - 99 mg/dL 143 (H)  BUN Latest Ref Range: 8 - 23 mg/dL 39 (H)  Creatinine Latest Ref Range: 0.44 - 1.00 mg/dL 1.19 (H)  Calcium Latest Ref Range: 8.9 - 10.3 mg/dL 9.5  Anion gap Latest Ref Range: 5 - 15  10  GFR, Estimated Latest Ref Range: >60 mL/min 51 (L)   Diabetes history: DM 2 Outpatient Diabetes medications:  Lantus 8 units bid, Humalog 5 units tid with meals Current orders for Inpatient glycemic control:  IV insulin- Inpatient Diabetes Program Recommendations:    A1C indicates very poor control of DM management. Consider restarting basal insulin glargine-yfgn (Semglee) 8 units bid 2 hours prior to d/c of insulin drip. Once insulin drip stopped, consider adding Novolog very sensitive (0-6 units) q 4 hours.   Thanks,  Adah Perl, RN, BC-ADM Inpatient Diabetes Coordinator Pager [phone] (8a-5p)

## 2020-08-20 NOTE — ED Provider Notes (Signed)
[facility] Emergency Department Provider Note  ____________________________________________   Event Date/Time   First MD Initiated Contact with Patient 08/19/20 2359     (approximate)  I have reviewed the triage vital signs and the nursing notes.   HISTORY  Chief Complaint Abdominal Pain   HPI Erica Mercer is a 65 y.o. female with a past medical history of HTN, HDL, DM, CAD and bipolar disorder as well as CKD, tobacco abuse and COPD who presents for assessment of approximately 1 day of some generalized crampy abdominal pain associate with some nonbloody nonbilious vomiting and some nonbloody diarrhea.  Patient states her cough is increased in the last day from baseline.  She denies any chest pain, shortness of breath, headache, earache, sore throat, urinary symptoms rash or extremity pain.  She has had a little bit of vaginal itchiness but no pain.  No recent injuries or falls.  She states she is been taking all her medicines as directed.  States has been cutting down on her smoking.  She has been using her inhalers at home but has not noticed much change in her cough.         Past Medical History:  Diagnosis Date   Bipolar 1 disorder ([place])    CAD (coronary artery disease)    Diabetes mellitus without complication ([place])    HLD (hyperlipidemia)    HTN (hypertension)     Patient Active Problem List   Diagnosis Date Noted   Type 2 diabetes mellitus with hypoglycemia without coma, with long-term current use of insulin (HCC)    Elevated troponin    Weakness    Lactic acidosis    Hyperkalemia 07/30/2020   Hyponatremia 65/99/3570   Acute metabolic encephalopathy 17/79/3903   Hypertensive urgency 07/30/2020   Hematemesis of unknown etiology 07/30/2020   Meningitis 07/30/2020   Stage 3a chronic kidney disease (HCC)    CAD (coronary artery disease) 05/05/2020   Hyperosmolar hyperglycemic state (HHS) ([place]) 05/05/2020   Abdominal pain 05/05/2020    GERD (gastroesophageal reflux disease) 05/05/2020   Tobacco abuse 05/05/2020   Left arm numbness 05/05/2020   Depression 05/05/2020   Hyperglycemia    AKI (acute kidney injury) ([place]) 04/30/2020   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[place]) 04/30/2020   Sepsis ([place]) 04/30/2020   DKA, type 1 ([place]) 02/26/2020   Acute kidney injury superimposed on CKD ([place])    DKA (diabetic ketoacidosis) ([place]) 02/25/2020   Malnutrition ([place]) 02/25/2020   Essential hypertension 02/25/2020   HLD (hyperlipidemia) 02/25/2020   Soft tissue mass 02/25/2020   H/O cocaine abuse ([place]) 02/17/2018   Bipolar 1 disorder ([place]) 04/22/2016   NSTEMI (non-ST elevated myocardial infarction) ([place]) 04/22/2016   COPD (chronic obstructive pulmonary disease) ([place]) 01/22/2016    Past Surgical History:  Procedure Laterality Date   COLONOSCOPY     ESOPHAGOGASTRODUODENOSCOPY      Prior to Admission medications   Medication Sig Start Date End Date Taking? Authorizing Provider  albuterol (VENTOLIN HFA) 108 (90 Base) MCG/ACT inhaler Inhale 2 puffs into the lungs every 6 (six) hours as needed for wheezing. 05/07/20 05/02/21  Loletha Grayer, MD  amLODipine (NORVASC) 10 MG tablet Take 1 tablet (10 mg total) by mouth daily. 08/03/20 09/02/20  Loletha Grayer, MD  aspirin EC 81 MG EC tablet Take 1 tablet (81 mg total) by mouth daily. Swallow whole. 08/04/20   Loletha Grayer, MD  atorvastatin (LIPITOR) 40 MG tablet Take 1 tablet (40 mg total) by mouth at bedtime. 08/03/20  09/02/20  Loletha Grayer, MD  famotidine (PEPCID) 20 MG tablet Take 1 tablet (20 mg total) by mouth at bedtime. 08/03/20   Loletha Grayer, MD  insulin glargine (LANTUS SOLOSTAR) 100 UNIT/ML Solostar Pen Inject 8 Units into the skin 2 (two) times daily. 08/03/20 09/02/20  Loletha Grayer, MD  insulin lispro (HUMALOG) 100 UNIT/ML KwikPen Inject 5 Units into the skin 3 (three) times daily before meals. 08/03/20   Loletha Grayer, MD  Insulin Pen Needle 32G X 4 MM MISC 1  Dose by Does not apply route 4 (four) times daily -  before meals and at bedtime. 08/03/20   Loletha Grayer, MD  losartan (COZAAR) 50 MG tablet Take 1 tablet (50 mg total) by mouth daily. 08/03/20 09/02/20  Loletha Grayer, MD  metoCLOPramide (REGLAN) 10 MG tablet Take 1 tablet (10 mg total) by mouth every 6 (six) hours as needed. 06/16/20   Carrie Mew, MD  metoprolol tartrate (LOPRESSOR) 25 MG tablet Take 1 tablet (25 mg total) by mouth 2 (two) times daily. 08/03/20   Loletha Grayer, MD  mirtazapine (REMERON) 30 MG tablet Take 1 tablet (30 mg total) by mouth at bedtime. 05/07/20   Loletha Grayer, MD  pantoprazole (PROTONIX) 40 MG tablet Take 1 tablet (40 mg total) by mouth 2 (two) times daily. 08/03/20   Wieting, Richard, MD  polyethylene glycol (MIRALAX / GLYCOLAX) 17 g packet Take 34 g by mouth daily. 05/22/20   Enzo Bi, MD  sucralfate (CARAFATE) 1 GM/10ML suspension Take 10 mLs (1 g total) by mouth 4 (four) times daily. 06/03/20 06/03/21  Naaman Plummer, MD    Allergies Patient has no known allergies.  Family History  Problem Relation Age of Onset   Heart disease Mother    Prostate cancer Father     Social History Social History   Tobacco Use   Smoking status: Every Day   Smokeless tobacco: Never  Vaping Use   Vaping Use: Never used  Substance Use Topics   Alcohol use: Not Currently   Drug use: Yes    Frequency: 3.0 times per week    Types: Marijuana    Review of Systems  Review of Systems  Constitutional:  Negative for chills and fever.  HENT:  Negative for sore throat.   Eyes:  Negative for pain.  Respiratory:  Positive for cough. Negative for stridor.   Cardiovascular:  Negative for chest pain.  Gastrointestinal:  Positive for abdominal pain, diarrhea, nausea and vomiting.  Genitourinary:  Negative for dysuria.  Musculoskeletal:  Negative for myalgias.  Skin:  Negative for rash.  Neurological:  Negative for seizures, loss of consciousness and headaches.   Psychiatric/Behavioral:  Negative for suicidal ideas.   All other systems reviewed and are negative.    ____________________________________________   PHYSICAL EXAM:  VITAL SIGNS: ED Triage Vitals [08/19/20 2357]  Enc Vitals Group     BP      Pulse      Resp      Temp      Temp src      SpO2      Weight 97 lb (44 kg)     Height _0  (1.575 m)     Head Circumference      Peak Flow      Pain Score      Pain Loc      Pain Edu?      Excl. in [place]?    Vitals:   08/20/20 0600 08/20/20 0605  BP: Marland Kitchen)  153/89   Pulse:  88  Resp:  12  Temp:    SpO2:  100%   Physical Exam Vitals and nursing note reviewed.  Constitutional:      General: She is not in acute distress.    Appearance: She is well-developed. She is ill-appearing.  HENT:     Head: Normocephalic and atraumatic.     Right Ear: External ear normal.     Left Ear: External ear normal.     Nose: Nose normal.     Mouth/Throat:     Mouth: Mucous membranes are dry.  Eyes:     Conjunctiva/sclera: Conjunctivae normal.  Cardiovascular:     Rate and Rhythm: Regular rhythm. Tachycardia present.     Heart sounds: No murmur heard. Pulmonary:     Effort: Pulmonary effort is normal. No respiratory distress.     Breath sounds: Normal breath sounds.  Abdominal:     Palpations: Abdomen is soft.     Tenderness: There is generalized abdominal tenderness (mild throughout, not peritonitic, no guarding,). There is no right CVA tenderness or left CVA tenderness.  Musculoskeletal:     Cervical back: Neck supple.  Skin:    General: Skin is warm and dry.     Capillary Refill: Capillary refill takes more than 3 seconds.  Neurological:     Mental Status: She is alert.  Psychiatric:        Mood and Affect: Mood normal.     ____________________________________________   LABS (all labs ordered are listed, but only abnormal results are displayed)  Labs Reviewed  CBC WITH DIFFERENTIAL/PLATELET - Abnormal; Notable for the  following components:      Result Value   RBC 3.66 (*)    HCT 35.7 (*)    All other components within normal limits  COMPREHENSIVE METABOLIC PANEL - Abnormal; Notable for the following components:   Sodium 128 (*)    Chloride 84 (*)    Glucose, Bld 934 (*)    BUN 45 (*)    Creatinine, Ser 1.65 (*)    Alkaline Phosphatase 176 (*)    Total Bilirubin 1.4 (*)    GFR, Estimated 34 (*)    Anion gap 17 (*)    All other components within normal limits  URINALYSIS, COMPLETE (UACMP) WITH MICROSCOPIC - Abnormal; Notable for the following components:   Color, Urine STRAW (*)    APPearance CLEAR (*)    Glucose, UA >=500 (*)    Ketones, ur 20 (*)    Protein, ur 100 (*)    All other components within normal limits  BETA-HYDROXYBUTYRIC ACID - Abnormal; Notable for the following components:   Beta-Hydroxybutyric Acid 5.44 (*)    All other components within normal limits  BLOOD GAS, VENOUS - Abnormal; Notable for the following components:   Bicarbonate 31.8 (*)    Acid-Base Excess 5.2 (*)    All other components within normal limits  OSMOLALITY - Abnormal; Notable for the following components:   Osmolality 329 (*)    All other components within normal limits  TROPONIN I (HIGH SENSITIVITY) - Abnormal; Notable for the following components:   Troponin I (High Sensitivity) 23 (*)    All other components within normal limits  TROPONIN I (HIGH SENSITIVITY) - Abnormal; Notable for the following components:   Troponin I (High Sensitivity) 27 (*)    All other components within normal limits  RESP PANEL BY RT-PCR (FLU A&B, COVID) ARPGX2  WET PREP, GENITAL  LIPASE, BLOOD  MAGNESIUM  PROCALCITONIN  LACTIC ACID, PLASMA  LACTIC ACID, PLASMA  BASIC METABOLIC PANEL  CBG MONITORING, ED   ____________________________________________  EKG  Sinus tachycardia with ventricular rate of 104, normal axis, unremarkable intervals, borderline right axis deviation and some nonspecific ST changes in anterior  leads without other clearance of acute ischemia. ____________________________________________  RADIOLOGY  ED MD interpretation: Chest x-ray markable for no acute focal consolidation, effusion, edema, pneumothorax or other clear acute intrathoracic process.  CT abdomen pelvis remarkable for no clear acute findings including diverticulitis, SBO, pyelonephritis, kidney stone, pancreatitis or other clear acute process.  No uterine fibroids and advanced atherosclerotic calcifications noted in the aorta and iliac arteries.  Official radiology report(s): DG Chest Portable 1 View  Result Date: 08/20/2020 CLINICAL DATA:  Cough. EXAM: PORTABLE CHEST 1 VIEW COMPARISON:  July 30, 2020 FINDINGS: The lungs are mildly hyperinflated. There is no evidence of and acute infiltrate, pleural effusion or pneumothorax. The heart size and mediastinal contours are within normal limits. A stable rounded opacity is seen overlying the soft tissues of the lateral right chest wall. The visualized skeletal structures are unremarkable. IMPRESSION: Stable exam without acute cardiopulmonary disease. Electronically Signed   By: Virgina Norfolk M.D.   On: 08/20/2020 02:11    ____________________________________________   PROCEDURES  Procedure(s) performed (including Critical Care):  .Critical Care  Date/Time: 08/20/2020 6:13 AM Performed by: Lucrezia Starch, MD Authorized by: Lucrezia Starch, MD   Critical care provider statement:    Critical care time (minutes):  45   Critical care was necessary to treat or prevent imminent or life-threatening deterioration of the following conditions:  Endocrine crisis and dehydration   Critical care was time spent personally by me on the following activities:  Discussions with consultants, evaluation of patient's response to treatment, examination of patient, ordering and performing treatments and interventions, ordering and review of laboratory studies, ordering and review of  radiographic studies, pulse oximetry, re-evaluation of patient's condition, obtaining history from patient or surrogate and review of old charts   ____________________________________________   INITIAL IMPRESSION / [place] / ED COURSE      Patient presents post to history exam for assessment of approximate 1 days of crampy abdominal pain associate with nonbloody nonbilious vomiting and diarrhea as well as increased cough from baseline.  Per report she was hyperglycemic with EMS and received some Zofran and fentanyl.  On arrival she is hypertensive with BP of 193/104 and tachycardic at 104 with otherwise stable vital signs on room air.  Her lungs are clear bilaterally and is some mild tenderness throughout her abdomen but no CVA tenderness and she is not peritoneal guarding.  With regard to cough differential includes possible pneumonia, bronchitis as patient does not appear volume overloaded not elicitation for heart failure.  She is not hypoxic denies any shortness of breath or chest pain Evalose patient for PE at this time.  Will obtain chest x-ray and COVID swab.  No wheezing at this time suggest acute COPD exacerbation edition patient has no evidence of respiratory stress on arrival.   Chest x-ray without focal consolidation to suggest pneumonia or other clear acute thoracic process.  Suspect possible viral bronchitis.  With regard to her GI symptoms of abdominal pain differential includes acute viral gastroenteritis, metabolic derangements including DKA versus HHS, UTI, diverticulitis, pancreatitis, cholecystitis versus possible ischemia.  CBC without leukocytosis or acute anemia.  Lipase of 30 not consistent with acute pancreatitis.  CMP remarkable for chloride of 84, glucose of 934,  BUN of 45 and a creatinine of 1.65 compared to 1.296 days ago with an anion gap of 17.  No evidence of hepatitis.  Alk phos is slightly above normal at 176 and T bili is 1.4.  Beta hydroxybutyrate is  elevated at 5.44.  Suspect this is secondary to starvation ketosis in the setting of HHS as patient VBG is 7.37 with a PCO2 of 55 and a bicarb of 31.8.  He was given 2 L of IV fluids and started on insulin drip.  COVID influenza PCR is negative.  Lactic acid is not elevated.  Do not believe patient is septic at this time.  Urinalysis shows no evidence of infection but ketones and glucose and protein  ECG and minimally elevated troponin at 23 are not consistent with ACS.  Patient's troponin is 457 2 weeks ago.  Suspect some possible persistent troponinemia in the setting of mild CKD versus mild demand ischemia in the setting of severe dehydration from HHS.  CT abdomen pelvis remarkable for no clear acute findings including diverticulitis, SBO, pyelonephritis, kidney stone, pancreatitis or other clear acute process.  No uterine fibroids and advanced atherosclerotic calcifications noted in the aorta and iliac arteries.  Certainly possible patient's abdominal comfort is to some gastroenteritis versus symptoms from her HHS.  We will plan to admit to medicine service for further evaluation and management.    ____________________________________________   FINAL CLINICAL IMPRESSION(S) / ED DIAGNOSES  Final diagnoses:  Hyperosmolar hyperglycemic state (HHS) ([place])  Troponin I above reference range    Medications  insulin regular, human (MYXREDLIN) 100 units/ 100 mL infusion (6.5 Units/hr Intravenous New Bag/Given 08/20/20 0224)  lactated ringers infusion ( Intravenous New Bag/Given 08/20/20 0227)  dextrose 5 % in lactated ringers infusion (0 mLs Intravenous Hold 08/20/20 0227)  dextrose 50 % solution 0-50 mL (has no administration in time range)  lactated ringers bolus 1,000 mL (1,000 mLs Intravenous Bolus 08/20/20 0100)  lactated ringers bolus 1,000 mL (1,000 mLs Intravenous Bolus 08/20/20 0211)  potassium chloride 10 mEq in 100 mL IVPB (0 mEq Intravenous Stopped 08/20/20 0603)  fentaNYL (SUBLIMAZE)  injection 50 mcg (50 mcg Intravenous Given 08/20/20 0207)  iohexol (OMNIPAQUE) 300 MG/ML solution 75 mL (75 mLs Intravenous Contrast Given 08/20/20 0246)     ED Discharge Orders     None        Note:  This document was prepared using Dragon voice recognition software and may include unintentional dictation errors.    Lucrezia Starch, MD 08/20/20 [phone]

## 2020-08-20 NOTE — Progress Notes (Addendum)
Inpatient Diabetes Program Recommendations  AACE/ADA: New Consensus Statement on Inpatient Glycemic Control (2015)  Target Ranges:  Prepandial:   less than 140 mg/dL      Peak postprandial:   less than 180 mg/dL (1-2 hours)      Critically ill patients:  140 - 180 mg/dL   Lab Results  Component Value Date   GLUCAP 132 (H) 08/20/2020   HGBA1C 14.8 (H) 07/30/2020    Review of Glycemic Control Results for Erica Mercer, Erica Mercer (MRN [id number]) as of 08/20/2020 10:23  Ref. Range 08/20/2020 07:24 08/20/2020 08:29 08/20/2020 09:34  Glucose-Capillary Latest Ref Range: 70 - 99 mg/dL 301 (H) 194 (H) 132 (H)  Results for Erica Mercer, Erica Mercer (MRN [id number]) as of 08/20/2020 10:23  Ref. Range 08/20/2020 07:53  Sodium Latest Ref Range: 135 - 145 mmol/L 137  Potassium Latest Ref Range: 3.5 - 5.1 mmol/L 4.2  Chloride Latest Ref Range: 98 - 111 mmol/L 96 (L)  CO2 Latest Ref Range: 22 - 32 mmol/L 31  Glucose Latest Ref Range: 70 - 99 mg/dL 143 (H)  BUN Latest Ref Range: 8 - 23 mg/dL 39 (H)  Creatinine Latest Ref Range: 0.44 - 1.00 mg/dL 1.19 (H)  Calcium Latest Ref Range: 8.9 - 10.3 mg/dL 9.5  Anion gap Latest Ref Range: 5 - 15  10  GFR, Estimated Latest Ref Range: >60 mL/min 51 (L)   Diabetes history: DM 2 Outpatient Diabetes medications:  Lantus 8 units bid, Humalog 5 units tid with meals Current orders for Inpatient glycemic control:  IV insulin- Inpatient Diabetes Program Recommendations:    A1C indicates very poor control of DM management. Consider restarting basal insulin glargine-yfgn (Semglee) 8 units bid 2 hours prior to d/c of insulin drip. Once insulin drip stopped, consider adding Novolog very sensitive (0-6 units) q 4 hours.   Thanks,  Adah Perl, RN, BC-ADM Inpatient Diabetes Coordinator Pager [phone] (8a-5p)    Addendum: spoke with patient at bedside regarding DKA.  She states that she takes her insulin but sometimes forgets.  States that she lives alone.  She rotates her  injection sites. Per note from Dr. Christoper Fabian, patient lives with her daughter but patient did not mention.  On 08/14/20, MD notes that "mental health medicines" were d/c'd and they were looking for "housing for her" to help provide assistance.  Patient was ordered endocrinology f/u as well as DM education. Will order [facility] consult to assist with D/C planning.

## 2020-08-20 NOTE — ED Notes (Signed)
Kayla RN aware of assigned bed 

## 2020-08-20 NOTE — Progress Notes (Signed)
PHARMACIST - PHYSICIAN COMMUNICATION  CONCERNING:  Enoxaparin (Lovenox) for DVT Prophylaxis    RECOMMENDATION: Patient was prescribed enoxaprin '40mg'$  q24 hours for VTE prophylaxis.   Filed Weights   08/19/20 2357  Weight: 44 kg (97 lb)    Body mass index is 17.74 kg/m.  Estimated Creatinine Clearance: 28.2 mL/min (A) (by C-G formula based on SCr of 1.38 mg/dL (H)).   Patient is candidate for enoxaparin '30mg'$  every 24 hours based on CrCl <55m/min or Weight <45kg  DESCRIPTION: [facility]  Patient is now receiving enoxaparin 30 mg every 24 hours    SPernell Dupre PharmD Clinical Pharmacist  08/20/2020 8:02 AM

## 2020-08-20 NOTE — ED Notes (Signed)
Patient cleaned with new bed linen by Barbaraann Faster. Patient had incontinent urine episode.

## 2020-08-20 NOTE — ED Notes (Signed)
This [place] at this time for [facility] due

## 2020-08-20 NOTE — ED Notes (Signed)
MD Creig Hines. Notified of Osmolality 329

## 2020-08-20 NOTE — H&P (Addendum)
History and Physical    Erica Mercer [id number] DOB: 05/11/1955 DOA: 08/19/2020  Referring MD/NP/PA:   PCP: [name], NP   Patient coming from:  The patient is coming from home.  At baseline, pt is independent for most of ADL.        Chief Complaint: Nausea, vomiting, diarrhea, abdominal pain and chest pain.  HPI: Erica Mercer is a 65 y.o. female with medical history significant of hypertension, hyperlipidemia, diabetes mellitus, COPD, GERD, depression, CAD, bipolar, DKA, HHS, cocaine abuse, tobacco abuse, CKD stage IIIa, who presents with nausea vomiting, diarrhea, abdominal pain or chest pain.  Patient states that her symptoms started yesterday, including nausea, vomiting, diarrhea and abdominal pain.  She states that she has had several times of nonbilious nonbloody vomiting.  She has had 1 loose stool bowel movement today.  Her abdominal pain is located in the epigastric area, cramp-like, sharp, moderate, nonradiating. She also reports some chest pain which is located in the substernal area, pressure-like, nonradiating, mild to moderate.  She has mild dry cough, no shortness breath, fever or chills.  She states she has mild vaginal itching and little discharge.  Denies symptoms of UTI.   ED Course: pt was found to have DKA (blood sugar 934, anion gap 17, beta hydroxybutyric acid 5.44), WBC 4.8, troponin level 23, 27, lactic acid 1.4, 1.5, urinalysis negative for urinalysis indicating, lipase 30, renal function close to baseline.  Temperature normal, blood pressure 193/104, 102/69, heart rate 104, 76, RR 21, 13, oxygen saturation 97% on room air.  CT of abdomen/pelvis is not impressive.  Patient is admitted to stepdown as inpatient by accepting MD.   CT-abd/pelvis: 1. No acute abdominal/pelvic findings, mass lesions or adenopathy. 2. Age advanced atherosclerotic calcifications involving the aorta and iliac arteries. 3. Moderate bladder distension. 4. Uterine  fibroids.  Aortic Atherosclerosis (ICD10-I70.0).  Review of Systems:   General: no fevers, chills, no body weight gain, has fatigue HEENT: no blurry vision, hearing changes or sore throat Respiratory: no dyspnea, has coughing, no wheezing CV: has chest pain, no palpitations GI: has nausea, vomiting, abdominal pain, diarrhea, no constipation GU: no dysuria, burning on urination, increased urinary frequency, hematuria. Has vaginal itching Ext: no leg edema Neuro: no unilateral weakness, numbness, or tingling, no vision change or hearing loss Skin: no rash, no skin tear. MSK: No muscle spasm, no deformity, no limitation of range of movement in spin Heme: No easy bruising.  Travel history: No recent long distant travel.  Allergy: No Known Allergies  Past Medical History:  Diagnosis Date   Bipolar 1 disorder ([place])    CAD (coronary artery disease)    Diabetes mellitus without complication ([place])    HLD (hyperlipidemia)    HTN (hypertension)     Past Surgical History:  Procedure Laterality Date   COLONOSCOPY     ESOPHAGOGASTRODUODENOSCOPY      Social History:  reports that she has been smoking. She has never used smokeless tobacco. She reports previous alcohol use. She reports current drug use. Frequency: 3.00 times per week. Drug: Marijuana.  Family History:  Family History  Problem Relation Age of Onset   Heart disease Mother    Prostate cancer Father      Prior to Admission medications   Medication Sig Start Date End Date Taking? Authorizing Provider  polyethylene glycol powder (GLYCOLAX/MIRALAX) 17 GM/SCOOP powder Dissolve 1 heaping tablespoon in 8 oz of water, juice, soda, and drink once daily. 08/08/20  Yes [provider]  sucralfate (CARAFATE) 1 g tablet Take 1 g by mouth 4 (four) times daily. Before meals and at bedtime. 08/08/20 08/08/21 Yes [provider]  albuterol (VENTOLIN HFA) 108 (90 Base) MCG/ACT inhaler Inhale 2 puffs into the lungs every 6  (six) hours as needed for wheezing. 05/07/20 05/02/21  Loletha Grayer, MD  amLODipine (NORVASC) 10 MG tablet Take 1 tablet (10 mg total) by mouth daily. 08/03/20 09/02/20  Loletha Grayer, MD  aspirin EC 81 MG EC tablet Take 1 tablet (81 mg total) by mouth daily. Swallow whole. 08/04/20   Loletha Grayer, MD  atorvastatin (LIPITOR) 40 MG tablet Take 1 tablet (40 mg total) by mouth at bedtime. 08/03/20 09/02/20  Loletha Grayer, MD  famotidine (PEPCID) 20 MG tablet Take 1 tablet (20 mg total) by mouth at bedtime. 08/03/20   Loletha Grayer, MD  insulin glargine (LANTUS SOLOSTAR) 100 UNIT/ML Solostar Pen Inject 8 Units into the skin 2 (two) times daily. 08/03/20 09/02/20  Loletha Grayer, MD  insulin lispro (HUMALOG) 100 UNIT/ML KwikPen Inject 5 Units into the skin 3 (three) times daily before meals. 08/03/20   Loletha Grayer, MD  Insulin Pen Needle 32G X 4 MM MISC 1 Dose by Does not apply route 4 (four) times daily -  before meals and at bedtime. 08/03/20   Loletha Grayer, MD  losartan (COZAAR) 50 MG tablet Take 1 tablet (50 mg total) by mouth daily. 08/03/20 09/02/20  Loletha Grayer, MD  metoCLOPramide (REGLAN) 10 MG tablet Take 1 tablet (10 mg total) by mouth every 6 (six) hours as needed. 06/16/20   Carrie Mew, MD  metoprolol tartrate (LOPRESSOR) 25 MG tablet Take 1 tablet (25 mg total) by mouth 2 (two) times daily. 08/03/20   Loletha Grayer, MD  mirtazapine (REMERON) 15 MG tablet Take 15 mg by mouth at bedtime. 08/14/20   [provider]  mirtazapine (REMERON) 30 MG tablet Take 1 tablet (30 mg total) by mouth at bedtime. 05/07/20   Loletha Grayer, MD  pantoprazole (PROTONIX) 40 MG tablet Take 1 tablet (40 mg total) by mouth 2 (two) times daily. 08/03/20   Wieting, Richard, MD  polyethylene glycol (MIRALAX / GLYCOLAX) 17 g packet Take 34 g by mouth daily. 05/22/20   Enzo Bi, MD  sucralfate (CARAFATE) 1 GM/10ML suspension Take 10 mLs (1 g total) by mouth 4 (four) times daily. 06/03/20  06/03/21  Naaman Plummer, MD    Physical Exam: Vitals:   08/20/20 0530 08/20/20 0600 08/20/20 0605 08/20/20 0630  BP: 119/78 (!) 153/89  102/69  Pulse:   88 76  Resp:   12 13  Temp:      TempSrc:      SpO2:   100% 100%  Weight:      Height:       General: Not in acute distress.  Dry mucous membrane HEENT:       Eyes: PERRL, EOMI, no scleral icterus.       ENT: No discharge from the ears and nose, no pharynx injection, no tonsillar enlargement.        Neck: No JVD, no bruit, no mass felt. Heme: No neck lymph node enlargement. Cardiac: S1/S2, RRR, No murmurs, No gallops or rubs. Respiratory: No rales, wheezing, rhonchi or rubs. GI: Soft, nondistended, has tenderness in epigastric area, no rebound pain, no organomegaly, BS present. GU: No hematuria Ext: No pitting leg edema bilaterally. 1+DP/PT pulse bilaterally. Musculoskeletal: No joint deformities, No joint redness or warmth, no limitation of ROM in spin. Skin:  No rashes.  Neuro: Alert, oriented X3, cranial nerves II-XII grossly intact, moves all extremities normally.  Psych: Patient is not psychotic, no suicidal or hemocidal ideation.  Labs on Admission: I have personally reviewed following labs and imaging studies  CBC: Recent Labs  Lab 08/14/20 1159 08/20/20 0002  WBC 5.0 4.8  NEUTROABS  --  3.2  HGB 10.7* 12.4  HCT 31.5* 35.7*  MCV 96.3 97.5  PLT 316 [id number]   Basic Metabolic Panel: Recent Labs  Lab 08/14/20 1159 08/20/20 0002 08/20/20 0415  NA 130* 128* 134*  K 4.6 4.7 4.3  CL 91* 84* 95*  CO2 '27 27 29  '$ GLUCOSE 657* 934* 465*  BUN 32* 45* 42*  CREATININE 1.29* 1.65* 1.38*  CALCIUM 9.2 9.5 9.6  MG  --  2.2  --    GFR: Estimated Creatinine Clearance: 28.2 mL/min (A) (by C-G formula based on SCr of 1.38 mg/dL (H)). Liver Function Tests: Recent Labs  Lab 08/20/20 0002  AST 36  ALT 38  ALKPHOS 176*  BILITOT 1.4*  PROT 6.7  ALBUMIN 3.7   Recent Labs  Lab 08/20/20 0002  LIPASE 30   No results  for input(s): AMMONIA in the last 168 hours. Coagulation Profile: No results for input(s): INR, PROTIME in the last 168 hours. Cardiac Enzymes: No results for input(s): CKTOTAL, CKMB, CKMBINDEX, TROPONINI in the last 168 hours. BNP (last 3 results) No results for input(s): PROBNP in the last 8760 hours. HbA1C: No results for input(s): HGBA1C in the last 72 hours. CBG: Recent Labs  Lab 08/14/20 1153 08/14/20 1427 08/14/20 1617 08/20/20 0724 08/20/20 0829  GLUCAP 567* 471* 198* 301* 194*   Lipid Profile: No results for input(s): CHOL, HDL, LDLCALC, TRIG, CHOLHDL, LDLDIRECT in the last 72 hours. Thyroid Function Tests: No results for input(s): TSH, T4TOTAL, FREET4, T3FREE, THYROIDAB in the last 72 hours. Anemia Panel: No results for input(s): VITAMINB12, FOLATE, FERRITIN, TIBC, IRON, RETICCTPCT in the last 72 hours. Urine analysis:    Component Value Date/Time   COLORURINE STRAW (A) 08/20/2020 0049   APPEARANCEUR CLEAR (A) 08/20/2020 0049   LABSPEC 1.021 08/20/2020 0049   PHURINE 6.0 08/20/2020 0049   GLUCOSEU >=500 (A) 08/20/2020 0049   HGBUR NEGATIVE 08/20/2020 0049   BILIRUBINUR NEGATIVE 08/20/2020 0049   KETONESUR 20 (A) 08/20/2020 0049   PROTEINUR 100 (A) 08/20/2020 0049   NITRITE NEGATIVE 08/20/2020 0049   LEUKOCYTESUR NEGATIVE 08/20/2020 0049   Sepsis Labs: '@LABRCNTIP'$ (procalcitonin:4,lacticidven:4) ) Recent Results (from the past 240 hour(s))  Resp Panel by RT-PCR (Flu A&B, Covid) Nasopharyngeal Swab     Status: None   Collection Time: 08/20/20 12:49 AM   Specimen: Nasopharyngeal Swab; Nasopharyngeal(NP) swabs in vial transport medium  Result Value Ref Range Status   SARS Coronavirus 2 by RT PCR NEGATIVE NEGATIVE Final    Comment: (NOTE) SARS-CoV-2 target nucleic acids are NOT DETECTED.  The SARS-CoV-2 RNA is generally detectable in upper respiratory specimens during the acute phase of infection. The lowest concentration of SARS-CoV-2 viral copies this assay  can detect is 138 copies/mL. A negative result does not preclude SARS-Cov-2 infection and should not be used as the sole basis for treatment or other patient management decisions. A negative result may occur with  improper specimen collection/handling, submission of specimen other than nasopharyngeal swab, presence of viral mutation(s) within the areas targeted by this assay, and inadequate number of viral copies(<138 copies/mL). A negative result must be combined with clinical observations, patient history, and epidemiological information. The expected  result is Negative.  Fact Sheet for Patients:  EntrepreneurPulse.com.au  Fact Sheet for Healthcare Providers:  IncredibleEmployment.be  This test is no t yet approved or cleared by the [place] FDA and  has been authorized for detection and/or diagnosis of SARS-CoV-2 by FDA under an Emergency Use Authorization (EUA). This EUA will remain  in effect (meaning this test can be used) for the duration of the COVID-19 declaration under Section 564(b)(1) of the Act, 21 U.S.C.section 360bbb-3(b)(1), unless the authorization is terminated  or revoked sooner.       Influenza A by PCR NEGATIVE NEGATIVE Final   Influenza B by PCR NEGATIVE NEGATIVE Final    Comment: (NOTE) The Xpert Xpress SARS-CoV-2/FLU/RSV plus assay is intended as an aid in the diagnosis of influenza from Nasopharyngeal swab specimens and should not be used as a sole basis for treatment. Nasal washings and aspirates are unacceptable for Xpert Xpress SARS-CoV-2/FLU/RSV testing.  Fact Sheet for Patients: EntrepreneurPulse.com.au  Fact Sheet for Healthcare Providers: IncredibleEmployment.be  This test is not yet approved or cleared by the [place] FDA and has been authorized for detection and/or diagnosis of SARS-CoV-2 by FDA under an Emergency Use Authorization (EUA). This EUA will  remain in effect (meaning this test can be used) for the duration of the COVID-19 declaration under Section 564(b)(1) of the Act, 21 U.S.C. section 360bbb-3(b)(1), unless the authorization is terminated or revoked.  Performed at [facility], [address] [place], [place] [address]      Radiological Exams on Admission: CT ABDOMEN PELVIS W CONTRAST  Result Date: 08/20/2020 CLINICAL DATA:  Abdominal pain, nausea, vomiting and hyperglycemia. EXAM: CT ABDOMEN AND PELVIS WITH CONTRAST TECHNIQUE: Multidetector CT imaging of the abdomen and pelvis was performed using the standard protocol following bolus administration of intravenous contrast. CONTRAST:  37m OMNIPAQUE IOHEXOL 300 MG/ML  SOLN COMPARISON:  07/30/2020 FINDINGS: Lower chest: The lung bases are clear of an acute process. No pleural effusions or pulmonary lesions. Heart is within normal limits in size. Aortic and coronary artery calcifications are noted. Hepatobiliary: No hepatic lesions or intrahepatic biliary dilatation. The gallbladder is contracted. No common bile duct dilatation. Pancreas: No mass, inflammation or ductal dilatation. Moderate pancreatic atrophy. Spleen: Normal size. No focal lesions. Adrenals/Urinary Tract: Adrenal glands and kidneys are unremarkable. No renal lesions or evidence of pyelonephritis. No hydronephrosis. Calcification in the right renal hilum appears to be vascular. The bladder is mildly distended but no bladder mass or wall thickening. Stomach/Bowel: The stomach, duodenum, small bowel and colon are grossly normal without oral contrast. No obvious acute inflammatory process, mass lesions obstructive findings. The terminal ileum is normal. The appendix is normal. Vascular/Lymphatic: Age advanced atherosclerotic calcifications involving the aorta and iliac arteries but no focal aneurysm or dissection. The branch vessels are patent. Branch vessel calcifications are noted. No abdominal/pelvic  lymphadenopathy. Reproductive: The uterus demonstrates multiple fibroids, some of which are calcified. The ovaries are unremarkable. Parametrial vascular calcifications. Other: No pelvic mass or adenopathy. No free pelvic fluid collections. No inguinal mass or adenopathy. No abdominal wall hernia or subcutaneous lesions. Musculoskeletal: No significant bony findings. IMPRESSION: 1. No acute abdominal/pelvic findings, mass lesions or adenopathy. 2. Age advanced atherosclerotic calcifications involving the aorta and iliac arteries. 3. Moderate bladder distension. 4. Uterine fibroids. Aortic Atherosclerosis (ICD10-I70.0). Electronically Signed   By: PMarijo SanesM.D.   On: 08/20/2020 05:19   DG Chest Portable 1 View  Result Date: 08/20/2020 CLINICAL DATA:  Cough. EXAM: PORTABLE CHEST 1 VIEW COMPARISON:  July 30, 2020 FINDINGS: The lungs are mildly hyperinflated. There is no evidence of and acute infiltrate, pleural effusion or pneumothorax. The heart size and mediastinal contours are within normal limits. A stable rounded opacity is seen overlying the soft tissues of the lateral right chest wall. The visualized skeletal structures are unremarkable. IMPRESSION: Stable exam without acute cardiopulmonary disease. Electronically Signed   By: Virgina Norfolk M.D.   On: 08/20/2020 02:11     EKG: I have personally reviewed.  Sinus rhythm, QTC 473, bilateral atrial enlargement, nonspecific T wave change  Assessment/Plan Principal Problem:   DKA (diabetic ketoacidosis) ([place]) Active Problems:   Essential hypertension   HLD (hyperlipidemia)   COPD (chronic obstructive pulmonary disease) ([facility])   CAD (coronary artery disease)   Abdominal pain   GERD (gastroesophageal reflux disease)   Tobacco abuse   Depression   Elevated troponin   Type II diabetes mellitus with renal manifestations ([facility])   Nausea vomiting and diarrhea   Protein-calorie malnutrition, moderate ([facility])   CKD (chronic kidney disease), stage  IIIa   Vaginal itching   DKA (diabetic ketoacidosis) vs. HHS: pt seems to have early stage of DKA vs. HHS with blood sugar 934, anion gap 17, beta hydroxybutyric acid 5.44. Her UA is negative for ketone and VBG showed pH 7.37.  Mental status normal.  - Admit to stepdown --> changed to med-surg - IVF:  2L of LR bolus - IVF: LR at 125 cc/h, will switch to D5-LR at 125 cc/h when CBG<250 - replete K as needed - Zofran prn nausea  - NPO  - consult to diabetic educator  Type II diabetes mellitus with renal manifestations: Patient states that she is taking Humalog and Lantus 8 unit twice daily now has DKA.  Recent A1c 14.8, poorly controlled. -Patient is DKA protocol  Addendum: the repeated BMP showed AG is closed.  -will start transition to glargin insulin 8 units bid and SSI -stop insulin gtt and IVF at 2 hours after giving lantus -change bed to med-surg  Nausea vomiting and diarrhea and abdominal pain: Etiology is not clear.  CT abdomen/pelvis is negative for acute issues.  Lipase normal 30.  Patient just had 1 loose stool bowel movement today, no fever or leukocytosis, low suspicions for CAD. -Supportive care -IV fluid as above -As needed Zofran -Patient is on Protonix and Pepcid for GERD  Elevated troponin, chest pain and hx of CAD: trop 23 -->27.  Possibly due to demand ischemia. -Trend troponin -Aspirin, Lipitor -Check A1c, FLP, UDS  Essential hypertension -Amlodipine, Cozaar, metoprolol, -IV hydralazine.  HLD (hyperlipidemia) -Lipitor  COPD (chronic obstructive pulmonary disease) ([facility]) -As needed albuterol -As needed Mucinex  GERD (gastroesophageal reflux disease) -Continue home Protonix and Pepcid -Patient is also on as needed Carafate  Tobacco abuse -Nicotine patch  Depression -Continue home medications  Protein-calorie malnutrition, moderate ([place]): BMI 17.74. -Nutrition consult  CKD (chronic kidney disease), stage IIIa: Baseline creatinine 1.1-1.4.  Her  creatinine is 1.38, BUN 42, renal function is close to baseline. -f/u with BMP  Vaginal itching: F/u Wet Prep test         DVT ppx: SQ Lovenox Code Status: Full code Family Communication: I called her daughter by phone Disposition Plan:  Anticipate discharge back to previous environment Consults called:  none Admission status and Level of care: Stepdown:   SDU/inpation          Status is: Inpatient  Remains inpatient appropriate because:Inpatient level of care appropriate due to severity of illness  Dispo: The patient is from: Home              Anticipated d/c is to: Home              Patient currently is not medically stable to d/c.   Difficult to place patient No          Date of Service 08/20/2020    [facility]   If 7PM-7AM, please contact night-coverage [email or web address] 08/20/2020, 9:02 AM

## 2020-08-21 DIAGNOSIS — E1010 Type 1 diabetes mellitus with ketoacidosis without coma: Secondary | ICD-10-CM | POA: Diagnosis not present

## 2020-08-21 DIAGNOSIS — N1831 Chronic kidney disease, stage 3a: Secondary | ICD-10-CM | POA: Diagnosis not present

## 2020-08-21 LAB — GLUCOSE, CAPILLARY
Glucose-Capillary: 122 mg/dL — ABNORMAL HIGH (ref 70–99)
Glucose-Capillary: 176 mg/dL — ABNORMAL HIGH (ref 70–99)
Glucose-Capillary: 226 mg/dL — ABNORMAL HIGH (ref 70–99)
Glucose-Capillary: 236 mg/dL — ABNORMAL HIGH (ref 70–99)
Glucose-Capillary: 262 mg/dL — ABNORMAL HIGH (ref 70–99)
Glucose-Capillary: 325 mg/dL — ABNORMAL HIGH (ref 70–99)
Glucose-Capillary: 425 mg/dL — ABNORMAL HIGH (ref 70–99)

## 2020-08-21 LAB — BASIC METABOLIC PANEL
Anion gap: 11 (ref 5–15)
BUN: 40 mg/dL — ABNORMAL HIGH (ref 8–23)
CO2: 28 mmol/L (ref 22–32)
Calcium: 9 mg/dL (ref 8.9–10.3)
Chloride: 94 mmol/L — ABNORMAL LOW (ref 98–111)
Creatinine, Ser: 1.83 mg/dL — ABNORMAL HIGH (ref 0.44–1.00)
GFR, Estimated: 30 mL/min — ABNORMAL LOW (ref >60)
Glucose, Bld: 232 mg/dL — ABNORMAL HIGH (ref 70–99)
Potassium: 3.9 mmol/L (ref 3.5–5.1)
Sodium: 133 mmol/L — ABNORMAL LOW (ref 135–145)

## 2020-08-21 LAB — LIPID PANEL
Cholesterol: 267 mg/dL — ABNORMAL HIGH (ref 0–200)
HDL: 65 mg/dL (ref >40)
LDL Cholesterol: 153 mg/dL — ABNORMAL HIGH (ref 0–99)
Total CHOL/HDL Ratio: 4.1 RATIO
Triglycerides: 243 mg/dL — ABNORMAL HIGH (ref <150)
VLDL: 49 mg/dL — ABNORMAL HIGH (ref 0–40)

## 2020-08-21 LAB — HEMOGLOBIN A1C
Hgb A1c MFr Bld: 15.4 % — ABNORMAL HIGH (ref 4.8–5.6)
Mean Plasma Glucose: 395 mg/dL

## 2020-08-21 MED ORDER — GLUCERNA SHAKE PO LIQD
237.0000 mL | Freq: Three times a day (TID) | ORAL | Status: DC
  Administered 2020-08-21 – 2020-08-22 (×5): 237 mL via ORAL

## 2020-08-21 MED ORDER — INSULIN GLARGINE-YFGN 100 UNIT/ML ~~LOC~~ SOLN: 10.0000 [IU] | Freq: Two times a day (BID) | SUBCUTANEOUS | Status: DC

## 2020-08-21 MED ORDER — LACTATED RINGERS IV SOLN: INTRAVENOUS | Status: DC

## 2020-08-21 MED ORDER — INSULIN ASPART 100 UNIT/ML IJ SOLN
5.0000 [IU] | Freq: Three times a day (TID) | INTRAMUSCULAR | Status: DC
  Administered 2020-08-21 – 2020-08-22 (×3): 5 [IU] via SUBCUTANEOUS
  Filled 2020-08-21 (×3): qty 1

## 2020-08-21 MED ORDER — ADULT MULTIVITAMIN W/MINERALS CH
1.0000 | ORAL_TABLET | Freq: Every day | ORAL | Status: DC
  Administered 2020-08-21 – 2020-08-23 (×3): 1 via ORAL
  Filled 2020-08-21 (×3): qty 1

## 2020-08-21 MED ORDER — LACTULOSE 10 GM/15ML PO SOLN
20.0000 g | Freq: Once | ORAL | Status: AC
  Administered 2020-08-21: 10:00:00 20 g via ORAL
  Filled 2020-08-21: qty 30

## 2020-08-21 MED ORDER — INSULIN GLARGINE-YFGN 100 UNIT/ML ~~LOC~~ SOLN
8.0000 [IU] | Freq: Two times a day (BID) | SUBCUTANEOUS | Status: DC
  Administered 2020-08-21: 8 [IU] via SUBCUTANEOUS
  Filled 2020-08-21 (×3): qty 0.08

## 2020-08-21 MED ORDER — INSULIN ASPART 100 UNIT/ML IJ SOLN
0.0000 [IU] | Freq: Three times a day (TID) | INTRAMUSCULAR | Status: DC
  Administered 2020-08-21: 13:00:00 3 [IU] via SUBCUTANEOUS
  Administered 2020-08-21: 16:00:00 1 [IU] via SUBCUTANEOUS
  Administered 2020-08-22: 08:00:00 9 [IU] via SUBCUTANEOUS
  Filled 2020-08-21 (×3): qty 1

## 2020-08-21 MED ORDER — SODIUM CHLORIDE 0.9 % IV SOLN: INTRAVENOUS | Status: DC

## 2020-08-21 NOTE — TOC Initial Note (Signed)
Transition of Care [facility]) - Initial/Assessment Note    Patient Details  Name: Erica Mercer MRN: [id number] Date of Birth: 01/22/1956  Transition of Care [facility]) CM/SW Contact:    Shelbie Hutching, RN Phone Number: 08/21/2020, 2:58 PM  Clinical Narrative:                 Patient admitted to the hospital with DKA.  RNCM met with patient at the bedside this afternoon.  She currently lives with her daughter but reports wanting to move into her own place closer to [facility].  Patient is current with her PCP.  She reports that she has been taking her insulin but that she has no gotten a new glucometer since hers broke.  Her PCP did order one for her but she reports not being able to pay the copay for it.  RNCM gave patient a glucometer, donated by [employer].  Patient gets her prescriptions from [facility] in [place].   Patient will need a ride home tomorrow.    Expected Discharge Plan: [place] Barriers to Discharge: Continued Medical Work up   Patient Goals and CMS Choice Patient states their goals for this hospitalization and ongoing recovery are:: Patient will be glad to go home tomorrow.  wants to get out of her daughter's place and get closer to [facility].gov Compare Post Acute Care list provided to:: Patient Choice offered to / list presented to : Patient  Expected Discharge Plan and Services Expected Discharge Plan: [place]   Discharge Planning Services: CM Consult Post Acute Care Choice: [place] arrangements for the past 2 months: Single Family Home                 DME Arranged: N/A DME Agency: NA       HH Arranged: RN, PT, OT, Social Work [place] Agency: [place] Date [place]: 08/21/20 Time [place]: [place] Representative spoke with at [place]: [place]  Prior Living Arrangements/Services Living arrangements for the past 2 months: [place] Lives with:: Adult  Children Patient language and need for interpreter reviewed:: Yes Do you feel safe going back to the place where you live?: Yes      Need for Family Participation in Patient Care: Yes (Comment) Care giver support system in place?: Yes (comment) Current home services: DME (walker and 3 in 1) Criminal Activity/Legal Involvement Pertinent to Current Situation/Hospitalization: No - Comment as needed  Activities of Daily Living Home Assistive Devices/Equipment: Walker (specify type), Cane (specify quad or straight) ADL Screening (condition at time of admission) Patient's cognitive ability adequate to safely complete daily activities?: Yes Is the patient deaf or have difficulty hearing?: No Does the patient have difficulty seeing, even when wearing glasses/contacts?: No Does the patient have difficulty concentrating, remembering, or making decisions?: No Patient able to express need for assistance with ADLs?: Yes Does the patient have difficulty dressing or bathing?: No Independently performs ADLs?: Yes (appropriate for developmental age) Does the patient have difficulty walking or climbing stairs?: Yes Weakness of Legs: None Weakness of Arms/Hands: None  Permission Sought/Granted Permission sought to share information with : Case Manager, Customer service manager, Family Supports Permission granted to share information with : Yes, Verbal Permission Granted  Share Information with NAME: Pearson Forster  Permission granted to share info w AGENCY: [facility]  Permission granted to share info w Relationship: daughter     Emotional Assessment Appearance:: Appears stated age  Attitude/Demeanor/Rapport: Engaged Affect (typically observed): Accepting Orientation: : Oriented to Self, Oriented to Place, Oriented to  Time, Oriented to Situation Alcohol / Substance Use: Not Applicable Psych Involvement: No (comment)  Admission diagnosis:  DKA (diabetic ketoacidosis) ([place]) [E11.10] Troponin I  above reference range [R77.8] Hyperosmolar hyperglycemic state (HHS) ([place]) [E11.00, E11.65] Patient Active Problem List   Diagnosis Date Noted   Type II diabetes mellitus with renal manifestations ([place]) 08/20/2020   Nausea vomiting and diarrhea 08/20/2020   Protein-calorie malnutrition, moderate ([place]) 08/20/2020   CKD (chronic kidney disease), stage IIIa 08/20/2020   Vaginal itching 08/20/2020   Type 2 diabetes mellitus with hypoglycemia without coma, with long-term current use of insulin (HCC)    Elevated troponin    Weakness    Lactic acidosis    Hyperkalemia 07/30/2020   Hyponatremia 10/31/1217   Acute metabolic encephalopathy 75/88/3254   Hypertensive urgency 07/30/2020   Hematemesis of unknown etiology 07/30/2020   Meningitis 07/30/2020   Stage 3a chronic kidney disease ([place])    CAD (coronary artery disease) 05/05/2020   Hyperosmolar hyperglycemic state (HHS) ([place]) 05/05/2020   Abdominal pain 05/05/2020   GERD (gastroesophageal reflux disease) 05/05/2020   Tobacco abuse 05/05/2020   Left arm numbness 05/05/2020   Depression 05/05/2020   Hyperglycemia    AKI (acute kidney injury) ([place]) 04/30/2020   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[place]) 04/30/2020   Sepsis ([place]) 04/30/2020   DKA, type 1 ([place]) 02/26/2020   Acute kidney injury superimposed on CKD ([place])    DKA (diabetic ketoacidosis) ([place]) 02/25/2020   Malnutrition ([place]) 02/25/2020   Essential hypertension 02/25/2020   HLD (hyperlipidemia) 02/25/2020   Soft tissue mass 02/25/2020   H/O cocaine abuse ([place]) 02/17/2018   Bipolar 1 disorder ([place]) 04/22/2016   NSTEMI (non-ST elevated myocardial infarction) ([place]) 04/22/2016   COPD (chronic obstructive pulmonary disease) ([place]) 01/22/2016   PCP:  [name], NP Pharmacy:   [id number] - [address], [address] [place] [address] Phone: [phone] Fax: [phone]     Social  Determinants of Health (SDOH) Interventions    Readmission Risk Interventions Readmission Risk Prevention Plan 08/21/2020  Transportation Screening Complete  Medication Review (RN Care Manager) Complete  PCP or Specialist appointment within 3-5 days of discharge Complete  HRI or [place] Complete  SW Recovery Care/Counseling Consult Complete  Palliative Care Screening Not [place] Not Applicable

## 2020-08-21 NOTE — Progress Notes (Signed)
PROGRESS NOTE    Erica Mercer  [id number] DOB: 03-18-1955 DOA: 08/19/2020 PCP: [name], NP   Chief complaint.  Nausea vomiting abdominal pain. Brief Narrative:  Erica Mercer is a 65 y.o. female with medical history significant of hypertension, hyperlipidemia, diabetes mellitus, COPD, GERD, depression, CAD, bipolar, DKA, HHS, cocaine abuse, tobacco abuse, CKD stage IIIa, who presents with nausea vomiting, diarrhea, abdominal pain and chest pain. Patient has severe hyperglycemia with glucose 934, anion gap 17, she is placed on insulin drip for diabetic ketoacidosis.  Her anion gap closed pretty quickly, she is then placed on subcu insulin.  Assessment & Plan:   Principal Problem:   DKA (diabetic ketoacidosis) ([place]) Active Problems:   Essential hypertension   HLD (hyperlipidemia)   COPD (chronic obstructive pulmonary disease) (HCC)   CAD (coronary artery disease)   Abdominal pain   GERD (gastroesophageal reflux disease)   Tobacco abuse   Depression   Elevated troponin   Type II diabetes mellitus with renal manifestations (HCC)   Nausea vomiting and diarrhea   Protein-calorie malnutrition, moderate (HCC)   CKD (chronic kidney disease), stage IIIa   Vaginal itching  #1.  Uncontrolled type 2 diabetes with DKA. Condition had improved.  Continue Lantus at 80 units twice a day, NovoLog 5 units 3 times a day and sliding scale insulin. Hemoglobin A1c 14.8 recently.  #2.  Chronic kidney disease stage IIIa. Patient has worsening renal function today, but does not meet acute kidney injury criteria.  I will start IV fluids and recheck renal function tomorrow.  3.  Essential hypertension. COPD. Conditions are stable.       DVT prophylaxis: Lovenox Code Status: full Family Communication:  Disposition Plan:    Status is: Inpatient  Remains inpatient appropriate because:IV treatments appropriate due to intensity of illness or inability to take PO and Inpatient  level of care appropriate due to severity of illness  Dispo: The patient is from: Home              Anticipated d/c is to: Home              Patient currently is not medically stable to d/c.   Difficult to place patient No        I/O last 3 completed shifts: In: 1232 [I.V.:1032.1; IV Piggyback:199.9] Out: 400 [Urine:400] Total I/O In: 240 [P.O.:240] Out: -      Consultants:  none  Procedures: none  Antimicrobials: none  Subjective: Patient doing better today, no additional abdominal pain/chest pain.  No nausea vomiting or diarrhea.  States that that she is constipated, want stool softener. Denies any short of breath or cough. No dysuria or hematuria.  Objective: Vitals:   08/20/20 1130 08/20/20 2058 08/21/20 0355 08/21/20 0710  BP: (!) 146/79 (!) 142/85 127/79 (!) 150/81  Pulse: 71 77 69 68  Resp:  '18 18 16  '$ Temp:  98.5 F (36.9 C) 98 F (36.7 C) 98.5 F (36.9 C)  TempSrc:      SpO2: 96% 99% 100% 100%  Weight:      Height:        Intake/Output Summary (Last 24 hours) at 08/21/2020 1043 Last data filed at 08/21/2020 1012 Gross per 24 hour  Intake 1272.11 ml  Output 400 ml  Net 872.11 ml   Filed Weights   08/19/20 2357  Weight: 44 kg    Examination:  General exam: Appears calm and comfortable  Respiratory system: Clear to auscultation. Respiratory effort normal. Cardiovascular system: S1 &  S2 heard, RRR. No JVD, murmurs, rubs, gallops or clicks. No pedal edema. Gastrointestinal system: Abdomen is nondistended, soft and nontender. No organomegaly or masses felt. Normal bowel sounds heard. Central nervous system: Alert and oriented x3. No focal neurological deficits. Extremities: Symmetric 5 x 5 power. Skin: No rashes, lesions or ulcers Psychiatry: Judgement and insight appear normal. Mood & affect appropriate.     Data Reviewed: I have personally reviewed following labs and imaging studies  CBC: Recent Labs  Lab 08/14/20 1159 08/20/20 0002   WBC 5.0 4.8  NEUTROABS  --  3.2  HGB 10.7* 12.4  HCT 31.5* 35.7*  MCV 96.3 97.5  PLT 316 [id number]   Basic Metabolic Panel: Recent Labs  Lab 08/14/20 1159 08/20/20 0002 08/20/20 0415 08/20/20 0753 08/21/20 0432  NA 130* 128* 134* 137 133*  K 4.6 4.7 4.3 4.2 3.9  CL 91* 84* 95* 96* 94*  CO2 '27 27 29 31 28  '$ GLUCOSE 657* 934* 465* 143* 232*  BUN 32* 45* 42* 39* 40*  CREATININE 1.29* 1.65* 1.38* 1.19* 1.83*  CALCIUM 9.2 9.5 9.6 9.5 9.0  MG  --  2.2  --   --   --    GFR: Estimated Creatinine Clearance: 21.3 mL/min (A) (by C-G formula based on SCr of 1.83 mg/dL (H)). Liver Function Tests: Recent Labs  Lab 08/20/20 0002  AST 36  ALT 38  ALKPHOS 176*  BILITOT 1.4*  PROT 6.7  ALBUMIN 3.7   Recent Labs  Lab 08/20/20 0002  LIPASE 30   No results for input(s): AMMONIA in the last 168 hours. Coagulation Profile: No results for input(s): INR, PROTIME in the last 168 hours. Cardiac Enzymes: No results for input(s): CKTOTAL, CKMB, CKMBINDEX, TROPONINI in the last 168 hours. BNP (last 3 results) No results for input(s): PROBNP in the last 8760 hours. HbA1C: Recent Labs    08/20/20 0930  HGBA1C 15.4*   CBG: Recent Labs  Lab 08/20/20 1627 08/20/20 2101 08/21/20 0141 08/21/20 0357 08/21/20 0741  GLUCAP 278* 242* 325* 226* 176*   Lipid Profile: Recent Labs    08/21/20 0432  CHOL 267*  HDL 65  LDLCALC 153*  TRIG 243*  CHOLHDL 4.1   Thyroid Function Tests: No results for input(s): TSH, T4TOTAL, FREET4, T3FREE, THYROIDAB in the last 72 hours. Anemia Panel: No results for input(s): VITAMINB12, FOLATE, FERRITIN, TIBC, IRON, RETICCTPCT in the last 72 hours. Sepsis Labs: Recent Labs  Lab 08/20/20 0002 08/20/20 0049 08/20/20 0230  PROCALCITON 0.15  --   --   LATICACIDVEN  --  1.4 1.5    Recent Results (from the past 240 hour(s))  Resp Panel by RT-PCR (Flu A&B, Covid) Nasopharyngeal Swab     Status: None   Collection Time: 08/20/20 12:49 AM   Specimen:  Nasopharyngeal Swab; Nasopharyngeal(NP) swabs in vial transport medium  Result Value Ref Range Status   SARS Coronavirus 2 by RT PCR NEGATIVE NEGATIVE Final    Comment: (NOTE) SARS-CoV-2 target nucleic acids are NOT DETECTED.  The SARS-CoV-2 RNA is generally detectable in upper respiratory specimens during the acute phase of infection. The lowest concentration of SARS-CoV-2 viral copies this assay can detect is 138 copies/mL. A negative result does not preclude SARS-Cov-2 infection and should not be used as the sole basis for treatment or other patient management decisions. A negative result may occur with  improper specimen collection/handling, submission of specimen other than nasopharyngeal swab, presence of viral mutation(s) within the areas targeted by this assay, and inadequate  number of viral copies(<138 copies/mL). A negative result must be combined with clinical observations, patient history, and epidemiological information. The expected result is Negative.  Fact Sheet for Patients:  EntrepreneurPulse.com.au  Fact Sheet for Healthcare Providers:  IncredibleEmployment.be  This test is no t yet approved or cleared by the [place] FDA and  has been authorized for detection and/or diagnosis of SARS-CoV-2 by FDA under an Emergency Use Authorization (EUA). This EUA will remain  in effect (meaning this test can be used) for the duration of the COVID-19 declaration under Section 564(b)(1) of the Act, 21 U.S.C.section 360bbb-3(b)(1), unless the authorization is terminated  or revoked sooner.       Influenza A by PCR NEGATIVE NEGATIVE Final   Influenza B by PCR NEGATIVE NEGATIVE Final    Comment: (NOTE) The Xpert Xpress SARS-CoV-2/FLU/RSV plus assay is intended as an aid in the diagnosis of influenza from Nasopharyngeal swab specimens and should not be used as a sole basis for treatment. Nasal washings and aspirates are unacceptable for  Xpert Xpress SARS-CoV-2/FLU/RSV testing.  Fact Sheet for Patients: EntrepreneurPulse.com.au  Fact Sheet for Healthcare Providers: IncredibleEmployment.be  This test is not yet approved or cleared by the [place] FDA and has been authorized for detection and/or diagnosis of SARS-CoV-2 by FDA under an Emergency Use Authorization (EUA). This EUA will remain in effect (meaning this test can be used) for the duration of the COVID-19 declaration under Section 564(b)(1) of the Act, 21 U.S.C. section 360bbb-3(b)(1), unless the authorization is terminated or revoked.  Performed at [facility], [address], [place], [place] [address]          Radiology Studies: CT ABDOMEN PELVIS W CONTRAST  Result Date: 08/20/2020 CLINICAL DATA:  Abdominal pain, nausea, vomiting and hyperglycemia. EXAM: CT ABDOMEN AND PELVIS WITH CONTRAST TECHNIQUE: Multidetector CT imaging of the abdomen and pelvis was performed using the standard protocol following bolus administration of intravenous contrast. CONTRAST:  21m OMNIPAQUE IOHEXOL 300 MG/ML  SOLN COMPARISON:  07/30/2020 FINDINGS: Lower chest: The lung bases are clear of an acute process. No pleural effusions or pulmonary lesions. Heart is within normal limits in size. Aortic and coronary artery calcifications are noted. Hepatobiliary: No hepatic lesions or intrahepatic biliary dilatation. The gallbladder is contracted. No common bile duct dilatation. Pancreas: No mass, inflammation or ductal dilatation. Moderate pancreatic atrophy. Spleen: Normal size. No focal lesions. Adrenals/Urinary Tract: Adrenal glands and kidneys are unremarkable. No renal lesions or evidence of pyelonephritis. No hydronephrosis. Calcification in the right renal hilum appears to be vascular. The bladder is mildly distended but no bladder mass or wall thickening. Stomach/Bowel: The stomach, duodenum, small bowel and colon are grossly normal  without oral contrast. No obvious acute inflammatory process, mass lesions obstructive findings. The terminal ileum is normal. The appendix is normal. Vascular/Lymphatic: Age advanced atherosclerotic calcifications involving the aorta and iliac arteries but no focal aneurysm or dissection. The branch vessels are patent. Branch vessel calcifications are noted. No abdominal/pelvic lymphadenopathy. Reproductive: The uterus demonstrates multiple fibroids, some of which are calcified. The ovaries are unremarkable. Parametrial vascular calcifications. Other: No pelvic mass or adenopathy. No free pelvic fluid collections. No inguinal mass or adenopathy. No abdominal wall hernia or subcutaneous lesions. Musculoskeletal: No significant bony findings. IMPRESSION: 1. No acute abdominal/pelvic findings, mass lesions or adenopathy. 2. Age advanced atherosclerotic calcifications involving the aorta and iliac arteries. 3. Moderate bladder distension. 4. Uterine fibroids. Aortic Atherosclerosis (ICD10-I70.0). Electronically Signed   By: PMarijo SanesM.D.   On:  08/20/2020 05:19   DG Chest Portable 1 View  Result Date: 08/20/2020 CLINICAL DATA:  Cough. EXAM: PORTABLE CHEST 1 VIEW COMPARISON:  July 30, 2020 FINDINGS: The lungs are mildly hyperinflated. There is no evidence of and acute infiltrate, pleural effusion or pneumothorax. The heart size and mediastinal contours are within normal limits. A stable rounded opacity is seen overlying the soft tissues of the lateral right chest wall. The visualized skeletal structures are unremarkable. IMPRESSION: Stable exam without acute cardiopulmonary disease. Electronically Signed   By: Virgina Norfolk M.D.   On: 08/20/2020 02:11        Scheduled Meds:  amLODipine  10 mg Oral Daily   aspirin EC  81 mg Oral Daily   atorvastatin  40 mg Oral QHS   enoxaparin (LOVENOX) injection  30 mg Subcutaneous Q24H   famotidine  20 mg Oral QHS   insulin aspart  0-9 Units Subcutaneous TID WC    insulin aspart  5 Units Subcutaneous TID WC   insulin glargine-yfgn  8 Units Subcutaneous BID   losartan  50 mg Oral Daily   metoprolol tartrate  25 mg Oral BID   mirtazapine  15 mg Oral QHS   nicotine  21 mg Transdermal Daily   pantoprazole  40 mg Oral BID   Continuous Infusions:  sodium chloride       LOS: 1 day    Time spent: 28 minutes    Sharen Hones, MD [facility]   To contact the attending provider between 7A-7P or the covering provider during after hours 7P-7A, please log into the web site [email or web address] and access using universal [place] password for that web site. If you do not have the password, please call the hospital operator.  08/21/2020, 10:43 AM

## 2020-08-21 NOTE — Progress Notes (Signed)
Inpatient Diabetes Program Recommendations  AACE/ADA: New Consensus Statement on Inpatient Glycemic Control   Target Ranges:  Prepandial:   less than 140 mg/dL      Peak postprandial:   less than 180 mg/dL (1-2 hours)      Critically ill patients:  140 - 180 mg/dL  Results for KALLAN, HERRIMAN (MRN [id number]) as of 08/21/2020 08:51  Ref. Range 08/21/2020 01:41 08/21/2020 03:57 08/21/2020 07:41  Glucose-Capillary Latest Ref Range: 70 - 99 mg/dL 325 (H) 226 (H) 176 (H)   Results for ARETA, THAGARD (MRN [id number]) as of 08/21/2020 08:51  Ref. Range 08/20/2020 09:34 08/20/2020 10:41 08/20/2020 12:44 08/20/2020 16:27 08/20/2020 21:01  Glucose-Capillary Latest Ref Range: 70 - 99 mg/dL 132 (H) 93 143 (H) 278 (H) 242 (H)   Review of Glycemic Control  Diabetes history: DM1 Outpatient Diabetes medications: Lantus 8 units BID, Humalog 5 units TID with meals Current orders for Inpatient glycemic control: Semglee 8 units BID, Novolog 0-6 units Q4H  Inpatient Diabetes Program Recommendations:    Insulin: Please consider ordering Novolog 4 units TID with meals for meal coverage if patient eats at least 50% of meals. If patient is eating well, please consider changing frequency of CBGs to AC&HS and Novolog to 0-6 units TID with meals and Novolog 0-5 units QHS.  Thanks, Barnie Alderman, RN, MSN, CDE Diabetes Coordinator Inpatient Diabetes Program [phone] (Team Pager from 8am to 5pm)

## 2020-08-21 NOTE — Progress Notes (Signed)
Initial Nutrition Assessment  DOCUMENTATION CODES:  Underweight, Non-severe (moderate) malnutrition in context of chronic illness  INTERVENTION:  Liberalize diet to carb modified in the context of underweight BMI and malnutrition. Encourage PO intake Snacks TID between meals MVI with minerals daily Glucerna Shake po TID, each supplement provides 220 kcal and 10 grams of protein Request new measured weight, current is stated  NUTRITION DIAGNOSIS:  Moderate Malnutrition (in the context of chronic illness) related to  (poorly managed DM) as evidenced by mild fat depletion, mild muscle depletion, severe muscle depletion.  GOAL:  Patient will meet greater than or equal to 90% of their needs  MONITOR:  PO intake, Supplement acceptance, Labs, Weight trends  REASON FOR ASSESSMENT:  Consult Assessment of nutrition requirement/status  ASSESSMENT:  65 y.o. female with a past medical history of HTN, HLD, DM, CAD, bipolar disorder,CKD, tobacco abuse, and COPD presented to ED with abdominal pain (with vomiting/diarrhea) and worsening cough.   Found to be in DKA in ED with blood glucose of '934mg'$ /dL.  Pt sleeping at the time of visit, woke easily to name being called. Several snacks noted at bedside (chips, applesauce, etc). Pt reports a decreased appetite at home, states it is sometimes a stressful environment and she doesn't feel like eating. Only eating 1-2 meals a day. Pt reports that she has lost weight, states that anytime she gains she loses more.  Thin on exam, poor intake and uncontrolled glucose levels are likely the cause of her chronic poor weight. Pt agreeable to nutrition supplements, states she does NOT like chocolate. Will also send snacks between meals to encourage intake. Pt reports she thinks she is going to be dc tomorrow.   Average Meal Intake: 7/26-7/28: 100% intake x 1 recorded meal  Nutritionally Relevant Medications: Scheduled Meds:  atorvastatin  40 mg Oral QHS    famotidine  20 mg Oral QHS   insulin aspart  0-6 Units Subcutaneous Q4H   insulin glargine-yfgn  8 Units Subcutaneous BID   lactulose  20 g Oral Once   mirtazapine  15 mg Oral QHS   pantoprazole  40 mg Oral BID   Continuous Infusions:  lactated ringers     PRN Meds: ondansetron, sucralfate  Labs Reviewed: SBG ranges from 93-325 mg/dL over the last 24 hours HgbA1c: 15.4% (7/27) Na 133 BUN 40, creatinine 1.83  NUTRITION - FOCUSED PHYSICAL EXAM: Flowsheet Row Most Recent Value  Orbital Region Mild depletion  Upper Arm Region No depletion  Thoracic and Lumbar Region Mild depletion  Buccal Region Mild depletion  Temple Region Mild depletion  Clavicle Bone Region Mild depletion  Clavicle and Acromion Bone Region Mild depletion  Scapular Bone Region Mild depletion  Dorsal Hand Moderate depletion  Patellar Region Severe depletion  Anterior Thigh Region Severe depletion  Posterior Calf Region Severe depletion  Edema (RD Assessment) None  Hair Reviewed  Eyes Reviewed  Mouth Reviewed  Skin Reviewed  Nails Reviewed   Diet Order:   Diet Order             Diet Carb Modified Fluid consistency: Thin; Room service appropriate? Yes  Diet effective now                   EDUCATION NEEDS:  No education needs have been identified at this time  Skin:  Skin Assessment: Reviewed RN Assessment  Last BM:  7/26  Height:  Ht Readings from Last 1 Encounters:  08/19/20 '5\' 2"'$  (1.575 m)    Weight:  Wt Readings from Last 1 Encounters:  08/19/20 44 kg    Ideal Body Weight:  50 kg  BMI:  Body mass index is 17.74 kg/m.  Estimated Nutritional Needs:  Kcal:  1500-1700 kcal/d Protein:  75-90 g/d Fluid:  >1529m/d  RRanell Patrick RD, LDN Clinical Dietitian Pager on [place]

## 2020-08-22 ENCOUNTER — Inpatient Hospital Stay: Payer: Medicare Other

## 2020-08-22 DIAGNOSIS — N178 Other acute kidney failure: Secondary | ICD-10-CM

## 2020-08-22 DIAGNOSIS — N1831 Chronic kidney disease, stage 3a: Secondary | ICD-10-CM | POA: Diagnosis not present

## 2020-08-22 DIAGNOSIS — E43 Unspecified severe protein-calorie malnutrition: Secondary | ICD-10-CM

## 2020-08-22 DIAGNOSIS — E1110 Type 2 diabetes mellitus with ketoacidosis without coma: Principal | ICD-10-CM

## 2020-08-22 LAB — BASIC METABOLIC PANEL
Anion gap: 9 (ref 5–15)
BUN: 47 mg/dL — ABNORMAL HIGH (ref 8–23)
CO2: 26 mmol/L (ref 22–32)
Calcium: 8.5 mg/dL — ABNORMAL LOW (ref 8.9–10.3)
Chloride: 98 mmol/L (ref 98–111)
Creatinine, Ser: 2.08 mg/dL — ABNORMAL HIGH (ref 0.44–1.00)
GFR, Estimated: 26 mL/min — ABNORMAL LOW (ref >60)
Glucose, Bld: 557 mg/dL (ref 70–99)
Potassium: 5 mmol/L (ref 3.5–5.1)
Sodium: 133 mmol/L — ABNORMAL LOW (ref 135–145)

## 2020-08-22 LAB — GLUCOSE, CAPILLARY
Glucose-Capillary: 507 mg/dL (ref 70–99)
Glucose-Capillary: 426 mg/dL — ABNORMAL HIGH (ref 70–99)
Glucose-Capillary: 64 mg/dL — ABNORMAL LOW (ref 70–99)
Glucose-Capillary: 70 mg/dL (ref 70–99)
Glucose-Capillary: 313 mg/dL — ABNORMAL HIGH (ref 70–99)
Glucose-Capillary: 351 mg/dL — ABNORMAL HIGH (ref 70–99)

## 2020-08-22 LAB — MAGNESIUM: Magnesium: 2.2 mg/dL (ref 1.7–2.4)

## 2020-08-22 MED ORDER — INSULIN GLARGINE-YFGN 100 UNIT/ML ~~LOC~~ SOLN
20.0000 [IU] | Freq: Every day | SUBCUTANEOUS | Status: DC
  Administered 2020-08-23: 09:00:00 20 [IU] via SUBCUTANEOUS
  Filled 2020-08-22: qty 0.2

## 2020-08-22 MED ORDER — INSULIN ASPART 100 UNIT/ML IJ SOLN
0.0000 [IU] | Freq: Every day | INTRAMUSCULAR | Status: DC
  Administered 2020-08-22: 5 [IU] via SUBCUTANEOUS
  Filled 2020-08-22: qty 1

## 2020-08-22 MED ORDER — INSULIN GLARGINE-YFGN 100 UNIT/ML ~~LOC~~ SOLN
20.0000 [IU] | Freq: Two times a day (BID) | SUBCUTANEOUS | Status: DC
  Administered 2020-08-22: 20 [IU] via SUBCUTANEOUS
  Filled 2020-08-22 (×2): qty 0.2

## 2020-08-22 MED ORDER — INSULIN ASPART 100 UNIT/ML IJ SOLN
2.0000 [IU] | Freq: Three times a day (TID) | INTRAMUSCULAR | Status: DC
  Administered 2020-08-22 – 2020-08-23 (×2): 2 [IU] via SUBCUTANEOUS
  Filled 2020-08-22 (×2): qty 1

## 2020-08-22 MED ORDER — INSULIN ASPART 100 UNIT/ML IJ SOLN
0.0000 [IU] | Freq: Three times a day (TID) | INTRAMUSCULAR | Status: DC
  Administered 2020-08-22: 17:00:00 4 [IU] via SUBCUTANEOUS
  Administered 2020-08-23: 6 [IU] via SUBCUTANEOUS
  Filled 2020-08-22 (×2): qty 1

## 2020-08-22 MED ORDER — INSULIN ASPART 100 UNIT/ML IJ SOLN
5.0000 [IU] | Freq: Once | INTRAMUSCULAR | Status: DC
  Filled 2020-08-22: qty 1
  Filled 2020-08-22: qty 0.05

## 2020-08-22 NOTE — Progress Notes (Addendum)
PROGRESS NOTE    Erica Mercer  [id number] DOB: 1955/09/17 DOA: 08/19/2020 PCP: [name], NP   Chief complaint.  Nausea vomiting and abdominal pain. Brief Narrative:   Erica Mercer is a 65 y.o. female with medical history significant of hypertension, hyperlipidemia, diabetes mellitus, COPD, GERD, depression, CAD, bipolar, DKA, HHS, cocaine abuse, tobacco abuse, CKD stage IIIa, who presents with nausea vomiting, diarrhea, abdominal pain and chest pain. Patient has severe hyperglycemia with glucose 934, anion gap 17, she is placed on insulin drip for diabetic ketoacidosis.  Her anion gap closed pretty quickly, she is then placed on subcu insulin.  Assessment & Plan:   Principal Problem:   DKA (diabetic ketoacidosis) ([place]) Active Problems:   Essential hypertension   HLD (hyperlipidemia)   COPD (chronic obstructive pulmonary disease) (HCC)   CAD (coronary artery disease)   Abdominal pain   GERD (gastroesophageal reflux disease)   Tobacco abuse   Depression   Elevated troponin   Type II diabetes mellitus with renal manifestations (HCC)   Nausea vomiting and diarrhea   Protein-calorie malnutrition, moderate (HCC)   CKD (chronic kidney disease), stage IIIa   Vaginal itching  Uncontrolled type 2 diabetes with DKA. Intermittent hypoglycemia. Patient glucose is very labile, he had a severe hyperglycemia yesterday, developed hypoglycemia today.  She was 8 units of Lantus yesterday.  She is given 20 units of Lantus this morning.  We will discontinued scheduled NovoLog.  Continue sliding scale insulin. Hemoglobin A1c 15.4.  #2.  Acute kidney injury on chronic kidney disease stage IIIa. Patient had a worsening renal function today, qualify for acute kidney injury today.  Obtain renal ultrasound.  Continue IV fluids. Also consult nephrology.  3.  Essential hypertension. COPD. Conditions are stable.  #4.  Severe protein calorie malnutrition. Patient appear very  malnourished, will start Glucerna.  DVT prophylaxis: Lovenox Code Status: full Family Communication: Attempted to call daughter on her cell as well as a home phone yesterday and today, not able to reach. Disposition Plan:    Status is: Inpatient  Remains inpatient appropriate because:Inpatient level of care appropriate due to severity of illness  Dispo: The patient is from: Home              Anticipated d/c is to: Home              Patient currently is not medically stable to d/c.   Difficult to place patient No        I/O last 3 completed shifts: In: 665.2 [P.O.:480; I.V.:185.2] Out: 400 [Urine:400] Total I/O In: 240 [P.O.:240] Out: -      Consultants:  None  Procedures: None  Antimicrobials: None  Subjective: Patient had a very high glucose yesterday,  Objective: Vitals:   08/21/20 2351 08/22/20 0402 08/22/20 0728 08/22/20 1118  BP: 133/82 129/82 (!) 185/97 (!) 162/96  Pulse: 82 74 73 80  Resp: '18 18 16 16  '$ Temp: 99.1 F (37.3 C) 98.4 F (36.9 C) 98.3 F (36.8 C) 98.4 F (36.9 C)  TempSrc:    Oral  SpO2: 99% 99% 100% 100%  Weight:      Height:        Intake/Output Summary (Last 24 hours) at 08/22/2020 1210 Last data filed at 08/22/2020 0956 Gross per 24 hour  Intake 665.2 ml  Output --  Net 665.2 ml   Filed Weights   08/19/20 2357  Weight: 44 kg    Examination:  General exam: Appears calm and comfortable, malnourished Respiratory system:  Clear to auscultation. Respiratory effort normal. Cardiovascular system: S1 & S2 heard, RRR. No JVD, murmurs, rubs, gallops or clicks. No pedal edema. Gastrointestinal system: Abdomen is nondistended, soft and nontender. No organomegaly or masses felt. Normal bowel sounds heard. Central nervous system: Alert and oriented. No focal neurological deficits. Extremities: Symmetric 5 x 5 power. Skin: No rashes, lesions or ulcers Psychiatry: Mood & affect appropriate.     Data Reviewed: I have personally  reviewed following labs and imaging studies  CBC: Recent Labs  Lab 08/20/20 0002  WBC 4.8  NEUTROABS 3.2  HGB 12.4  HCT 35.7*  MCV 97.5  PLT [id number]   Basic Metabolic Panel: Recent Labs  Lab 08/20/20 0002 08/20/20 0415 08/20/20 0753 08/21/20 0432 08/22/20 0432  NA 128* 134* 137 133* 133*  K 4.7 4.3 4.2 3.9 5.0  CL 84* 95* 96* 94* 98  CO2 '27 29 31 28 26  '$ GLUCOSE 934* 465* 143* 232* 557*  BUN 45* 42* 39* 40* 47*  CREATININE 1.65* 1.38* 1.19* 1.83* 2.08*  CALCIUM 9.5 9.6 9.5 9.0 8.5*  MG 2.2  --   --   --  2.2   GFR: Estimated Creatinine Clearance: 18.7 mL/min (A) (by C-G formula based on SCr of 2.08 mg/dL (H)). Liver Function Tests: Recent Labs  Lab 08/20/20 0002  AST 36  ALT 38  ALKPHOS 176*  BILITOT 1.4*  PROT 6.7  ALBUMIN 3.7   Recent Labs  Lab 08/20/20 0002  LIPASE 30   No results for input(s): AMMONIA in the last 168 hours. Coagulation Profile: No results for input(s): INR, PROTIME in the last 168 hours. Cardiac Enzymes: No results for input(s): CKTOTAL, CKMB, CKMBINDEX, TROPONINI in the last 168 hours. BNP (last 3 results) No results for input(s): PROBNP in the last 8760 hours. HbA1C: Recent Labs    08/20/20 0930  HGBA1C 15.4*   CBG: Recent Labs  Lab 08/21/20 2345 08/22/20 0404 08/22/20 0729 08/22/20 1116 08/22/20 1138  GLUCAP 425* 507* 426* 64* 70   Lipid Profile: Recent Labs    08/21/20 0432  CHOL 267*  HDL 65  LDLCALC 153*  TRIG 243*  CHOLHDL 4.1   Thyroid Function Tests: No results for input(s): TSH, T4TOTAL, FREET4, T3FREE, THYROIDAB in the last 72 hours. Anemia Panel: No results for input(s): VITAMINB12, FOLATE, FERRITIN, TIBC, IRON, RETICCTPCT in the last 72 hours. Sepsis Labs: Recent Labs  Lab 08/20/20 0002 08/20/20 0049 08/20/20 0230  PROCALCITON 0.15  --   --   LATICACIDVEN  --  1.4 1.5    Recent Results (from the past 240 hour(s))  Resp Panel by RT-PCR (Flu A&B, Covid) Nasopharyngeal Swab     Status: None    Collection Time: 08/20/20 12:49 AM   Specimen: Nasopharyngeal Swab; Nasopharyngeal(NP) swabs in vial transport medium  Result Value Ref Range Status   SARS Coronavirus 2 by RT PCR NEGATIVE NEGATIVE Final    Comment: (NOTE) SARS-CoV-2 target nucleic acids are NOT DETECTED.  The SARS-CoV-2 RNA is generally detectable in upper respiratory specimens during the acute phase of infection. The lowest concentration of SARS-CoV-2 viral copies this assay can detect is 138 copies/mL. A negative result does not preclude SARS-Cov-2 infection and should not be used as the sole basis for treatment or other patient management decisions. A negative result may occur with  improper specimen collection/handling, submission of specimen other than nasopharyngeal swab, presence of viral mutation(s) within the areas targeted by this assay, and inadequate number of viral copies(<138 copies/mL). A negative result  must be combined with clinical observations, patient history, and epidemiological information. The expected result is Negative.  Fact Sheet for Patients:  EntrepreneurPulse.com.au  Fact Sheet for Healthcare Providers:  IncredibleEmployment.be  This test is no t yet approved or cleared by the [place] FDA and  has been authorized for detection and/or diagnosis of SARS-CoV-2 by FDA under an Emergency Use Authorization (EUA). This EUA will remain  in effect (meaning this test can be used) for the duration of the COVID-19 declaration under Section 564(b)(1) of the Act, 21 U.S.C.section 360bbb-3(b)(1), unless the authorization is terminated  or revoked sooner.       Influenza A by PCR NEGATIVE NEGATIVE Final   Influenza B by PCR NEGATIVE NEGATIVE Final    Comment: (NOTE) The Xpert Xpress SARS-CoV-2/FLU/RSV plus assay is intended as an aid in the diagnosis of influenza from Nasopharyngeal swab specimens and should not be used as a sole basis for treatment.  Nasal washings and aspirates are unacceptable for Xpert Xpress SARS-CoV-2/FLU/RSV testing.  Fact Sheet for Patients: EntrepreneurPulse.com.au  Fact Sheet for Healthcare Providers: IncredibleEmployment.be  This test is not yet approved or cleared by the [place] FDA and has been authorized for detection and/or diagnosis of SARS-CoV-2 by FDA under an Emergency Use Authorization (EUA). This EUA will remain in effect (meaning this test can be used) for the duration of the COVID-19 declaration under Section 564(b)(1) of the Act, 21 U.S.C. section 360bbb-3(b)(1), unless the authorization is terminated or revoked.  Performed at [facility], [address] [place], [place] [address]          Radiology Studies: US RENAL  Result Date: 08/22/2020 CLINICAL DATA:  64 year old female with acute renal failure. EXAM: RENAL / URINARY TRACT ULTRASOUND COMPLETE COMPARISON:  CT Abdomen and Pelvis 08/20/2020. FINDINGS: Right Kidney: Renal measurements: 8.6 x 3.0 x 3.7 cm = volume: 50 mL. Renal cortex appears echogenic on image 9. No right hydronephrosis or renal mass/lesion. Left Kidney: Renal measurements: 10.3 x 5.8 x 4.6 cm = volume: 144 mL. Left renal cortical echogenicity appears better preserved. No left hydronephrosis or renal lesion. Bladder: Appears normal for degree of bladder distention. Other: None. IMPRESSION: 1. Partial right renal atrophy with suspicion of asymmetric echogenic cortex raising the possibility of chronic medical renal disease on that side. 2. Left kidney appears more normal for age.  Unremarkable bladder. Electronically Signed   By: Genevie Ann M.D.   On: 08/22/2020 11:01        Scheduled Meds:  amLODipine  10 mg Oral Daily   aspirin EC  81 mg Oral Daily   atorvastatin  40 mg Oral QHS   enoxaparin (LOVENOX) injection  30 mg Subcutaneous Q24H   famotidine  20 mg Oral QHS   feeding supplement (GLUCERNA SHAKE)  237 mL Oral  TID BM   insulin aspart  0-5 Units Subcutaneous QHS   insulin aspart  0-6 Units Subcutaneous TID WC   insulin aspart  5 Units Intravenous Once   [START ON 08/23/2020] insulin glargine-yfgn  20 Units Subcutaneous Daily   losartan  50 mg Oral Daily   metoprolol tartrate  25 mg Oral BID   mirtazapine  15 mg Oral QHS   multivitamin with minerals  1 tablet Oral Daily   nicotine  21 mg Transdermal Daily   pantoprazole  40 mg Oral BID   Continuous Infusions:  sodium chloride 75 mL/hr at 08/21/20 1502     LOS: 2 days    Time spent: 10  minutes    Sharen Hones, MD [facility]   To contact the attending provider between 7A-7P or the covering provider during after hours 7P-7A, please log into the web site [email or web address] and access using universal [place] password for that web site. If you do not have the password, please call the hospital operator.  08/22/2020, 12:10 PM

## 2020-08-22 NOTE — TOC Progression Note (Addendum)
Transition of Care [facility]) - Progression Note    Patient Details  Name: Erica Mercer MRN: [id number] Date of Birth: 1955-06-25  [facility]) CM/SW [place], LCSW Phone Number: 08/22/2020, 10:13 AM  Clinical Narrative:   Notified [place] with [facility] of discharge being delayed due to poor renal function per MD. Will continue to follow.  12:02- Was notified that Fraser Din from [facility] Nurse Case Management [phone]) has been calling with questions regarding patient's DC plan. CSW spoke to patient who gave permission for CSW to call Pat. CSW left VM for Pat.  2:50- Called Nurse Case Manager Pat again. Fraser Din reported they have been working with patient on ALF placement but have not been successful. Pat asked if Psych can eval patient to make sure she is on the right medications. Asked MD. Asked [place] with [facility] if SW can be added to patient's [facility] services, [place] agreed. Fraser Din reported she has also contacted a Care Support Team that will be helping patient when discharged.    Expected Discharge Plan: [place] Barriers to Discharge: Continued Medical Work up  Expected Discharge Plan and Services Expected Discharge Plan: [place]   Discharge Planning Services: CM Consult Post Acute Care Choice: [place] arrangements for the past 2 months: Single Family Home                 DME Arranged: N/A DME Agency: NA       HH Arranged: RN, PT, OT, Social Work [employer] Agency: [place] Date [place]: 08/21/20 Time [place]: [place] Representative spoke with at [place]: [place]   Social Determinants of Health ([place]) Interventions    Readmission Risk Interventions Readmission Risk Prevention Plan 08/21/2020  Transportation Screening Complete  Medication Review Press photographer) Complete  PCP or Specialist appointment within 3-5 days of discharge Complete  HRI or  [place] Complete  SW Recovery Care/Counseling Consult Complete  [place] Not Applicable

## 2020-08-22 NOTE — Progress Notes (Signed)
Notified [place] of pts 0400 Glucose check, 507 on the accucheck monitor. No orders for scheduled or PRN insulin. MD ordered stat BMP, Glucose critical per BMP at 557. Awaiting further orders

## 2020-08-22 NOTE — Consult Note (Signed)
[facility]  CONSULT NOTE    Date: 08/22/2020                  Patient Name:  Erica Mercer  MRN: [id number]  DOB: 10-Dec-1955  Age / Sex: 65 y.o., female         PCP: [name], NP                 Service Requesting Consult: [place]                 Reason for Consult: Acute kidney injury            History of Present Illness: Ms. Arbutus Oxenreider is a 65 y.o.  female with hypertension, HLD, diabetes, COPD, GERD, and bipolar, who was admitted to [facility] on 08/19/2020 for DKA (diabetic ketoacidosis) ([place]) [E11.10] Troponin I above reference range [R77.8] Hyperosmolar hyperglycemic state (HHS) ([place]) [E11.00, E11.65]   We have been consulted to evaluate acute kidney injury. Patient presented to ED with nausea, vomiting, diarrhea, abdominal and chest pain. She was found to have a glucose greater than 900. She states her primary care manages her glucose and does not see an endocrinologist. States she takes her insulin as ordered. Labs show Hgb A1c 15.4 on 08/20/20. Patient seen sitting at side of bed this morning. Continues to have nausea. Denies diarrhea. Tolerating small meals. Morning glucose of 426.   Medications: Outpatient medications: Medications Prior to Admission  Medication Sig Dispense Refill Last Dose   albuterol (VENTOLIN HFA) 108 (90 Base) MCG/ACT inhaler Inhale 2 puffs into the lungs every 6 (six) hours as needed for wheezing. 8 g 0 08/19/2020 at 2030   amLODipine (NORVASC) 10 MG tablet Take 1 tablet (10 mg total) by mouth daily. 30 tablet 0 08/19/2020 at 1000   aspirin EC 81 MG EC tablet Take 1 tablet (81 mg total) by mouth daily. Swallow whole. 30 tablet 0 08/19/2020 at 1000   atorvastatin (LIPITOR) 40 MG tablet Take 1 tablet (40 mg total) by mouth at bedtime. 30 tablet 0 08/19/2020 at 2000   famotidine (PEPCID) 20 MG tablet Take 1 tablet (20 mg total) by mouth at bedtime. 30 tablet 0 08/19/2020 at 2000   insulin glargine (LANTUS SOLOSTAR) 100 UNIT/ML  Solostar Pen Inject 8 Units into the skin 2 (two) times daily. 15 mL 0 08/19/2020 at 1000   insulin lispro (HUMALOG) 100 UNIT/ML KwikPen Inject 5 Units into the skin 3 (three) times daily before meals. 15 mL 0 08/19/2020 at 1300   losartan (COZAAR) 50 MG tablet Take 1 tablet (50 mg total) by mouth daily. 30 tablet 0 08/19/2020 at 1000   metoprolol tartrate (LOPRESSOR) 25 MG tablet Take 1 tablet (25 mg total) by mouth 2 (two) times daily. 60 tablet 0 08/19/2020 at 1800   mirtazapine (REMERON) 15 MG tablet Take 15 mg by mouth at bedtime.   08/19/2020 at 2000   pantoprazole (PROTONIX) 40 MG tablet Take 1 tablet (40 mg total) by mouth 2 (two) times daily. 60 tablet 0 08/19/2020 at 1800   polyethylene glycol powder (GLYCOLAX/MIRALAX) 17 GM/SCOOP powder Dissolve 1 heaping tablespoon in 8 oz of water, juice, soda, and drink once daily.   unknown at prn   Insulin Pen Needle 32G X 4 MM MISC 1 Dose by Does not apply route 4 (four) times daily -  before meals and at bedtime. 200 each 0    metoCLOPramide (REGLAN) 10 MG tablet Take 1 tablet (10 mg  total) by mouth every 6 (six) hours as needed. 30 tablet 0 unknown at prn   sucralfate (CARAFATE) 1 GM/10ML suspension Take 10 mLs (1 g total) by mouth 4 (four) times daily. 420 mL 1 unknown at prn    Current medications: Current Facility-Administered Medications  Medication Dose Route Frequency Provider Last Rate Last Admin   0.9 %  sodium chloride infusion   Intravenous Continuous Sharen Hones, MD 75 mL/hr at 08/22/20 1320 New Bag at 08/22/20 1320   acetaminophen (TYLENOL) tablet 650 mg  650 mg Oral Q6H PRN Ivor Costa, MD       albuterol (PROVENTIL) (2.5 MG/3ML) 0.083% nebulizer solution 2.5 mg  2.5 mg Nebulization Q4H PRN Ivor Costa, MD       amLODipine (NORVASC) tablet 10 mg  10 mg Oral Daily Ivor Costa, MD   10 mg at 08/22/20 0757   aspirin EC tablet 81 mg  81 mg Oral Daily Ivor Costa, MD   81 mg at 08/22/20 0757   atorvastatin (LIPITOR) tablet 40 mg  40 mg Oral QHS  Ivor Costa, MD   40 mg at 08/21/20 2048   dextromethorphan-guaiFENesin ([place] DM) 30-600 MG per 12 hr tablet 1 tablet  1 tablet Oral BID PRN Ivor Costa, MD       dextrose 50 % solution 0-50 mL  0-50 mL Intravenous PRN Lucrezia Starch, MD       enoxaparin (LOVENOX) injection 30 mg  30 mg Subcutaneous Q24H Hallaji, Sheema M, RPH   30 mg at 08/21/20 2050   famotidine (PEPCID) tablet 20 mg  20 mg Oral QHS Ivor Costa, MD   20 mg at 08/21/20 2047   feeding supplement (GLUCERNA SHAKE) (GLUCERNA SHAKE) liquid 237 mL  237 mL Oral TID BM Sharen Hones, MD   237 mL at 08/22/20 1321   hydrALAZINE (APRESOLINE) injection 5 mg  5 mg Intravenous Q2H PRN Ivor Costa, MD   5 mg at 08/21/20 1703   insulin aspart (novoLOG) injection 0-5 Units  0-5 Units Subcutaneous QHS Sharen Hones, MD       insulin aspart (novoLOG) injection 0-6 Units  0-6 Units Subcutaneous TID WC Sharen Hones, MD       insulin aspart (novoLOG) injection 5 Units  5 Units Intravenous Once Athena Masse, MD       [START ON 08/23/2020] insulin glargine-yfgn (SEMGLEE) injection 20 Units  20 Units Subcutaneous Daily Sharen Hones, MD       ketorolac (TORADOL) 30 MG/ML injection 15 mg  15 mg Intravenous Q8H PRN Ivor Costa, MD   15 mg at 08/22/20 0936   losartan (COZAAR) tablet 50 mg  50 mg Oral Daily Ivor Costa, MD   50 mg at 08/22/20 0757   metoprolol tartrate (LOPRESSOR) tablet 25 mg  25 mg Oral BID Ivor Costa, MD   25 mg at 08/22/20 0757   mirtazapine (REMERON) tablet 15 mg  15 mg Oral QHS Ivor Costa, MD   15 mg at 08/21/20 2049   multivitamin with minerals tablet 1 tablet  1 tablet Oral Daily Sharen Hones, MD   1 tablet at 08/22/20 0757   nicotine (NICODERM CQ - dosed in mg/24 hours) patch 21 mg  21 mg Transdermal Daily Ivor Costa, MD   21 mg at 08/22/20 0757   ondansetron (ZOFRAN) injection 4 mg  4 mg Intravenous Q8H PRN Ivor Costa, MD       pantoprazole (PROTONIX) EC tablet 40 mg  40 mg Oral BID Ivor Costa, MD  40 mg at 08/22/20 0757    sucralfate (CARAFATE) 1 GM/10ML suspension 1 g  1 g Oral QID PRN Ivor Costa, MD          Allergies: No Known Allergies    Past Medical History: Past Medical History:  Diagnosis Date   Bipolar 1 disorder ([place])    CAD (coronary artery disease)    Diabetes mellitus without complication ([place])    HLD (hyperlipidemia)    HTN (hypertension)      Past Surgical History: Past Surgical History:  Procedure Laterality Date   COLONOSCOPY     ESOPHAGOGASTRODUODENOSCOPY       Family History: Family History  Problem Relation Age of Onset   Heart disease Mother    Prostate cancer Father      Social History: Social History   Socioeconomic History   Marital status: Single    Spouse name: Not on file   Number of children: Not on file   Years of education: Not on file   Highest education level: Not on file  Occupational History   Not on file  Tobacco Use   Smoking status: Every Day   Smokeless tobacco: Never  Vaping Use   Vaping Use: Never used  Substance and Sexual Activity   Alcohol use: Not Currently   Drug use: Yes    Frequency: 3.0 times per week    Types: Marijuana   Sexual activity: Not Currently  Other Topics Concern   Not on file  Social History Narrative   Not on file   Social Determinants of Health   Financial Resource Strain: Not on file  Food Insecurity: Not on file  Transportation Needs: Not on file  Physical Activity: Not on file  Stress: Not on file  Social Connections: Not on file  Intimate Partner Violence: Not on file     Review of Systems: Review of Systems  Cardiovascular:  Positive for chest pain.  Gastrointestinal:  Positive for abdominal pain, diarrhea, nausea and vomiting.  All other systems reviewed and are negative.  Vital Signs: Blood pressure (!) 162/96, pulse 80, temperature 98.4 F (36.9 C), temperature source Oral, resp. rate 16, height '5\' 2"'$  (1.575 m), weight 44 kg, SpO2 100 %.  Weight trends: Filed Weights   08/19/20  2357  Weight: 44 kg    Physical Exam: General: NAD, sitting at bedside  Head: Normocephalic, atraumatic. Moist oral mucosal membranes  Eyes: Anicteric  Neck: Supple, trachea midline  Lungs:  Clear to auscultation, normal effort  Heart: Regular rate and rhythm  Abdomen:  Soft, nontender   Extremities:  no peripheral edema.  Neurologic: Nonfocal, moving all four extremities  Skin: No lesions        Lab results: Basic Metabolic Panel: Recent Labs  Lab 08/20/20 0002 08/20/20 0415 08/20/20 0753 08/21/20 0432 08/22/20 0432  NA 128*   < > 137 133* 133*  K 4.7   < > 4.2 3.9 5.0  CL 84*   < > 96* 94* 98  CO2 27   < > '31 28 26  '$ GLUCOSE 934*   < > 143* 232* 557*  BUN 45*   < > 39* 40* 47*  CREATININE 1.65*   < > 1.19* 1.83* 2.08*  CALCIUM 9.5   < > 9.5 9.0 8.5*  MG 2.2  --   --   --  2.2   < > = values in this interval not displayed.    Liver Function Tests: Recent Labs  Lab 08/20/20 0002  AST 36  ALT 38  ALKPHOS 176*  BILITOT 1.4*  PROT 6.7  ALBUMIN 3.7   Recent Labs  Lab 08/20/20 0002  LIPASE 30   No results for input(s): AMMONIA in the last 168 hours.  CBC: Recent Labs  Lab 08/20/20 0002  WBC 4.8  NEUTROABS 3.2  HGB 12.4  HCT 35.7*  MCV 97.5  PLT 326    Cardiac Enzymes: No results for input(s): CKTOTAL, CKMB, CKMBINDEX, TROPONINI in the last 168 hours.  BNP: Invalid input(s): POCBNP  CBG: Recent Labs  Lab 08/21/20 2345 08/22/20 0404 08/22/20 0729 08/22/20 1116 08/22/20 1138  GLUCAP 425* 507* 426* 64* 70    Microbiology: Results for orders placed or performed during the hospital encounter of 08/19/20  Resp Panel by RT-PCR (Flu A&B, Covid) Nasopharyngeal Swab     Status: None   Collection Time: 08/20/20 12:49 AM   Specimen: Nasopharyngeal Swab; Nasopharyngeal(NP) swabs in vial transport medium  Result Value Ref Range Status   SARS Coronavirus 2 by RT PCR NEGATIVE NEGATIVE Final    Comment: (NOTE) SARS-CoV-2 target nucleic acids are  NOT DETECTED.  The SARS-CoV-2 RNA is generally detectable in upper respiratory specimens during the acute phase of infection. The lowest concentration of SARS-CoV-2 viral copies this assay can detect is 138 copies/mL. A negative result does not preclude SARS-Cov-2 infection and should not be used as the sole basis for treatment or other patient management decisions. A negative result may occur with  improper specimen collection/handling, submission of specimen other than nasopharyngeal swab, presence of viral mutation(s) within the areas targeted by this assay, and inadequate number of viral copies(<138 copies/mL). A negative result must be combined with clinical observations, patient history, and epidemiological information. The expected result is Negative.  Fact Sheet for Patients:  EntrepreneurPulse.com.au  Fact Sheet for Healthcare Providers:  IncredibleEmployment.be  This test is no t yet approved or cleared by the [place] FDA and  has been authorized for detection and/or diagnosis of SARS-CoV-2 by FDA under an Emergency Use Authorization (EUA). This EUA will remain  in effect (meaning this test can be used) for the duration of the COVID-19 declaration under Section 564(b)(1) of the Act, 21 U.S.C.section 360bbb-3(b)(1), unless the authorization is terminated  or revoked sooner.       Influenza A by PCR NEGATIVE NEGATIVE Final   Influenza B by PCR NEGATIVE NEGATIVE Final    Comment: (NOTE) The Xpert Xpress SARS-CoV-2/FLU/RSV plus assay is intended as an aid in the diagnosis of influenza from Nasopharyngeal swab specimens and should not be used as a sole basis for treatment. Nasal washings and aspirates are unacceptable for Xpert Xpress SARS-CoV-2/FLU/RSV testing.  Fact Sheet for Patients: EntrepreneurPulse.com.au  Fact Sheet for Healthcare Providers: IncredibleEmployment.be  This test is not  yet approved or cleared by the [place] FDA and has been authorized for detection and/or diagnosis of SARS-CoV-2 by FDA under an Emergency Use Authorization (EUA). This EUA will remain in effect (meaning this test can be used) for the duration of the COVID-19 declaration under Section 564(b)(1) of the Act, 21 U.S.C. section 360bbb-3(b)(1), unless the authorization is terminated or revoked.  Performed at [facility], [address] [place],  [address]     Coagulation Studies: No results for input(s): LABPROT, INR in the last 72 hours.  Urinalysis: Recent Labs    08/20/20 0049  COLORURINE STRAW*  LABSPEC 1.021  PHURINE 6.0  GLUCOSEU >=500*  HGBUR NEGATIVE  BILIRUBINUR NEGATIVE  KETONESUR 20*  PROTEINUR 100*  NITRITE NEGATIVE  LEUKOCYTESUR NEGATIVE      Imaging: US RENAL  Result Date: 08/22/2020 CLINICAL DATA:  65 year old female with acute renal failure. EXAM: RENAL / URINARY TRACT ULTRASOUND COMPLETE COMPARISON:  CT Abdomen and Pelvis 08/20/2020. FINDINGS: Right Kidney: Renal measurements: 8.6 x 3.0 x 3.7 cm = volume: 50 mL. Renal cortex appears echogenic on image 9. No right hydronephrosis or renal mass/lesion. Left Kidney: Renal measurements: 10.3 x 5.8 x 4.6 cm = volume: 144 mL. Left renal cortical echogenicity appears better preserved. No left hydronephrosis or renal lesion. Bladder: Appears normal for degree of bladder distention. Other: None. IMPRESSION: 1. Partial right renal atrophy with suspicion of asymmetric echogenic cortex raising the possibility of chronic medical renal disease on that side. 2. Left kidney appears more normal for age.  Unremarkable bladder. Electronically Signed   By: Genevie Ann M.D.   On: 08/22/2020 11:01     Assessment & Plan: Ms. Tanner Rothman is a 65 y.o.  female with hypertension, HLD, diabetes, COPD, GERD, and bipolar, who was admitted to [facility] on 08/19/2020 for DKA (diabetic ketoacidosis) ([place]) [E11.10] Troponin I  above reference range [R77.8] Hyperosmolar hyperglycemic state (HHS) ([place]) [E11.00, E11.65]   Acute Kidney Injury on chronic kidney disease stage 3A with baseline creatinine 1.4 and GFR of 42 on 04/17/20.  Acute kidney injury likely secondary to recent dehydration caused by nausea, vomiting, and diarrhea vs contrast induced nephropathy Chronic kidney disease is secondary to uncontrolled diabetes Renal ultrasound shows right kidney atrophy, negative for obstruction IV contrast exposure on 08/20/20 No indication for dialysis at this time  Continue IVF Avoid nephrotoxic agents and therapies Will continue to monitor  2. Diabetes mellitus type II with chronic kidney disease  insulin dependent. Home regimen includes Humalog and Lantus. Most recent hemoglobin A1c is 15.4 on 08/20/20.  Admission glucose of greater than 900. Placed on insulin drip and transitioned to subQ insulin management. Educated patient on diabetes management and its effects on the kidneys. Recommend referral to Endocrinology. Will follow up with our office at discharge to monitor kidney function.  Primary team to manage SSI.   3. Anemia of chronic kidney disease Lab Results  Component Value Date   HGB 12.4 08/20/2020  Hgb at goal     LOS: 2   7/29/20222:17 PM

## 2020-08-22 NOTE — Progress Notes (Signed)
Inpatient Diabetes Program Recommendations  AACE/ADA: New Consensus Statement on Inpatient Glycemic Control (2015)  Target Ranges:  Prepandial:   less than 140 mg/dL      Peak postprandial:   less than 180 mg/dL (1-2 hours)      Critically ill patients:  140 - 180 mg/dL   Lab Results  Component Value Date   GLUCAP 70 08/22/2020   HGBA1C 15.4 (H) 08/20/2020    Review of Glycemic Control Results for NEKEIA, SHELER (MRN [id number]) as of 08/22/2020 11:54  Ref. Range 08/22/2020 07:29 08/22/2020 11:16 08/22/2020 11:38  Glucose-Capillary Latest Ref Range: 70 - 99 mg/dL 426 (H) 64 (L) 70  Diabetes history: DM1 Outpatient Diabetes medications: Lantus 8 units BID, Humalog 5 units TID with meals Current orders for Inpatient glycemic control: Semglee 20 units daily, Novolog 0-9 units tid with meals, Novolog 5 units tid with meals  Inpatient Diabetes Program Recommendations:    Patient is sensitive to insulin.  Note blood sugar>400 mg/dL.  Not clear why? Although note that patient received supplement at 2043 and did not get any insulin coverage last night.   Note that Lantus increased to 20 units daily.  May consider reducing Novolog correction to 0-6 units tid with meals and add Novolog coverage at HS.  Blood sugars are very labile.  When patient receives nutritional supplement, may consider adding 2 units of Novolog to cover CHO?  Will follow.   Thanks,  Adah Perl, RN, BC-ADM Inpatient Diabetes Coordinator Pager [phone] (8a-5p)

## 2020-08-22 NOTE — Care Management Important Message (Signed)
Important Message  Patient Details  Name: Erica Mercer MRN: [id number] Date of Birth: [birth year]   Medicare Important Message Given:  Yes     [name] 08/22/2020, 3:44 PM

## 2020-08-23 DIAGNOSIS — E1110 Type 2 diabetes mellitus with ketoacidosis without coma: Secondary | ICD-10-CM | POA: Diagnosis not present

## 2020-08-23 DIAGNOSIS — E1122 Type 2 diabetes mellitus with diabetic chronic kidney disease: Secondary | ICD-10-CM

## 2020-08-23 DIAGNOSIS — N1832 Chronic kidney disease, stage 3b: Secondary | ICD-10-CM | POA: Diagnosis not present

## 2020-08-23 DIAGNOSIS — Z794 Long term (current) use of insulin: Secondary | ICD-10-CM

## 2020-08-23 DIAGNOSIS — E43 Unspecified severe protein-calorie malnutrition: Secondary | ICD-10-CM | POA: Diagnosis not present

## 2020-08-23 LAB — GLUCOSE, CAPILLARY
Glucose-Capillary: 207 mg/dL — ABNORMAL HIGH (ref 70–99)
Glucose-Capillary: 291 mg/dL — ABNORMAL HIGH (ref 70–99)
Glucose-Capillary: 414 mg/dL — ABNORMAL HIGH (ref 70–99)

## 2020-08-23 LAB — CBC WITH DIFFERENTIAL/PLATELET
Abs Immature Granulocytes: 0.05 10*3/uL (ref 0.00–0.07)
Basophils Absolute: 0 10*3/uL (ref 0.0–0.1)
Basophils Relative: 1 %
Eosinophils Absolute: 0.1 10*3/uL (ref 0.0–0.5)
Eosinophils Relative: 1 %
HCT: 29.4 % — ABNORMAL LOW (ref 36.0–46.0)
Hemoglobin: 9.9 g/dL — ABNORMAL LOW (ref 12.0–15.0)
Immature Granulocytes: 1 %
Lymphocytes Relative: 42 %
Lymphs Abs: 2.8 10*3/uL (ref 0.7–4.0)
MCH: 33.6 pg (ref 26.0–34.0)
MCHC: 33.7 g/dL (ref 30.0–36.0)
MCV: 99.7 fL (ref 80.0–100.0)
Monocytes Absolute: 0.4 10*3/uL (ref 0.1–1.0)
Monocytes Relative: 6 %
Neutro Abs: 3.3 10*3/uL (ref 1.7–7.7)
Neutrophils Relative %: 49 %
Platelets: 257 10*3/uL (ref 150–400)
RBC: 2.95 MIL/uL — ABNORMAL LOW (ref 3.87–5.11)
RDW: 14.3 % (ref 11.5–15.5)
WBC: 6.6 10*3/uL (ref 4.0–10.5)
nRBC: 0 % (ref 0.0–0.2)

## 2020-08-23 LAB — BASIC METABOLIC PANEL
Anion gap: 5 (ref 5–15)
BUN: 51 mg/dL — ABNORMAL HIGH (ref 8–23)
CO2: 25 mmol/L (ref 22–32)
Calcium: 8.8 mg/dL — ABNORMAL LOW (ref 8.9–10.3)
Chloride: 107 mmol/L (ref 98–111)
Creatinine, Ser: 1.64 mg/dL — ABNORMAL HIGH (ref 0.44–1.00)
GFR, Estimated: 35 mL/min — ABNORMAL LOW (ref >60)
Glucose, Bld: 327 mg/dL — ABNORMAL HIGH (ref 70–99)
Potassium: 4.9 mmol/L (ref 3.5–5.1)
Sodium: 137 mmol/L (ref 135–145)

## 2020-08-23 MED ORDER — LANTUS SOLOSTAR 100 UNIT/ML ~~LOC~~ SOPN: 20.0000 [IU] | PEN_INJECTOR | Freq: Every day | SUBCUTANEOUS | 0 refills | Status: DC

## 2020-08-23 MED ORDER — CARVEDILOL 25 MG PO TABS: 25.0000 mg | ORAL_TABLET | Freq: Two times a day (BID) | ORAL | 0 refills | Status: DC

## 2020-08-23 MED ORDER — INSULIN LISPRO (1 UNIT DIAL) 100 UNIT/ML (KWIKPEN): 3.0000 [IU] | PEN_INJECTOR | Freq: Three times a day (TID) | SUBCUTANEOUS | 0 refills | Status: AC

## 2020-08-23 MED ORDER — CARVEDILOL PHOSPHATE ER 10 MG PO CP24
40.0000 mg | ORAL_CAPSULE | Freq: Every day | ORAL | Status: DC
  Filled 2020-08-23: qty 4

## 2020-08-23 MED ORDER — HYDRALAZINE HCL 50 MG PO TABS: 25.0000 mg | ORAL_TABLET | Freq: Three times a day (TID) | ORAL | Status: DC

## 2020-08-23 MED ORDER — HYDRALAZINE HCL 25 MG PO TABS: 25.0000 mg | ORAL_TABLET | Freq: Three times a day (TID) | ORAL | 0 refills | Status: DC

## 2020-08-23 NOTE — TOC Transition Note (Addendum)
Transition of Care [facility]) - CM/SW Discharge Note   Patient Details  Name: Erica Mercer MRN: [id number] Date of Birth: Jun 07, 1955  [facility] CM/SW Contact:  Magnus Ivan, LCSW Phone Number: 08/23/2020, 9:42 AM   Clinical Narrative:     Patient scheduled to discharge home today. Cab voucher provided. Notified [place] with [place]. Asked MD for [facility] orders. Left VM for Nurse Case Manager Pat with update. No additional TOC needs identified.    Final next level of care: [place] Barriers to Discharge: Barriers Resolved   Patient Goals and CMS Choice Patient states their goals for this hospitalization and ongoing recovery are:: home with home health CMS Medicare.gov Compare Post Acute Care list provided to:: Patient Choice offered to / list presented to : Patient  Discharge Placement                Patient to be transferred to facility by: taxi voucher provided Name of family member notified: patient Patient and family notified of of transfer: 08/23/20  Discharge Plan and Services   Discharge Planning Services: CM Consult Post Acute Care Choice: Home Health          DME Arranged: N/A DME Agency: NA       HH Arranged: RN, PT, OT, Social Work [employer] Agency: [place] Date [place] Agency Contacted: 08/23/20 Time [place]: 1458 Representative spoke with at [place]: [place]  Social Determinants of Health ([place]) Interventions     Readmission Risk Interventions Readmission Risk Prevention Plan 08/21/2020  Transportation Screening Complete  Medication Review Press photographer) Complete  PCP or Specialist appointment within 3-5 days of discharge Complete  HRI or [place] Complete  SW Recovery Care/Counseling Consult Complete  [place] Not Applicable

## 2020-08-23 NOTE — Discharge Summary (Signed)
Physician Discharge Summary  Patient ID: Erica Mercer MRN: [id number] DOB/AGE: [birth year] 65 y.o.  Admit date: 08/19/2020 Discharge date: 08/23/2020  Admission Diagnoses:  Discharge Diagnoses:  Principal Problem:   DKA (diabetic ketoacidosis) ([place]) Active Problems:   Severe protein-calorie malnutrition ([place])   Essential hypertension   HLD (hyperlipidemia)   Acute renal failure superimposed on stage 3a chronic kidney disease (HCC)   COPD (chronic obstructive pulmonary disease) (HCC)   CAD (coronary artery disease)   Abdominal pain   GERD (gastroesophageal reflux disease)   Tobacco abuse   Depression   Elevated troponin   Type II diabetes mellitus with renal manifestations (HCC)   Nausea vomiting and diarrhea   Protein-calorie malnutrition, moderate (HCC)   CKD (chronic kidney disease), stage IIIa   Vaginal itching   Discharged Condition: good  Hospital Course:  Erica Mercer is a 65 y.o. female with medical history significant of hypertension, hyperlipidemia, diabetes mellitus, COPD, GERD, depression, CAD, bipolar, DKA, HHS, cocaine abuse, tobacco abuse, CKD stage IIIa, who presents with nausea vomiting, diarrhea, abdominal pain and chest pain. Patient has severe hyperglycemia with glucose 934, anion gap 17, she is placed on insulin drip for diabetic ketoacidosis.  Her anion gap closed pretty quickly, she is then placed on subcu insulin.  Uncontrolled type 2 diabetes with DKA. Intermittent hypoglycemia. Patient glucose is very labile, Hemoglobin A1c 15.4. Insulin dose adjusted to Lantus 20 units daily, NovoLog 3 units 3 times a day.  At this point, glucose may not be ideal, need adjustment in dose as outpatient.  Please have close follow-up with PCP, slowly titrate up insulin dose to avoid hypoglycemia. I had a long discussion with patient, she need to be compliant for her insulin.  Otherwise, her life will be in danger.   #2.  Acute kidney injury on chronic kidney  disease stage IIIa. Patient had a worsening renal function 7/29, met the criteria for diagnosis of acute kidney injury.  Ultrasound showed some renal atrophy.  She was given fluids, renal function has back to baseline.  3.  Essential hypertension. Patient is seen by nephrology, blood pressure medicine has been adjusted, patient had a history of cocaine abuse, metoprolol was discontinued, Coreg was started.  Losartan was also discontinued due to acute kidney injury.  Patient is started on hydralazine.  COPD. Conditions are stable.    Severe protein calorie malnutrition. Encourage better p.o. intake.    Consults: nephrology  Significant Diagnostic Studies:  Treatments: Insulin  Discharge Exam: Blood pressure (!) 173/103, pulse 88, temperature 98.6 F (37 C), resp. rate 20, height 5' 2" (1.575 m), weight 44 kg, SpO2 100 %. General appearance: alert and cooperative Resp: clear to auscultation bilaterally Cardio: regular rate and rhythm, S1, S2 normal, no murmur, click, rub or gallop GI: soft, non-tender; bowel sounds normal; no masses,  no organomegaly Extremities: extremities normal, atraumatic, no cyanosis or edema  Disposition: Discharge disposition: 01-Home or Self Care       Discharge Instructions     Diet general   Complete by: As directed    Diabetes carb controlled diet   Increase activity slowly   Complete by: As directed       Allergies as of 08/23/2020   No Known Allergies      Medication List     STOP taking these medications    losartan 50 MG tablet Commonly known as: COZAAR   metoprolol tartrate 25 MG tablet Commonly known as: LOPRESSOR       TAKE these medications  albuterol 108 (90 Base) MCG/ACT inhaler Commonly known as: VENTOLIN HFA Inhale 2 puffs into the lungs every 6 (six) hours as needed for wheezing.   amLODipine 10 MG tablet Commonly known as: NORVASC Take 1 tablet (10 mg total) by mouth daily.   aspirin 81 MG EC  tablet Take 1 tablet (81 mg total) by mouth daily. Swallow whole.   atorvastatin 40 MG tablet Commonly known as: LIPITOR Take 1 tablet (40 mg total) by mouth at bedtime.   carvedilol 25 MG tablet Commonly known as: COREG Take 1 tablet (25 mg total) by mouth 2 (two) times daily with a meal.   famotidine 20 MG tablet Commonly known as: PEPCID Take 1 tablet (20 mg total) by mouth at bedtime.   hydrALAZINE 25 MG tablet Commonly known as: APRESOLINE Take 1 tablet (25 mg total) by mouth every 8 (eight) hours.   insulin lispro 100 UNIT/ML KwikPen Commonly known as: HUMALOG Inject 3 Units into the skin 3 (three) times daily before meals. What changed: how much to take   Insulin Pen Needle 32G X 4 MM Misc 1 Dose by Does not apply route 4 (four) times daily -  before meals and at bedtime.   Lantus SoloStar 100 UNIT/ML Solostar Pen Generic drug: insulin glargine Inject 20 Units into the skin daily. What changed:  how much to take when to take this   metoCLOPramide 10 MG tablet Commonly known as: REGLAN Take 1 tablet (10 mg total) by mouth every 6 (six) hours as needed.   mirtazapine 15 MG tablet Commonly known as: REMERON Take 15 mg by mouth at bedtime.   pantoprazole 40 MG tablet Commonly known as: PROTONIX Take 1 tablet (40 mg total) by mouth 2 (two) times daily.   polyethylene glycol powder 17 GM/SCOOP powder Commonly known as: GLYCOLAX/MIRALAX Dissolve 1 heaping tablespoon in 8 oz of water, juice, soda, and drink once daily.   sucralfate 1 GM/10ML suspension Commonly known as: Carafate Take 10 mLs (1 g total) by mouth 4 (four) times daily.        Follow-up Information     Ferry, Evan Mark, NP Follow up in 1 week(s).   Specialty: Family Medicine Contact information: [address] [facility] [place] [address] [phone]         Lateef, Munsoor, MD Follow up in 2 week(s).   Specialty: Nephrology Contact information: [address] [place] [address] [phone]                34 minutes Signed:   08/23/2020, 9:36 AM   

## 2020-08-23 NOTE — Progress Notes (Signed)
Erica Mercer  MRN: [id number]  DOB/AGE: 09/08/1955 65 y.o.  Primary Care Physician:Ferry, Blanchard Kelch, NP  Admit date: 08/19/2020  Chief Complaint:  Chief Complaint  Patient presents with   Abdominal Pain    S-Pt presented on  08/19/2020 with  Chief Complaint  Patient presents with   Abdominal Pain  . Patient seen today on first floor.  Patient resting comfortably on the bed Patient offers no new specific physical complaints  Medications  amLODipine  10 mg Oral Daily   aspirin EC  81 mg Oral Daily   atorvastatin  40 mg Oral QHS   enoxaparin (LOVENOX) injection  30 mg Subcutaneous Q24H   famotidine  20 mg Oral QHS   feeding supplement (GLUCERNA SHAKE)  237 mL Oral TID BM   insulin aspart  0-5 Units Subcutaneous QHS   insulin aspart  0-6 Units Subcutaneous TID WC   insulin aspart  2 Units Subcutaneous TID WC   insulin aspart  5 Units Intravenous Once   insulin glargine-yfgn  20 Units Subcutaneous Daily   losartan  50 mg Oral Daily   metoprolol tartrate  25 mg Oral BID   mirtazapine  15 mg Oral QHS   multivitamin with minerals  1 tablet Oral Daily   nicotine  21 mg Transdermal Daily   pantoprazole  40 mg Oral BID         [id number] from the symptoms mentioned above,there are no other symptoms referable to all systems reviewed.  Physical Exam: Vital signs in last 24 hours: Temp:  [98.4 F (36.9 C)-98.8 F (37.1 C)] 98.6 F (37 C) (07/30 0621) Pulse Rate:  [75-89] 75 (07/30 0621) Resp:  [16-18] 16 (07/30 0621) BP: (123-173)/(83-96) 164/88 (07/30 0621) SpO2:  [99 %-100 %] 100 % (07/30 0621) Weight change:  Last BM Date: 08/19/20  Intake/Output from previous day: 07/29 0701 - 07/30 0700 In: 240 [P.O.:240] Out: -  No intake/output data recorded.   Physical Exam:  General- pt is awake,alert, oriented to time place and person  Resp- No acute REsp distress, CTA B/L NO Rhonchi  CVS- S1S2 regular in rate and rhythm  GIT- BS+, soft, Non tender , Non  distended  EXT- No LE Edema,  No Cyanosis   Lab Results:  CBC  Recent Labs    08/23/20 0650  WBC 6.6  HGB 9.9*  HCT 29.4*  PLT 257    BMET  Recent Labs    08/22/20 0432 08/23/20 0650  NA 133* 137  K 5.0 4.9  CL 98 107  CO2 26 25  GLUCOSE 557* 327*  BUN 47* 51*  CREATININE 2.08* 1.64*  CALCIUM 8.5* 8.8*      Most recent Creatinine trend  Lab Results  Component Value Date   CREATININE 1.64 (H) 08/23/2020   CREATININE 2.08 (H) 08/22/2020   CREATININE 1.83 (H) 08/21/2020      MICRO   Recent Results (from the past 240 hour(s))  Resp Panel by RT-PCR (Flu A&B, Covid) Nasopharyngeal Swab     Status: None   Collection Time: 08/20/20 12:49 AM   Specimen: Nasopharyngeal Swab; Nasopharyngeal(NP) swabs in vial transport medium  Result Value Ref Range Status   SARS Coronavirus 2 by RT PCR NEGATIVE NEGATIVE Final    Comment: (NOTE) SARS-CoV-2 target nucleic acids are NOT DETECTED.  The SARS-CoV-2 RNA is generally detectable in upper respiratory specimens during the acute phase of infection. The lowest concentration of SARS-CoV-2 viral copies this assay can detect is 138 copies/mL. A negative  result does not preclude SARS-Cov-2 infection and should not be used as the sole basis for treatment or other patient management decisions. A negative result may occur with  improper specimen collection/handling, submission of specimen other than nasopharyngeal swab, presence of viral mutation(s) within the areas targeted by this assay, and inadequate number of viral copies(<138 copies/mL). A negative result must be combined with clinical observations, patient history, and epidemiological information. The expected result is Negative.  Fact Sheet for Patients:  EntrepreneurPulse.com.au  Fact Sheet for Healthcare Providers:  IncredibleEmployment.be  This test is no t yet approved or cleared by the [place] FDA and  has been  authorized for detection and/or diagnosis of SARS-CoV-2 by FDA under an Emergency Use Authorization (EUA). This EUA will remain  in effect (meaning this test can be used) for the duration of the COVID-19 declaration under Section 564(b)(1) of the Act, 21 U.S.C.section 360bbb-3(b)(1), unless the authorization is terminated  or revoked sooner.       Influenza A by PCR NEGATIVE NEGATIVE Final   Influenza B by PCR NEGATIVE NEGATIVE Final    Comment: (NOTE) The Xpert Xpress SARS-CoV-2/FLU/RSV plus assay is intended as an aid in the diagnosis of influenza from Nasopharyngeal swab specimens and should not be used as a sole basis for treatment. Nasal washings and aspirates are unacceptable for Xpert Xpress SARS-CoV-2/FLU/RSV testing.  Fact Sheet for Patients: EntrepreneurPulse.com.au  Fact Sheet for Healthcare Providers: IncredibleEmployment.be  This test is not yet approved or cleared by the [place] FDA and has been authorized for detection and/or diagnosis of SARS-CoV-2 by FDA under an Emergency Use Authorization (EUA). This EUA will remain in effect (meaning this test can be used) for the duration of the COVID-19 declaration under Section 564(b)(1) of the Act, 21 U.S.C. section 360bbb-3(b)(1), unless the authorization is terminated or revoked.  Performed at [facility], [address] [place], [place] [address]          Impression:  Ms. Erica Mercer is a 65 y.o.  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 Patient had AKI during her admission in January, then in early July and now again in late July   AKI now better . Patient creatinine has come down from 2.0 to now 1.6 Patient is on losartan and also has IV Toradol ordered Will DC both medications  2)HTN Patient blood pressure is on the higher side Patient is on metoprolol 25 mg p.o. twice daily Losartan 50 mg p.o. daily Amlodipine 10 mg daily   Patient with history of cocaine abuse Patient currently tox screen is positive for cannabinoids Patient is on beta-blockers Will suggest to change it from beta-blockers to alpha plus beta-blockers  As patient is recovering from AKI we will hold losartan We will start patient on hydralazine  3)Anemia of chronic disease  CBC Latest Ref Rng & Units 08/23/2020 08/20/2020 08/14/2020  WBC 4.0 - 10.5 K/uL 6.6 4.8 5.0  Hemoglobin 12.0 - 15.0 g/dL 9.9(L) 12.4 10.7(L)  Hematocrit 36.0 - 46.0 % 29.4(L) 35.7(L) 31.5(L)  Platelets 150 - 400 K/uL 257 326 316       HGb at goal (9--11)   4) Secondary hyperparathyroidism -CKD Mineral-Bone Disorder    Lab Results  Component Value Date   CALCIUM 8.8 (L) 08/23/2020   PHOS 2.6 04/30/2020    Secondary Hyperparathyroidism w/u as outpt  Phosphorus at goal.   5) uncontrolled type 2 diabetes mellitus with DKA Primary team is following  6) Electrolytes   BMP Latest Ref Rng & Units 08/23/2020 08/22/2020 08/21/2020  Glucose 70 - 99 mg/dL 327(H) 557(HH) 232(H)  BUN 8 - 23 mg/dL 51(H) 47(H) 40(H)  Creatinine 0.44 - 1.00 mg/dL 1.64(H) 2.08(H) 1.83(H)  Sodium 135 - 145 mmol/L 137 133(L) 133(L)  Potassium 3.5 - 5.1 mmol/L 4.9 5.0 3.9  Chloride 98 - 111 mmol/L 107 98 94(L)  CO2 22 - 32 mmol/L '25 26 28  '$ Calcium 8.9 - 10.3 mg/dL 8.8(L) 8.5(L) 9.0     Sodium Hyponatremia Secondary to uncontrolled  hyperglycemia Now better   Potassium Normokalemic    7)Acid base    Co2 at goal     Plan:   Because of uncontrolled hypertension, acute kidney injury and history of cocaine abuse.  Will DC metoprolol  Will DC losartan  We will start Coreg-We will use long-acting Coreg in half for to help with the patient adherence  We will start hydralazine  We will follow patient Chem-7     Jalysa Swopes s [facility] 08/23/2020, 8:12 AM

## 2020-08-23 NOTE — Progress Notes (Signed)
Patient continues to be noncompliant with diabetic diet and recommendations. Pt eating extra foods (carbs and simple sugars) and drinking multiple cups of sweet tea at bedside knowing blood glucose levels are markedly elevated.

## 2020-08-23 NOTE — Progress Notes (Signed)
Patient is being discharged home.  Discharge papers given and explained to patient, verbalized understanding.  Meds and f/u appointments reviewed.  Rx sent electronically to the pharmacy.  Pt made aware.  Awaiting taxi for transportation.

## 2020-08-23 NOTE — Progress Notes (Signed)
  Chaplain On-Call responded to Order Requisition reading: "patient requests prayer".  Chaplain met the patient as she was seated in the bedside chair awaiting the arrival of a Taxi to assist with her discharge.  The patient stated her gratitude that the medical problem with her kidneys appears to be resolved.  Chaplain provided spiritual and emotional support and prayer.  Chaplain Pollyann Samples M.Div., [facility]

## 2020-09-01 ENCOUNTER — Ambulatory Visit: Payer: Medicare Other | Admitting: Physician Assistant

## 2020-09-01 NOTE — Progress Notes (Deleted)
Office Visit    Patient Name: Erica Mercer Date of Encounter: 09/01/2020  PCP:  [name], NP   [place]  Cardiologist:  New, Dr. Rockey Situ Advanced Practice Provider:  No care team member to display Electrophysiologist:  None 46}   Chief Complaint    No chief complaint on file.   65 y.o. female with history of diabetes, bipolar disorder, hypertension, prior history of DKA, numerous emergency room visits and admissions, and here today for follow-up after recent admission 07/2020 for mental status change and nausea.  Past Medical History    Past Medical History:  Diagnosis Date   Bipolar 1 disorder ([place])    CAD (coronary artery disease)    Diabetes mellitus without complication ([place])    HLD (hyperlipidemia)    HTN (hypertension)    Past Surgical History:  Procedure Laterality Date   COLONOSCOPY     ESOPHAGOGASTRODUODENOSCOPY      Allergies  No Known Allergies  History of Present Illness    Erica Mercer is a 65 y.o. female with PMH as above.  She has history of type 1 diabetes.  She is followed at [facility] and has a history of cocaine use.  She was diagnosed with CAD 03/2016.  Notes indicate prior heart catheterization showed normal Cors, though results unavailable.  Also with prior echo documentation of normal EF.  No recent stress testing.  She has a history of tobacco use, depression, hypertension, DKA, bipolar.  On 07/2020, she presented to the emergency room with hypertension.  Specifically, systolic pressures over [id number].  She was also tachycardic and febrile with temperature of 103.  She received IVF and antibiotics.  Echo was completed and showed normal LVEF, NR WMA, no significant valvular disease.  It was noted that the images were suboptimal.  EKGs on admission were with significant artifact.  Some tracing showed lateral T wave inversion and nonspecific ST changes.  Troponin was elevated, thought 2/2 supply demand ischemia.  She denied  chest pain during admission or previous to admission.  Recommendation was to complete 48 hours of IV heparin.  As an outpatient, it was recommended she consider noninvasive ischemia testing to rule out high risk ischemia.  She was continued on ASA, amlodipine, losartan, and metoprolol.  Statin was deferred, given mild transaminitis.  It was noted she should consider adding clopidogrel with DAPT x1 year.    Home Medications   Current Outpatient Medications  Medication Instructions   albuterol (VENTOLIN HFA) 108 (90 Base) MCG/ACT inhaler 2 puffs, Inhalation, Every 6 hours PRN   amLODipine (NORVASC) 10 mg, Oral, Daily   aspirin 81 mg, Oral, Daily, Swallow whole.   atorvastatin (LIPITOR) 40 mg, Oral, Daily at bedtime   carvedilol (COREG) 25 mg, Oral, 2 times daily with meals   famotidine (PEPCID) 20 mg, Oral, Daily at bedtime   hydrALAZINE (APRESOLINE) 25 mg, Oral, Every 8 hours   insulin lispro (HUMALOG) 3 Units, Subcutaneous, 3 times daily before meals   Insulin Pen Needle 32G X 4 MM MISC 1 Dose, Does not apply, 3 times daily before meals & bedtime   Lantus SoloStar 20 Units, Subcutaneous, Daily   metoCLOPramide (REGLAN) 10 mg, Oral, Every 6 hours PRN   mirtazapine (REMERON) 15 mg, Oral, Daily at bedtime   pantoprazole (PROTONIX) 40 mg, Oral, 2 times daily   polyethylene glycol powder (GLYCOLAX/MIRALAX) 17 GM/SCOOP powder Dissolve 1 heaping tablespoon in 8 oz of water, juice, soda, and drink once daily.  sucralfate (CARAFATE) 1 g, Oral, 4 times daily     Review of Systems    ***.   All other systems reviewed and are otherwise negative except as noted above.  Physical Exam    VS:  There were no vitals taken for this visit. , BMI There is no height or weight on file to calculate BMI. GEN: Well nourished, well developed, in no acute distress. HEENT: normal. Neck: Supple, no JVD, carotid bruits, or masses. Cardiac: RRR, no murmurs, rubs, or gallops. No clubbing, cyanosis, edema.   Radials/DP/PT 2+ and equal bilaterally.  Respiratory:  Respirations regular and unlabored, clear to auscultation bilaterally. GI: Soft, nontender, nondistended, BS + x 4. MS: no deformity or atrophy. Skin: warm and dry, no rash. Neuro:  Strength and sensation are intact. Psych: Normal affect.  Accessory Clinical Findings    ECG personally reviewed by me today - *** - no acute changes.  VITALS Reviewed today   Temp Readings from Last 3 Encounters:  08/23/20 98.6 F (37 C)  08/14/20 98.4 F (36.9 C) (Oral)  08/03/20 98.8 F (37.1 C)   BP Readings from Last 3 Encounters:  08/23/20 (!) 165/88  08/14/20 (!) 140/92  08/03/20 (!) 139/113   Pulse Readings from Last 3 Encounters:  08/23/20 88  08/14/20 77  08/03/20 67    Wt Readings from Last 3 Encounters:  08/19/20 97 lb (44 kg)  08/14/20 97 lb (44 kg)  07/30/20 95 lb 7.4 oz (43.3 kg)     LABS  reviewed today   Lab Results  Component Value Date   WBC 6.6 08/23/2020   HGB 9.9 (L) 08/23/2020   HCT 29.4 (L) 08/23/2020   MCV 99.7 08/23/2020   PLT 257 08/23/2020   Lab Results  Component Value Date   CREATININE 1.64 (H) 08/23/2020   BUN 51 (H) 08/23/2020   NA 137 08/23/2020   K 4.9 08/23/2020   CL 107 08/23/2020   CO2 25 08/23/2020   Lab Results  Component Value Date   ALT 38 08/20/2020   AST 36 08/20/2020   ALKPHOS 176 (H) 08/20/2020   BILITOT 1.4 (H) 08/20/2020   Lab Results  Component Value Date   CHOL 267 (H) 08/21/2020   HDL 65 08/21/2020   LDLCALC 153 (H) 08/21/2020   TRIG 243 (H) 08/21/2020   CHOLHDL 4.1 08/21/2020    Lab Results  Component Value Date   HGBA1C 15.4 (H) 08/20/2020   Lab Results  Component Value Date   TSH 1.432 08/01/2020     STUDIES/PROCEDURES reviewed today   ***  Assessment & Plan    ***  Medication changes: *** Labs ordered: *** Studies / Imaging ordered: *** Future considerations: *** Disposition: ***  *Please be aware that the above documentation was  completed voice recognition software and may contain dictation errors.    Total time spent with patient today *** minutes. This includes reviewing records, evaluating the patient, and coordinating care. Face-to-face time >50%.    Arvil Chaco, PA-C 09/01/2020

## 2020-09-02 ENCOUNTER — Encounter: Payer: Self-pay | Admitting: Physician Assistant

## 2020-09-03 ENCOUNTER — Emergency Department: Payer: Medicare Other

## 2020-09-03 ENCOUNTER — Inpatient Hospital Stay
Admission: EM | Admit: 2020-09-03 | Discharge: 2020-09-05 | DRG: 250 | Disposition: A | Discharge status: Other Acute Inpatient Hospital | Payer: Medicare Other | Attending: Internal Medicine | Admitting: Internal Medicine

## 2020-09-03 ENCOUNTER — Other Ambulatory Visit: Payer: Self-pay

## 2020-09-03 DIAGNOSIS — F17210 Nicotine dependence, cigarettes, uncomplicated: Secondary | ICD-10-CM | POA: Diagnosis present

## 2020-09-03 DIAGNOSIS — E1165 Type 2 diabetes mellitus with hyperglycemia: Secondary | ICD-10-CM | POA: Diagnosis present

## 2020-09-03 DIAGNOSIS — E11649 Type 2 diabetes mellitus with hypoglycemia without coma: Secondary | ICD-10-CM | POA: Diagnosis not present

## 2020-09-03 DIAGNOSIS — Z7982 Long term (current) use of aspirin: Secondary | ICD-10-CM

## 2020-09-03 DIAGNOSIS — K219 Gastro-esophageal reflux disease without esophagitis: Secondary | ICD-10-CM | POA: Diagnosis present

## 2020-09-03 DIAGNOSIS — F32A Depression, unspecified: Secondary | ICD-10-CM | POA: Diagnosis present

## 2020-09-03 DIAGNOSIS — R739 Hyperglycemia, unspecified: Secondary | ICD-10-CM | POA: Diagnosis present

## 2020-09-03 DIAGNOSIS — K3184 Gastroparesis: Secondary | ICD-10-CM | POA: Diagnosis present

## 2020-09-03 DIAGNOSIS — F319 Bipolar disorder, unspecified: Secondary | ICD-10-CM

## 2020-09-03 DIAGNOSIS — Z720 Tobacco use: Secondary | ICD-10-CM | POA: Diagnosis present

## 2020-09-03 DIAGNOSIS — F419 Anxiety disorder, unspecified: Secondary | ICD-10-CM | POA: Diagnosis present

## 2020-09-03 DIAGNOSIS — I509 Heart failure, unspecified: Secondary | ICD-10-CM

## 2020-09-03 DIAGNOSIS — E1010 Type 1 diabetes mellitus with ketoacidosis without coma: Secondary | ICD-10-CM | POA: Diagnosis present

## 2020-09-03 DIAGNOSIS — E785 Hyperlipidemia, unspecified: Secondary | ICD-10-CM | POA: Diagnosis present

## 2020-09-03 DIAGNOSIS — Z9114 Patient's other noncompliance with medication regimen: Secondary | ICD-10-CM | POA: Diagnosis not present

## 2020-09-03 DIAGNOSIS — Z794 Long term (current) use of insulin: Secondary | ICD-10-CM | POA: Diagnosis not present

## 2020-09-03 DIAGNOSIS — N179 Acute kidney failure, unspecified: Secondary | ICD-10-CM | POA: Diagnosis present

## 2020-09-03 DIAGNOSIS — D638 Anemia in other chronic diseases classified elsewhere: Secondary | ICD-10-CM | POA: Diagnosis not present

## 2020-09-03 DIAGNOSIS — G4700 Insomnia, unspecified: Secondary | ICD-10-CM | POA: Diagnosis present

## 2020-09-03 DIAGNOSIS — Z681 Body mass index (BMI) 19 or less, adult: Secondary | ICD-10-CM | POA: Diagnosis not present

## 2020-09-03 DIAGNOSIS — I2510 Atherosclerotic heart disease of native coronary artery without angina pectoris: Secondary | ICD-10-CM | POA: Diagnosis present

## 2020-09-03 DIAGNOSIS — R778 Other specified abnormalities of plasma proteins: Secondary | ICD-10-CM | POA: Diagnosis present

## 2020-09-03 DIAGNOSIS — I161 Hypertensive emergency: Secondary | ICD-10-CM | POA: Diagnosis present

## 2020-09-03 DIAGNOSIS — I249 Acute ischemic heart disease, unspecified: Secondary | ICD-10-CM

## 2020-09-03 DIAGNOSIS — I130 Hypertensive heart and chronic kidney disease with heart failure and stage 1 through stage 4 chronic kidney disease, or unspecified chronic kidney disease: Secondary | ICD-10-CM | POA: Diagnosis present

## 2020-09-03 DIAGNOSIS — Z8249 Family history of ischemic heart disease and other diseases of the circulatory system: Secondary | ICD-10-CM

## 2020-09-03 DIAGNOSIS — I214 Non-ST elevation (NSTEMI) myocardial infarction: Secondary | ICD-10-CM | POA: Diagnosis present

## 2020-09-03 DIAGNOSIS — Z79899 Other long term (current) drug therapy: Secondary | ICD-10-CM | POA: Diagnosis not present

## 2020-09-03 DIAGNOSIS — I5031 Acute diastolic (congestive) heart failure: Secondary | ICD-10-CM | POA: Diagnosis present

## 2020-09-03 DIAGNOSIS — E1122 Type 2 diabetes mellitus with diabetic chronic kidney disease: Secondary | ICD-10-CM | POA: Diagnosis present

## 2020-09-03 DIAGNOSIS — E43 Unspecified severe protein-calorie malnutrition: Secondary | ICD-10-CM | POA: Diagnosis present

## 2020-09-03 DIAGNOSIS — N1831 Chronic kidney disease, stage 3a: Secondary | ICD-10-CM | POA: Diagnosis not present

## 2020-09-03 DIAGNOSIS — Z20822 Contact with and (suspected) exposure to covid-19: Secondary | ICD-10-CM | POA: Diagnosis present

## 2020-09-03 DIAGNOSIS — I10 Essential (primary) hypertension: Secondary | ICD-10-CM | POA: Diagnosis not present

## 2020-09-03 DIAGNOSIS — J449 Chronic obstructive pulmonary disease, unspecified: Secondary | ICD-10-CM | POA: Diagnosis present

## 2020-09-03 DIAGNOSIS — E782 Mixed hyperlipidemia: Secondary | ICD-10-CM | POA: Diagnosis not present

## 2020-09-03 DIAGNOSIS — K642 Third degree hemorrhoids: Secondary | ICD-10-CM | POA: Diagnosis not present

## 2020-09-03 DIAGNOSIS — N1832 Chronic kidney disease, stage 3b: Secondary | ICD-10-CM | POA: Diagnosis present

## 2020-09-03 DIAGNOSIS — N183 Chronic kidney disease, stage 3 unspecified: Secondary | ICD-10-CM | POA: Diagnosis present

## 2020-09-03 DIAGNOSIS — I2584 Coronary atherosclerosis due to calcified coronary lesion: Secondary | ICD-10-CM | POA: Diagnosis not present

## 2020-09-03 DIAGNOSIS — E1143 Type 2 diabetes mellitus with diabetic autonomic (poly)neuropathy: Secondary | ICD-10-CM | POA: Diagnosis present

## 2020-09-03 DIAGNOSIS — I252 Old myocardial infarction: Secondary | ICD-10-CM

## 2020-09-03 DIAGNOSIS — E440 Moderate protein-calorie malnutrition: Secondary | ICD-10-CM | POA: Diagnosis present

## 2020-09-03 DIAGNOSIS — E1110 Type 2 diabetes mellitus with ketoacidosis without coma: Secondary | ICD-10-CM | POA: Diagnosis present

## 2020-09-03 LAB — BASIC METABOLIC PANEL
Anion gap: 13 (ref 5–15)
Anion gap: 8 (ref 5–15)
BUN: 46 mg/dL — ABNORMAL HIGH (ref 8–23)
BUN: 45 mg/dL — ABNORMAL HIGH (ref 8–23)
CO2: 24 mmol/L (ref 22–32)
CO2: 26 mmol/L (ref 22–32)
Calcium: 9.4 mg/dL (ref 8.9–10.3)
Calcium: 9.7 mg/dL (ref 8.9–10.3)
Chloride: 99 mmol/L (ref 98–111)
Chloride: 106 mmol/L (ref 98–111)
Creatinine, Ser: 1.48 mg/dL — ABNORMAL HIGH (ref 0.44–1.00)
Creatinine, Ser: 1.25 mg/dL — ABNORMAL HIGH (ref 0.44–1.00)
GFR, Estimated: 39 mL/min — ABNORMAL LOW (ref >60)
GFR, Estimated: 48 mL/min — ABNORMAL LOW (ref >60)
Glucose, Bld: 638 mg/dL (ref 70–99)
Glucose, Bld: 220 mg/dL — ABNORMAL HIGH (ref 70–99)
Potassium: 4.6 mmol/L (ref 3.5–5.1)
Potassium: 4 mmol/L (ref 3.5–5.1)
Sodium: 136 mmol/L (ref 135–145)
Sodium: 140 mmol/L (ref 135–145)

## 2020-09-03 LAB — CBC
HCT: 29.4 % — ABNORMAL LOW (ref 36.0–46.0)
Hemoglobin: 9.7 g/dL — ABNORMAL LOW (ref 12.0–15.0)
MCH: 33.6 pg (ref 26.0–34.0)
MCHC: 33 g/dL (ref 30.0–36.0)
MCV: 101.7 fL — ABNORMAL HIGH (ref 80.0–100.0)
Platelets: 318 10*3/uL (ref 150–400)
RBC: 2.89 MIL/uL — ABNORMAL LOW (ref 3.87–5.11)
RDW: 14.6 % (ref 11.5–15.5)
WBC: 11.1 10*3/uL — ABNORMAL HIGH (ref 4.0–10.5)
nRBC: 0 % (ref 0.0–0.2)

## 2020-09-03 LAB — CBG MONITORING, ED
Glucose-Capillary: >600 mg/dL (ref 70–99)
Glucose-Capillary: 525 mg/dL (ref 70–99)
Glucose-Capillary: 442 mg/dL — ABNORMAL HIGH (ref 70–99)
Glucose-Capillary: 387 mg/dL — ABNORMAL HIGH (ref 70–99)
Glucose-Capillary: 407 mg/dL — ABNORMAL HIGH (ref 70–99)
Glucose-Capillary: 315 mg/dL — ABNORMAL HIGH (ref 70–99)
Glucose-Capillary: 268 mg/dL — ABNORMAL HIGH (ref 70–99)
Glucose-Capillary: 185 mg/dL — ABNORMAL HIGH (ref 70–99)
Glucose-Capillary: 150 mg/dL — ABNORMAL HIGH (ref 70–99)
Glucose-Capillary: 270 mg/dL — ABNORMAL HIGH (ref 70–99)
Glucose-Capillary: 266 mg/dL — ABNORMAL HIGH (ref 70–99)

## 2020-09-03 LAB — APTT: aPTT: 25 seconds (ref 24–36)

## 2020-09-03 LAB — OSMOLALITY: Osmolality: 338 mOsm/kg (ref 275–295)

## 2020-09-03 LAB — URINALYSIS, COMPLETE (UACMP) WITH MICROSCOPIC
Bilirubin Urine: NEGATIVE
Glucose, UA: >=500 mg/dL — AB
Hgb urine dipstick: NEGATIVE
Ketones, ur: NEGATIVE mg/dL
Leukocytes,Ua: NEGATIVE
Nitrite: NEGATIVE
Protein, ur: 100 mg/dL — AB
Specific Gravity, Urine: 1.011 (ref 1.005–1.030)
pH: 5 (ref 5.0–8.0)

## 2020-09-03 LAB — HEPATIC FUNCTION PANEL
ALT: 55 U/L — ABNORMAL HIGH (ref 0–44)
AST: 86 U/L — ABNORMAL HIGH (ref 15–41)
Albumin: 3.2 g/dL — ABNORMAL LOW (ref 3.5–5.0)
Alkaline Phosphatase: 143 U/L — ABNORMAL HIGH (ref 38–126)
Bilirubin, Direct: 0.1 mg/dL (ref 0.0–0.2)
Indirect Bilirubin: 0.7 mg/dL (ref 0.3–0.9)
Total Bilirubin: 0.8 mg/dL (ref 0.3–1.2)
Total Protein: 6.6 g/dL (ref 6.5–8.1)

## 2020-09-03 LAB — URINE DRUG SCREEN, QUALITATIVE (ARMC ONLY)
Amphetamines, Ur Screen: NOT DETECTED
Barbiturates, Ur Screen: NOT DETECTED
Benzodiazepine, Ur Scrn: NOT DETECTED
Cannabinoid 50 Ng, Ur ~~LOC~~: POSITIVE — AB
Cocaine Metabolite,Ur ~~LOC~~: NOT DETECTED
MDMA (Ecstasy)Ur Screen: NOT DETECTED
Methadone Scn, Ur: NOT DETECTED
Opiate, Ur Screen: NOT DETECTED
Phencyclidine (PCP) Ur S: NOT DETECTED
Tricyclic, Ur Screen: NOT DETECTED

## 2020-09-03 LAB — TROPONIN I (HIGH SENSITIVITY)
Troponin I (High Sensitivity): 154 ng/L (ref <18)
Troponin I (High Sensitivity): 237 ng/L (ref <18)

## 2020-09-03 LAB — HEPARIN LEVEL (UNFRACTIONATED): Heparin Unfractionated: 0.18 IU/mL — ABNORMAL LOW (ref 0.30–0.70)

## 2020-09-03 LAB — BETA-HYDROXYBUTYRIC ACID: Beta-Hydroxybutyric Acid: 0.13 mmol/L (ref 0.05–0.27)

## 2020-09-03 LAB — SARS CORONAVIRUS 2 (TAT 6-24 HRS): SARS Coronavirus 2: NEGATIVE

## 2020-09-03 LAB — BRAIN NATRIURETIC PEPTIDE: B Natriuretic Peptide: 1371.8 pg/mL — ABNORMAL HIGH (ref 0.0–100.0)

## 2020-09-03 LAB — PROTIME-INR
INR: 1 (ref 0.8–1.2)
Prothrombin Time: 13.5 seconds (ref 11.4–15.2)

## 2020-09-03 LAB — TSH: TSH: 1.029 u[IU]/mL (ref 0.350–4.500)

## 2020-09-03 MED ORDER — NITROGLYCERIN 0.4 MG SL SUBL: 0.4000 mg | SUBLINGUAL_TABLET | SUBLINGUAL | Status: DC | PRN

## 2020-09-03 MED ORDER — METOCLOPRAMIDE HCL 10 MG PO TABS: 10.0000 mg | ORAL_TABLET | Freq: Four times a day (QID) | ORAL | Status: AC | PRN

## 2020-09-03 MED ORDER — ONDANSETRON HCL 4 MG/2ML IJ SOLN
4.0000 mg | Freq: Four times a day (QID) | INTRAMUSCULAR | Status: DC | PRN
  Administered 2020-09-03: 4 mg via INTRAVENOUS
  Filled 2020-09-03: qty 2

## 2020-09-03 MED ORDER — LORAZEPAM 2 MG/ML IJ SOLN
0.5000 mg | Freq: Once | INTRAMUSCULAR | Status: AC
  Administered 2020-09-03: 0.5 mg via INTRAVENOUS
  Filled 2020-09-03: qty 1

## 2020-09-03 MED ORDER — DEXTROSE IN LACTATED RINGERS 5 % IV SOLN: INTRAVENOUS | Status: DC

## 2020-09-03 MED ORDER — LACTATED RINGERS IV SOLN: INTRAVENOUS | Status: DC

## 2020-09-03 MED ORDER — ASPIRIN EC 81 MG PO TBEC
81.0000 mg | DELAYED_RELEASE_TABLET | Freq: Every day | ORAL | Status: DC
  Administered 2020-09-04 – 2020-09-05 (×2): 81 mg via ORAL
  Filled 2020-09-03 (×2): qty 1

## 2020-09-03 MED ORDER — ONDANSETRON HCL 4 MG PO TABS
4.0000 mg | ORAL_TABLET | Freq: Four times a day (QID) | ORAL | Status: DC | PRN
Start: 2020-09-03 — End: 2020-09-03

## 2020-09-03 MED ORDER — NITROGLYCERIN 2 % TD OINT
1.0000 [in_us] | TOPICAL_OINTMENT | Freq: Three times a day (TID) | TRANSDERMAL | Status: DC
  Administered 2020-09-03 – 2020-09-05 (×6): 1 [in_us] via TOPICAL
  Filled 2020-09-03 (×6): qty 1

## 2020-09-03 MED ORDER — INSULIN ASPART 100 UNIT/ML IJ SOLN
0.0000 [IU] | Freq: Every day | INTRAMUSCULAR | Status: DC
  Administered 2020-09-03: 3 [IU] via SUBCUTANEOUS
  Administered 2020-09-04: 2 [IU] via SUBCUTANEOUS
  Filled 2020-09-03 (×2): qty 1

## 2020-09-03 MED ORDER — AMLODIPINE BESYLATE 10 MG PO TABS
10.0000 mg | ORAL_TABLET | Freq: Every day | ORAL | Status: DC
  Administered 2020-09-03 – 2020-09-05 (×3): 10 mg via ORAL
  Filled 2020-09-03 (×2): qty 1
  Filled 2020-09-03: qty 2

## 2020-09-03 MED ORDER — ONDANSETRON HCL 4 MG/2ML IJ SOLN
4.0000 mg | Freq: Four times a day (QID) | INTRAMUSCULAR | Status: DC | PRN
Start: 2020-09-03 — End: 2020-09-03

## 2020-09-03 MED ORDER — HYDRALAZINE HCL 20 MG/ML IJ SOLN
5.0000 mg | Freq: Four times a day (QID) | INTRAMUSCULAR | Status: DC | PRN
Start: 2020-09-03 — End: 2020-09-05

## 2020-09-03 MED ORDER — AMLODIPINE BESYLATE 5 MG PO TABS: 10.0000 mg | ORAL_TABLET | Freq: Every day | ORAL | Status: DC

## 2020-09-03 MED ORDER — ASPIRIN 81 MG PO CHEW
324.0000 mg | CHEWABLE_TABLET | Freq: Once | ORAL | Status: AC
  Administered 2020-09-03: 324 mg via ORAL
  Filled 2020-09-03: qty 4

## 2020-09-03 MED ORDER — POTASSIUM CHLORIDE 10 MEQ/100ML IV SOLN
10.0000 meq | INTRAVENOUS | Status: AC
  Administered 2020-09-03 (×2): 10 meq via INTRAVENOUS
  Filled 2020-09-03 (×2): qty 100

## 2020-09-03 MED ORDER — ATORVASTATIN CALCIUM 20 MG PO TABS
40.0000 mg | ORAL_TABLET | Freq: Every day | ORAL | Status: DC
  Administered 2020-09-03 – 2020-09-04 (×2): 40 mg via ORAL
  Filled 2020-09-03 (×2): qty 2

## 2020-09-03 MED ORDER — KETOROLAC TROMETHAMINE 30 MG/ML IJ SOLN
15.0000 mg | Freq: Four times a day (QID) | INTRAMUSCULAR | Status: AC | PRN
  Administered 2020-09-04: 15 mg via INTRAVENOUS
  Filled 2020-09-03 (×2): qty 1

## 2020-09-03 MED ORDER — INSULIN ASPART 100 UNIT/ML IJ SOLN
0.0000 [IU] | Freq: Three times a day (TID) | INTRAMUSCULAR | Status: DC
  Administered 2020-09-04: 2 [IU] via SUBCUTANEOUS
  Administered 2020-09-04: 5 [IU] via SUBCUTANEOUS
  Administered 2020-09-04: 2 [IU] via SUBCUTANEOUS
  Filled 2020-09-03 (×3): qty 1

## 2020-09-03 MED ORDER — MIRTAZAPINE 15 MG PO TABS
15.0000 mg | ORAL_TABLET | Freq: Every day | ORAL | Status: DC
  Administered 2020-09-03: 15 mg via ORAL
  Filled 2020-09-03: qty 1

## 2020-09-03 MED ORDER — CARVEDILOL 25 MG PO TABS
25.0000 mg | ORAL_TABLET | Freq: Two times a day (BID) | ORAL | Status: DC
  Administered 2020-09-03 – 2020-09-05 (×4): 25 mg via ORAL
  Filled 2020-09-03 (×4): qty 1

## 2020-09-03 MED ORDER — INSULIN GLARGINE-YFGN 100 UNIT/ML ~~LOC~~ SOLN
20.0000 [IU] | Freq: Every day | SUBCUTANEOUS | Status: DC
  Filled 2020-09-03: qty 0.2

## 2020-09-03 MED ORDER — FUROSEMIDE 10 MG/ML IJ SOLN
20.0000 mg | Freq: Once | INTRAMUSCULAR | Status: AC
  Administered 2020-09-03: 20 mg via INTRAVENOUS
  Filled 2020-09-03: qty 4

## 2020-09-03 MED ORDER — MORPHINE SULFATE (PF) 2 MG/ML IV SOLN
0.5000 mg | INTRAVENOUS | Status: AC | PRN
  Administered 2020-09-03 – 2020-09-04 (×3): 0.5 mg via INTRAVENOUS
  Filled 2020-09-03 (×3): qty 1

## 2020-09-03 MED ORDER — HYDRALAZINE HCL 25 MG PO TABS
25.0000 mg | ORAL_TABLET | Freq: Three times a day (TID) | ORAL | Status: DC
  Administered 2020-09-03 – 2020-09-05 (×6): 25 mg via ORAL
  Filled 2020-09-03 (×6): qty 1

## 2020-09-03 MED ORDER — HEPARIN BOLUS VIA INFUSION
1300.0000 [IU] | Freq: Once | INTRAVENOUS | Status: AC
  Administered 2020-09-04: 1300 [IU] via INTRAVENOUS
  Filled 2020-09-03: qty 1300

## 2020-09-03 MED ORDER — FAMOTIDINE 20 MG PO TABS
20.0000 mg | ORAL_TABLET | Freq: Every day | ORAL | Status: DC
  Administered 2020-09-03 – 2020-09-04 (×2): 20 mg via ORAL
  Filled 2020-09-03 (×2): qty 1

## 2020-09-03 MED ORDER — INSULIN GLARGINE-YFGN 100 UNIT/ML ~~LOC~~ SOLN
20.0000 [IU] | Freq: Every day | SUBCUTANEOUS | Status: DC
  Administered 2020-09-03: 20 [IU] via SUBCUTANEOUS
  Filled 2020-09-03 (×2): qty 0.2

## 2020-09-03 MED ORDER — INSULIN REGULAR(HUMAN) IN NACL 100-0.9 UT/100ML-% IV SOLN
INTRAVENOUS | Status: DC
  Administered 2020-09-03: 6.5 [IU]/h via INTRAVENOUS
  Filled 2020-09-03: qty 100

## 2020-09-03 MED ORDER — ACETAMINOPHEN 650 MG RE SUPP: 650.0000 mg | Freq: Four times a day (QID) | RECTAL | Status: DC | PRN

## 2020-09-03 MED ORDER — ATORVASTATIN CALCIUM 20 MG PO TABS: 40.0000 mg | ORAL_TABLET | Freq: Every day | ORAL | Status: DC

## 2020-09-03 MED ORDER — HEPARIN BOLUS VIA INFUSION
2000.0000 [IU] | Freq: Once | INTRAVENOUS | Status: AC
  Administered 2020-09-03: 2000 [IU] via INTRAVENOUS
  Filled 2020-09-03: qty 2000

## 2020-09-03 MED ORDER — HEPARIN (PORCINE) 25000 UT/250ML-% IV SOLN
700.0000 [IU]/h | INTRAVENOUS | Status: DC
  Administered 2020-09-03: 550 [IU]/h via INTRAVENOUS
  Administered 2020-09-04: 700 [IU]/h via INTRAVENOUS
  Filled 2020-09-03 (×2): qty 250

## 2020-09-03 MED ORDER — DEXTROSE 50 % IV SOLN: 0.0000 mL | INTRAVENOUS | Status: DC | PRN

## 2020-09-03 MED ORDER — ACETAMINOPHEN 325 MG PO TABS: 650.0000 mg | ORAL_TABLET | Freq: Four times a day (QID) | ORAL | Status: DC | PRN

## 2020-09-03 NOTE — ED Notes (Signed)
Lab to draw all labs on this pt. This RN called lab to check on status of someone coming to collect BMP on this pt, they stated they would send someone as soon as possible.

## 2020-09-03 NOTE — ED Notes (Signed)
BGL 407 per endotool no change in insulin rate due at this time

## 2020-09-03 NOTE — ED Notes (Signed)
No change in insulin rate due at this time per endotool

## 2020-09-03 NOTE — ED Notes (Signed)
This RN had secretary call for hospital bed for this pt at this time.

## 2020-09-03 NOTE — ED Notes (Signed)
Critical lab value troponin of 151 from lab. This RN notified MD Isaacs at this time.

## 2020-09-03 NOTE — ED Notes (Signed)
Per endotool, for BGL of 315, maintain insulin gtt rate at 7u/hr

## 2020-09-03 NOTE — H&P (Signed)
History and Physical   Erica Mercer YDX:[id number] DOB: 06/05/1955 DOA: 09/03/2020  PCP: [name], NP  Patient coming from: Home via EMS  I have personally briefly reviewed patient's old medical records in [place].  Chief Concern: Shortness of breath and chest pressure  HPI: Erica Mercer is a 65 y.o. female with medical history significant for insulin-dependent diabetes mellitus type 1, hypertension, hyperlipidemia, tobacco abuse, history of medication noncompliance, presents to the emergency department for chief concerns of shortness of breath and chest pressure.  She endorses waking up feeling dizziness, shortness of breath and epigastric chest pressure. She states the chest pressure and shortness of breath was persistent until EMS arrived. She states the oxygen helped to improve her symptoms. She denies fever, cough, current chest pain, vomiting, dysuria. She endorses loose stool in the last two days.   She states she didn't take her insulin today and endorsed taking her insulin yesterday.   She reports that the chest pain and shortness of breath improved with oxygen supplementation and medications given from EDP.  Social history: She current lives with her brother, Isac Caddy. She smokes 0.5 ppd. She drinks merlot, about one bottle per month. She smokes marijuana and reports the last time she used cocaine was 25 years ago.  Vaccination history: She states she is vaccinated for covid 19, with [place], two doses  ROS: Constitutional: no weight change, no fever, + dizziness  ENT/Mouth: no sore throat, no rhinorrhea Eyes: no eye pain, no vision changes Cardiovascular: + chest pain, + dyspnea,  no edema, no palpitations Respiratory: no cough, no sputum, no wheezing Gastrointestinal: no nausea, no vomiting, no diarrhea, no constipation Genitourinary: no urinary incontinence, no dysuria, no hematuria Musculoskeletal: no arthralgias, no myalgias Skin: no skin  lesions, no pruritus, Neuro: + weakness, no loss of consciousness, no syncope Psych: no anxiety, no depression, + decrease appetite Heme/Lymph: no bruising, no bleeding  ED Course: Discussed with emergency medicine provider, patient requiring hospitalization for shortness of breath and NSTEMI.  Vitals in the emergency department was remarkable for temperature 98.5, respiration rate of 22, heart rate of 104, blood pressure 162/104, spO2 83% on RA.  Labs in the emergency department was remarkable for sodium 136, potassium 4.6, chloride 99, bicarb 24, BUN 46, serum creatinine of 1.48, nonfasting blood glucose 638, EGFR 39, BNP 1371.8, troponin high-sensitivity is 154.  WBC 11.1, hemoglobin 9.7, platelets 318.  EDP ordered aspirin 324 mg p.o., Lasix 20 mg IV,Ativan 0.5 mg IV, insulin GTT, lactated ringer infusion at 125 ml per hour infusion.  EDP consulted cardiology, Dr. Humphrey Rolls who will see the patient.  Assessment/Plan  Active Problems:   DKA (diabetic ketoacidosis) (HCC)   Essential hypertension   HLD (hyperlipidemia)   DKA, type 1 (HCC)   COPD (chronic obstructive pulmonary disease) (HCC)   Bipolar 1 disorder (HCC)   NSTEMI (non-ST elevated myocardial infarction) (HCC)   CAD (coronary artery disease)   Tobacco abuse   Depression   Elevated troponin   Protein-calorie malnutrition, moderate (HCC)   CKD (chronic kidney disease), stage IIIa   # Shortness of breath suspect multifactorial including NSTEMI and DKA - Continue insulin GTT - Started heparin gtt. - Nitroglycerin 1 inch every 6 8 hours initiated - Dr. Humphrey Rolls, cardiologist has been consulted and will see the patient - Admit to stepdown inpatient with telemetry for 24 hours - Continue n.p.o. pending cardiology recommendations - Continue to trend troponin high-sensitivity, discuss with Amber, midlevel with cardiology team and she states  that she would like the patient to remain n.p.o. pending second troponin  # Insulin  dependent diabetes mellitus type 1-continue insulin GTT and treat as above  # Hypertensive urgency/emergency-I suspect secondary to diabetic ketoacidosis and in setting of NSTEMI - Treat as above - Resumed home Coreg including dose now, hydralazine 25 mg every 8 hours - Resumed amlodipine 10 mg daily for 09/04/2020 - Hydralazine 5 mg IV every 6 hours as needed for SBP greater than 170, 3 doses ordered  # Hyperlipidemia-atorvastatin 40 mg nightly resumed  # Moderate to severe protein malnutrition-mirtazapine 15 mg nightly resumed  # Gastroparesis-resumed home Reglan 10 mg every 6 hours as needed for nausea and vomiting - Ondansetron 4 mg IV every 6 hours as needed for refractory nausea and vomiting, 3 doses ordered  # Depression/anxiety/grieving-patient would benefit from psychiatry evaluation - She also ask about her previous medication, brexpiprazole, she states that she does not know why she was taken off of this medication and inquire about resuming it - I placed in epic consultation for psychiatry with Dr. Weber Cooks  # GERD  # COVID PCR screening is pending  # Med reconciliation is pending  Chart reviewed.   DVT prophylaxis: Heparin GTT Code Status: Full code Diet: N.p.o. pending second troponin Family Communication: No Disposition Plan: Pending clinical course Consults called: Cardiology Admission status: Inpatient, stepdown, telemetry for 24 hours ordered  Past Medical History:  Diagnosis Date   Bipolar 1 disorder ([place])    CAD (coronary artery disease)    Diabetes mellitus without complication ([place])    HLD (hyperlipidemia)    HTN (hypertension)    Past Surgical History:  Procedure Laterality Date   COLONOSCOPY     ESOPHAGOGASTRODUODENOSCOPY     Social History:  reports that she has been smoking. She has never used smokeless tobacco. She reports previous alcohol use. She reports current drug use. Frequency: 3.00 times per week. Drug: Marijuana.  No Known  Allergies Family History  Problem Relation Age of Onset   Heart disease Mother    Prostate cancer Father    Family history: Family history reviewed and not pertinent  Prior to Admission medications   Medication Sig Start Date End Date Taking? Authorizing Provider  albuterol (VENTOLIN HFA) 108 (90 Base) MCG/ACT inhaler Inhale 2 puffs into the lungs every 6 (six) hours as needed for wheezing. 05/07/20 05/02/21  Loletha Grayer, MD  amLODipine (NORVASC) 10 MG tablet Take 1 tablet (10 mg total) by mouth daily. 08/03/20 09/02/20  Loletha Grayer, MD  aspirin EC 81 MG EC tablet Take 1 tablet (81 mg total) by mouth daily. Swallow whole. 08/04/20   Loletha Grayer, MD  atorvastatin (LIPITOR) 40 MG tablet Take 1 tablet (40 mg total) by mouth at bedtime. 08/03/20 09/02/20  Loletha Grayer, MD  carvedilol (COREG) 25 MG tablet Take 1 tablet (25 mg total) by mouth 2 (two) times daily with a meal. 08/23/20   Sharen Hones, MD  famotidine (PEPCID) 20 MG tablet Take 1 tablet (20 mg total) by mouth at bedtime. 08/03/20   Loletha Grayer, MD  hydrALAZINE (APRESOLINE) 25 MG tablet Take 1 tablet (25 mg total) by mouth every 8 (eight) hours. 08/23/20   Sharen Hones, MD  insulin glargine (LANTUS SOLOSTAR) 100 UNIT/ML Solostar Pen Inject 20 Units into the skin daily. 08/23/20 09/22/20  Sharen Hones, MD  insulin lispro (HUMALOG) 100 UNIT/ML KwikPen Inject 3 Units into the skin 3 (three) times daily before meals. 08/23/20   Sharen Hones, MD  Insulin Pen  Needle 32G X 4 MM MISC 1 Dose by Does not apply route 4 (four) times daily -  before meals and at bedtime. 08/03/20   Loletha Grayer, MD  metoCLOPramide (REGLAN) 10 MG tablet Take 1 tablet (10 mg total) by mouth every 6 (six) hours as needed. 06/16/20   Carrie Mew, MD  mirtazapine (REMERON) 15 MG tablet Take 15 mg by mouth at bedtime. 08/14/20   [provider]  pantoprazole (PROTONIX) 40 MG tablet Take 1 tablet (40 mg total) by mouth 2 (two) times daily. 08/03/20    Loletha Grayer, MD  polyethylene glycol powder (GLYCOLAX/MIRALAX) 17 GM/SCOOP powder Dissolve 1 heaping tablespoon in 8 oz of water, juice, soda, and drink once daily. 08/08/20   [provider]  sucralfate (CARAFATE) 1 GM/10ML suspension Take 10 mLs (1 g total) by mouth 4 (four) times daily. 06/03/20 06/03/21  Naaman Plummer, MD   Physical Exam: Vitals:   09/03/20 1032 09/03/20 1145 09/03/20 1200 09/03/20 1203  BP:  (!) 163/122 (!) 187/114   Pulse: (!) 104  100 96  Resp:  16 (!) 22 (!) 22  Temp:      TempSrc:      SpO2:   (!) 83% 93%  Weight: 43 kg     Height: 5' 2"  (1.575 m)      Constitutional: appears older than chronological age, frail, cachectic, NAD, calm, comfortable Eyes: PERRL, lids and conjunctivae normal ENMT: Mucous membranes are moist. Posterior pharynx clear of any exudate or lesions.  Poor dentition.  Multiple teeth loss.  Hearing appropriate Neck: normal, supple, no masses, no thyromegaly Respiratory: clear to auscultation bilaterally, no wheezing, no crackles. Normal respiratory effort. No accessory muscle use.  Cardiovascular: Regular rate and rhythm, no murmurs / rubs / gallops. No extremity edema. 2+ pedal pulses. No carotid bruits.  Abdomen: Scaphoid abdomen, no tenderness, no masses palpated, no hepatosplenomegaly. Bowel sounds positive.  Musculoskeletal: no clubbing / cyanosis. No joint deformity upper and lower extremities. Good ROM, no contractures.bilateral lower extremity decreased muscle tones.  Skin: no rashes, lesions, ulcers. No induration Neurologic: Sensation intact. Strength 4/5 in all 4.  Psychiatric: Normal judgment and insight. Alert and oriented x 3. Normal mood.   EKG: independently reviewed, showing sinus tachycardia with rate of 102, LVH, QTC 463.  V5 V6 with ST depression that is unchanged from previous EKG specifically 07/30/2020.  Chest x-ray on Admission: I personally reviewed and I agree with radiologist reading as below.  DG Chest  2 View  Result Date: 09/03/2020 CLINICAL DATA:  Shortness of breath.  Abdominal and back pain. EXAM: CHEST - 2 VIEW COMPARISON:  08/20/2020 FINDINGS: Borderline cardiomegaly with bilateral interstitial accentuation including some mild Kerley B lines, increased from 08/20/2020. Atherosclerotic calcification of the aortic arch. Trace fluid in the minor fissure. No blunting of the costophrenic angles. Chronic calcified lesion along the right lateral breast/axilla. IMPRESSION: 1. New abnormal interstitial accentuation in the lungs with borderline enlargement of the cardiopericardial silhouette and some Kerley B lines, suspicious for acute cardiogenic interstitial edema. Atypical pneumonia is a possible but less likely differential diagnostic consideration. 2.  Aortic Atherosclerosis (ICD10-I70.0). Electronically Signed   By: Van Clines M.D.   On: 09/03/2020 12:12    Labs on Admission: I have personally reviewed following labs  CBC: Recent Labs  Lab 09/03/20 1035  WBC 11.1*  HGB 9.7*  HCT 29.4*  MCV 101.7*  PLT 001   Basic Metabolic Panel: Recent Labs  Lab 09/03/20 1035  NA 136  K 4.6  CL 99  CO2 24  GLUCOSE 638*  BUN 46*  CREATININE 1.48*  CALCIUM 9.4   GFR: Estimated Creatinine Clearance: 25.7 mL/min (A) (by C-G formula based on SCr of 1.48 mg/dL (H)).  Liver Function Tests: Recent Labs  Lab 09/03/20 1035  AST 86*  ALT 55*  ALKPHOS 143*  BILITOT 0.8  PROT 6.6  ALBUMIN 3.2*   CBG: Recent Labs  Lab 09/03/20 1229  GLUCAP >600*   Urine analysis:    Component Value Date/Time   COLORURINE STRAW (A) 08/20/2020 0049   APPEARANCEUR CLEAR (A) 08/20/2020 0049   LABSPEC 1.021 08/20/2020 0049   PHURINE 6.0 08/20/2020 0049   GLUCOSEU >=500 (A) 08/20/2020 0049   HGBUR NEGATIVE 08/20/2020 0049   BILIRUBINUR NEGATIVE 08/20/2020 0049   KETONESUR 20 (A) 08/20/2020 0049   PROTEINUR 100 (A) 08/20/2020 0049   NITRITE NEGATIVE 08/20/2020 0049   LEUKOCYTESUR NEGATIVE  08/20/2020 0049   Dr. Tobie Poet [facility]  If 7PM-7AM, please contact overnight-coverage provider If 7AM-7PM, please contact day coverage provider [email or web address]  09/03/2020, 1:05 PM

## 2020-09-03 NOTE — Consult Note (Signed)
Erica Mercer is a 65 y.o. female  [id number]  Primary Cardiologist: Neoma Laming, MD  Reason for Consultation: chest pain  HPI: Erica Mercer is a 64 y.o. female with history of hypertension, hyperlipidemia, diabetes, bipolar disorder, here with multiple complaints.  The patient is tearful and somewhat tangential on history taking.  She reports that her sister died approximately a week and a half ago, and since then she has had fatigue, lack of sleep, shortness of breath, and her blood pressure has been elevated.  She has had increasing dull, aching, chest pressure for the last several days as well.  She has had difficulty getting around her house due to this shortness of breath.  She states she has had associated fatigue.  Symptoms are worse with exertion.  Denies any alleviating factors.  No other acute complaints.  No recent medication changes.  Denies drug use.   Review of Systems: chest pressure, shortness of breath, fatigue   Past Medical History:  Diagnosis Date   Bipolar 1 disorder ([place])    CAD (coronary artery disease)    Diabetes mellitus without complication ([place])    HLD (hyperlipidemia)    HTN (hypertension)     (Not in a hospital admission)     [START ON 09/04/2020] amLODipine  10 mg Oral Daily   aspirin  324 mg Oral Once   [START ON 09/04/2020] aspirin  81 mg Oral Daily   atorvastatin  40 mg Oral QHS   carvedilol  25 mg Oral BID WC   heparin  2,000 Units Intravenous Once   hydrALAZINE  25 mg Oral Q8H   mirtazapine  15 mg Oral QHS   nitroGLYCERIN  1 inch Topical Q8H    Infusions:  dextrose 5% lactated ringers Stopped (09/03/20 1308)   heparin     insulin 6.5 Units/hr (09/03/20 1254)   lactated ringers 125 mL/hr at 09/03/20 1250   potassium chloride 10 mEq (09/03/20 1252)    No Known Allergies  Social History   Socioeconomic History   Marital status: Single    Spouse name: Not on file   Number of children: Not on file   Years of education: Not  on file   Highest education level: Not on file  Occupational History   Not on file  Tobacco Use   Smoking status: Every Day   Smokeless tobacco: Never  Vaping Use   Vaping Use: Never used  Substance and Sexual Activity   Alcohol use: Not Currently   Drug use: Yes    Frequency: 3.0 times per week    Types: Marijuana   Sexual activity: Not Currently  Other Topics Concern   Not on file  Social History Narrative   Not on file   Social Determinants of Health   Financial Resource Strain: Not on file  Food Insecurity: Not on file  Transportation Needs: Not on file  Physical Activity: Not on file  Stress: Not on file  Social Connections: Not on file  Intimate Partner Violence: Not on file    Family History  Problem Relation Age of Onset   Heart disease Mother    Prostate cancer Father     PHYSICAL EXAM: Vitals:   09/03/20 1200 09/03/20 1203  BP: (!) 187/114   Pulse: 100 96  Resp: (!) 22 (!) 22  Temp:    SpO2: (!) 83% 93%    No intake or output data in the 24 hours ending 09/03/20 1312  General:  Well appearing. No respiratory  difficulty HEENT: normal Neck: supple. no JVD. Carotids 2+ bilat; no bruits. No lymphadenopathy or thryomegaly appreciated. Cor: PMI nondisplaced. Regular rate & rhythm. No rubs, gallops or murmurs. Lungs: clear Abdomen: soft, nontender, nondistended. No hepatosplenomegaly. No bruits or masses. Good bowel sounds. Extremities: no cyanosis, clubbing, rash, edema Neuro: alert & oriented x 3, cranial nerves grossly intact. moves all 4 extremities w/o difficulty. Affect pleasant.  ECG: sinus tachycardia, HR 102, nonspecific ST changes, no acute changes.   Results for orders placed or performed during the hospital encounter of 09/03/20 (from the past 24 hour(s))  CBC     Status: Abnormal   Collection Time: 09/03/20 10:35 AM  Result Value Ref Range   WBC 11.1 (H) 4.0 - 10.5 K/uL   RBC 2.89 (L) 3.87 - 5.11 MIL/uL   Hemoglobin 9.7 (L) 12.0 - 15.0  g/dL   HCT 29.4 (L) 36.0 - 46.0 %   MCV 101.7 (H) 80.0 - 100.0 fL   MCH 33.6 26.0 - 34.0 pg   MCHC 33.0 30.0 - 36.0 g/dL   RDW 14.6 11.5 - 15.5 %   Platelets 318 150 - 400 K/uL   nRBC 0.0 0.0 - 0.2 %  Basic metabolic panel     Status: Abnormal   Collection Time: 09/03/20 10:35 AM  Result Value Ref Range   Sodium 136 135 - 145 mmol/L   Potassium 4.6 3.5 - 5.1 mmol/L   Chloride 99 98 - 111 mmol/L   CO2 24 22 - 32 mmol/L   Glucose, Bld 638 (HH) 70 - 99 mg/dL   BUN 46 (H) 8 - 23 mg/dL   Creatinine, Ser 1.48 (H) 0.44 - 1.00 mg/dL   Calcium 9.4 8.9 - 10.3 mg/dL   GFR, Estimated 39 (L) >60 mL/min   Anion gap 13 5 - 15  Troponin I (High Sensitivity)     Status: Abnormal   Collection Time: 09/03/20 10:35 AM  Result Value Ref Range   Troponin I (High Sensitivity) 154 (HH) <18 ng/L  Brain natriuretic peptide     Status: Abnormal   Collection Time: 09/03/20 10:35 AM  Result Value Ref Range   B Natriuretic Peptide 1,371.8 (H) 0.0 - 100.0 pg/mL  Hepatic function panel     Status: Abnormal   Collection Time: 09/03/20 10:35 AM  Result Value Ref Range   Total Protein 6.6 6.5 - 8.1 g/dL   Albumin 3.2 (L) 3.5 - 5.0 g/dL   AST 86 (H) 15 - 41 U/L   ALT 55 (H) 0 - 44 U/L   Alkaline Phosphatase 143 (H) 38 - 126 U/L   Total Bilirubin 0.8 0.3 - 1.2 mg/dL   Bilirubin, Direct 0.1 0.0 - 0.2 mg/dL   Indirect Bilirubin 0.7 0.3 - 0.9 mg/dL  Troponin I (High Sensitivity)     Status: Abnormal   Collection Time: 09/03/20 12:21 PM  Result Value Ref Range   Troponin I (High Sensitivity) 237 (HH) <18 ng/L  CBG monitoring, ED     Status: Abnormal   Collection Time: 09/03/20 12:29 PM  Result Value Ref Range   Glucose-Capillary >600 (HH) 70 - 99 mg/dL   DG Chest 2 View  Result Date: 09/03/2020 CLINICAL DATA:  Shortness of breath.  Abdominal and back pain. EXAM: CHEST - 2 VIEW COMPARISON:  08/20/2020 FINDINGS: Borderline cardiomegaly with bilateral interstitial accentuation including some mild Kerley B  lines, increased from 08/20/2020. Atherosclerotic calcification of the aortic arch. Trace fluid in the minor fissure. No blunting of the costophrenic  angles. Chronic calcified lesion along the right lateral breast/axilla. IMPRESSION: 1. New abnormal interstitial accentuation in the lungs with borderline enlargement of the cardiopericardial silhouette and some Kerley B lines, suspicious for acute cardiogenic interstitial edema. Atypical pneumonia is a possible but less likely differential diagnostic consideration. 2.  Aortic Atherosclerosis (ICD10-I70.0). Electronically Signed   By: Van Clines M.D.   On: 09/03/2020 12:12     ASSESSMENT AND PLAN: Patient continues to have shortness of breath, chest pressure. History of CAD, MI, uncontrolled HTN, smoker. Patient to remain NPO.  Heparin drip started, Nitro paste 1 inch every 8 hours, continue aspirin and Lasix.   Engineer, drilling FNP-C

## 2020-09-03 NOTE — Consult Note (Signed)
ANTICOAGULATION CONSULT NOTE  Pharmacy Consult for IV Heparin Indication: chest pain/ACS  Patient Measurements: Heparin Dosing Weight: 43 kg  Labs: Recent Labs    09/03/20 1035  HGB 9.7*  HCT 29.4*  PLT 318  CREATININE 1.48*  TROPONINIHS 154*    Estimated Creatinine Clearance: 25.7 mL/min (A) (by C-G formula based on SCr of 1.48 mg/dL (H)).   Medical History: Past Medical History:  Diagnosis Date   Bipolar 1 disorder ([place])    CAD (coronary artery disease)    Diabetes mellitus without complication ([place])    HLD (hyperlipidemia)    HTN (hypertension)     Medications:  No anticoagulation prior to admission per my chart review. Medication reconciliation is pending  Assessment: Patient is a 65 y/o F with medical history as above and including CAD / NSTEMI, cocaine abuse who presented to the ED with SOB / hyperglycemia / hypertensive emergency. Troponin elevated to 154. Pharmacy consulted for IV heparin for ACS.  Baseline CBC notable for Hgb 9.7 (appears c/w BL). Baseline aPTT and PT-INR are pending.  Goal of Therapy:  Heparin level 0.3-0.7 units/ml Monitor platelets by anticoagulation protocol: Yes   Plan:  --Heparin 2000 unit IV bolus x 1 followed by continuous infusion at 550 units/hr --Will check HL 8 hours after initiation of infusion --Daily CBC per protocol while on IV heparin  Benita Gutter 09/03/2020,12:54 PM

## 2020-09-03 NOTE — ED Triage Notes (Signed)
See first nurse note, pt EMS from home for shob that started when she woke up. Reports stomach and back hurts.  Pt shaking in lobby stating she cannot breathe, brought to triage to be assessed.

## 2020-09-03 NOTE — ED Notes (Signed)
Pt being taken to xray at this time

## 2020-09-03 NOTE — ED Notes (Signed)
This RN notified MD Cox of Serum Osmol of 338 on this pt at this time.

## 2020-09-03 NOTE — ED Notes (Signed)
Critical Glucose from lab of 638. MD Isaacs notified at this time. Pt placed on 2L [place] O2 at this time for O2 sats of 85% on RA post ativan administration. MD Isaacs notified at this time.

## 2020-09-03 NOTE — ED Notes (Signed)
No change in Insulin rate per endotool. Rate to stay at 6.5u/hr

## 2020-09-03 NOTE — ED Provider Notes (Signed)
[facility] Emergency Department Provider Note  ____________________________________________   Event Date/Time   First MD Initiated Contact with Patient 09/03/20 1057     (approximate)  I have reviewed the triage vital signs and the nursing notes.   HISTORY  Chief Complaint Shortness of Breath    HPI Erica Mercer is a 65 y.o. female with history of hypertension, hyperlipidemia, diabetes, bipolar disorder, here with multiple complaints.  The patient is tearful and somewhat tangential on history taking.  She reports that her sister died approximately a week and a half ago, and since then she has had fatigue, lack of sleep, shortness of breath, and her blood pressure has been elevated.  She has had increasing dull, aching, chest pressure for the last several days as well.  She has had difficulty getting around her house due to this shortness of breath.  She states she has had associated fatigue.  Symptoms are worse with exertion.  Denies any alleviating factors.  No other acute complaints.  No recent medication changes.  Denies drug use.    Past Medical History:  Diagnosis Date   Bipolar 1 disorder ([place])    CAD (coronary artery disease)    Diabetes mellitus without complication ([place])    HLD (hyperlipidemia)    HTN (hypertension)     Patient Active Problem List   Diagnosis Date Noted   Type II diabetes mellitus with renal manifestations ([place]) 08/20/2020   Nausea vomiting and diarrhea 08/20/2020   Protein-calorie malnutrition, moderate ([place]) 08/20/2020   CKD (chronic kidney disease), stage IIIa 08/20/2020   Vaginal itching 08/20/2020   Type 2 diabetes mellitus with hypoglycemia without coma, with long-term current use of insulin (HCC)    Elevated troponin    Weakness    Lactic acidosis    Hyperkalemia 07/30/2020   Hyponatremia [id number]   Acute metabolic encephalopathy [id number]   Hypertensive urgency 07/30/2020   Hematemesis of unknown  etiology 07/30/2020   Meningitis 07/30/2020   Stage 3a chronic kidney disease ([place])    CAD (coronary artery disease) 05/05/2020   Hyperosmolar hyperglycemic state (HHS) ([place]) 05/05/2020   Abdominal pain 05/05/2020   GERD (gastroesophageal reflux disease) 05/05/2020   Tobacco abuse 05/05/2020   Left arm numbness 05/05/2020   Depression 05/05/2020   Hyperglycemia    AKI (acute kidney injury) ([place]) 04/30/2020   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[place]) 04/30/2020   Sepsis ([place]) 04/30/2020   DKA, type 1 ([place]) 02/26/2020   Acute renal failure superimposed on stage 3a chronic kidney disease ([place])    DKA (diabetic ketoacidosis) ([place]) 02/25/2020   Severe protein-calorie malnutrition ([place]) 02/25/2020   Essential hypertension 02/25/2020   HLD (hyperlipidemia) 02/25/2020   Soft tissue mass 02/25/2020   H/O cocaine abuse ([place]) 02/17/2018   Bipolar 1 disorder ([place]) 04/22/2016   NSTEMI (non-ST elevated myocardial infarction) ([place]) 04/22/2016   COPD (chronic obstructive pulmonary disease) ([place]) 01/22/2016    Past Surgical History:  Procedure Laterality Date   COLONOSCOPY     ESOPHAGOGASTRODUODENOSCOPY      Prior to Admission medications   Medication Sig Start Date End Date Taking? Authorizing Provider  albuterol (VENTOLIN HFA) 108 (90 Base) MCG/ACT inhaler Inhale 2 puffs into the lungs every 6 (six) hours as needed for wheezing. 05/07/20 05/02/21 Yes Wieting, Richard, MD  amLODipine (NORVASC) 10 MG tablet Take 10 mg by mouth daily.   Yes [provider]  aspirin EC 81 MG EC tablet Take 1 tablet (81 mg total) by mouth daily.  Swallow whole. 08/04/20  Yes Wieting, Richard, MD  atorvastatin (LIPITOR) 40 MG tablet Take 40 mg by mouth daily.   Yes [provider]  famotidine (PEPCID) 20 MG tablet Take 1 tablet (20 mg total) by mouth at bedtime. 08/03/20  Yes Wieting, Richard, MD  insulin glargine (LANTUS SOLOSTAR) 100 UNIT/ML Solostar Pen Inject 20 Units into the skin daily.  08/23/20 09/22/20 Yes Sharen Hones, MD  insulin lispro (HUMALOG) 100 UNIT/ML KwikPen Inject 3 Units into the skin 3 (three) times daily before meals. 08/23/20  Yes Sharen Hones, MD  Insulin Pen Needle 32G X 4 MM MISC 1 Dose by Does not apply route 4 (four) times daily -  before meals and at bedtime. 08/03/20  Yes Wieting, Richard, MD  metoCLOPramide (REGLAN) 10 MG tablet Take 1 tablet (10 mg total) by mouth every 6 (six) hours as needed. 06/16/20  Yes Carrie Mew, MD  mirtazapine (REMERON) 15 MG tablet Take 15 mg by mouth at bedtime. 08/14/20  Yes [provider]  pantoprazole (PROTONIX) 40 MG tablet Take 1 tablet (40 mg total) by mouth 2 (two) times daily. 08/03/20  Yes Loletha Grayer, MD  carvedilol (COREG) 25 MG tablet Take 1 tablet (25 mg total) by mouth 2 (two) times daily with a meal. 08/23/20   Sharen Hones, MD  hydrALAZINE (APRESOLINE) 25 MG tablet Take 1 tablet (25 mg total) by mouth every 8 (eight) hours. 08/23/20   Sharen Hones, MD  sucralfate (CARAFATE) 1 GM/10ML suspension Take 10 mLs (1 g total) by mouth 4 (four) times daily. Patient not taking: Reported on 09/03/2020 06/03/20 06/03/21  Naaman Plummer, MD    Allergies Patient has no known allergies.  Family History  Problem Relation Age of Onset   Heart disease Mother    Prostate cancer Father     Social History Social History   Tobacco Use   Smoking status: Every Day   Smokeless tobacco: Never  Vaping Use   Vaping Use: Never used  Substance Use Topics   Alcohol use: Not Currently   Drug use: Yes    Frequency: 3.0 times per week    Types: Marijuana    Review of Systems  Review of Systems  Constitutional:  Positive for fatigue. Negative for fever.  HENT:  Negative for congestion and sore throat.   Eyes:  Negative for visual disturbance.  Respiratory:  Positive for chest tightness and shortness of breath. Negative for cough.   Cardiovascular:  Positive for chest pain.  Gastrointestinal:  Negative for  abdominal pain, diarrhea, nausea and vomiting.  Genitourinary:  Negative for flank pain.  Musculoskeletal:  Negative for back pain and neck pain.  Skin:  Negative for rash and wound.  Neurological:  Positive for weakness.  All other systems reviewed and are negative.   ____________________________________________  PHYSICAL EXAM:      VITAL SIGNS: ED Triage Vitals  Enc Vitals Group     BP 09/03/20 1031 (!) 162/104     Pulse Rate 09/03/20 1032 (!) 104     Resp 09/03/20 1031 (!) 22     Temp 09/03/20 1031 98.5 F (36.9 C)     Temp Source 09/03/20 1031 Oral     SpO2 09/03/20 1031 94 %     Weight 09/03/20 1032 94 lb 12.8 oz (43 kg)     Height 09/03/20 1032 '5\' 2"'$  (1.575 m)     Head Circumference --      Peak Flow --      Pain  Score 09/03/20 1032 4     Pain Loc --      Pain Edu? --      Excl. in [place]? --      Physical Exam Vitals and nursing note reviewed.  Constitutional:      General: She is not in acute distress.    Appearance: She is well-developed.  HENT:     Head: Normocephalic and atraumatic.  Eyes:     Conjunctiva/sclera: Conjunctivae normal.  Cardiovascular:     Rate and Rhythm: Regular rhythm. Tachycardia present.     Heart sounds: Normal heart sounds. No murmur heard.   No friction rub.  Pulmonary:     Effort: Pulmonary effort is normal. Tachypnea present. No respiratory distress.     Breath sounds: Examination of the right-lower field reveals rales. Examination of the left-lower field reveals rales. Rales present. No wheezing.  Abdominal:     General: There is no distension.     Palpations: Abdomen is soft.     Tenderness: There is no abdominal tenderness.  Musculoskeletal:     Cervical back: Neck supple.  Skin:    General: Skin is warm.     Capillary Refill: Capillary refill takes less than 2 seconds.  Neurological:     Mental Status: She is alert and oriented to person, place, and time.     Motor: No abnormal muscle tone.  Psychiatric:     Comments:  Anxious, tearful      ____________________________________________   LABS (all labs ordered are listed, but only abnormal results are displayed)  Labs Reviewed  CBC - Abnormal; Notable for the following components:      Result Value   WBC 11.1 (*)    RBC 2.89 (*)    Hemoglobin 9.7 (*)    HCT 29.4 (*)    MCV 101.7 (*)    All other components within normal limits  BASIC METABOLIC PANEL - Abnormal; Notable for the following components:   Glucose, Bld 638 (*)    BUN 46 (*)    Creatinine, Ser 1.48 (*)    GFR, Estimated 39 (*)    All other components within normal limits  BRAIN NATRIURETIC PEPTIDE - Abnormal; Notable for the following components:   B Natriuretic Peptide 1,371.8 (*)    All other components within normal limits  HEPATIC FUNCTION PANEL - Abnormal; Notable for the following components:   Albumin 3.2 (*)    AST 86 (*)    ALT 55 (*)    Alkaline Phosphatase 143 (*)    All other components within normal limits  URINE DRUG SCREEN, QUALITATIVE ([facility] ONLY) - Abnormal; Notable for the following components:   Cannabinoid 50 Ng, Ur [place] POSITIVE (*)    All other components within normal limits  OSMOLALITY - Abnormal; Notable for the following components:   Osmolality 338 (*)    All other components within normal limits  BASIC METABOLIC PANEL - Abnormal; Notable for the following components:   Glucose, Bld 220 (*)    BUN 45 (*)    Creatinine, Ser 1.25 (*)    GFR, Estimated 48 (*)    All other components within normal limits  CBG MONITORING, ED - Abnormal; Notable for the following components:   Glucose-Capillary >600 (*)    All other components within normal limits  CBG MONITORING, ED - Abnormal; Notable for the following components:   Glucose-Capillary 525 (*)    All other components within normal limits  CBG MONITORING, ED - Abnormal; Notable  for the following components:   Glucose-Capillary 442 (*)    All other components within normal limits  CBG MONITORING, ED -  Abnormal; Notable for the following components:   Glucose-Capillary 407 (*)    All other components within normal limits  CBG MONITORING, ED - Abnormal; Notable for the following components:   Glucose-Capillary 387 (*)    All other components within normal limits  CBG MONITORING, ED - Abnormal; Notable for the following components:   Glucose-Capillary 315 (*)    All other components within normal limits  CBG MONITORING, ED - Abnormal; Notable for the following components:   Glucose-Capillary 268 (*)    All other components within normal limits  TROPONIN I (HIGH SENSITIVITY) - Abnormal; Notable for the following components:   Troponin I (High Sensitivity) 154 (*)    All other components within normal limits  TROPONIN I (HIGH SENSITIVITY) - Abnormal; Notable for the following components:   Troponin I (High Sensitivity) 237 (*)    All other components within normal limits  SARS CORONAVIRUS 2 (TAT 6-24 HRS)  TSH  APTT  PROTIME-INR  HEPARIN LEVEL (UNFRACTIONATED)  BASIC METABOLIC PANEL  CBC    ____________________________________________  EKG: Normal sinus rhythm, ventricular rate 102.  PR 158, QRS 70, QTc 463.  Nonspecific ST changes, with ST depressions in the lateral precordial leads.  No acute ST elevations. ________________________________________  RADIOLOGY All imaging, including plain films, CT scans, and ultrasounds, independently reviewed by me, and interpretations confirmed via formal radiology reads.  ED MD interpretation:   Chest x-ray:New CHF   Official radiology report(s): DG Chest 2 View  Result Date: 09/03/2020 CLINICAL DATA:  Shortness of breath.  Abdominal and back pain. EXAM: CHEST - 2 VIEW COMPARISON:  08/20/2020 FINDINGS: Borderline cardiomegaly with bilateral interstitial accentuation including some mild Kerley B lines, increased from 08/20/2020. Atherosclerotic calcification of the aortic arch. Trace fluid in the minor fissure. No blunting of the  costophrenic angles. Chronic calcified lesion along the right lateral breast/axilla. IMPRESSION: 1. New abnormal interstitial accentuation in the lungs with borderline enlargement of the cardiopericardial silhouette and some Kerley B lines, suspicious for acute cardiogenic interstitial edema. Atypical pneumonia is a possible but less likely differential diagnostic consideration. 2.  Aortic Atherosclerosis (ICD10-I70.0). Electronically Signed   By: Van Clines M.D.   On: 09/03/2020 12:12    ____________________________________________  PROCEDURES   Procedure(s) performed (including Critical Care):  .Critical Care  Date/Time: 09/03/2020 7:09 PM Performed by: Duffy Bruce, MD Authorized by: Duffy Bruce, MD   Critical care provider statement:    Critical care time (minutes):  45   Critical care time was exclusive of:  Separately billable procedures and treating other patients and teaching time   Critical care was necessary to treat or prevent imminent or life-threatening deterioration of the following conditions:  Circulatory failure, cardiac failure, respiratory failure and sepsis   Critical care was time spent personally by me on the following activities:  Development of treatment plan with patient or surrogate, discussions with consultants, evaluation of patient's response to treatment, examination of patient, obtaining history from patient or surrogate, ordering and performing treatments and interventions, ordering and review of laboratory studies, ordering and review of radiographic studies, pulse oximetry, re-evaluation of patient's condition and review of old charts   I assumed direction of critical care for this patient from another provider in my specialty: no    ____________________________________________  INITIAL IMPRESSION / MDM / [place] / ED COURSE  As part of  my medical decision making, I reviewed the following data within the [place] notes reviewed and incorporated, Old chart reviewed, Notes from prior ED visits, and [place] Controlled Substance Database       *Markisha Ostenson was evaluated in Emergency Department on 09/03/2020 for the symptoms described in the history of present illness. She was evaluated in the context of the global COVID-19 pandemic, which necessitated consideration that the patient might be at risk for infection with the SARS-CoV-2 virus that causes COVID-19. Institutional protocols and algorithms that pertain to the evaluation of patients at risk for COVID-19 are in a state of rapid change based on information released by regulatory bodies including the CDC and federal and state organizations. These policies and algorithms were followed during the patient's care in the ED.  Some ED evaluations and interventions may be delayed as a result of limited staffing during the pandemic.*     Medical Decision Making:  65 yo F here with cough, SOB, chest pressure in setting of significant recent stress. Clinically, concern for [place]'s CM, DDx includes NSTEMI, new onset CHF. No fever or infectious sx. EKG reviewed and shows ST depressions in lateral precordial leads, c/w ischemia. No ST elevations. Dr. Humphrey Rolls of Cardiology consulted, and will plan to admit to medicine. ASA given. Otherwise, lab work does show marked hyperglycemia as well., though CO2 24 and AG is normal. BUN/Cr elevated likely from dehydration. Mild reactive leukocytosis noted.Trop, BNP elevated c/w likely demand ischemia, new onste CHF. Given h/o recurrent HHS/DKA, will place on insulin drip, admit to monitored unit.  ____________________________________________  FINAL CLINICAL IMPRESSION(S) / ED DIAGNOSES  Final diagnoses:  Hyperglycemia  AKI (acute kidney injury) ([place])  Elevated troponin  Acute congestive heart failure, unspecified heart failure type ([place])     MEDICATIONS GIVEN DURING THIS VISIT:  Medications  nitroGLYCERIN (NITROSTAT)  SL tablet 0.4 mg (has no administration in time range)  insulin regular, human (MYXREDLIN) 100 units/ 100 mL infusion (5.5 Units/hr Intravenous Rate/Dose Change 09/03/20 1806)  lactated ringers infusion ( Intravenous New Bag/Given 09/03/20 1250)  dextrose 5 % in lactated ringers infusion (0 mLs Intravenous Hold 09/03/20 1308)  dextrose 50 % solution 0-50 mL (has no administration in time range)  aspirin EC tablet 81 mg (has no administration in time range)  carvedilol (COREG) tablet 25 mg (25 mg Oral Given 09/03/20 1801)  hydrALAZINE (APRESOLINE) tablet 25 mg (25 mg Oral Given 09/03/20 1410)  mirtazapine (REMERON) tablet 15 mg (has no administration in time range)  metoCLOPramide (REGLAN) tablet 10 mg (has no administration in time range)  acetaminophen (TYLENOL) tablet 650 mg (has no administration in time range)    Or  acetaminophen (TYLENOL) suppository 650 mg (has no administration in time range)  hydrALAZINE (APRESOLINE) injection 5 mg (has no administration in time range)  nitroGLYCERIN (NITROGLYN) 2 % ointment 1 inch (1 inch Topical Given 09/03/20 1340)  heparin ADULT infusion 100 units/mL (25000 units/284m) (550 Units/hr Intravenous New Bag/Given 09/03/20 1457)  ondansetron (ZOFRAN) injection 4 mg (has no administration in time range)  morphine 2 MG/ML injection 0.5 mg (0.5 mg Intravenous Given 09/03/20 1710)  ketorolac (TORADOL) 15 MG/ML injection 15 mg (has no administration in time range)  amLODipine (NORVASC) tablet 10 mg (10 mg Oral Given 09/03/20 1801)  atorvastatin (LIPITOR) tablet 40 mg (has no administration in time range)  famotidine (PEPCID) tablet 20 mg (has no administration in time range)  insulin glargine-yfgn (SEMGLEE) injection 20 Units (has no administration in time  range)  LORazepam (ATIVAN) injection 0.5 mg (0.5 mg Intravenous Given 09/03/20 1154)  aspirin chewable tablet 324 mg (324 mg Oral Given 09/03/20 1340)  potassium chloride 10 mEq in 100 mL IVPB (0 mEq Intravenous  Stopped 09/03/20 1450)  furosemide (LASIX) injection 20 mg (20 mg Intravenous Given 09/03/20 1256)  heparin bolus via infusion 2,000 Units (2,000 Units Intravenous Bolus from Bag 09/03/20 1457)     ED Discharge Orders     None        Note:  This document was prepared using Dragon voice recognition software and may include unintentional dictation errors.   Duffy Bruce, MD 09/03/20 Pauline Aus

## 2020-09-03 NOTE — ED Notes (Signed)
This RN messaged MD [facility] at this time requesting something for pain at this pt's request. This pt stated their pain is too great (at 8/10) for tylenol and requests something stronger.

## 2020-09-03 NOTE — ED Notes (Signed)
First nurse note: comes ems from home with sob starting this morning upon waking up. COPD. 85-88% on RA. Got one duoneb, 98% RA. '125mg'$  solumedrol. 20g L FA. HTN. 200/100. CBG 592.   EMS thinks IV infiltrated.

## 2020-09-03 NOTE — Consult Note (Signed)
[place] for IV Heparin Indication: chest pain/ACS  Patient Measurements: Heparin Dosing Weight: 43 kg  Labs: Recent Labs    09/03/20 1035 09/03/20 1221 09/03/20 1320 09/03/20 1838 09/03/20 2214  HGB 9.7*  --   --   --   --   HCT 29.4*  --   --   --   --   PLT 318  --   --   --   --   APTT  --   --  25  --   --   LABPROT  --   --  13.5  --   --   INR  --   --  1.0  --   --   HEPARINUNFRC  --   --   --   --  0.18*  CREATININE 1.48*  --   --  1.25*  --   TROPONINIHS 154* 237*  --   --   --      Estimated Creatinine Clearance: 30.5 mL/min (A) (by C-G formula based on SCr of 1.25 mg/dL (H)).   Medical History: Past Medical History:  Diagnosis Date   Bipolar 1 disorder ([place])    CAD (coronary artery disease)    Diabetes mellitus without complication ([place])    HLD (hyperlipidemia)    HTN (hypertension)     Medications:  No anticoagulation prior to admission per my chart review. Medication reconciliation is pending  Assessment: Patient is a 65 y/o F with medical history as above and including CAD / NSTEMI, cocaine abuse who presented to the ED with SOB / hyperglycemia / hypertensive emergency. Troponin elevated to 154. Pharmacy consulted for IV heparin for ACS.  Baseline CBC notable for Hgb 9.7 (appears c/w BL). Baseline aPTT and PT-INR are pending.  Goal of Therapy:  Heparin level 0.3-0.7 units/ml Monitor platelets by anticoagulation protocol: Yes  08/10 2214 HL 0.18, subtherapeutic   Plan:  --Heparin 1300 unit IV bolus x 1  --Increase continuous infusion to 700 units/hr --Recheck HL 8 hr after rate change --Daily CBC per protocol while on IV heparin  Renda Rolls, PharmD, [facility] 09/03/2020 11:56 PM

## 2020-09-03 NOTE — ED Notes (Signed)
Lab called at this time to send someone to draw ordered labs

## 2020-09-04 ENCOUNTER — Encounter: Payer: Self-pay | Admitting: Internal Medicine

## 2020-09-04 ENCOUNTER — Ambulatory Visit: Payer: Self-pay | Admitting: Cardiovascular Disease

## 2020-09-04 DIAGNOSIS — F319 Bipolar disorder, unspecified: Secondary | ICD-10-CM

## 2020-09-04 LAB — CBC
HCT: 27.2 % — ABNORMAL LOW (ref 36.0–46.0)
HCT: 27 % — ABNORMAL LOW (ref 36.0–46.0)
Hemoglobin: 9.2 g/dL — ABNORMAL LOW (ref 12.0–15.0)
Hemoglobin: 9.1 g/dL — ABNORMAL LOW (ref 12.0–15.0)
MCH: 33.9 pg (ref 26.0–34.0)
MCH: 33.3 pg (ref 26.0–34.0)
MCHC: 33.8 g/dL (ref 30.0–36.0)
MCHC: 33.7 g/dL (ref 30.0–36.0)
MCV: 100.4 fL — ABNORMAL HIGH (ref 80.0–100.0)
MCV: 98.9 fL (ref 80.0–100.0)
Platelets: 336 10*3/uL (ref 150–400)
Platelets: 316 10*3/uL (ref 150–400)
RBC: 2.71 MIL/uL — ABNORMAL LOW (ref 3.87–5.11)
RBC: 2.73 MIL/uL — ABNORMAL LOW (ref 3.87–5.11)
RDW: 14.6 % (ref 11.5–15.5)
RDW: 14.9 % (ref 11.5–15.5)
WBC: 18.9 10*3/uL — ABNORMAL HIGH (ref 4.0–10.5)
WBC: 17.4 10*3/uL — ABNORMAL HIGH (ref 4.0–10.5)
nRBC: 0 % (ref 0.0–0.2)
nRBC: 0 % (ref 0.0–0.2)

## 2020-09-04 LAB — BASIC METABOLIC PANEL
Anion gap: 6 (ref 5–15)
Anion gap: 6 (ref 5–15)
BUN: 47 mg/dL — ABNORMAL HIGH (ref 8–23)
BUN: 46 mg/dL — ABNORMAL HIGH (ref 8–23)
CO2: 27 mmol/L (ref 22–32)
CO2: 27 mmol/L (ref 22–32)
Calcium: 9.4 mg/dL (ref 8.9–10.3)
Calcium: 9.4 mg/dL (ref 8.9–10.3)
Chloride: 104 mmol/L (ref 98–111)
Chloride: 102 mmol/L (ref 98–111)
Creatinine, Ser: 1.36 mg/dL — ABNORMAL HIGH (ref 0.44–1.00)
Creatinine, Ser: 1.28 mg/dL — ABNORMAL HIGH (ref 0.44–1.00)
GFR, Estimated: 43 mL/min — ABNORMAL LOW (ref >60)
GFR, Estimated: 46 mL/min — ABNORMAL LOW (ref >60)
Glucose, Bld: 279 mg/dL — ABNORMAL HIGH (ref 70–99)
Glucose, Bld: 294 mg/dL — ABNORMAL HIGH (ref 70–99)
Potassium: 4.9 mmol/L (ref 3.5–5.1)
Potassium: 4.4 mmol/L (ref 3.5–5.1)
Sodium: 137 mmol/L (ref 135–145)
Sodium: 135 mmol/L (ref 135–145)

## 2020-09-04 LAB — PROTIME-INR
INR: 1 (ref 0.8–1.2)
Prothrombin Time: 12.9 seconds (ref 11.4–15.2)

## 2020-09-04 LAB — GLUCOSE, CAPILLARY
Glucose-Capillary: 294 mg/dL — ABNORMAL HIGH (ref 70–99)
Glucose-Capillary: 194 mg/dL — ABNORMAL HIGH (ref 70–99)
Glucose-Capillary: 176 mg/dL — ABNORMAL HIGH (ref 70–99)
Glucose-Capillary: 210 mg/dL — ABNORMAL HIGH (ref 70–99)

## 2020-09-04 LAB — HEPARIN LEVEL (UNFRACTIONATED)
Heparin Unfractionated: 0.5 IU/mL (ref 0.30–0.70)
Heparin Unfractionated: 0.49 IU/mL (ref 0.30–0.70)

## 2020-09-04 MED ORDER — SODIUM CHLORIDE 0.9 % WEIGHT BASED INFUSION
3.0000 mL/kg/h | INTRAVENOUS | Status: AC
  Administered 2020-09-05: 3 mL/kg/h via INTRAVENOUS

## 2020-09-04 MED ORDER — MORPHINE SULFATE (PF) 2 MG/ML IV SOLN
2.0000 mg | INTRAVENOUS | Status: DC | PRN
  Administered 2020-09-04 – 2020-09-05 (×3): 2 mg via INTRAVENOUS
  Filled 2020-09-04 (×2): qty 1

## 2020-09-04 MED ORDER — NEPRO/CARBSTEADY PO LIQD: 237.0000 mL | Freq: Two times a day (BID) | ORAL | Status: DC

## 2020-09-04 MED ORDER — INSULIN GLARGINE-YFGN 100 UNIT/ML ~~LOC~~ SOLN
23.0000 [IU] | Freq: Every day | SUBCUTANEOUS | Status: DC
  Administered 2020-09-04: 23 [IU] via SUBCUTANEOUS
  Filled 2020-09-04 (×3): qty 0.23

## 2020-09-04 MED ORDER — MIRTAZAPINE 15 MG PO TABS
30.0000 mg | ORAL_TABLET | Freq: Every day | ORAL | Status: DC
  Administered 2020-09-04: 30 mg via ORAL
  Filled 2020-09-04: qty 2

## 2020-09-04 MED ORDER — SODIUM CHLORIDE 0.9% FLUSH: 3.0000 mL | Freq: Two times a day (BID) | INTRAVENOUS | Status: AC

## 2020-09-04 MED ORDER — SODIUM CHLORIDE 0.9 % WEIGHT BASED INFUSION
1.0000 mL/kg/h | INTRAVENOUS | Status: DC
  Administered 2020-09-05: 1 mL/kg/h via INTRAVENOUS

## 2020-09-04 MED ORDER — ASPIRIN 81 MG PO CHEW
81.0000 mg | CHEWABLE_TABLET | ORAL | Status: AC
  Administered 2020-09-05: 81 mg via ORAL
  Filled 2020-09-04: qty 1

## 2020-09-04 MED ORDER — BREXPIPRAZOLE 1 MG PO TABS
1.0000 mg | ORAL_TABLET | Freq: Every day | ORAL | Status: DC
  Administered 2020-09-04 – 2020-09-05 (×2): 1 mg via ORAL
  Filled 2020-09-04 (×2): qty 1

## 2020-09-04 MED ORDER — INSULIN ASPART 100 UNIT/ML IJ SOLN
3.0000 [IU] | Freq: Three times a day (TID) | INTRAMUSCULAR | Status: DC
  Administered 2020-09-04 (×2): 3 [IU] via SUBCUTANEOUS
  Filled 2020-09-04 (×2): qty 1

## 2020-09-04 MED ORDER — SODIUM CHLORIDE 0.9 % IV SOLN: 250.0000 mL | INTRAVENOUS | Status: DC | PRN

## 2020-09-04 MED ORDER — SODIUM CHLORIDE 0.9% FLUSH: 3.0000 mL | INTRAVENOUS | Status: DC | PRN

## 2020-09-04 MED ORDER — ADULT MULTIVITAMIN W/MINERALS CH: 1.0000 | ORAL_TABLET | Freq: Every day | ORAL | Status: DC

## 2020-09-04 NOTE — Progress Notes (Signed)
SUBJECTIVE: Erica Mercer is a 65 y.o. female with history of hypertension, hyperlipidemia, diabetes, bipolar disorder, here with multiple complaints.  The patient is tearful and somewhat tangential on history taking.  She reports that her sister died approximately a week and a half ago, and since then she has had fatigue, lack of sleep, shortness of breath, and her blood pressure has been elevated.  She has had increasing dull, aching, chest pressure for the last several days as well.  She has had difficulty getting around her house due to this shortness of breath.  She states she has had associated fatigue.  Symptoms are worse with exertion.  Denies any alleviating factors.  No other acute complaints.  No recent medication changes.  Denies drug use.  Patient reports this morning she continues to have chest pain and shortness of breath.    Vitals:   09/04/20 0106 09/04/20 0106 09/04/20 0638 09/04/20 0735  BP: (!) 158/91 (!) 158/91 (!) 175/112 (!) 157/104  Pulse: 74 76 74 81  Resp: '18 18  18  '$ Temp: 98.1 F (36.7 C) 98.1 F (36.7 C) 97.9 F (36.6 C) 97.8 F (36.6 C)  TempSrc:      SpO2: 98% 97% 95% 90%  Weight:      Height:        Intake/Output Summary (Last 24 hours) at 09/04/2020 0813 Last data filed at 09/04/2020 0110 Gross per 24 hour  Intake 1114.95 ml  Output --  Net 1114.95 ml    LABS: Basic Metabolic Panel: Recent Labs    09/03/20 1838 09/04/20 0516  NA 140 137  K 4.0 4.9  CL 106 104  CO2 26 27  GLUCOSE 220* 279*  BUN 45* 47*  CREATININE 1.25* 1.36*  CALCIUM 9.7 9.4   Liver Function Tests: Recent Labs    09/03/20 1035  AST 86*  ALT 55*  ALKPHOS 143*  BILITOT 0.8  PROT 6.6  ALBUMIN 3.2*   No results for input(s): LIPASE, AMYLASE in the last 72 hours. CBC: Recent Labs    09/03/20 1035 09/04/20 0516  WBC 11.1* 18.9*  HGB 9.7* 9.2*  HCT 29.4* 27.2*  MCV 101.7* 100.4*  PLT 318 336   Cardiac Enzymes: No results for input(s): CKTOTAL, CKMB,  CKMBINDEX, TROPONINI in the last 72 hours. BNP: Invalid input(s): POCBNP D-Dimer: No results for input(s): DDIMER in the last 72 hours. Hemoglobin A1C: No results for input(s): HGBA1C in the last 72 hours. Fasting Lipid Panel: No results for input(s): CHOL, HDL, LDLCALC, TRIG, CHOLHDL, LDLDIRECT in the last 72 hours. Thyroid Function Tests: Recent Labs    09/03/20 1320  TSH 1.029   Anemia Panel: No results for input(s): VITAMINB12, FOLATE, FERRITIN, TIBC, IRON, RETICCTPCT in the last 72 hours.   PHYSICAL EXAM General: Well developed, well nourished, in no acute distress HEENT:  Normocephalic and atramatic Neck:  No JVD.  Lungs: Clear bilaterally to auscultation and percussion. Heart: HRRR . Normal S1 and S2 without gallops or murmurs.  Abdomen: Bowel sounds are positive, abdomen soft and non-tender  Msk:  Back normal, normal gait. Normal strength and tone for age. Extremities: No clubbing, cyanosis or edema.   Neuro: Alert and oriented X 3. Psych:  Good affect, responds appropriately  TELEMETRY: NSR, HR 76  ASSESSMENT AND PLAN: Patient troponins 09/03/20 154 then 237. This morning patient reports chest pain and shortness of breath. History of CAD. Will plan for left heart catheterization today. Patient to remain NPO.  Principal Problem:   NSTEMI (non-ST  elevated myocardial infarction) ([place]) Active Problems:   DKA (diabetic ketoacidosis) ([place])   Severe protein-calorie malnutrition ([place])   Essential hypertension   HLD (hyperlipidemia)   DKA, type 1 (HCC)   COPD (chronic obstructive pulmonary disease) (HCC)   Bipolar 1 disorder (HCC)   CAD (coronary artery disease)   Tobacco abuse   Depression   Elevated troponin   Protein-calorie malnutrition, moderate (HCC)   CKD (chronic kidney disease), stage IIIa    Peggye Poon, FNP-C 09/04/2020 8:13 AM

## 2020-09-04 NOTE — Progress Notes (Signed)
Inpatient Diabetes Program Recommendations  AACE/ADA: New Consensus Statement on Inpatient Glycemic Control (2015)  Target Ranges:  Prepandial:   less than 140 mg/dL      Peak postprandial:   less than 180 mg/dL (1-2 hours)      Critically ill patients:  140 - 180 mg/dL  Results for Erica, Mercer (MRN [id number]) as of 09/04/2020 07:58  Ref. Range 09/03/2020 17:13 09/03/2020 18:04 09/03/2020 19:09 09/03/2020 20:15 09/03/2020 21:29 09/03/2020 22:09  Glucose-Capillary Latest Ref Range: 70 - 99 mg/dL 315 (H) 268 (H) 185 (H)  20 units Semglee '@1933'$  150 (H) 270 (H)  3 units NOVOLOG  266 (H)  Results for Erica, Mercer (MRN [id number]) as of 09/04/2020 07:58  Ref. Range 09/04/2020 07:37  Glucose-Capillary Latest Ref Range: 70 - 99 mg/dL 294 (H)  Results for Erica, Mercer (MRN [id number]) as of 09/04/2020 07:58  Ref. Range 02/25/2020 12:45 05/05/2020 12:00 07/30/2020 16:17 08/20/2020 09:30  Hemoglobin A1C Latest Ref Range: 4.8 - 5.6 % 14.4 (H) 13.8 (H) 14.8 (H) 15.4 (H)    Home DM Meds: Lantus 20 units Daily       Humalog 3 units TID with meals  Current Orders: Semglee 20 units QHS Novolog 0-9 units TID ac/hs     Transitioned to SQ yest around 7:30pm.    Well known to the inpatient diabetes team.  This is pt's 7th admission since January 2022 and pt has also had 12 ED visits since January 2022.  Has been counseled before by the diabetes team and continues to have extremely poor glucose control at home.    MD- Note CBG 294 this AM  Please consider:   1. Increase Semglee slightly to 23 units QHS (15% increase)  2. Start Novolog Meal Coverage: Novolog 3 units TID with meals Hold if pt eats <50% of meal, Hold if pt NPO     --Will follow patient during hospitalization--  Wyn Quaker RN, MSN, CDE Diabetes Coordinator Inpatient Glycemic Control Team Team Pager: [phone] (8a-5p)

## 2020-09-04 NOTE — Progress Notes (Signed)
Initial Nutrition Assessment  DOCUMENTATION CODES:   Non-severe (moderate) malnutrition in context of chronic illness  INTERVENTION:   Nepro Shake po BID, each supplement provides 425 kcal and 19 grams protein  MVI po daily   Pt at high refeed risk; recommend monitor potassium, magnesium and phosphorus labs daily until stable  NUTRITION DIAGNOSIS:   Moderate Malnutrition related to chronic illness (COPD) as evidenced by moderate fat depletion, moderate muscle depletion.  GOAL:   Patient will meet greater than or equal to 90% of their needs  MONITOR:   PO intake, Supplement acceptance, Labs, Weight trends, Skin, I & O's  REASON FOR ASSESSMENT:   Consult Assessment of nutrition requirement/status  ASSESSMENT:   65 y.o. female with PMH significant for insulin-dependent DM, gastroparesis, GERD, HTN, COPD, HLD, chronic daily smoking, CAD, bipolar disorder, medication noncompliance and CKD III who is admitted with NSTEMI.  Met with pt in room today. Pt sitting up eating lunch at time of RD visit and has almost finished her entire plate of food. Pt NPO for heart cath most of today. Pt reports that she is starving today as she has been NPO since yesterday. Pt reports that she does enjoy vanilla and butter pecan supplements (chocolate causes diarrhea). RD will add supplements and MVI to help pt meet her estimated needs. Pt reports good oral intake at baseline. Per chart, pt is down 10lbs(10%) over the past 2 months; this is significant weight loss.   Medications reviewed and include: aspirin, pepcid, insulin, remeron  Labs reviewed: K 4.4 wnl, BUN 46(H), creat 1.28(H) Wbc- 17.4(H), Hgb 9.1(L), Hct 27.0(L) Cbgs- 294, 194 x 24 hrs AIC 15.4(H)  NUTRITION - FOCUSED PHYSICAL EXAM:  Flowsheet Row Most Recent Value  Orbital Region Mild depletion  Upper Arm Region Moderate depletion  Thoracic and Lumbar Region Mild depletion  Buccal Region Mild depletion  Temple Region Mild  depletion  Clavicle Bone Region Moderate depletion  Clavicle and Acromion Bone Region Moderate depletion  Scapular Bone Region Moderate depletion  Dorsal Hand Severe depletion  Patellar Region Severe depletion  Anterior Thigh Region Severe depletion  Posterior Calf Region Severe depletion  Edema (RD Assessment) None  Hair Reviewed  Eyes Reviewed  Mouth Reviewed  Skin Reviewed  Nails Reviewed   Diet Order:   Diet Order             Diet NPO time specified  Diet effective midnight           Diet heart healthy/carb modified Room service appropriate? Yes; Fluid consistency: Thin  Diet effective now                  EDUCATION NEEDS:   No education needs have been identified at this time  Skin:  Skin Assessment: Reviewed RN Assessment  Last BM:  PTA  Height:   Ht Readings from Last 1 Encounters:  09/03/20 5' 2"  (1.575 m)    Weight:   Wt Readings from Last 1 Encounters:  09/03/20 43 kg    Ideal Body Weight:  50 kg  BMI:  Body mass index is 17.34 kg/m.  Estimated Nutritional Needs:   Kcal:  1400-1600kcal/day  Protein:  70-80g/day  Fluid:  1.3-1.5L/day  Koleen Distance MS, RD, LDN Please refer to [facility] for RD and/or RD on-call/weekend/after hours pager

## 2020-09-04 NOTE — Progress Notes (Signed)
PROGRESS NOTE  Erica Mercer  DOB: Nov 03, 1955  PCP: [name], NP [id number]  DOA: 09/03/2020  LOS: 1 day  Hospital Day: 2   Chief Complaint  Patient presents with   Shortness of Breath    Brief narrative: Erica Mercer is a 65 y.o. female with PMH significant for insulin-dependent DM, HTN, HLD, chronic daily smoking, history of CAD, bipolar disorder medication noncompliance. Patient presented to the ED on 8/10 with complaint of shortness of breath, chest pressure, dizziness In the ED patient was afebrile, blood pressure was elevated to 162/104, heart rate elevated to 104, oxygen saturation was 83% on room air, improved to more than 90% on 2 L by nasal cannula. Labs with creatinine 1.48, blood glucose levels elevated to 638, troponin was elevated to 154 and further up to 237, hemoglobin low at 9.7  Admitted to hospitalist service. Cardiology consultation was called. See below for details.  Subjective: Patient was seen and examined this morning.  Pleasant elderly African-American female.  Not in distress. Overnight blood pressure has been elevated mostly to 170s  Assessment/Plan: NSTEMI History of CAD, hyperlipidemia -Presented with shortness of breath, chest pressure, elevated troponin -EKG with nonspecific ST-T wave changes -Cardiology consult appreciated. -Started on heparin drip.  She was planned for cardiac cath but apparently got canceled and postponed to tomorrow.  N.p.o. after midnight. -Currently on aspirin 81 mg daily, Coreg 25 mg twice daily, sublingual nitroglycerin as needed Recent Labs    09/03/20 1035 09/03/20 1221  TROPONINIHS 154* 237*   Uncontrolled type 2 diabetes mellitus Hyperglycemia No evidence of DKA -A1c significantly elevated to 15.4 on 08/20/2020. -Home meds include Lantus 20 units daily, Humalog 3 units 3 times daily -Diabetes coordinator consult proceeded. -Blood sugar level is running elevated close to 300 this morning. -We  will increase Lantus to 23 units daily, start on scheduled Premeal regular insulin at 3 units 3 times daily and also continue sliding's insulin with Accu-Cheks. Recent Labs  Lab 09/03/20 2015 09/03/20 2129 09/03/20 2209 09/04/20 0737 09/04/20 1110  GLUCAP 150* 270* 266* 294* 194*   Hypertensive urgency -Her pressure was elevated up to 209/107 in the ED  -History of noncompliance to medications.  -Currently on Coreg 25 mg twice daily, amlodipine 10 mg daily, hydralazine 25 mg 3 times daily.  As needed hydralazine IV.    Moderate to severe protein malnutrition -mirtazapine 15 mg nightly resumed  Diabetic gastroparesis -Continue as needed Reglan, as needed Zofran   Depression/anxiety/grieving -Psychiatry evaluation was ordered by admitting physician.   GERD -Pepcid  Recurrent hospitalization -This is pt's 7th admission since January 2022 and pt has also had 12 ED visits since January 2022.    Mobility: Encourage ambulation.  PT evaluation postprocedure Code Status:   Code Status: Full Code  Nutritional status: Body mass index is 17.34 kg/m.     Diet: Cardiac diet.  N.p.o. after midnight Diet Order             Diet NPO time specified  Diet effective midnight           Diet heart healthy/carb modified Room service appropriate? Yes; Fluid consistency: Thin  Diet effective now                  DVT prophylaxis:  Place TED hose Start: 09/03/20 1253   Antimicrobials: None Fluid: Per cath protocol Consultants: Cardiology Family Communication: None  Status is: Inpatient  Remains inpatient appropriate because: Pending cardiac cath  Dispo: The patient is from:  Home              Anticipated d/c is to: Pending cardiac cath, pending PT eval              Patient currently is not medically stable to d/c.   Difficult to place patient No     Infusions:   sodium chloride     [START ON 09/05/2020] sodium chloride     Followed by   Derrill Memo ON 09/05/2020] sodium  chloride     heparin 700 Units/hr (09/04/20 0001)   lactated ringers Stopped (09/04/20 1303)    Scheduled Meds:  amLODipine  10 mg Oral Daily   [START ON 09/05/2020] aspirin  81 mg Oral Pre-Cath   aspirin EC  81 mg Oral Daily   atorvastatin  40 mg Oral QHS   brexpiprazole  1 mg Oral Daily   carvedilol  25 mg Oral BID WC   famotidine  20 mg Oral QHS   hydrALAZINE  25 mg Oral Q8H   insulin aspart  0-5 Units Subcutaneous QHS   insulin aspart  0-9 Units Subcutaneous TID WC   insulin aspart  3 Units Subcutaneous TID AC   insulin glargine-yfgn  23 Units Subcutaneous QHS   mirtazapine  30 mg Oral QHS   nitroGLYCERIN  1 inch Topical Q8H    Antimicrobials: Anti-infectives (From admission, onward)    None       PRN meds: sodium chloride, acetaminophen **OR** acetaminophen, hydrALAZINE, metoCLOPramide, morphine injection, nitroGLYCERIN, ondansetron (ZOFRAN) IV, sodium chloride flush   Objective: Vitals:   09/04/20 0735 09/04/20 1106  BP: (!) 157/104 (!) 145/84  Pulse: 81 69  Resp: 18 18  Temp: 97.8 F (36.6 C) 98.4 F (36.9 C)  SpO2: 90% 96%    Intake/Output Summary (Last 24 hours) at 09/04/2020 1406 Last data filed at 09/04/2020 0110 Gross per 24 hour  Intake 1014.95 ml  Output --  Net 1014.95 ml   Filed Weights   09/03/20 1032  Weight: 43 kg   Weight change:  Body mass index is 17.34 kg/m.   Physical Exam: General exam: Pleasant, elderly African-American female.  Not in distress Skin: No rashes, lesions or ulcers. HEENT: Atraumatic, normocephalic, no obvious bleeding Lungs: Clear to auscultation bilaterally CVS: Regular rate and rhythm, no murmur GI/Abd soft, nontender, nondistended, bowel sound present CNS: Slow to respond, oriented to place and person Psychiatry: Depressed look Extremities: No pedal edema, no calf tenderness  Data Review: I have personally reviewed the laboratory data and studies available.  Recent Labs  Lab 09/03/20 1035  09/04/20 0516 09/04/20 0924  WBC 11.1* 18.9* 17.4*  HGB 9.7* 9.2* 9.1*  HCT 29.4* 27.2* 27.0*  MCV 101.7* 100.4* 98.9  PLT 318 336 316   Recent Labs  Lab 09/03/20 1035 09/03/20 1838 09/04/20 0516 09/04/20 0924  NA 136 140 137 135  K 4.6 4.0 4.9 4.4  CL 99 106 104 102  CO2 '24 26 27 27  '$ GLUCOSE 638* 220* 279* 294*  BUN 46* 45* 47* 46*  CREATININE 1.48* 1.25* 1.36* 1.28*  CALCIUM 9.4 9.7 9.4 9.4    F/u labs ordered Unresulted Labs (From admission, onward)     Start     Ordered   09/05/20 0500  CBC with Differential/Platelet  Daily,   R     Question:  Specimen collection method  Answer:  Lab=Lab collect   09/04/20 1403   09/05/20 [id number]  Basic metabolic panel  Daily,   R  Question:  Specimen collection method  Answer:  Lab=Lab collect   09/04/20 1403   09/04/20 1600  Heparin level (unfractionated)  Once-Timed,   TIMED       Question:  Specimen collection method  Answer:  Lab=Lab collect   09/04/20 1252            Signed, Terrilee Croak, MD [facility] 09/04/2020

## 2020-09-04 NOTE — Consult Note (Signed)
Erica Mercer Psychiatry Consult   Reason for Consult: Consult for 65 year old woman with a history of bipolar depression or recurrent depression currently in the hospital with complications of diabetes and heart disease Referring Physician:  [place] Patient Identification: Erica Mercer MRN:  [id number] Principal Diagnosis: Bipolar depression ([place]) Diagnosis:  Principal Problem:   Bipolar depression ([place]) Active Problems:   DKA (diabetic ketoacidosis) ([place])   Severe protein-calorie malnutrition ([place])   Essential hypertension   HLD (hyperlipidemia)   DKA, type 1 ([place])   COPD (chronic obstructive pulmonary disease) (HCC)   Bipolar 1 disorder ([place])   NSTEMI (non-ST elevated myocardial infarction) ([place])   CAD (coronary artery disease)   Tobacco abuse   Depression   Elevated troponin   Protein-calorie malnutrition, moderate (HCC)   CKD (chronic kidney disease), stage IIIa   Total Time spent with patient: 1 hour  Subjective:   Erica Mercer is a 65 y.o. female patient admitted with "I have been feeling so bad".  HPI: Patient seen chart reviewed.  65 year old woman presented to the hospital with shortness of breath nausea and vomiting.  Blood sugars running very high although still having some trouble breathing.  Patient however is also complaining of severe depression.  Her sister died about a week ago.  Since that time her mood has been particularly bad.  Sad with tearful affect much of the time.  No energy.  No enjoyment of anything.  Sleeping poorly at night.  Not eating well.  Passive thoughts of wishing she were dead but no suicidal intent or plan.  No hallucinations or psychosis.  Sounds like she has not been able to take care of her basic health needs very well.  Somewhat unclear whether she has been taking all of her medicines regularly.  Patient has acknowledged ongoing cannabis use although she says that she has not used cocaine in 25 years.  Most recently she has been  living by herself at home but is hoping to move back to [place] where she can live with her brother.  Past Psychiatric History: Patient carries a diagnosis of bipolar disorder.  Searching through her old chart I was unable to find any documentation of mania and she did not describe a clear classic mania.  Most recently has been on Remeron 30 mg.  She had been on Rexulti 1 mg before her last hospitalization.  Going through those notes it looks like it was discontinued just by oversight.  Also has a past history of treatment with Latuda.  Risk to Self:   Risk to Others:   Prior Inpatient Therapy:   Prior Outpatient Therapy:    Past Medical History:  Past Medical History:  Diagnosis Date   Bipolar 1 disorder ([place])    CAD (coronary artery disease)    Diabetes mellitus without complication ([place])    HLD (hyperlipidemia)    HTN (hypertension)     Past Surgical History:  Procedure Laterality Date   COLONOSCOPY     ESOPHAGOGASTRODUODENOSCOPY     Family History:  Family History  Problem Relation Age of Onset   Heart disease Mother    Prostate cancer Father    Family Psychiatric  History: She reports several people in her family with substance abuse and mood disorder Social History:  Social History   Substance and Sexual Activity  Alcohol Use Not Currently     Social History   Substance and Sexual Activity  Drug Use Yes   Frequency: 3.0 times per week   Types: Marijuana  Social History   Socioeconomic History   Marital status: Single    Spouse name: Not on file   Number of children: Not on file   Years of education: Not on file   Highest education level: Not on file  Occupational History   Not on file  Tobacco Use   Smoking status: Every Day   Smokeless tobacco: Never  Vaping Use   Vaping Use: Never used  Substance and Sexual Activity   Alcohol use: Not Currently   Drug use: Yes    Frequency: 3.0 times per week    Types: Marijuana   Sexual activity: Not Currently   Other Topics Concern   Not on file  Social History Narrative   Not on file   Social Determinants of Health   Financial Resource Strain: Not on file  Food Insecurity: Not on file  Transportation Needs: Not on file  Physical Activity: Not on file  Stress: Not on file  Social Connections: Not on file   Additional Social History:    Allergies:  No Known Allergies  Labs:  Results for orders placed or performed during the hospital encounter of 09/03/20 (from the past 48 hour(s))  CBC     Status: Abnormal   Collection Time: 09/03/20 10:35 AM  Result Value Ref Range   WBC 11.1 (H) 4.0 - 10.5 K/uL   RBC 2.89 (L) 3.87 - 5.11 MIL/uL   Hemoglobin 9.7 (L) 12.0 - 15.0 g/dL   HCT 29.4 (L) 36.0 - 46.0 %   MCV 101.7 (H) 80.0 - 100.0 fL   MCH 33.6 26.0 - 34.0 pg   MCHC 33.0 30.0 - 36.0 g/dL   RDW 14.6 11.5 - 15.5 %   Platelets 318 150 - 400 K/uL   nRBC 0.0 0.0 - 0.2 %    Comment: Performed at [facility], [address] [place], [place] [id number]  Basic metabolic panel     Status: Abnormal   Collection Time: 09/03/20 10:35 AM  Result Value Ref Range   Sodium 136 135 - 145 mmol/L   Potassium 4.6 3.5 - 5.1 mmol/L   Chloride 99 98 - 111 mmol/L   CO2 24 22 - 32 mmol/L   Glucose, Bld 638 (HH) 70 - 99 mg/dL    Comment: CRITICAL RESULT CALLED TO, READ BACK BY AND VERIFIED WITH KARA BRIDGES AT 1203 09/03/20. GAA Glucose reference range applies only to samples taken after fasting for at least 8 hours.    BUN 46 (H) 8 - 23 mg/dL   Creatinine, Ser 1.48 (H) 0.44 - 1.00 mg/dL   Calcium 9.4 8.9 - 10.3 mg/dL   GFR, Estimated 39 (L) >60 mL/min    Comment: (NOTE) Calculated using the CKD-EPI Creatinine Equation (2021)    Anion gap 13 5 - 15    Comment: Performed at [facility], [address] [place], [place] [address]  Troponin I (High Sensitivity)     Status: Abnormal   Collection Time: 09/03/20 10:35 AM  Result Value Ref Range   Troponin I (High Sensitivity) 154  (HH) <18 ng/L    Comment: CRITICAL RESULT CALLED TO, READ BACK BY AND VERIFIED WITH KARA BRIDGES AT 1139. ON 09/03/20 GAA (NOTE) Elevated high sensitivity troponin I (hsTnI) values and significant  changes across serial measurements may suggest ACS but many other  chronic and acute conditions are known to elevate hsTnI results.  Refer to the Links section for chest pain algorithms and additional  guidance. Performed at  [facility], [address] [place], [place] [address]   Brain natriuretic peptide     Status: Abnormal   Collection Time: 09/03/20 10:35 AM  Result Value Ref Range   B Natriuretic Peptide 1,371.8 (H) 0.0 - 100.0 pg/mL    Comment: Performed at [facility], [address] [place], [place] [address]  Hepatic function panel     Status: Abnormal   Collection Time: 09/03/20 10:35 AM  Result Value Ref Range   Total Protein 6.6 6.5 - 8.1 g/dL   Albumin 3.2 (L) 3.5 - 5.0 g/dL   AST 86 (H) 15 - 41 U/L   ALT 55 (H) 0 - 44 U/L   Alkaline Phosphatase 143 (H) 38 - 126 U/L   Total Bilirubin 0.8 0.3 - 1.2 mg/dL   Bilirubin, Direct 0.1 0.0 - 0.2 mg/dL   Indirect Bilirubin 0.7 0.3 - 0.9 mg/dL    Comment: Performed at [facility], [place], [place] [address]  Troponin I (High Sensitivity)     Status: Abnormal   Collection Time: 09/03/20 12:21 PM  Result Value Ref Range   Troponin I (High Sensitivity) 237 (HH) <18 ng/L    Comment: CRITICAL VALUE NOTED. VALUE IS CONSISTENT WITH PREVIOUSLY REPORTED/CALLED VALUE KLW (NOTE) Elevated high sensitivity troponin I (hsTnI) values and significant  changes across serial measurements may suggest ACS but many other  chronic and acute conditions are known to elevate hsTnI results.  Refer to the "Links" section for chest pain algorithms and additional  guidance. Performed at [facility], [address] [place], [place] [address]   Osmolality     Status: Abnormal   Collection Time:  09/03/20 12:21 PM  Result Value Ref Range   Osmolality 338 (HH) 275 - 295 mOsm/kg    Comment: CRITICAL RESULT CALLED TO, READ BACK BY AND VERIFIED WITH: KARA BRIDGES 09/03/20 [place] at [facility], [address] [place], [place] [address]   CBG monitoring, ED     Status: Abnormal   Collection Time: 09/03/20 12:29 PM  Result Value Ref Range   Glucose-Capillary >600 (HH) 70 - 99 mg/dL    Comment: Glucose reference range applies only to samples taken after fasting for at least 8 hours.  TSH     Status: None   Collection Time: 09/03/20  1:20 PM  Result Value Ref Range   TSH 1.029 0.350 - 4.500 uIU/mL    Comment: Performed by a 3rd Generation assay with a functional sensitivity of <=0.01 uIU/mL. Performed at [facility], [address] [place], [place] [address]   APTT     Status: None   Collection Time: 09/03/20  1:20 PM  Result Value Ref Range   aPTT 25 24 - 36 seconds    Comment: Performed at [facility], [address] [place], [place] [address]  Protime-INR     Status: None   Collection Time: 09/03/20  1:20 PM  Result Value Ref Range   Prothrombin Time 13.5 11.4 - 15.2 seconds   INR 1.0 0.8 - 1.2    Comment: (NOTE) INR goal varies based on device and disease states. Performed at [facility], [address] [place], [place] [address]   CBG monitoring, ED     Status: Abnormal   Collection Time: 09/03/20  1:49 PM  Result Value Ref Range   Glucose-Capillary 525 (HH) 70 - 99 mg/dL    Comment: Glucose reference range applies only to  samples taken after fasting for at least 8 hours.   Comment 1 Call MD NNP PA CNM   Urine Drug Screen, Qualitative ([place] only)     Status: Abnormal   Collection Time: 09/03/20  2:15 PM  Result Value Ref Range   Tricyclic, Ur Screen NONE DETECTED NONE DETECTED   Amphetamines, Ur Screen NONE DETECTED NONE DETECTED   MDMA (Ecstasy)Ur Screen NONE DETECTED NONE DETECTED   Cocaine Metabolite,Ur  [place] NONE DETECTED NONE DETECTED   Opiate, Ur Screen NONE DETECTED NONE DETECTED   Phencyclidine (PCP) Ur S NONE DETECTED NONE DETECTED   Cannabinoid 50 Ng, Ur [place] POSITIVE (A) NONE DETECTED   Barbiturates, Ur Screen NONE DETECTED NONE DETECTED   Benzodiazepine, Ur Scrn NONE DETECTED NONE DETECTED   Methadone Scn, Ur NONE DETECTED NONE DETECTED    Comment: (NOTE) Tricyclics + metabolites, urine    Cutoff 1000 ng/mL Amphetamines + metabolites, urine  Cutoff 1000 ng/mL MDMA (Ecstasy), urine              Cutoff 500 ng/mL Cocaine Metabolite, urine          Cutoff 300 ng/mL Opiate + metabolites, urine        Cutoff 300 ng/mL Phencyclidine (PCP), urine         Cutoff 25 ng/mL Cannabinoid, urine                 Cutoff 50 ng/mL Barbiturates + metabolites, urine  Cutoff 200 ng/mL Benzodiazepine, urine              Cutoff 200 ng/mL Methadone, urine                   Cutoff 300 ng/mL  The urine drug screen provides only a preliminary, unconfirmed analytical test result and should not be used for non-medical purposes. Clinical consideration and professional judgment should be applied to any positive drug screen result due to possible interfering substances. A more specific alternate chemical method must be used in order to obtain a confirmed analytical result. Gas chromatography / mass spectrometry (GC/MS) is the preferred confirm atory method. Performed at [facility], [place], [place] [address]   SARS CORONAVIRUS 2 (TAT 6-24 HRS) Nasopharyngeal Nasopharyngeal Swab     Status: None   Collection Time: 09/03/20  2:15 PM   Specimen: Nasopharyngeal Swab  Result Value Ref Range   SARS Coronavirus 2 NEGATIVE NEGATIVE    Comment: (NOTE) SARS-CoV-2 target nucleic acids are NOT DETECTED.  The SARS-CoV-2 RNA is generally detectable in upper and lower respiratory specimens during the acute phase of infection. Negative results do not preclude SARS-CoV-2 infection, do not rule  out co-infections with other pathogens, and should not be used as the sole basis for treatment or other patient management decisions. Negative results must be combined with clinical observations, patient history, and epidemiological information. The expected result is Negative.  Fact Sheet for Patients: SugarRoll.be  Fact Sheet for Healthcare Providers: https://www.woods-mathews.com/  This test is not yet approved or cleared by the [place] FDA and  has been authorized for detection and/or diagnosis of SARS-CoV-2 by FDA under an Emergency Use Authorization (EUA). This EUA will remain  in effect (meaning this test can be used) for the duration of the COVID-19 declaration under Se ction 564(b)(1) of the Act, 21 U.S.C. section 360bbb-3(b)(1), unless the authorization is terminated or revoked sooner.  Performed at [facility], [place] [address], [place], [place] [address]   CBG  monitoring, ED     Status: Abnormal   Collection Time: 09/03/20  2:51 PM  Result Value Ref Range   Glucose-Capillary 442 (H) 70 - 99 mg/dL    Comment: Glucose reference range applies only to samples taken after fasting for at least 8 hours.  CBG monitoring, ED     Status: Abnormal   Collection Time: 09/03/20  3:45 PM  Result Value Ref Range   Glucose-Capillary 407 (H) 70 - 99 mg/dL    Comment: Glucose reference range applies only to samples taken after fasting for at least 8 hours.  CBG monitoring, ED     Status: Abnormal   Collection Time: 09/03/20  4:14 PM  Result Value Ref Range   Glucose-Capillary 387 (H) 70 - 99 mg/dL    Comment: Glucose reference range applies only to samples taken after fasting for at least 8 hours.  CBG monitoring, ED     Status: Abnormal   Collection Time: 09/03/20  5:13 PM  Result Value Ref Range   Glucose-Capillary 315 (H) 70 - 99 mg/dL    Comment: Glucose reference range applies only to samples taken after fasting for at least  8 hours.  CBG monitoring, ED     Status: Abnormal   Collection Time: 09/03/20  6:04 PM  Result Value Ref Range   Glucose-Capillary 268 (H) 70 - 99 mg/dL    Comment: Glucose reference range applies only to samples taken after fasting for at least 8 hours.  Basic metabolic panel     Status: Abnormal   Collection Time: 09/03/20  6:38 PM  Result Value Ref Range   Sodium 140 135 - 145 mmol/L   Potassium 4.0 3.5 - 5.1 mmol/L   Chloride 106 98 - 111 mmol/L   CO2 26 22 - 32 mmol/L   Glucose, Bld 220 (H) 70 - 99 mg/dL    Comment: Glucose reference range applies only to samples taken after fasting for at least 8 hours.   BUN 45 (H) 8 - 23 mg/dL   Creatinine, Ser 1.25 (H) 0.44 - 1.00 mg/dL   Calcium 9.7 8.9 - 10.3 mg/dL   GFR, Estimated 48 (L) >60 mL/min    Comment: (NOTE) Calculated using the CKD-EPI Creatinine Equation (2021)    Anion gap 8 5 - 15    Comment: Performed at [facility], [address] [place],  [address]  CBG monitoring, ED     Status: Abnormal   Collection Time: 09/03/20  7:09 PM  Result Value Ref Range   Glucose-Capillary 185 (H) 70 - 99 mg/dL    Comment: Glucose reference range applies only to samples taken after fasting for at least 8 hours.   Comment 1 Document in Chart   CBG monitoring, ED     Status: Abnormal   Collection Time: 09/03/20  8:15 PM  Result Value Ref Range   Glucose-Capillary 150 (H) 70 - 99 mg/dL    Comment: Glucose reference range applies only to samples taken after fasting for at least 8 hours.  CBG monitoring, ED     Status: Abnormal   Collection Time: 09/03/20  9:29 PM  Result Value Ref Range   Glucose-Capillary 270 (H) 70 - 99 mg/dL    Comment: Glucose reference range applies only to samples taken after fasting for at least 8 hours.  CBG monitoring, ED     Status: Abnormal   Collection Time: 09/03/20 10:09 PM  Result Value Ref Range   Glucose-Capillary 266 (H) 70 -  99 mg/dL    Comment: Glucose reference range applies only  to samples taken after fasting for at least 8 hours.  Heparin level (unfractionated)     Status: Abnormal   Collection Time: 09/03/20 10:14 PM  Result Value Ref Range   Heparin Unfractionated 0.18 (L) 0.30 - 0.70 IU/mL    Comment: (NOTE) The clinical reportable range upper limit is being lowered to >1.10 to align with the FDA approved guidance for the current laboratory assay.  If heparin results are below expected values, and patient dosage has  been confirmed, suggest follow up testing of antithrombin III levels. Performed at [facility], [address] [place], [place] [address]   Beta-hydroxybutyric acid     Status: None   Collection Time: 09/03/20 10:14 PM  Result Value Ref Range   Beta-Hydroxybutyric Acid 0.13 0.05 - 0.27 mmol/L    Comment: Performed at [facility], [address] [place], [place] [address]  Urinalysis, Complete w Microscopic Urine, Random     Status: Abnormal   Collection Time: 09/03/20 10:14 PM  Result Value Ref Range   Color, Urine YELLOW (A) YELLOW   APPearance HAZY (A) CLEAR   Specific Gravity, Urine 1.011 1.005 - 1.030   pH 5.0 5.0 - 8.0   Glucose, UA >=500 (A) NEGATIVE mg/dL   Hgb urine dipstick NEGATIVE NEGATIVE   Bilirubin Urine NEGATIVE NEGATIVE   Ketones, ur NEGATIVE NEGATIVE mg/dL   Protein, ur 100 (A) NEGATIVE mg/dL   Nitrite NEGATIVE NEGATIVE   Leukocytes,Ua NEGATIVE NEGATIVE   RBC / HPF 0-5 0 - 5 RBC/hpf   WBC, UA 0-5 0 - 5 WBC/hpf   Bacteria, UA RARE (A) NONE SEEN   Squamous Epithelial / LPF 0-5 0 - 5   Mucus PRESENT     Comment: Performed at [facility], [address], [place], [place] [id number]  Basic metabolic panel     Status: Abnormal   Collection Time: 09/04/20  5:16 AM  Result Value Ref Range   Sodium 137 135 - 145 mmol/L   Potassium 4.9 3.5 - 5.1 mmol/L   Chloride 104 98 - 111 mmol/L   CO2 27 22 - 32 mmol/L   Glucose, Bld 279 (H) 70 - 99 mg/dL    Comment: Glucose reference range applies  only to samples taken after fasting for at least 8 hours.   BUN 47 (H) 8 - 23 mg/dL   Creatinine, Ser 1.36 (H) 0.44 - 1.00 mg/dL   Calcium 9.4 8.9 - 10.3 mg/dL   GFR, Estimated 43 (L) >60 mL/min    Comment: (NOTE) Calculated using the CKD-EPI Creatinine Equation (2021)    Anion gap 6 5 - 15    Comment: Performed at [facility], [address] [place], [place] [address]  CBC     Status: Abnormal   Collection Time: 09/04/20  5:16 AM  Result Value Ref Range   WBC 18.9 (H) 4.0 - 10.5 K/uL   RBC 2.71 (L) 3.87 - 5.11 MIL/uL   Hemoglobin 9.2 (L) 12.0 - 15.0 g/dL   HCT 27.2 (L) 36.0 - 46.0 %   MCV 100.4 (H) 80.0 - 100.0 fL   MCH 33.9 26.0 - 34.0 pg   MCHC 33.8 30.0 - 36.0 g/dL   RDW 14.6 11.5 - 15.5 %   Platelets 336 150 - 400 K/uL   nRBC 0.0 0.0 - 0.2 %    Comment: Performed at [facility], [place], [place]  [address]  Glucose, capillary     Status: Abnormal   Collection Time: 09/04/20  7:37 AM  Result Value Ref Range   Glucose-Capillary 294 (H) 70 - 99 mg/dL    Comment: Glucose reference range applies only to samples taken after fasting for at least 8 hours.  Heparin level (unfractionated)     Status: None   Collection Time: 09/04/20  8:16 AM  Result Value Ref Range   Heparin Unfractionated 0.50 0.30 - 0.70 IU/mL    Comment: (NOTE) The clinical reportable range upper limit is being lowered to >1.10 to align with the FDA approved guidance for the current laboratory assay.  If heparin results are below expected values, and patient dosage has  been confirmed, suggest follow up testing of antithrombin III levels. Performed at [facility], [address] [place], [place] [id number]   Basic metabolic panel     Status: Abnormal   Collection Time: 09/04/20  9:24 AM  Result Value Ref Range   Sodium 135 135 - 145 mmol/L   Potassium 4.4 3.5 - 5.1 mmol/L   Chloride 102 98 - 111 mmol/L   CO2 27 22 - 32 mmol/L   Glucose, Bld 294 (H) 70 -  99 mg/dL    Comment: Glucose reference range applies only to samples taken after fasting for at least 8 hours.   BUN 46 (H) 8 - 23 mg/dL   Creatinine, Ser 1.28 (H) 0.44 - 1.00 mg/dL   Calcium 9.4 8.9 - 10.3 mg/dL   GFR, Estimated 46 (L) >60 mL/min    Comment: (NOTE) Calculated using the CKD-EPI Creatinine Equation (2021)    Anion gap 6 5 - 15    Comment: Performed at [facility], [address] [place], [place] [address]  Protime-INR     Status: None   Collection Time: 09/04/20  9:24 AM  Result Value Ref Range   Prothrombin Time 12.9 11.4 - 15.2 seconds   INR 1.0 0.8 - 1.2    Comment: (NOTE) INR goal varies based on device and disease states. Performed at [facility], [address] [place], [place] [address]   CBC     Status: Abnormal   Collection Time: 09/04/20  9:24 AM  Result Value Ref Range   WBC 17.4 (H) 4.0 - 10.5 K/uL   RBC 2.73 (L) 3.87 - 5.11 MIL/uL   Hemoglobin 9.1 (L) 12.0 - 15.0 g/dL   HCT 27.0 (L) 36.0 - 46.0 %   MCV 98.9 80.0 - 100.0 fL   MCH 33.3 26.0 - 34.0 pg   MCHC 33.7 30.0 - 36.0 g/dL   RDW 14.9 11.5 - 15.5 %   Platelets 316 150 - 400 K/uL   nRBC 0.0 0.0 - 0.2 %    Comment: Performed at [facility], [address] [place], [place] [address]  Glucose, capillary     Status: Abnormal   Collection Time: 09/04/20 11:10 AM  Result Value Ref Range   Glucose-Capillary 194 (H) 70 - 99 mg/dL    Comment: Glucose reference range applies only to samples taken after fasting for at least 8 hours.    Current Facility-Administered Medications  Medication Dose Route Frequency Provider Last Rate Last Admin   0.9 %  sodium chloride infusion  250 mL Intravenous PRN Dionisio David, MD       [START ON 09/05/2020] 0.9% sodium chloride infusion  3 mL/kg/hr Intravenous Continuous Dionisio David, MD       Followed  by   Derrill Memo ON 09/05/2020] 0.9% sodium chloride infusion  1 mL/kg/hr Intravenous Continuous Dionisio David, MD        acetaminophen (TYLENOL) tablet 650 mg  650 mg Oral Q6H PRN Cox, Amy N, DO       Or   acetaminophen (TYLENOL) suppository 650 mg  650 mg Rectal Q6H PRN Cox, Amy N, DO       amLODipine (NORVASC) tablet 10 mg  10 mg Oral Daily Cox, Amy N, DO   10 mg at 09/04/20 0915   [START ON 09/05/2020] aspirin chewable tablet 81 mg  81 mg Oral Harless Nakayama, MD       aspirin EC tablet 81 mg  81 mg Oral Daily Cox, Amy N, DO   81 mg at 09/04/20 0915   atorvastatin (LIPITOR) tablet 40 mg  40 mg Oral QHS Cox, Amy N, DO   40 mg at 09/03/20 2139   brexpiprazole (REXULTI) tablet 1 mg  1 mg Oral Daily Phaedra Colgate, Madie Reno, MD       carvedilol (COREG) tablet 25 mg  25 mg Oral BID WC Cox, Amy N, DO   25 mg at 09/04/20 0915   famotidine (PEPCID) tablet 20 mg  20 mg Oral QHS Cox, Amy N, DO   20 mg at 09/03/20 2139   heparin ADULT infusion 100 units/mL (25000 units/256m)  700 Units/hr Intravenous Continuous BRenda Rolls RPH 7 mL/hr at 09/04/20 0001 700 Units/hr at 09/04/20 0001   hydrALAZINE (APRESOLINE) injection 5 mg  5 mg Intravenous Q6H PRN Cox, Amy N, DO       hydrALAZINE (APRESOLINE) tablet 25 mg  25 mg Oral Q8H Cox, Amy N, DO   25 mg at 09/04/20 1303   insulin aspart (novoLOG) injection 0-5 Units  0-5 Units Subcutaneous QHS Cox, Amy N, DO   3 Units at 09/03/20 2145   insulin aspart (novoLOG) injection 0-9 Units  0-9 Units Subcutaneous TID WC Cox, Amy N, DO   2 Units at 09/04/20 1302   insulin aspart (novoLOG) injection 3 Units  3 Units Subcutaneous TID AC Dahal, BMarlowe Aschoff MD   3 Units at 09/04/20 1302   insulin glargine-yfgn (SEMGLEE) injection 23 Units  23 Units Subcutaneous QHS Dahal, BMarlowe Aschoff MD       lactated ringers infusion   Intravenous Continuous Cox, Amy N, DO   Stopped at 09/04/20 1303   metoCLOPramide (REGLAN) tablet 10 mg  10 mg Oral Q6H PRN Cox, Amy N, DO       mirtazapine (REMERON) tablet 30 mg  30 mg Oral QHS Eladio Dentremont T, MD       morphine 2 MG/ML injection 0.5 mg  0.5 mg Intravenous Q4H PRN  Cox, Amy N, DO   0.5 mg at 09/03/20 2313   nitroGLYCERIN (NITROGLYN) 2 % ointment 1 inch  1 inch Topical Q8H Cox, Amy N, DO   1 inch at 09/04/20 1303   nitroGLYCERIN (NITROSTAT) SL tablet 0.4 mg  0.4 mg Sublingual Q5 min PRN Cox, Amy N, DO       ondansetron (ZOFRAN) injection 4 mg  4 mg Intravenous Q6H PRN Cox, Amy N, DO   4 mg at 09/03/20 2313   sodium chloride flush (NS) 0.9 % injection 3 mL  3 mL Intravenous PRN KDionisio David MD       Facility-Administered Medications Ordered in Other Encounters  Medication Dose Route Frequency Provider Last Rate Last Admin   sodium chloride flush (NS) 0.9 %  injection 3 mL  3 mL Intravenous Q12H Dionisio David, MD        Musculoskeletal: Strength & Muscle Tone: within normal limits Gait & Station: normal Patient leans: N/A            Psychiatric Specialty Exam:  Presentation  General Appearance:  No data recorded Eye Contact: No data recorded Speech: No data recorded Speech Volume: No data recorded Handedness: No data recorded  Mood and Affect  Mood: No data recorded Affect: No data recorded  Thought Process  Thought Processes: No data recorded Descriptions of Associations:No data recorded Orientation:No data recorded Thought Content:No data recorded History of Schizophrenia/Schizoaffective disorder:No data recorded Duration of Psychotic Symptoms:No data recorded Hallucinations:No data recorded Ideas of Reference:No data recorded Suicidal Thoughts:No data recorded Homicidal Thoughts:No data recorded  Sensorium  Memory: No data recorded Judgment: No data recorded Insight: No data recorded  Executive Functions  Concentration: No data recorded Attention Span: No data recorded Recall: No data recorded Fund of Knowledge: No data recorded Language: No data recorded  Psychomotor Activity  Psychomotor Activity: No data recorded  Assets  Assets: No data recorded  Sleep  Sleep: No data  recorded  Physical Exam: Physical Exam Vitals and nursing note reviewed.  Constitutional:      Appearance: Normal appearance.  HENT:     Head: Normocephalic and atraumatic.     Mouth/Throat:     Pharynx: Oropharynx is clear.  Eyes:     Pupils: Pupils are equal, round, and reactive to light.  Cardiovascular:     Rate and Rhythm: Normal rate and regular rhythm.  Pulmonary:     Effort: Pulmonary effort is normal.     Breath sounds: Normal breath sounds.  Abdominal:     General: Abdomen is flat.     Palpations: Abdomen is soft.  Musculoskeletal:        General: Normal range of motion.  Skin:    General: Skin is warm and dry.  Neurological:     General: No focal deficit present.     Mental Status: She is alert. Mental status is at baseline.  Psychiatric:        Attention and Perception: Attention normal.        Mood and Affect: Mood is depressed. Affect is tearful.        Speech: Speech is delayed.        Behavior: Behavior is slowed.        Thought Content: Thought content normal. Thought content is not delusional. Thought content does not include homicidal or suicidal ideation.   Review of Systems  Constitutional: Negative.   HENT: Negative.    Eyes: Negative.   Respiratory: Negative.    Cardiovascular: Negative.   Gastrointestinal: Negative.   Musculoskeletal: Negative.   Skin: Negative.   Neurological: Negative.   Psychiatric/Behavioral:  Positive for depression and substance abuse. Negative for hallucinations and suicidal ideas. The patient is nervous/anxious and has insomnia.   Blood pressure (!) 145/84, pulse 69, temperature 98.4 F (36.9 C), temperature source Oral, resp. rate 18, height '5\' 2"'$  (1.575 m), weight 43 kg, SpO2 96 %. Body mass index is 17.34 kg/m.  Treatment Plan Summary: Plan reviewed medication with patient.  There does not seem to have been a clear medical reason for stopping the Rexulti.  She said that that medicine has helped her a lot.  We will  restart 1 mg of Rexulti.  Also increased the mirtazapine to the previous and more effective dose of  30 mg.  Supportive counseling and encouragement to the patient.  She agrees to the plan.  Will follow up as needed.  If she [facility] she seems to have a good plan for outpatient mental health treatment.  Disposition: No evidence of imminent risk to self or others at present.   Patient does not meet criteria for psychiatric inpatient admission. Supportive therapy provided about ongoing stressors. Discussed crisis plan, support from social network, calling 911, coming to the Emergency Department, and calling Suicide Hotline.  Alethia Berthold, MD 09/04/2020 2:14 PM

## 2020-09-04 NOTE — Consult Note (Signed)
[facility] for IV Heparin Indication: chest pain/ACS  Patient Measurements: Heparin Dosing Weight: 43 kg  Labs: Recent Labs    09/03/20 1035 09/03/20 1221 09/03/20 1320 09/03/20 1838 09/03/20 2214 09/04/20 0516 09/04/20 0816 09/04/20 0924  HGB 9.7*  --   --   --   --  9.2*  --  9.1*  HCT 29.4*  --   --   --   --  27.2*  --  27.0*  PLT 318  --   --   --   --  336  --  316  APTT  --   --  25  --   --   --   --   --   LABPROT  --   --  13.5  --   --   --   --  12.9  INR  --   --  1.0  --   --   --   --  1.0  HEPARINUNFRC  --   --   --   --  0.18*  --  0.50  --   CREATININE 1.48*  --   --  1.25*  --  1.36*  --  1.28*  TROPONINIHS 154* 237*  --   --   --   --   --   --      Estimated Creatinine Clearance: 29.7 mL/min (A) (by C-G formula based on SCr of 1.28 mg/dL (H)).   Medical History: Past Medical History:  Diagnosis Date   Bipolar 1 disorder ([place])    CAD (coronary artery disease)    Diabetes mellitus without complication ([place])    HLD (hyperlipidemia)    HTN (hypertension)     Medications:  No anticoagulation prior to admission per my chart review. Medication reconciliation is pending  Assessment: Patient is a 65 y/o F with medical history as above and including CAD / NSTEMI, cocaine abuse who presented to the ED with SOB / hyperglycemia / hypertensive emergency. Troponin elevated to 154. Pharmacy consulted for IV heparin for ACS.  Baseline CBC notable for Hgb 9.7 (appears c/w BL). Baseline aPTT and PT-INR are pending.  Goal of Therapy:  Heparin level 0.3-0.7 units/ml Monitor platelets by anticoagulation protocol: Yes  08/10 2214 HL 0.18, subtherapeutic 08/11 0816 HL 0.50, therapeutic x1   Plan:  --08/11 0816 HL 0.50, therapeutic x1 --Continue continuous infusion rate of 700 units/hr --Check confirmatory HL 8 hr after rate change --Daily CBC per protocol while on IV heparin  Pearla Dubonnet, PharmD Clinical  Pharmacist 09/04/2020 12:52 PM

## 2020-09-04 NOTE — Consult Note (Signed)
ANTICOAGULATION CONSULT NOTE  Pharmacy Consult for IV Heparin Indication: chest pain/ACS  Patient Measurements: Heparin Dosing Weight: 43 kg  Labs: Recent Labs    09/03/20 1035 09/03/20 1221 09/03/20 1320 09/03/20 1838 09/03/20 2214 09/04/20 0516 09/04/20 0816 09/04/20 0924 09/04/20 1615  HGB 9.7*  --   --   --   --  9.2*  --  9.1*  --   HCT 29.4*  --   --   --   --  27.2*  --  27.0*  --   PLT 318  --   --   --   --  336  --  316  --   APTT  --   --  25  --   --   --   --   --   --   LABPROT  --   --  13.5  --   --   --   --  12.9  --   INR  --   --  1.0  --   --   --   --  1.0  --   HEPARINUNFRC  --   --   --   --  0.18*  --  0.50  --  0.49  CREATININE 1.48*  --   --  1.25*  --  1.36*  --  1.28*  --   TROPONINIHS 154* 237*  --   --   --   --   --   --   --      Estimated Creatinine Clearance: 29.7 mL/min (A) (by C-G formula based on SCr of 1.28 mg/dL (H)).   Medical History: Past Medical History:  Diagnosis Date   Bipolar 1 disorder ([place])    CAD (coronary artery disease)    Diabetes mellitus without complication ([place])    HLD (hyperlipidemia)    HTN (hypertension)     Medications:  No anticoagulation prior to admission per my chart review. Medication reconciliation is pending  Assessment: Patient is a 65 y/o F with medical history as above and including CAD / NSTEMI, cocaine abuse who presented to the ED with SOB / hyperglycemia / hypertensive emergency. Troponin elevated to 154. Pharmacy consulted for IV heparin for ACS.  Baseline CBC notable for Hgb 9.7 (appears c/w BL). Baseline aPTT and PT-INR are pending.  Goal of Therapy:  Heparin level 0.3-0.7 units/ml Monitor platelets by anticoagulation protocol: Yes  08/10 2214 HL 0.18, subtherapeutic 08/11 0816 HL 0.50, therapeutic x1 @ 700 units/hr 08/11 1615 HL 0.49, therapeutic x 2    Plan:  --08/11 1615 HL 0.49, therapeutic x 2  --Continue continuous infusion rate of 700 units/hr --Check HL with AM  labs --Daily CBC per protocol while on IV heparin  Dorothe Pea, PharmD, BCPS Clinical Pharmacist 09/04/2020 5:28 PM

## 2020-09-05 ENCOUNTER — Inpatient Hospital Stay (HOSPITAL_COMMUNITY)
Admission: AD | Admit: 2020-09-05 | Discharge: 2020-09-18 | DRG: 246 | Disposition: A | Discharge status: Home Health | Payer: Medicare Other | Source: Other Acute Inpatient Hospital | Attending: Internal Medicine | Admitting: Internal Medicine

## 2020-09-05 ENCOUNTER — Encounter
Admission: EM | Disposition: A | Discharge status: Other Acute Inpatient Hospital | Payer: Self-pay | Source: Home / Self Care | Attending: Internal Medicine

## 2020-09-05 ENCOUNTER — Other Ambulatory Visit (HOSPITAL_COMMUNITY): Payer: Self-pay | Admitting: *Deleted

## 2020-09-05 ENCOUNTER — Other Ambulatory Visit: Payer: Self-pay

## 2020-09-05 DIAGNOSIS — Z8042 Family history of malignant neoplasm of prostate: Secondary | ICD-10-CM

## 2020-09-05 DIAGNOSIS — D638 Anemia in other chronic diseases classified elsewhere: Secondary | ICD-10-CM | POA: Diagnosis present

## 2020-09-05 DIAGNOSIS — I2542 Coronary artery dissection: Secondary | ICD-10-CM | POA: Diagnosis present

## 2020-09-05 DIAGNOSIS — Z86010 Personal history of colonic polyps: Secondary | ICD-10-CM

## 2020-09-05 DIAGNOSIS — E43 Unspecified severe protein-calorie malnutrition: Secondary | ICD-10-CM | POA: Diagnosis present

## 2020-09-05 DIAGNOSIS — I161 Hypertensive emergency: Secondary | ICD-10-CM | POA: Diagnosis present

## 2020-09-05 DIAGNOSIS — I2510 Atherosclerotic heart disease of native coronary artery without angina pectoris: Secondary | ICD-10-CM | POA: Diagnosis not present

## 2020-09-05 DIAGNOSIS — J9601 Acute respiratory failure with hypoxia: Secondary | ICD-10-CM | POA: Diagnosis not present

## 2020-09-05 DIAGNOSIS — E1122 Type 2 diabetes mellitus with diabetic chronic kidney disease: Secondary | ICD-10-CM | POA: Diagnosis present

## 2020-09-05 DIAGNOSIS — Y92239 Unspecified place in hospital as the place of occurrence of the external cause: Secondary | ICD-10-CM | POA: Diagnosis not present

## 2020-09-05 DIAGNOSIS — D631 Anemia in chronic kidney disease: Secondary | ICD-10-CM | POA: Diagnosis present

## 2020-09-05 DIAGNOSIS — E1140 Type 2 diabetes mellitus with diabetic neuropathy, unspecified: Secondary | ICD-10-CM | POA: Diagnosis present

## 2020-09-05 DIAGNOSIS — E1129 Type 2 diabetes mellitus with other diabetic kidney complication: Secondary | ICD-10-CM | POA: Diagnosis present

## 2020-09-05 DIAGNOSIS — D539 Nutritional anemia, unspecified: Secondary | ICD-10-CM | POA: Diagnosis present

## 2020-09-05 DIAGNOSIS — F17210 Nicotine dependence, cigarettes, uncomplicated: Secondary | ICD-10-CM | POA: Diagnosis present

## 2020-09-05 DIAGNOSIS — Z20822 Contact with and (suspected) exposure to covid-19: Secondary | ICD-10-CM | POA: Diagnosis present

## 2020-09-05 DIAGNOSIS — K642 Third degree hemorrhoids: Secondary | ICD-10-CM

## 2020-09-05 DIAGNOSIS — I214 Non-ST elevation (NSTEMI) myocardial infarction: Principal | ICD-10-CM | POA: Diagnosis present

## 2020-09-05 DIAGNOSIS — D500 Iron deficiency anemia secondary to blood loss (chronic): Secondary | ICD-10-CM | POA: Diagnosis not present

## 2020-09-05 DIAGNOSIS — J449 Chronic obstructive pulmonary disease, unspecified: Secondary | ICD-10-CM | POA: Diagnosis present

## 2020-09-05 DIAGNOSIS — K921 Melena: Secondary | ICD-10-CM | POA: Diagnosis not present

## 2020-09-05 DIAGNOSIS — E782 Mixed hyperlipidemia: Secondary | ICD-10-CM | POA: Diagnosis not present

## 2020-09-05 DIAGNOSIS — R739 Hyperglycemia, unspecified: Secondary | ICD-10-CM | POA: Diagnosis not present

## 2020-09-05 DIAGNOSIS — N179 Acute kidney failure, unspecified: Secondary | ICD-10-CM | POA: Diagnosis present

## 2020-09-05 DIAGNOSIS — Z681 Body mass index (BMI) 19 or less, adult: Secondary | ICD-10-CM | POA: Diagnosis not present

## 2020-09-05 DIAGNOSIS — I959 Hypotension, unspecified: Secondary | ICD-10-CM | POA: Diagnosis not present

## 2020-09-05 DIAGNOSIS — I5031 Acute diastolic (congestive) heart failure: Secondary | ICD-10-CM | POA: Diagnosis present

## 2020-09-05 DIAGNOSIS — R7401 Elevation of levels of liver transaminase levels: Secondary | ICD-10-CM | POA: Diagnosis present

## 2020-09-05 DIAGNOSIS — R627 Adult failure to thrive: Secondary | ICD-10-CM | POA: Diagnosis present

## 2020-09-05 DIAGNOSIS — F319 Bipolar disorder, unspecified: Secondary | ICD-10-CM | POA: Diagnosis present

## 2020-09-05 DIAGNOSIS — Z79899 Other long term (current) drug therapy: Secondary | ICD-10-CM

## 2020-09-05 DIAGNOSIS — E11649 Type 2 diabetes mellitus with hypoglycemia without coma: Secondary | ICD-10-CM | POA: Diagnosis present

## 2020-09-05 DIAGNOSIS — E785 Hyperlipidemia, unspecified: Secondary | ICD-10-CM | POA: Diagnosis present

## 2020-09-05 DIAGNOSIS — R32 Unspecified urinary incontinence: Secondary | ICD-10-CM | POA: Diagnosis not present

## 2020-09-05 DIAGNOSIS — I2584 Coronary atherosclerosis due to calcified coronary lesion: Secondary | ICD-10-CM | POA: Diagnosis present

## 2020-09-05 DIAGNOSIS — E1165 Type 2 diabetes mellitus with hyperglycemia: Secondary | ICD-10-CM | POA: Diagnosis not present

## 2020-09-05 DIAGNOSIS — I10 Essential (primary) hypertension: Secondary | ICD-10-CM | POA: Diagnosis not present

## 2020-09-05 DIAGNOSIS — Z841 Family history of disorders of kidney and ureter: Secondary | ICD-10-CM

## 2020-09-05 DIAGNOSIS — I130 Hypertensive heart and chronic kidney disease with heart failure and stage 1 through stage 4 chronic kidney disease, or unspecified chronic kidney disease: Secondary | ICD-10-CM | POA: Diagnosis present

## 2020-09-05 DIAGNOSIS — R7989 Other specified abnormal findings of blood chemistry: Secondary | ICD-10-CM | POA: Diagnosis present

## 2020-09-05 DIAGNOSIS — T380X5A Adverse effect of glucocorticoids and synthetic analogues, initial encounter: Secondary | ICD-10-CM | POA: Diagnosis not present

## 2020-09-05 DIAGNOSIS — Z9119 Patient's noncompliance with other medical treatment and regimen: Secondary | ICD-10-CM

## 2020-09-05 DIAGNOSIS — Z716 Tobacco abuse counseling: Secondary | ICD-10-CM

## 2020-09-05 DIAGNOSIS — R5381 Other malaise: Secondary | ICD-10-CM | POA: Diagnosis present

## 2020-09-05 DIAGNOSIS — R0600 Dyspnea, unspecified: Secondary | ICD-10-CM

## 2020-09-05 DIAGNOSIS — Z8249 Family history of ischemic heart disease and other diseases of the circulatory system: Secondary | ICD-10-CM

## 2020-09-05 DIAGNOSIS — N1831 Chronic kidney disease, stage 3a: Secondary | ICD-10-CM | POA: Diagnosis not present

## 2020-09-05 DIAGNOSIS — N1832 Chronic kidney disease, stage 3b: Secondary | ICD-10-CM | POA: Diagnosis present

## 2020-09-05 DIAGNOSIS — Z833 Family history of diabetes mellitus: Secondary | ICD-10-CM

## 2020-09-05 DIAGNOSIS — I440 Atrioventricular block, first degree: Secondary | ICD-10-CM | POA: Diagnosis not present

## 2020-09-05 DIAGNOSIS — K644 Residual hemorrhoidal skin tags: Secondary | ICD-10-CM | POA: Diagnosis present

## 2020-09-05 DIAGNOSIS — Z9114 Patient's other noncompliance with medication regimen: Secondary | ICD-10-CM

## 2020-09-05 DIAGNOSIS — K5909 Other constipation: Secondary | ICD-10-CM | POA: Diagnosis present

## 2020-09-05 DIAGNOSIS — I25110 Atherosclerotic heart disease of native coronary artery with unstable angina pectoris: Secondary | ICD-10-CM | POA: Diagnosis present

## 2020-09-05 DIAGNOSIS — R0609 Other forms of dyspnea: Secondary | ICD-10-CM

## 2020-09-05 DIAGNOSIS — Z7982 Long term (current) use of aspirin: Secondary | ICD-10-CM

## 2020-09-05 DIAGNOSIS — Z794 Long term (current) use of insulin: Secondary | ICD-10-CM

## 2020-09-05 DIAGNOSIS — R1031 Right lower quadrant pain: Secondary | ICD-10-CM | POA: Diagnosis not present

## 2020-09-05 DIAGNOSIS — J690 Pneumonitis due to inhalation of food and vomit: Secondary | ICD-10-CM | POA: Diagnosis not present

## 2020-09-05 HISTORY — PX: LEFT HEART CATH AND CORONARY ANGIOGRAPHY: CATH118249

## 2020-09-05 HISTORY — PX: CORONARY BALLOON ANGIOPLASTY: CATH118233

## 2020-09-05 LAB — CBC WITH DIFFERENTIAL/PLATELET
Abs Immature Granulocytes: 0.04 10*3/uL (ref 0.00–0.07)
Basophils Absolute: 0 10*3/uL (ref 0.0–0.1)
Basophils Relative: 0 %
Eosinophils Absolute: 0.1 10*3/uL (ref 0.0–0.5)
Eosinophils Relative: 1 %
HCT: 24.9 % — ABNORMAL LOW (ref 36.0–46.0)
Hemoglobin: 8.2 g/dL — ABNORMAL LOW (ref 12.0–15.0)
Immature Granulocytes: 0 %
Lymphocytes Relative: 34 %
Lymphs Abs: 3.2 10*3/uL (ref 0.7–4.0)
MCH: 33.5 pg (ref 26.0–34.0)
MCHC: 32.9 g/dL (ref 30.0–36.0)
MCV: 101.6 fL — ABNORMAL HIGH (ref 80.0–100.0)
Monocytes Absolute: 0.3 10*3/uL (ref 0.1–1.0)
Monocytes Relative: 3 %
Neutro Abs: 6 10*3/uL (ref 1.7–7.7)
Neutrophils Relative %: 62 %
Platelets: 279 10*3/uL (ref 150–400)
RBC: 2.45 MIL/uL — ABNORMAL LOW (ref 3.87–5.11)
RDW: 15.1 % (ref 11.5–15.5)
WBC: 9.7 10*3/uL (ref 4.0–10.5)
nRBC: 0 % (ref 0.0–0.2)

## 2020-09-05 LAB — GLUCOSE, CAPILLARY
Glucose-Capillary: 226 mg/dL — ABNORMAL HIGH (ref 70–99)
Glucose-Capillary: 253 mg/dL — ABNORMAL HIGH (ref 70–99)
Glucose-Capillary: 33 mg/dL — CL (ref 70–99)
Glucose-Capillary: 166 mg/dL — ABNORMAL HIGH (ref 70–99)
Glucose-Capillary: 64 mg/dL — ABNORMAL LOW (ref 70–99)
Glucose-Capillary: 124 mg/dL — ABNORMAL HIGH (ref 70–99)
Glucose-Capillary: 71 mg/dL (ref 70–99)
Glucose-Capillary: 93 mg/dL (ref 70–99)
Glucose-Capillary: 127 mg/dL — ABNORMAL HIGH (ref 70–99)
Glucose-Capillary: 150 mg/dL — ABNORMAL HIGH (ref 70–99)
Glucose-Capillary: 386 mg/dL — ABNORMAL HIGH (ref 70–99)
Glucose-Capillary: 368 mg/dL — ABNORMAL HIGH (ref 70–99)

## 2020-09-05 LAB — BASIC METABOLIC PANEL
Anion gap: 7 (ref 5–15)
BUN: 58 mg/dL — ABNORMAL HIGH (ref 8–23)
CO2: 26 mmol/L (ref 22–32)
Calcium: 8.7 mg/dL — ABNORMAL LOW (ref 8.9–10.3)
Chloride: 107 mmol/L (ref 98–111)
Creatinine, Ser: 2.01 mg/dL — ABNORMAL HIGH (ref 0.44–1.00)
GFR, Estimated: 27 mL/min — ABNORMAL LOW (ref >60)
Glucose, Bld: 134 mg/dL — ABNORMAL HIGH (ref 70–99)
Potassium: 4.2 mmol/L (ref 3.5–5.1)
Sodium: 140 mmol/L (ref 135–145)

## 2020-09-05 LAB — HEPARIN LEVEL (UNFRACTIONATED): Heparin Unfractionated: 0.33 IU/mL (ref 0.30–0.70)

## 2020-09-05 LAB — POCT ACTIVATED CLOTTING TIME: Activated Clotting Time: 312 seconds

## 2020-09-05 LAB — RETICULOCYTES
Immature Retic Fract: 27.8 % — ABNORMAL HIGH (ref 2.3–15.9)
RBC.: 2.61 MIL/uL — ABNORMAL LOW (ref 3.87–5.11)
Retic Count, Absolute: 105.2 10*3/uL (ref 19.0–186.0)
Retic Ct Pct: 4 % — ABNORMAL HIGH (ref 0.4–3.1)

## 2020-09-05 SURGERY — LEFT HEART CATH AND CORONARY ANGIOGRAPHY
Anesthesia: Moderate Sedation

## 2020-09-05 MED ORDER — SODIUM CHLORIDE 0.9 % IV SOLN: INTRAVENOUS | Status: DC

## 2020-09-05 MED ORDER — FENTANYL CITRATE (PF) 100 MCG/2ML IJ SOLN
INTRAMUSCULAR | Status: DC | PRN
  Administered 2020-09-05: 25 ug via INTRAVENOUS

## 2020-09-05 MED ORDER — DEXTROSE 50 % IV SOLN: 12.5000 g | INTRAVENOUS | Status: AC

## 2020-09-05 MED ORDER — ONDANSETRON HCL 4 MG/2ML IJ SOLN: 4.0000 mg | Freq: Four times a day (QID) | INTRAMUSCULAR | Status: DC | PRN

## 2020-09-05 MED ORDER — ATORVASTATIN CALCIUM 40 MG PO TABS: 40.0000 mg | ORAL_TABLET | Freq: Every day | ORAL | Status: DC

## 2020-09-05 MED ORDER — INSULIN ASPART 100 UNIT/ML IJ SOLN
0.0000 [IU] | Freq: Three times a day (TID) | INTRAMUSCULAR | Status: DC
  Administered 2020-09-06 (×2): 9 [IU] via SUBCUTANEOUS
  Administered 2020-09-07: 1 [IU] via SUBCUTANEOUS
  Administered 2020-09-07 (×2): 3 [IU] via SUBCUTANEOUS
  Administered 2020-09-08: 2 [IU] via SUBCUTANEOUS
  Administered 2020-09-08: 7 [IU] via SUBCUTANEOUS
  Administered 2020-09-08: 3 [IU] via SUBCUTANEOUS
  Administered 2020-09-09: 7 [IU] via SUBCUTANEOUS
  Administered 2020-09-09: 2 [IU] via SUBCUTANEOUS
  Administered 2020-09-10: 1 [IU] via SUBCUTANEOUS
  Administered 2020-09-10: 9 [IU] via SUBCUTANEOUS
  Administered 2020-09-10: 7 [IU] via SUBCUTANEOUS
  Administered 2020-09-11 (×2): 5 [IU] via SUBCUTANEOUS
  Administered 2020-09-12: 1 [IU] via SUBCUTANEOUS
  Administered 2020-09-12: 2 [IU] via SUBCUTANEOUS
  Administered 2020-09-12: 9 [IU] via SUBCUTANEOUS

## 2020-09-05 MED ORDER — INSULIN GLARGINE-YFGN 100 UNIT/ML ~~LOC~~ SOLN
18.0000 [IU] | Freq: Every day | SUBCUTANEOUS | Status: DC
  Filled 2020-09-05: qty 0.18

## 2020-09-05 MED ORDER — HEPARIN (PORCINE) 25000 UT/250ML-% IV SOLN
700.0000 [IU]/h | INTRAVENOUS | Status: DC
Start: 2020-09-05 — End: 2020-09-18

## 2020-09-05 MED ORDER — HEPARIN (PORCINE) IN NACL 1000-0.9 UT/500ML-% IV SOLN
INTRAVENOUS | Status: AC
  Filled 2020-09-05: qty 1000

## 2020-09-05 MED ORDER — DEXTROSE 50 % IV SOLN: 1.0000 | Freq: Once | INTRAVENOUS | Status: DC

## 2020-09-05 MED ORDER — AMLODIPINE BESYLATE 10 MG PO TABS
10.0000 mg | ORAL_TABLET | Freq: Every day | ORAL | Status: DC
  Administered 2020-09-06 – 2020-09-09 (×4): 10 mg via ORAL
  Filled 2020-09-05 (×4): qty 1

## 2020-09-05 MED ORDER — INSULIN ASPART 100 UNIT/ML IJ SOLN
0.0000 [IU] | Freq: Every day | INTRAMUSCULAR | Status: DC
Start: 2020-09-06 — End: 2020-09-18
  Administered 2020-09-05: 5 [IU] via SUBCUTANEOUS
  Administered 2020-09-10 – 2020-09-12 (×2): 4 [IU] via SUBCUTANEOUS
  Administered 2020-09-13: 3 [IU] via SUBCUTANEOUS
  Administered 2020-09-14: 5 [IU] via SUBCUTANEOUS

## 2020-09-05 MED ORDER — ASPIRIN 81 MG PO CHEW: 81.0000 mg | CHEWABLE_TABLET | Freq: Every day | ORAL | Status: DC

## 2020-09-05 MED ORDER — ALBUTEROL SULFATE (2.5 MG/3ML) 0.083% IN NEBU: 3.0000 mL | INHALATION_SOLUTION | Freq: Four times a day (QID) | RESPIRATORY_TRACT | Status: DC | PRN

## 2020-09-05 MED ORDER — MORPHINE SULFATE (PF) 2 MG/ML IV SOLN
INTRAVENOUS | Status: AC
  Filled 2020-09-05: qty 1

## 2020-09-05 MED ORDER — BREXPIPRAZOLE 1 MG PO TABS: 1.0000 mg | ORAL_TABLET | Freq: Every day | ORAL | Status: AC

## 2020-09-05 MED ORDER — HEPARIN (PORCINE) IN NACL 2000-0.9 UNIT/L-% IV SOLN
INTRAVENOUS | Status: DC | PRN
  Administered 2020-09-05: 1000 mL

## 2020-09-05 MED ORDER — INSULIN ASPART 100 UNIT/ML IJ SOLN
3.0000 [IU] | Freq: Three times a day (TID) | INTRAMUSCULAR | Status: DC
  Administered 2020-09-06 – 2020-09-08 (×8): 3 [IU] via SUBCUTANEOUS

## 2020-09-05 MED ORDER — NITROGLYCERIN 0.4 MG SL SUBL
0.4000 mg | SUBLINGUAL_TABLET | SUBLINGUAL | Status: DC | PRN
  Administered 2020-09-11 (×2): 0.4 mg via SUBLINGUAL

## 2020-09-05 MED ORDER — SODIUM CHLORIDE 0.9 % IV SOLN: 250.0000 mL | INTRAVENOUS | Status: DC | PRN

## 2020-09-05 MED ORDER — HYDRALAZINE HCL 20 MG/ML IJ SOLN: 10.0000 mg | INTRAMUSCULAR | Status: DC | PRN

## 2020-09-05 MED ORDER — DEXTROSE 50 % IV SOLN
INTRAVENOUS | Status: AC
  Administered 2020-09-05: 50 mL
  Filled 2020-09-05: qty 50

## 2020-09-05 MED ORDER — DEXTROSE-NACL 5-0.45 % IV SOLN: INTRAVENOUS | Status: DC

## 2020-09-05 MED ORDER — TICAGRELOR 90 MG PO TABS
90.0000 mg | ORAL_TABLET | Freq: Two times a day (BID) | ORAL | Status: DC
  Administered 2020-09-06 – 2020-09-15 (×19): 90 mg via ORAL
  Filled 2020-09-05 (×19): qty 1

## 2020-09-05 MED ORDER — PANTOPRAZOLE SODIUM 40 MG PO TBEC
40.0000 mg | DELAYED_RELEASE_TABLET | Freq: Two times a day (BID) | ORAL | Status: DC
  Administered 2020-09-05 – 2020-09-18 (×25): 40 mg via ORAL
  Filled 2020-09-05 (×26): qty 1

## 2020-09-05 MED ORDER — NEPRO/CARBSTEADY PO LIQD
237.0000 mL | Freq: Two times a day (BID) | ORAL | Status: DC
  Administered 2020-09-06 – 2020-09-10 (×7): 237 mL via ORAL

## 2020-09-05 MED ORDER — MIRTAZAPINE 7.5 MG PO TABS
15.0000 mg | ORAL_TABLET | Freq: Every day | ORAL | Status: DC
  Administered 2020-09-05 – 2020-09-17 (×12): 15 mg via ORAL
  Filled 2020-09-05: qty 1
  Filled 2020-09-05 (×3): qty 2
  Filled 2020-09-05: qty 1
  Filled 2020-09-05 (×3): qty 2
  Filled 2020-09-05 (×4): qty 1
  Filled 2020-09-05: qty 2

## 2020-09-05 MED ORDER — INSULIN ASPART 100 UNIT/ML IJ SOLN: 0.0000 [IU] | Freq: Three times a day (TID) | INTRAMUSCULAR | Status: DC

## 2020-09-05 MED ORDER — ACETAMINOPHEN 325 MG PO TABS
650.0000 mg | ORAL_TABLET | ORAL | Status: DC | PRN
  Administered 2020-09-16: 650 mg via ORAL
  Filled 2020-09-05 (×2): qty 2

## 2020-09-05 MED ORDER — BIVALIRUDIN BOLUS VIA INFUSION - CUPID
INTRAVENOUS | Status: DC | PRN
  Administered 2020-09-05: 32.25 mg via INTRAVENOUS

## 2020-09-05 MED ORDER — FENTANYL CITRATE (PF) 100 MCG/2ML IJ SOLN
INTRAMUSCULAR | Status: AC
  Filled 2020-09-05: qty 2

## 2020-09-05 MED ORDER — LIDOCAINE HCL (PF) 1 % IJ SOLN
INTRAMUSCULAR | Status: DC | PRN
  Administered 2020-09-05: 30 mL

## 2020-09-05 MED ORDER — NICOTINE 21 MG/24HR TD PT24
21.0000 mg | MEDICATED_PATCH | Freq: Every day | TRANSDERMAL | Status: DC
  Administered 2020-09-05 – 2020-09-18 (×13): 21 mg via TRANSDERMAL
  Filled 2020-09-05 (×14): qty 1

## 2020-09-05 MED ORDER — FAMOTIDINE 20 MG PO TABS
20.0000 mg | ORAL_TABLET | Freq: Every day | ORAL | Status: DC
  Administered 2020-09-05 – 2020-09-10 (×6): 20 mg via ORAL
  Filled 2020-09-05 (×6): qty 1

## 2020-09-05 MED ORDER — SODIUM CHLORIDE 0.9% FLUSH: 3.0000 mL | Freq: Two times a day (BID) | INTRAVENOUS | Status: DC

## 2020-09-05 MED ORDER — SODIUM CHLORIDE 0.9% FLUSH: 3.0000 mL | INTRAVENOUS | Status: DC | PRN

## 2020-09-05 MED ORDER — ONDANSETRON HCL 4 MG/2ML IJ SOLN
4.0000 mg | Freq: Four times a day (QID) | INTRAMUSCULAR | Status: DC | PRN
  Administered 2020-09-06 – 2020-09-11 (×3): 4 mg via INTRAVENOUS
  Filled 2020-09-05 (×3): qty 2

## 2020-09-05 MED ORDER — METOCLOPRAMIDE HCL 10 MG PO TABS
10.0000 mg | ORAL_TABLET | Freq: Four times a day (QID) | ORAL | Status: DC | PRN
  Filled 2020-09-05: qty 1

## 2020-09-05 MED ORDER — LABETALOL HCL 5 MG/ML IV SOLN: 10.0000 mg | INTRAVENOUS | Status: DC | PRN

## 2020-09-05 MED ORDER — MIDAZOLAM HCL 2 MG/2ML IJ SOLN
INTRAMUSCULAR | Status: AC
  Filled 2020-09-05: qty 2

## 2020-09-05 MED ORDER — IOHEXOL 300 MG/ML  SOLN
INTRAMUSCULAR | Status: DC | PRN
  Administered 2020-09-05: 57 mL

## 2020-09-05 MED ORDER — DEXTROSE 50 % IV SOLN
INTRAVENOUS | Status: DC | PRN
  Administered 2020-09-05: 25 mL via INTRAVENOUS

## 2020-09-05 MED ORDER — SODIUM CHLORIDE 0.9 % WEIGHT BASED INFUSION: 1.0000 mL/kg/h | INTRAVENOUS | Status: DC

## 2020-09-05 MED ORDER — ISOSORBIDE MONONITRATE ER 30 MG PO TB24
30.0000 mg | ORAL_TABLET | Freq: Every day | ORAL | Status: DC
  Administered 2020-09-05 – 2020-09-09 (×5): 30 mg via ORAL
  Filled 2020-09-05 (×5): qty 1

## 2020-09-05 MED ORDER — NEPRO/CARBSTEADY PO LIQD: 237.0000 mL | Freq: Two times a day (BID) | ORAL | 0 refills | Status: AC

## 2020-09-05 MED ORDER — LIDOCAINE HCL 1 % IJ SOLN
INTRAMUSCULAR | Status: AC
  Filled 2020-09-05: qty 20

## 2020-09-05 MED ORDER — TICAGRELOR 90 MG PO TABS
ORAL_TABLET | ORAL | Status: AC
  Filled 2020-09-05: qty 2

## 2020-09-05 MED ORDER — INSULIN ASPART 100 UNIT/ML IJ SOLN: 0.0000 [IU] | Freq: Every day | INTRAMUSCULAR | Status: DC

## 2020-09-05 MED ORDER — OXYCODONE HCL 5 MG PO TABS
5.0000 mg | ORAL_TABLET | Freq: Four times a day (QID) | ORAL | Status: DC | PRN
  Administered 2020-09-05: 5 mg via ORAL
  Filled 2020-09-05 (×2): qty 1

## 2020-09-05 MED ORDER — ROSUVASTATIN CALCIUM 5 MG PO TABS: 10.0000 mg | ORAL_TABLET | Freq: Every day | ORAL | Status: DC

## 2020-09-05 MED ORDER — MIDAZOLAM HCL 2 MG/2ML IJ SOLN
INTRAMUSCULAR | Status: DC | PRN
  Administered 2020-09-05: 0.5 mg via INTRAVENOUS

## 2020-09-05 MED ORDER — BIVALIRUDIN TRIFLUOROACETATE 250 MG IV SOLR
INTRAVENOUS | Status: AC
  Filled 2020-09-05: qty 250

## 2020-09-05 MED ORDER — CARVEDILOL 25 MG PO TABS
25.0000 mg | ORAL_TABLET | Freq: Two times a day (BID) | ORAL | Status: DC
  Administered 2020-09-05 – 2020-09-09 (×9): 25 mg via ORAL
  Filled 2020-09-05 (×9): qty 1

## 2020-09-05 MED ORDER — BREXPIPRAZOLE 1 MG PO TABS
1.0000 mg | ORAL_TABLET | Freq: Every day | ORAL | Status: DC
  Administered 2020-09-06 – 2020-09-18 (×13): 1 mg via ORAL
  Filled 2020-09-05 (×13): qty 1

## 2020-09-05 MED ORDER — ASPIRIN 81 MG PO CHEW
CHEWABLE_TABLET | ORAL | Status: AC
  Filled 2020-09-05: qty 3

## 2020-09-05 MED ORDER — TICAGRELOR 90 MG PO TABS
ORAL_TABLET | ORAL | Status: DC | PRN
  Administered 2020-09-05: 180 mg via ORAL

## 2020-09-05 MED ORDER — IOHEXOL 300 MG/ML  SOLN
INTRAMUSCULAR | Status: DC | PRN
Start: 2020-09-05 — End: 2020-09-05
  Administered 2020-09-05: 66 mL via INTRA_ARTERIAL

## 2020-09-05 MED ORDER — ASPIRIN 300 MG RE SUPP
300.0000 mg | RECTAL | Status: AC
  Filled 2020-09-05: qty 1

## 2020-09-05 MED ORDER — INSULIN GLARGINE-YFGN 100 UNIT/ML ~~LOC~~ SOLN: 18.0000 [IU] | Freq: Every day | SUBCUTANEOUS | 11 refills | Status: AC

## 2020-09-05 MED ORDER — SODIUM CHLORIDE 0.9 % IV SOLN
INTRAVENOUS | Status: DC | PRN
  Administered 2020-09-05: 1 mg/kg/h via INTRAVENOUS

## 2020-09-05 MED ORDER — ASPIRIN 81 MG PO CHEW
324.0000 mg | CHEWABLE_TABLET | ORAL | Status: AC
  Administered 2020-09-05: 324 mg via ORAL
  Filled 2020-09-05: qty 4

## 2020-09-05 MED ORDER — ASPIRIN 81 MG PO TBEC: 81.0000 mg | DELAYED_RELEASE_TABLET | Freq: Every day | ORAL | Status: DC

## 2020-09-05 MED ORDER — HYDRALAZINE HCL 25 MG PO TABS
25.0000 mg | ORAL_TABLET | Freq: Three times a day (TID) | ORAL | Status: DC
  Administered 2020-09-05 – 2020-09-06 (×2): 25 mg via ORAL
  Filled 2020-09-05 (×2): qty 1

## 2020-09-05 MED ORDER — INSULIN GLARGINE-YFGN 100 UNIT/ML ~~LOC~~ SOLN: 18.0000 [IU] | Freq: Every day | SUBCUTANEOUS | Status: DC

## 2020-09-05 MED ORDER — ACETAMINOPHEN 325 MG PO TABS
650.0000 mg | ORAL_TABLET | ORAL | Status: DC | PRN
Start: 2020-09-05 — End: 2020-09-05

## 2020-09-05 MED ORDER — ASPIRIN 81 MG PO CHEW
CHEWABLE_TABLET | ORAL | Status: DC | PRN
  Administered 2020-09-05: 243 mg via ORAL

## 2020-09-05 MED ORDER — DEXTROSE 50 % IV SOLN
INTRAVENOUS | Status: AC
  Administered 2020-09-05: 12.5 g via INTRAVENOUS
  Filled 2020-09-05: qty 50

## 2020-09-05 MED ORDER — HEPARIN (PORCINE) 25000 UT/250ML-% IV SOLN
800.0000 [IU]/h | INTRAVENOUS | Status: DC
  Administered 2020-09-05: 700 [IU]/h via INTRAVENOUS
  Administered 2020-09-07 – 2020-09-08 (×2): 800 [IU]/h via INTRAVENOUS
  Filled 2020-09-05 (×3): qty 250

## 2020-09-05 MED ORDER — INSULIN LISPRO (1 UNIT DIAL) 100 UNIT/ML (KWIKPEN): 3.0000 [IU] | PEN_INJECTOR | Freq: Three times a day (TID) | SUBCUTANEOUS | Status: DC

## 2020-09-05 MED ORDER — ASPIRIN EC 81 MG PO TBEC
81.0000 mg | DELAYED_RELEASE_TABLET | Freq: Every day | ORAL | Status: DC
  Administered 2020-09-06 – 2020-09-18 (×13): 81 mg via ORAL
  Filled 2020-09-05 (×13): qty 1

## 2020-09-05 SURGICAL SUPPLY — 21 items
BALLN TREK RX 2.25X15 (BALLOONS) ×2
BALLOON TREK RX 2.25X15 (BALLOONS) ×1 IMPLANT
CATH INFINITI 5FR ANG PIGTAIL (CATHETERS) ×2 IMPLANT
CATH INFINITI 5FR JL4 (CATHETERS) ×2 IMPLANT
CATH INFINITI JR4 5F (CATHETERS) ×2 IMPLANT
CATH VISTA GUIDE 6FR JR4 SH (CATHETERS) ×2 IMPLANT
DEVICE CLOSURE MYNXGRIP 5F (Vascular Products) ×2 IMPLANT
DEVICE CLOSURE MYNXGRIP 6/7F (Vascular Products) ×2 IMPLANT
DRAPE BRACHIAL (DRAPES) IMPLANT
KIT ENCORE 26 ADVANTAGE (KITS) ×2 IMPLANT
NEEDLE PERC 18GX7CM (NEEDLE) ×2 IMPLANT
PACK CARDIAC CATH (CUSTOM PROCEDURE TRAY) ×2 IMPLANT
PROTECTION STATION PRESSURIZED (MISCELLANEOUS) ×2
SET ATX SIMPLICITY (MISCELLANEOUS) ×2 IMPLANT
SHEATH AVANTI 5FR X 11CM (SHEATH) ×4 IMPLANT
SHEATH AVANTI 6FR X 11CM (SHEATH) ×2 IMPLANT
STATION PROTECTION PRESSURIZED (MISCELLANEOUS) ×1 IMPLANT
SUT SILK 0 FSL (SUTURE) ×2 IMPLANT
TUBING CIL FLEX 10 FLL-RA (TUBING) ×2 IMPLANT
WIRE G HI TQ BMW 190 (WIRE) ×2 IMPLANT
WIRE GUIDERIGHT .035X150 (WIRE) ×2 IMPLANT

## 2020-09-05 NOTE — Progress Notes (Signed)
Ambulance arrived to take pt. To [facility] in [place]. Pt. C/o CP "5" on scale 1-10, radiating to back . VSS.

## 2020-09-05 NOTE — Progress Notes (Signed)
Inpatient Diabetes Program Recommendations  AACE/ADA: New Consensus Statement on Inpatient Glycemic Control   Target Ranges:  Prepandial:   less than 140 mg/dL      Peak postprandial:   less than 180 mg/dL (1-2 hours)      Critically ill patients:  140 - 180 mg/dL  Results for Erica Mercer, Erica Mercer (MRN [id number]) as of 09/05/2020 08:30  Ref. Range 09/05/2020 08:10 09/05/2020 08:19 09/05/2020 08:23 09/05/2020 08:24 09/05/2020 08:35  Glucose-Capillary Latest Ref Range: 70 - 99 mg/dL 36 (LL) 33 (LL) 253 (H) 226 (H) 166 (H)   Results for Erica Mercer, Erica Mercer (MRN [id number]) as of 09/05/2020 08:30  Ref. Range 09/04/2020 07:37 09/04/2020 11:10 09/04/2020 17:43 09/04/2020 20:19  Glucose-Capillary Latest Ref Range: 70 - 99 mg/dL 294 (H) 194 (H) 176 (H) 210 (H)    Review of Glycemic Control  Diabetes history: DM Outpatient Diabetes medications: Lantus 20 units daily, Humalog 3 units TID with meals Current orders for Inpatient glycemic control: Semglee 23 units QHS, Novolog 0-9 units TID with meals, Novolog 0-5 units QHS, Novolog 3 units TID with meals for meal coverage  Inpatient Diabetes Program Recommendations:    Insulin: Please consider decreasing Semglee to 18 units QHS. Please decrease Novolog correction to 0-6 units TID with meals.  NOTE: Noted glucose 36 and 33 mg/dl this morning. Communicated with Vickie, RN and she reports that hypoglycemia was treated with PO intake and D50.    Thanks, Barnie Alderman, RN, MSN, CDE Diabetes Coordinator Inpatient Diabetes Program [phone] (Team Pager from 8am to 5pm)

## 2020-09-05 NOTE — Progress Notes (Addendum)
Brief post interventional procedure note Asked to intervene on the right coronary artery by Dr. Humphrey Rolls Patient has a 95% right coronary artery with moderate calcification proximally We attempted to intervene on the proximal lesion but were unable to cross with the balloon because of heavy calcification We aborted the case and consulted [facility] Patient is now being transferred to [facility] to telemetry bed not emergently for possible intervention on Monday Case discussed with Dr. Wynetta Fines hospitalist at [facility] who is excepted the patient in transfer to the medicine service

## 2020-09-05 NOTE — Progress Notes (Signed)
SUBJECTIVE: Patient denies any further chest pain   Vitals:   09/04/20 1619 09/04/20 1940 09/05/20 0338 09/05/20 0815  BP: 125/76 115/69 126/74 (!) 187/83  Pulse: 69 79 63 (!) 57  Resp: '18 20 18 18  '$ Temp: 98.1 F (36.7 C) 99.1 F (37.3 C) 98.8 F (37.1 C) 98 F (36.7 C)  TempSrc:      SpO2: 95% 91% 98% 90%  Weight:      Height:        Intake/Output Summary (Last 24 hours) at 09/05/2020 0845 Last data filed at 09/04/2020 1800 Gross per 24 hour  Intake 1600.28 ml  Output 400 ml  Net 1200.28 ml    LABS: Basic Metabolic Panel: Recent Labs    09/04/20 0924 09/05/20 0453  NA 135 140  K 4.4 4.2  CL 102 107  CO2 27 26  GLUCOSE 294* 134*  BUN 46* 58*  CREATININE 1.28* 2.01*  CALCIUM 9.4 8.7*   Liver Function Tests: Recent Labs    09/03/20 1035  AST 86*  ALT 55*  ALKPHOS 143*  BILITOT 0.8  PROT 6.6  ALBUMIN 3.2*   No results for input(s): LIPASE, AMYLASE in the last 72 hours. CBC: Recent Labs    09/04/20 0924 09/05/20 0453  WBC 17.4* 9.7  NEUTROABS  --  6.0  HGB 9.1* 8.2*  HCT 27.0* 24.9*  MCV 98.9 101.6*  PLT 316 279   Cardiac Enzymes: No results for input(s): CKTOTAL, CKMB, CKMBINDEX, TROPONINI in the last 72 hours. BNP: Invalid input(s): POCBNP D-Dimer: No results for input(s): DDIMER in the last 72 hours. Hemoglobin A1C: No results for input(s): HGBA1C in the last 72 hours. Fasting Lipid Panel: No results for input(s): CHOL, HDL, LDLCALC, TRIG, CHOLHDL, LDLDIRECT in the last 72 hours. Thyroid Function Tests: Recent Labs    09/03/20 1320  TSH 1.029   Anemia Panel: No results for input(s): VITAMINB12, FOLATE, FERRITIN, TIBC, IRON, RETICCTPCT in the last 72 hours.   PHYSICAL EXAM General: Well developed, well nourished, in no acute distress HEENT:  Normocephalic and atramatic Neck:  No JVD.  Lungs: Clear bilaterally to auscultation and percussion. Heart: HRRR . Normal S1 and S2 without gallops or murmurs.  Abdomen: Bowel sounds are  positive, abdomen soft and non-tender  Msk:  Back normal, normal gait. Normal strength and tone for age. Extremities: No clubbing, cyanosis or edema.   Neuro: Alert and oriented X 3. Psych:  Good affect, responds appropriately  TELEMETRY: Sinus rhythm  ASSESSMENT AND PLAN: Chest pain with elevated troponin possible non-STEMI with history of hypertension diabetes.  Patient will undergo cardiac catheterization which was explained to the patient and patient agreed to it.  Principal Problem:   Bipolar depression ([place]) Active Problems:   DKA (diabetic ketoacidosis) (HCC)   Severe protein-calorie malnutrition ([place])   Essential hypertension   HLD (hyperlipidemia)   DKA, type 1 (HCC)   COPD (chronic obstructive pulmonary disease) (HCC)   Bipolar 1 disorder (HCC)   NSTEMI (non-ST elevated myocardial infarction) (HCC)   CAD (coronary artery disease)   Tobacco abuse   Depression   Elevated troponin   Protein-calorie malnutrition, moderate (HCC)   CKD (chronic kidney disease), stage IIIa    Erica Kovacic A, MD, [facility] 09/05/2020 8:45 AM

## 2020-09-05 NOTE — Care Management Important Message (Signed)
Important Message  Patient Details  Name: Erica Mercer MRN: [id number] Date of Birth: 05/01/1955   Medicare Important Message Given:  Yes  Patient out of room for procedure.  Copy of Medicare IM left in room for reference.     Dannette Barbara 09/05/2020, 1:25 PM

## 2020-09-05 NOTE — Progress Notes (Signed)
Patient had a moderate to severe lesion severely calcified and mid LAD along with high-grade lesion about 95% and mid RCA.  Culprit lesion is mid RCA which is critical this will have angioplasty and stenting with drug-eluting stent.  Patient is doing very well and can be discharged in the morning be able to with follow-up in the office Tuesday next week at 1 PM.

## 2020-09-05 NOTE — TOC Initial Note (Addendum)
Transition of Care [facility]) - Initial/Assessment Note    Patient Details  Name: Erica Mercer MRN: [id number] Date of Birth: 02/14/1955  Transition of Care [facility]) CM/SW Contact:    Beverly Sessions, RN Phone Number: 09/05/2020, 10:30 AM  Clinical Narrative:                  Patient noted to have extreme risk for readmission score.  Patient currently off the floor, and unable to complete assessment     245 Pm - still off the floor.  Pending transfer to [facility]   Patient Goals and CMS Choice        Expected Discharge Plan and Services                                                Prior Living Arrangements/Services                       Activities of Daily Living Home Assistive Devices/Equipment: Cane (specify quad or straight), Walker (specify type) ADL Screening (condition at time of admission) Patient's cognitive ability adequate to safely complete daily activities?: Yes Is the patient deaf or have difficulty hearing?: No Does the patient have difficulty seeing, even when wearing glasses/contacts?: No Does the patient have difficulty concentrating, remembering, or making decisions?: No Patient able to express need for assistance with ADLs?: Yes Does the patient have difficulty dressing or bathing?: No Independently performs ADLs?: Yes (appropriate for developmental age) Does the patient have difficulty walking or climbing stairs?: Yes Weakness of Legs: None Weakness of Arms/Hands: None  Permission Sought/Granted                  Emotional Assessment              Admission diagnosis:  Hyperglycemia [R73.9] Elevated troponin [R77.8] NSTEMI (non-ST elevated myocardial infarction) ([place]) [I21.4] AKI (acute kidney injury) ([place]) [N17.9] Acute congestive heart failure, unspecified heart failure type ([place]) [I50.9] Patient Active Problem List   Diagnosis Date Noted   Bipolar depression ([place]) 09/04/2020   Type II diabetes mellitus with renal  manifestations ([place]) 08/20/2020   Nausea vomiting and diarrhea 08/20/2020   Protein-calorie malnutrition, moderate ([place]) 08/20/2020   CKD (chronic kidney disease), stage IIIa 08/20/2020   Vaginal itching 08/20/2020   Type 2 diabetes mellitus with hypoglycemia without coma, with long-term current use of insulin (HCC)    Elevated troponin    Weakness    Lactic acidosis    Hyperkalemia 07/30/2020   Hyponatremia [id number]   Acute metabolic encephalopathy A999333   Hypertensive urgency 07/30/2020   Hematemesis of unknown etiology 07/30/2020   Meningitis 07/30/2020   Stage 3a chronic kidney disease ([place])    CAD (coronary artery disease) 05/05/2020   Hyperosmolar hyperglycemic state (HHS) ([place]) 05/05/2020   Abdominal pain 05/05/2020   GERD (gastroesophageal reflux disease) 05/05/2020   Tobacco abuse 05/05/2020   Left arm numbness 05/05/2020   Depression 05/05/2020   Hyperglycemia    AKI (acute kidney injury) ([place]) 04/30/2020   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[place]) 04/30/2020   Sepsis ([place]) 04/30/2020   DKA, type 1 ([place]) 02/26/2020   Acute renal failure superimposed on stage 3a chronic kidney disease ([place])    DKA (diabetic ketoacidosis) ([place]) 02/25/2020   Severe protein-calorie malnutrition ([place]) 02/25/2020   Essential hypertension 02/25/2020   HLD (  hyperlipidemia) 02/25/2020   Soft tissue mass 02/25/2020   H/O cocaine abuse ([place]) 02/17/2018   Bipolar 1 disorder ([place]) 04/22/2016   NSTEMI (non-ST elevated myocardial infarction) ([place]) 04/22/2016   COPD (chronic obstructive pulmonary disease) ([place]) 01/22/2016   PCP:  [name], NP Pharmacy:   [facility] [place], [address] [place] [address] Phone: [phone] Fax: [phone]     Social Determinants of Health (SDOH) Interventions    Readmission Risk Interventions Readmission Risk Prevention Plan 08/21/2020  Transportation  Screening Complete  Medication Review (RN Care Manager) Complete  PCP or Specialist appointment within 3-5 days of discharge Complete  HRI or [place] Complete  SW Recovery Care/Counseling Consult Complete  [place] Not Applicable

## 2020-09-05 NOTE — Progress Notes (Signed)
Pt. BG: 63: given 1/2 amp D50W. BG now 124. Spoke with MD. D51/2NS hung now at 50 ml/hr. For cath procedure.

## 2020-09-05 NOTE — Consult Note (Signed)
[place] for IV Heparin Indication: chest pain/ACS  Patient Measurements: Heparin Dosing Weight: 43 kg  Labs: Recent Labs    09/03/20 1035 09/03/20 1221 09/03/20 1320 09/03/20 1838 09/04/20 0516 09/04/20 0816 09/04/20 0924 09/04/20 1615 09/05/20 0453  HGB 9.7*  --   --   --  9.2*  --  9.1*  --  8.2*  HCT 29.4*  --   --   --  27.2*  --  27.0*  --  24.9*  PLT 318  --   --   --  336  --  316  --  279  APTT  --   --  25  --   --   --   --   --   --   LABPROT  --   --  13.5  --   --   --  12.9  --   --   INR  --   --  1.0  --   --   --  1.0  --   --   HEPARINUNFRC  --   --   --    < >  --  0.50  --  0.49 0.33  CREATININE 1.48*  --   --    < > 1.36*  --  1.28*  --  2.01*  TROPONINIHS 154* 237*  --   --   --   --   --   --   --    < > = values in this interval not displayed.     Estimated Creatinine Clearance: 18.9 mL/min (A) (by C-G formula based on SCr of 2.01 mg/dL (H)).   Medical History: Past Medical History:  Diagnosis Date   Bipolar 1 disorder ([place])    CAD (coronary artery disease)    Diabetes mellitus without complication ([place])    HLD (hyperlipidemia)    HTN (hypertension)     Medications:  No anticoagulation prior to admission per my chart review. Medication reconciliation is pending  Assessment: Patient is a 65 y/o F with medical history as above and including CAD / NSTEMI, cocaine abuse who presented to the ED with SOB / hyperglycemia / hypertensive emergency. Troponin elevated to 154. Pharmacy consulted for IV heparin for ACS.  Baseline CBC notable for Hgb 9.7 (appears c/w BL). Baseline aPTT and PT-INR are pending.  Goal of Therapy:  Heparin level 0.3-0.7 units/ml Monitor platelets by anticoagulation protocol: Yes  08/10 2214 HL 0.18, subtherapeutic 08/11 0816 HL 0.50, therapeutic x1 @ 700 units/hr 08/11 1615 HL 0.49, therapeutic x 2  08/12 0453 HL 0.33, therapeutic x 3   Plan:  --Continue continuous infusion  rate of 700 units/hr --Since HL is near borderline, trending down, will recheck HL in 12 hrs. --Daily CBC per protocol while on IV heparin  Renda Rolls, PharmD, [facility] 09/05/2020 7:00 AM

## 2020-09-05 NOTE — Discharge Summary (Signed)
Physician Discharge Summary  Erica Mercer [id number] DOB: 1955/11/27 DOA: 09/03/2020  PCP: [name], NP  Admit date: 09/03/2020 Discharge date: 09/05/2020  Admitted From: Home Discharge disposition: [facility]   Code Status: Full Code   Discharge Diagnosis:   Principal Problem:   NSTEMI (non-ST elevated myocardial infarction) [facility]) Active Problems:   Severe protein-calorie malnutrition ([place])   Essential hypertension   HLD (hyperlipidemia)   COPD (chronic obstructive pulmonary disease) ([place])   Bipolar 1 disorder (HCC)   CAD (coronary artery disease)   Tobacco abuse   Depression   Elevated troponin   Protein-calorie malnutrition, moderate (HCC)   CKD (chronic kidney disease), stage IIIa   Bipolar depression [facility])     Chief Complaint  Patient presents with   Shortness of Breath    Brief narrative: Erica Mercer is a 65 y.o. female with PMH significant for insulin-dependent DM, HTN, HLD, chronic daily smoking, history of CAD, bipolar disorder medication noncompliance. Patient presented to the ED on 8/10 with complaint of shortness of breath, chest pressure, dizziness In the ED patient was afebrile, blood pressure was elevated to 162/104, heart rate elevated to 104, oxygen saturation was 83% on room air, improved to more than 90% on 2 L by nasal cannula. Labs with creatinine 1.48, blood glucose levels elevated to 638, troponin was elevated to 154 and further up to 237, hemoglobin low at 9.7  Admitted to hospitalist service. Cardiology consultation was called. See below for details.  Subjective: Patient was seen and examined this morning.  Pleasant elderly African-American female.  Not in distress. She underwent cardiac cath later this morning. Found to have a 95% stenosis in RCA with moderate calcification unable to intervene.  Cardiology recommended to transfer to [facility] for atherectomy.  Hospital course: NSTEMI History of CAD,  hyperlipidemia -Presented with shortness of breath, chest pressure, elevated troponin -EKG with nonspecific ST-T wave changes -Cardiology consult appreciated. -Initially started on heparin drip.  Underwent cardiac cath today 8/12.  Found to have 95% RCA stenosis with moderate calcification proximally.  An attempt was made to intervene on the proximal lesion but unable to cross with the balloon because of heavy calcification.  Case was discussed by the cardiologist with cardiology team at [facility].  Patient has been transferred to [facility] under hospitalist service.  Cardiologist Dr. Starleen Blue to do atherectomy and PCI.   -Currently on aspirin 81 mg daily, Coreg 25 mg twice daily, sublingual nitroglycerin as needed Recent Labs    09/03/20 1035 09/03/20 1221  TROPONINIHS 154* 237*   Uncontrolled type 2 diabetes mellitus Hyperglycemia/hypoglycemia No evidence of DKA -A1c significantly elevated to 15.4 on 08/20/2020. -Home meds include Lantus 20 units daily, Humalog 3 units 3 times daily -Diabetes coordinator consult proceeded. -Patient's blood sugar level has been fluctuating in a wide range from hypoglycemia to hyperglycemia.  She was hypoglycemic this morning -We will reduce Lantus dose for tonight to 18 units nightly.  Continue sliding scale insulin with Accu-Cheks. Recent Labs  Lab 09/05/20 0835 09/05/20 1011 09/05/20 1038 09/05/20 1154 09/05/20 1245  GLUCAP 166* 64* 124* 71 93   Hypertensive urgency -Her pressure was elevated up to 209/107 in the ED  -History of noncompliance to medications.  -Currently blood pressure is in 150s on Coreg 25 mg twice daily, amlodipine 10 mg daily, hydralazine 25 mg 3 times daily.    AKI on CKD 3B -Creatinine at baseline less than 1.7.  Was stable at presentation.  In last 24 hours,  creatinine has increased to 2.01.  Not on diuretics.  Received dye for cardiac cath today.  Monitor creatinine trend. Recent Labs    08/20/20 0415 08/20/20 0753  08/21/20 0432 08/22/20 0432 08/23/20 0650 09/03/20 1035 09/03/20 1838 09/04/20 0516 09/04/20 0924 09/05/20 0453  BUN 42* 39* 40* 47* 51* 46* 45* 47* 46* 58*  CREATININE 1.38* 1.19* 1.83* 2.08* 1.64* 1.48* 1.25* 1.36* 1.28* 2.01*   Moderate to severe protein malnutrition -mirtazapine 15 mg nightly resumed  Diabetic gastroparesis -Continue as needed Reglan, as needed Zofran   Depression/anxiety/grieving -Psychiatry evaluation obtained. -Continue Rexulti at 1 mg, continue Remeron at 30 mg  -Outpatient follow-up  GERD -Pepcid   Allergies as of 09/05/2020   No Known Allergies      Medication List     STOP taking these medications    Lantus SoloStar 100 UNIT/ML Solostar Pen Generic drug: insulin glargine   sucralfate 1 GM/10ML suspension Commonly known as: Carafate       TAKE these medications    albuterol 108 (90 Base) MCG/ACT inhaler Commonly known as: VENTOLIN HFA Inhale 2 puffs into the lungs every 6 (six) hours as needed for wheezing.   amLODipine 10 MG tablet Commonly known as: NORVASC Take 10 mg by mouth daily.   aspirin 81 MG EC tablet Take 1 tablet (81 mg total) by mouth daily. Swallow whole.   atorvastatin 40 MG tablet Commonly known as: LIPITOR Take 40 mg by mouth daily.   brexpiprazole 1 MG Tabs tablet Commonly known as: REXULTI Take 1 tablet (1 mg total) by mouth daily. Start taking on: September 06, 2020   carvedilol 25 MG tablet Commonly known as: COREG Take 1 tablet (25 mg total) by mouth 2 (two) times daily with a meal.   famotidine 20 MG tablet Commonly known as: PEPCID Take 1 tablet (20 mg total) by mouth at bedtime.   feeding supplement (NEPRO CARB STEADY) Liqd Take 237 mLs by mouth 2 (two) times daily between meals.   heparin 25000 UT/250ML infusion Inject 700 Units/hr into the vein continuous.   hydrALAZINE 25 MG tablet Commonly known as: APRESOLINE Take 1 tablet (25 mg total) by mouth every 8 (eight) hours.   insulin  glargine-yfgn 100 UNIT/ML injection Commonly known as: SEMGLEE Inject 0.18 mLs (18 Units total) into the skin at bedtime.   insulin lispro 100 UNIT/ML KwikPen Commonly known as: HUMALOG Inject 3 Units into the skin 3 (three) times daily before meals.   Insulin Pen Needle 32G X 4 MM Misc 1 Dose by Does not apply route 4 (four) times daily -  before meals and at bedtime.   metoCLOPramide 10 MG tablet Commonly known as: REGLAN Take 1 tablet (10 mg total) by mouth every 6 (six) hours as needed.   mirtazapine 15 MG tablet Commonly known as: REMERON Take 15 mg by mouth at bedtime.   pantoprazole 40 MG tablet Commonly known as: PROTONIX Take 1 tablet (40 mg total) by mouth 2 (two) times daily.        Discharge Instructions:  Diet Recommendation: Cardiac diet   Follow with Primary MD [name], NP in 7 days   Get CBC/BMP checked in next visit within 1 week by PCP or SNF MD ( we routinely change or add medications that can affect your baseline labs and fluid status, therefore we recommend that you get the mentioned basic workup next visit with your PCP, your PCP may decide not to get them or add new tests based on  their clinical decision)  On your next visit with your PCP, please Get Medicines reviewed and adjusted.  Please request your PCP  to go over all Hospital Tests and Procedure/Radiological results at the follow up, please get all Hospital records sent to your Prim MD by signing hospital release before you go home.  Activity: As tolerated with Full fall precautions use walker/cane & assistance as needed  For Heart failure patients - Check your Weight same time everyday, if you gain over 2 pounds, or you develop in leg swelling, experience more shortness of breath or chest pain, call your Primary MD immediately. Follow Cardiac Low Salt Diet and 1.5 lit/day fluid restriction.  If you have smoked or chewed Tobacco in the last 2 yrs please stop smoking, stop any regular  Alcohol  and or any Recreational drug use.  If you experience worsening of your admission symptoms, develop shortness of breath, life threatening emergency, suicidal or homicidal thoughts you must seek medical attention immediately by calling 911 or calling your MD immediately  if symptoms less severe.  You Must read complete instructions/literature along with all the possible adverse reactions/side effects for all the Medicines you take and that have been prescribed to you. Take any new Medicines after you have completely understood and accpet all the possible adverse reactions/side effects.   Do not drive, operate heavy machinery, perform activities at heights, swimming or participation in water activities or provide baby sitting services if your were admitted for syncope or siezures until you have seen by Primary MD or a Neurologist and advised to do so again.  Do not drive when taking Pain medications.  Do not take more than prescribed Pain, Sleep and Anxiety Medications  Wear Seat belts while driving.   Please note You were cared for by a hospitalist during your hospital stay. If you have any questions about your discharge medications or the care you received while you were in the hospital after you are discharged, you can call the unit and asked to speak with the hospitalist on call if the hospitalist that took care of you is not available. Once you are discharged, your primary care physician will handle any further medical issues. Please note that NO REFILLS for any discharge medications will be authorized once you are discharged, as it is imperative that you return to your primary care physician (or establish a relationship with a primary care physician if you do not have one) for your aftercare needs so that they can reassess your need for medications and monitor your lab values.    Follow ups:    Follow-up Information     [name], NP Follow up.   Specialty: Family  Medicine Contact information: [place] [address] [phone]                 Wound care:   Wound / Incision (Open or Dehisced) 05/05/20 Non-pressure wound Pretibial Left;Anterior open area on left shin, patient states she accidentally scratched her skin open (Active)  Date First Assessed/Time First Assessed: 05/05/20 1705   Wound Type: Non-pressure wound  Location: Pretibial  Location Orientation: Left;Anterior  Wound Description (Comments): open area on left shin, patient states she accidentally scratched her skin...    Assessments 05/05/2020  5:10 PM 05/06/2020 11:00 PM  Dressing Type Bismuth petroleum;Foam - Lift dressing to assess site every shift Foam - Lift dressing to assess site every shift  Dressing Changed New Changed  Dressing Status  Clean;Dry;Intact Clean;Intact  Dressing Change Frequency PRN --  Site / Wound Assessment Clean;Pink;Yellow Dressing in place / Unable to assess  % Wound base Red or Granulating 50% --  % Wound base Yellow/Fibrinous Exudate 50% --  % Wound base Black/Eschar 0% --  % Wound base Other/Granulation Tissue (Comment) 0% --  Peri-wound Assessment Intact --  Wound Length (cm) 2.5 cm --  Wound Width (cm) 1 cm --  Wound Depth (cm) 0 cm --  Wound Volume (cm^3) 0 cm^3 --  Wound Surface Area (cm^2) 2.5 cm^2 --  Tunneling (cm) 0 --  Undermining (cm) 0 --  Margins Attached edges (approximated) --  Closure None --  Drainage Amount None --  Treatment Cleansed --     No Linked orders to display    Discharge Exam:   Vitals:   09/05/20 0917 09/05/20 1057 09/05/20 1250 09/05/20 1300  BP: (!) 172/89  (!) 155/94 (!) 147/86  Pulse: 67  (!) 58 63  Resp: '20  10 19  '$ Temp: 97.8 F (36.6 C)     TempSrc: Oral     SpO2: 91% 97% 99% 98%  Weight: 43 kg     Height: '5\' 2"'$  (1.575 m)       Body mass index is 17.34 kg/m.  General exam: Elderly African-American female.  Not in distress Skin: No rashes,  lesions or ulcers. HEENT: Atraumatic, normocephalic, no obvious bleeding Lungs: Clear to auscultation bilaterally CVS: Regular rate and rhythm, normal GI/Abd soft, nontender, nondistended, bowel sound present CNS: Alert, awake, oriented x3 Psychiatry: Mood appropriate Extremities: No pedal edema, no calf tenderness  Time coordinating discharge: 35 minutes   The results of significant diagnostics from this hospitalization (including imaging, microbiology, ancillary and laboratory) are listed below for reference.    Procedures and Diagnostic Studies:   DG Chest 2 View  Result Date: 09/03/2020 CLINICAL DATA:  Shortness of breath.  Abdominal and back pain. EXAM: CHEST - 2 VIEW COMPARISON:  08/20/2020 FINDINGS: Borderline cardiomegaly with bilateral interstitial accentuation including some mild Kerley B lines, increased from 08/20/2020. Atherosclerotic calcification of the aortic arch. Trace fluid in the minor fissure. No blunting of the costophrenic angles. Chronic calcified lesion along the right lateral breast/axilla. IMPRESSION: 1. New abnormal interstitial accentuation in the lungs with borderline enlargement of the cardiopericardial silhouette and some Kerley B lines, suspicious for acute cardiogenic interstitial edema. Atypical pneumonia is a possible but less likely differential diagnostic consideration. 2.  Aortic Atherosclerosis (ICD10-I70.0). Electronically Signed   By: Van Clines M.D.   On: 09/03/2020 12:12     Labs:   Basic Metabolic Panel: Recent Labs  Lab 09/03/20 1035 09/03/20 1838 09/04/20 0516 09/04/20 0924 09/05/20 0453  NA 136 140 137 135 140  K 4.6 4.0 4.9 4.4 4.2  CL 99 106 104 102 107  CO2 '24 26 27 27 26  '$ GLUCOSE 638* 220* 279* 294* 134*  BUN 46* 45* 47* 46* 58*  CREATININE 1.48* 1.25* 1.36* 1.28* 2.01*  CALCIUM 9.4 9.7 9.4 9.4 8.7*   GFR Estimated Creatinine Clearance: 18.9 mL/min (A) (by C-G formula based on SCr of 2.01 mg/dL (H)). Liver Function  Tests: Recent Labs  Lab 09/03/20 1035  AST 86*  ALT 55*  ALKPHOS 143*  BILITOT 0.8  PROT 6.6  ALBUMIN 3.2*   No results for input(s): LIPASE, AMYLASE in the last 168 hours. No results for input(s): AMMONIA in the last 168 hours. Coagulation profile Recent Labs  Lab 09/03/20 1320 09/04/20 0924  INR 1.0  1.0    CBC: Recent Labs  Lab 09/03/20 1035 09/04/20 0516 09/04/20 0924 09/05/20 0453  WBC 11.1* 18.9* 17.4* 9.7  NEUTROABS  --   --   --  6.0  HGB 9.7* 9.2* 9.1* 8.2*  HCT 29.4* 27.2* 27.0* 24.9*  MCV 101.7* 100.4* 98.9 101.6*  PLT 318 336 316 279   Cardiac Enzymes: No results for input(s): CKTOTAL, CKMB, CKMBINDEX, TROPONINI in the last 168 hours. BNP: Invalid input(s): POCBNP CBG: Recent Labs  Lab 09/05/20 0835 09/05/20 1011 09/05/20 1038 09/05/20 1154 09/05/20 1245  GLUCAP 166* 64* 124* 71 93   D-Dimer No results for input(s): DDIMER in the last 72 hours. Hgb A1c No results for input(s): HGBA1C in the last 72 hours. Lipid Profile No results for input(s): CHOL, HDL, LDLCALC, TRIG, CHOLHDL, LDLDIRECT in the last 72 hours. Thyroid function studies Recent Labs    09/03/20 1320  TSH 1.029   Anemia work up No results for input(s): VITAMINB12, FOLATE, FERRITIN, TIBC, IRON, RETICCTPCT in the last 72 hours. Microbiology Recent Results (from the past 240 hour(s))  SARS CORONAVIRUS 2 (TAT 6-24 HRS) Nasopharyngeal Nasopharyngeal Swab     Status: None   Collection Time: 09/03/20  2:15 PM   Specimen: Nasopharyngeal Swab  Result Value Ref Range Status   SARS Coronavirus 2 NEGATIVE NEGATIVE Final    Comment: (NOTE) SARS-CoV-2 target nucleic acids are NOT DETECTED.  The SARS-CoV-2 RNA is generally detectable in upper and lower respiratory specimens during the acute phase of infection. Negative results do not preclude SARS-CoV-2 infection, do not rule out co-infections with other pathogens, and should not be used as the sole basis for treatment or other  patient management decisions. Negative results must be combined with clinical observations, patient history, and epidemiological information. The expected result is Negative.  Fact Sheet for Patients: SugarRoll.be  Fact Sheet for Healthcare Providers: https://www.woods-mathews.com/  This test is not yet approved or cleared by the [place] FDA and  has been authorized for detection and/or diagnosis of SARS-CoV-2 by FDA under an Emergency Use Authorization (EUA). This EUA will remain  in effect (meaning this test can be used) for the duration of the COVID-19 declaration under Se ction 564(b)(1) of the Act, 21 U.S.C. section 360bbb-3(b)(1), unless the authorization is terminated or revoked sooner.  Performed at [facility], [place] [address], [place], [place] [address]      Signed: Terrilee Croak  [facility] 09/05/2020, 1:16 PM

## 2020-09-05 NOTE — Progress Notes (Signed)
2 EMT's loading pt. Onto stretcher now. Pt. States back pain "almost gone" and "my chest is about a '4' " on scale 1-10. Pt. Calm, cooperative, laughing intermittently. Stable for tx to [facility] in [place]. O2 on at 2L/[place]. Report called to RN on 6East.

## 2020-09-05 NOTE — Consult Note (Addendum)
CARDIOLOGY CONSULT NOTE  Patient ID: Azharia Mikolajczak MRN: [id number] DOB/AGE: 1955/11/02 65 y.o.  Admit date: 09/05/2020 Referring Physician: [facility] hospitalist Reason for Consultation:  NSTEMI  HPI:   65 y.o. African American female  with hypertension, hyperlipidemia, uncontrolled type 2 DM (A1C 15%), bipolar disorder, NSTEMI  Patient presented to [facility] on 09/03/2020 with chest pain and shortness of breath, also found to have severe hyperglycemia, without DKA. HS trop was 237 on 8/10.  EKG showed inferolateral ST depressions. Diagnostic angiogram today by Dr. Neoma Laming showed severe calcific 95% mid RCA lesion, and probably non-critical but severe calcific 90% lesion in mid LAD. Dr.Callwood attempted intervention, was able to pass the wire but unable to pass the balloon due to severe calcification. Dr. Clayborn Bigness personally spoke with me and requested intervention at [facility], as atherectomy, shockwave options were not available at [facility] at that time. Fortunately, patient is stable without chest pain o shortness of breath at rest. However, given patients AKI with Cr of 2.0 today, with baseline of around 1.3, I requested transfer to medicine service for continued medical management of DM, and AKI, with plans for PCI on Monday 8/15.  Patient is currently living in [place] with her daughter, but supposed to move to [facility] to stay with her brother at the end of this month. There is concern about compliance at baseline.   Past Medical History:  Diagnosis Date   Bipolar 1 disorder ([place])    CAD (coronary artery disease)    Diabetes mellitus without complication ([place])    HLD (hyperlipidemia)    HTN (hypertension)      Past Surgical History:  Procedure Laterality Date   COLONOSCOPY     ESOPHAGOGASTRODUODENOSCOPY        Family History  Problem Relation Age of Onset   Heart disease Mother    Prostate cancer Father      Social History: Social History   Socioeconomic  History   Marital status: Single    Spouse name: Not on file   Number of children: Not on file   Years of education: Not on file   Highest education level: Not on file  Occupational History   Not on file  Tobacco Use   Smoking status: Every Day   Smokeless tobacco: Never  Vaping Use   Vaping Use: Never used  Substance and Sexual Activity   Alcohol use: Not Currently   Drug use: Yes    Frequency: 3.0 times per week    Types: Marijuana   Sexual activity: Not Currently  Other Topics Concern   Not on file  Social History Narrative   Not on file   Social Determinants of Health   Financial Resource Strain: Not on file  Food Insecurity: Not on file  Transportation Needs: Not on file  Physical Activity: Not on file  Stress: Not on file  Social Connections: Not on file  Intimate Partner Violence: Not on file     Medications Prior to Admission  Medication Sig Dispense Refill Last Dose   albuterol (VENTOLIN HFA) 108 (90 Base) MCG/ACT inhaler Inhale 2 puffs into the lungs every 6 (six) hours as needed for wheezing. 8 g 0    amLODipine (NORVASC) 10 MG tablet Take 10 mg by mouth daily.      aspirin EC 81 MG EC tablet Take 1 tablet (81 mg total) by mouth daily. Swallow whole. 30 tablet 0    atorvastatin (LIPITOR) 40 MG tablet Take 40 mg by mouth daily.      [  START ON 09/06/2020] brexpiprazole (REXULTI) 1 MG TABS tablet Take 1 tablet (1 mg total) by mouth daily. 30 tablet     carvedilol (COREG) 25 MG tablet Take 1 tablet (25 mg total) by mouth 2 (two) times daily with a meal. 60 tablet 0    famotidine (PEPCID) 20 MG tablet Take 1 tablet (20 mg total) by mouth at bedtime. 30 tablet 0    heparin 25000 UT/250ML infusion Inject 700 Units/hr into the vein continuous.      hydrALAZINE (APRESOLINE) 25 MG tablet Take 1 tablet (25 mg total) by mouth every 8 (eight) hours. 90 tablet 0    insulin glargine-yfgn (SEMGLEE) 100 UNIT/ML injection Inject 0.18 mLs (18 Units total) into the skin at  bedtime. 10 mL 11    insulin lispro (HUMALOG) 100 UNIT/ML KwikPen Inject 3 Units into the skin 3 (three) times daily before meals. 15 mL 0    Insulin Pen Needle 32G X 4 MM MISC 1 Dose by Does not apply route 4 (four) times daily -  before meals and at bedtime. 200 each 0    metoCLOPramide (REGLAN) 10 MG tablet Take 1 tablet (10 mg total) by mouth every 6 (six) hours as needed. 30 tablet 0    mirtazapine (REMERON) 15 MG tablet Take 15 mg by mouth at bedtime.      Nutritional Supplements (FEEDING SUPPLEMENT, NEPRO CARB STEADY,) LIQD Take 237 mLs by mouth 2 (two) times daily between meals.  0    pantoprazole (PROTONIX) 40 MG tablet Take 1 tablet (40 mg total) by mouth 2 (two) times daily. 60 tablet 0     Review of Systems  Constitutional: Negative for decreased appetite, malaise/fatigue, weight gain and weight loss.  HENT:  Negative for congestion.   Eyes:  Negative for visual disturbance.  Cardiovascular:  Positive for chest pain (Currently absent) and dyspnea on exertion. Negative for leg swelling, palpitations and syncope.  Respiratory:  Negative for cough.   Endocrine: Negative for cold intolerance.  Hematologic/Lymphatic: Does not bruise/bleed easily.  Skin:  Negative for itching and rash.  Musculoskeletal:  Negative for myalgias.  Gastrointestinal:  Negative for abdominal pain, nausea and vomiting.  Genitourinary:  Negative for dysuria.  Neurological:  Negative for dizziness and weakness.  Psychiatric/Behavioral:  The patient is not nervous/anxious.   All other systems reviewed and are negative.    Physical Exam: Physical Exam Vitals and nursing note reviewed.  Constitutional:      General: She is not in acute distress.    Appearance: She is well-developed and underweight.  HENT:     Head: Normocephalic and atraumatic.  Eyes:     Conjunctiva/sclera: Conjunctivae normal.     Pupils: Pupils are equal, round, and reactive to light.  Neck:     Vascular: No JVD.  Cardiovascular:      Rate and Rhythm: Normal rate and regular rhythm.     Pulses: Intact distal pulses.          Femoral pulses are 2+ on the right side and 2+ on the left side.      Dorsalis pedis pulses are 1+ on the right side and 0 on the left side.       Posterior tibial pulses are 1+ on the right side and 0 on the left side.     Heart sounds: No murmur heard.    Comments: Mynx placed Rt CFA Pulmonary:     Effort: Pulmonary effort is normal.     Breath sounds: Rales (  Rt basal) present. No wheezing.  Abdominal:     General: Bowel sounds are normal.     Palpations: Abdomen is soft.     Tenderness: There is no rebound.  Musculoskeletal:        General: No tenderness. Normal range of motion.     Left lower leg: No edema.  Lymphadenopathy:     Cervical: No cervical adenopathy.  Skin:    General: Skin is warm and dry.  Neurological:     Mental Status: She is alert and oriented to person, place, and time.     Cranial Nerves: No cranial nerve deficit.     Labs:   Lab Results  Component Value Date   WBC 9.7 09/05/2020   HGB 8.2 (L) 09/05/2020   HCT 24.9 (L) 09/05/2020   MCV 101.6 (H) 09/05/2020   PLT 279 09/05/2020    Recent Labs  Lab 09/03/20 1035 09/03/20 1838 09/05/20 0453  NA 136   < > 140  K 4.6   < > 4.2  CL 99   < > 107  CO2 24   < > 26  BUN 46*   < > 58*  CREATININE 1.48*   < > 2.01*  CALCIUM 9.4   < > 8.7*  PROT 6.6  --   --   BILITOT 0.8  --   --   ALKPHOS 143*  --   --   ALT 55*  --   --   AST 86*  --   --   GLUCOSE 638*   < > 134*   < > = values in this interval not displayed.    Lipid Panel     Component Value Date/Time   CHOL 267 (H) 08/21/2020 0432   TRIG 243 (H) 08/21/2020 0432   HDL 65 08/21/2020 0432   CHOLHDL 4.1 08/21/2020 0432   VLDL 49 (H) 08/21/2020 0432   LDLCALC 153 (H) 08/21/2020 0432    BNP (last 3 results) Recent Labs    09/03/20 1035  BNP 1,371.8*    HEMOGLOBIN A1C Lab Results  Component Value Date   HGBA1C 15.4 (H) 08/20/2020    MPG 395 08/20/2020    Cardiac Panel (last 3 results) Results for CATALEAH, BERON (MRN [id number]) as of 09/05/2020 21:54  Ref. Range 09/03/2020 10:35 09/03/2020 12:21  Troponin I (High Sensitivity) Latest Ref Range: <18 ng/L 154 (HH) 237 (HH)    TSH Recent Labs    08/01/20 0126 09/03/20 1320  TSH 1.432 1.029      Radiology: CARDIAC CATHETERIZATION  Result Date: 09/05/2020   Mid RCA lesion is 99% stenosed.   Prox LAD to Mid LAD lesion is 65% stenosed.   Mid Cx lesion is 40% stenosed. Critical lesion in mid RCA, needing PCI/DES, LAd lesion moderate to severe mid LAD treat medically.    Scheduled Meds:  [START ON 09/06/2020] amLODipine  10 mg Oral Daily   [START ON 09/06/2020] aspirin EC  81 mg Oral Daily   [START ON 09/06/2020] brexpiprazole  1 mg Oral Daily   carvedilol  25 mg Oral BID WC   famotidine  20 mg Oral QHS   [START ON 09/06/2020] feeding supplement (NEPRO CARB STEADY)  237 mL Oral BID BM   hydrALAZINE  25 mg Oral Q8H   [START ON 09/06/2020] insulin aspart  0-9 Units Subcutaneous TID WC   [START ON 09/06/2020] insulin aspart  3 Units Subcutaneous TID AC   isosorbide mononitrate  30 mg Oral Daily  mirtazapine  15 mg Oral QHS   nicotine  21 mg Transdermal Daily   pantoprazole  40 mg Oral BID   [START ON 09/06/2020] ticagrelor  90 mg Oral BID   Continuous Infusions:  sodium chloride 50 mL/hr at 09/05/20 2039   heparin 700 Units/hr (09/05/20 2042)   PRN Meds:.acetaminophen, albuterol, metoCLOPramide, nitroGLYCERIN, ondansetron (ZOFRAN) IV, oxyCODONE  CARDIAC STUDIES:  EKG 09/04/2020: Sinus rhythm Possible old anteroseptal infarct Inferolateral ST depression  Echocardiogram 07/31/2020:   1. Left ventricular ejection fraction, by estimation, is 60 to 65%. The  left ventricle has normal function. The left ventricle has no regional  wall motion abnormalities. There is moderate left ventricular hypertrophy.  Left ventricular diastolic  parameters are consistent with  Grade I diastolic dysfunction (impaired  relaxation). The average left ventricular global longitudinal strain is  -15.7 %.   2. Right ventricular systolic function is normal. The right ventricular  size is normal. There is normal pulmonary artery systolic pressure. The  estimated right ventricular systolic pressure is [id number] mmHg.   3. Tricuspid valve regurgitation is mild to moderate.    Assessment & Recommendations:   65 y.o. African American female  with hypertension, hyperlipidemia, uncontrolled type 2 DM (A1C 15%), bipolar disorder, NSTEMI  NSTEMI: Recurrent admissions with DKA, likely due to noncompliance as well as underlying CAD and NSTEMI. Culprit lesion mid RCA, also has severe non-culprit lesion in mid LAD Transferred for intervention, pending improvement in renal function It is imperative to take DAPT-ideally for 1 year. This remains a challenge in patient with non-compliance, as evident by uncontrolled type 1 DM. That said, she remains at risk of recurrent NSTEMI and DKA without successful revascularization. Patient is going to reportedly move to [facility] with her brother later this month. One option will be stage interventions, RCA now or LAD later. Alternatively, renal function permitting, could intervene on both lesions, especially given uncertainty re: follow up.  Will discuss compliance issues at length with patient and daughter. For now, continue Aspirin, heparin, statin, Brilinta 90 mg bid (Will need to confirm if she received 180 mg loading dose) Continue amlodipine, carvedilol. Hold ACEi/ARB givne AKI  HFpEF: Rales on exam. BNP 1100 Gave lasix 20 mg IV once. Needs strict I/O Check echcoardiogram  AKI: Needs close monitoring. Cr was higher even before diagnostic cath, so not CIN.  Will reassess after overnight I/O numbers.  Personally reviewed outside cath films, discussed with other healthcare providers Total time spent with patient was 110 minutes and greater than  50% of that time was spent in counseling and coordination care with the patient regarding complex decision making and discussion as state above.      Nigel Mormon, MD Pager: [phone] Office: [phone]

## 2020-09-05 NOTE — Progress Notes (Signed)
PT Cancellation Note  Patient Details Name: Erica Mercer MRN: [id number] DOB: April 21, 1955   Cancelled Treatment:    Reason Eval/Treat Not Completed: Patient at procedure or test/unavailable. Order received, pt chart reviewed. Pt out of room for cardiac catheterization. Per cardiology note, pt is being transferred to [facility] to a telemetry bed to attempt intervention on RCA lesion.   Patrina Levering PT, DPT 09/05/20 1:58 PM [id number]  [facility] Lab 09/05/2020, 1:54 PM

## 2020-09-05 NOTE — Progress Notes (Signed)
In room to prep patient for cath lab and assess. Patient is diaphoretic and lethargic. CBG 36. Pyxis unavailable, PO orange juice given. Re-check and CBG is 33. 50 ml amp of D50 admin. CBG recheck >200. Patient states she is feeling better. Answers questions appropriately. Lethargy resolved.

## 2020-09-05 NOTE — Consult Note (Signed)
[facility] for IV Heparin Indication: chest pain/ACS  Patient Measurements: Heparin Dosing Weight: 43 kg  Labs: Recent Labs    09/03/20 1035 09/03/20 1221 09/03/20 1320 09/03/20 1838 09/04/20 0516 09/04/20 0816 09/04/20 0924 09/04/20 1615 09/05/20 0453  HGB 9.7*  --   --   --  9.2*  --  9.1*  --  8.2*  HCT 29.4*  --   --   --  27.2*  --  27.0*  --  24.9*  PLT 318  --   --   --  336  --  316  --  279  APTT  --   --  25  --   --   --   --   --   --   LABPROT  --   --  13.5  --   --   --  12.9  --   --   INR  --   --  1.0  --   --   --  1.0  --   --   HEPARINUNFRC  --   --   --    < >  --  0.50  --  0.49 0.33  CREATININE 1.48*  --   --    < > 1.36*  --  1.28*  --  2.01*  TROPONINIHS 154* 237*  --   --   --   --   --   --   --    < > = values in this interval not displayed.     Estimated Creatinine Clearance: 18.9 mL/min (A) (by C-G formula based on SCr of 2.01 mg/dL (H)).   Medical History: Past Medical History:  Diagnosis Date   Bipolar 1 disorder ([place])    CAD (coronary artery disease)    Diabetes mellitus without complication ([place])    HLD (hyperlipidemia)    HTN (hypertension)     Medications:  No anticoagulation prior to admission per my chart review. Medication reconciliation is pending  Assessment: Patient is a 65 y/o F with medical history as above and including CAD / NSTEMI, cocaine abuse who presented to [facility] ED with SOB / hyperglycemia / hypertensive emergency. Troponin elevated to 154. Pharmacy consulted for IV heparin for ACS.  Heparin level at goal on 700 units/hr this morning at [facility]. Hgb low at 8.2 but has been stable in 9s. Plt count wnl. No bleeding issues noted. Attempted balloon angioplasty of RCA this am and unsuccessful results, patient transferred to [facility] for atherectomy on Monday.   Goal of Therapy:  Heparin level 0.3-0.7 units/ml Monitor platelets by anticoagulation protocol: Yes  08/10 2214 HL 0.18,  subtherapeutic 08/11 0816 HL 0.50, therapeutic x1 @ 700 units/hr 08/11 1615 HL 0.49, therapeutic x 2  08/12 0453 HL 0.33, therapeutic x 3   Plan:  --Restart heparin at 700 units/hr --Sheath pulled ~1230 will start heparin 8 hours after sheath pull  Erin Hearing PharmD., BCPS Clinical Pharmacist 09/05/2020 6:37 PM

## 2020-09-05 NOTE — H&P (Addendum)
History and Physical    Erica Mercer [id number] DOB: 1955/09/23 DOA: 09/05/2020  PCP: [name], NP (Confirm with patient/family/NH records and if not entered, this has to be entered at [facility] point of entry) Patient coming from: [facility]  I have personally briefly reviewed patient's old medical records in [place]  Chief Complaint: Chest pain  HPI: Erica Mercer is a 65 y.o. female with medical history significant of IDDM, HTN, HLD, chronic anemia, cigarette smoker, bipolar disorder, presented with SOB and chest pain.  Patient woke up yesterday morning with severe 8/10 pressure-like chest pain along with shortness of breath and lightheaded.  Went to [place] ED, but it was found that her blood pressure significant elevated 160/100, O2 saturation 93% on room air.  Lab work showed creatinine 1.48, troponin 154> 237.  Patient shifted to Cath Lab, left-sided cardiac cath showed 90% RCA stenosis with severe calcification, unable to be ballooned.  Interventional cardiology at [facility], had interventional cardiology at [facility] to recommend patient transferred to [facility] for further intervention of RCA Monday.  There is also increase of her chronic level, patient was started on IV fluid for kidney protection for the incoming cath on Monday.  At the time I saw the patient, she still has 2-3/10 chest pain, no more shortness of breath or lightheadedness.  Review of Systems: As per HPI otherwise 14 point review of systems negative.    Past Medical History:  Diagnosis Date   Bipolar 1 disorder ([place])    CAD (coronary artery disease)    Diabetes mellitus without complication ([place])    HLD (hyperlipidemia)    HTN (hypertension)     Past Surgical History:  Procedure Laterality Date   COLONOSCOPY     ESOPHAGOGASTRODUODENOSCOPY       reports that she has been smoking. She has never used smokeless tobacco. She reports that she does not currently use alcohol. She reports current drug  use. Frequency: 3.00 times per week. Drug: Marijuana.  No Known Allergies  Family History  Problem Relation Age of Onset   Heart disease Mother    Prostate cancer Father      Prior to Admission medications   Medication Sig Start Date End Date Taking? Authorizing Provider  albuterol (VENTOLIN HFA) 108 (90 Base) MCG/ACT inhaler Inhale 2 puffs into the lungs every 6 (six) hours as needed for wheezing. 05/07/20 05/02/21  Loletha Grayer, MD  amLODipine (NORVASC) 10 MG tablet Take 10 mg by mouth daily.    [provider]  aspirin EC 81 MG EC tablet Take 1 tablet (81 mg total) by mouth daily. Swallow whole. 08/04/20   Loletha Grayer, MD  atorvastatin (LIPITOR) 40 MG tablet Take 40 mg by mouth daily.    [provider]  brexpiprazole (REXULTI) 1 MG TABS tablet Take 1 tablet (1 mg total) by mouth daily. 09/06/20   Terrilee Croak, MD  carvedilol (COREG) 25 MG tablet Take 1 tablet (25 mg total) by mouth 2 (two) times daily with a meal. 08/23/20   Sharen Hones, MD  famotidine (PEPCID) 20 MG tablet Take 1 tablet (20 mg total) by mouth at bedtime. 08/03/20   Loletha Grayer, MD  heparin 25000 UT/250ML infusion Inject 700 Units/hr into the vein continuous. 09/05/20   Terrilee Croak, MD  hydrALAZINE (APRESOLINE) 25 MG tablet Take 1 tablet (25 mg total) by mouth every 8 (eight) hours. 08/23/20   Sharen Hones, MD  insulin glargine-yfgn (SEMGLEE) 100 UNIT/ML injection Inject 0.18 mLs (18 Units total) into  the skin at bedtime. 09/05/20   Terrilee Croak, MD  insulin lispro (HUMALOG) 100 UNIT/ML KwikPen Inject 3 Units into the skin 3 (three) times daily before meals. 08/23/20   Sharen Hones, MD  Insulin Pen Needle 32G X 4 MM MISC 1 Dose by Does not apply route 4 (four) times daily -  before meals and at bedtime. 08/03/20   Loletha Grayer, MD  metoCLOPramide (REGLAN) 10 MG tablet Take 1 tablet (10 mg total) by mouth every 6 (six) hours as needed. 06/16/20   Carrie Mew, MD  mirtazapine (REMERON) 15  MG tablet Take 15 mg by mouth at bedtime. 08/14/20   [provider]  Nutritional Supplements (FEEDING SUPPLEMENT, NEPRO CARB STEADY,) LIQD Take 237 mLs by mouth 2 (two) times daily between meals. 09/05/20   Terrilee Croak, MD  pantoprazole (PROTONIX) 40 MG tablet Take 1 tablet (40 mg total) by mouth 2 (two) times daily. 08/03/20   Loletha Grayer, MD    Physical Exam: Vitals:   09/05/20 1600  BP: (!) 164/87  Pulse: 66  Resp: 16  Temp: 98.1 F (36.7 C)  TempSrc: Oral  SpO2: 100%    Constitutional: NAD, calm, comfortable Vitals:   09/05/20 1600  BP: (!) 164/87  Pulse: 66  Resp: 16  Temp: 98.1 F (36.7 C)  TempSrc: Oral  SpO2: 100%   Eyes: PERRL, lids and conjunctivae normal ENMT: Mucous membranes are moist. Posterior pharynx clear of any exudate or lesions.Normal dentition.  Neck: normal, supple, no masses, no thyromegaly Respiratory: clear to auscultation bilaterally, no wheezing, fine crackles on bilateral lower fields. Normal respiratory effort. No accessory muscle use.  Cardiovascular: Regular rate and rhythm, no murmurs / rubs / gallops. No extremity edema. 2+ pedal pulses. No carotid bruits.  Abdomen: no tenderness, no masses palpated. No hepatosplenomegaly. Bowel sounds positive.  Musculoskeletal: no clubbing / cyanosis. No joint deformity upper and lower extremities. Good ROM, no contractures. Normal muscle tone.  Skin: no rashes, lesions, ulcers. No induration Neurologic: CN 2-12 grossly intact. Sensation intact, DTR normal. Strength 5/5 in all 4.  Psychiatric: Normal judgment and insight. Alert and oriented x 3. Normal mood.     Labs on Admission: I have personally reviewed following labs and imaging studies  CBC: Recent Labs  Lab 09/03/20 1035 09/04/20 0516 09/04/20 0924 09/05/20 0453  WBC 11.1* 18.9* 17.4* 9.7  NEUTROABS  --   --   --  6.0  HGB 9.7* 9.2* 9.1* 8.2*  HCT 29.4* 27.2* 27.0* 24.9*  MCV 101.7* 100.4* 98.9 101.6*  PLT 318 336 316 [id number]    Basic Metabolic Panel: Recent Labs  Lab 09/03/20 1035 09/03/20 1838 09/04/20 0516 09/04/20 0924 09/05/20 0453  NA 136 140 137 135 140  K 4.6 4.0 4.9 4.4 4.2  CL 99 106 104 102 107  CO2 '24 26 27 27 26  '$ GLUCOSE 638* 220* 279* 294* 134*  BUN 46* 45* 47* 46* 58*  CREATININE 1.48* 1.25* 1.36* 1.28* 2.01*  CALCIUM 9.4 9.7 9.4 9.4 8.7*   GFR: Estimated Creatinine Clearance: 18.9 mL/min (A) (by C-G formula based on SCr of 2.01 mg/dL (H)). Liver Function Tests: Recent Labs  Lab 09/03/20 1035  AST 86*  ALT 55*  ALKPHOS 143*  BILITOT 0.8  PROT 6.6  ALBUMIN 3.2*   No results for input(s): LIPASE, AMYLASE in the last 168 hours. No results for input(s): AMMONIA in the last 168 hours. Coagulation Profile: Recent Labs  Lab 09/03/20 1320 09/04/20 0924  INR 1.0 1.0  Cardiac Enzymes: No results for input(s): CKTOTAL, CKMB, CKMBINDEX, TROPONINI in the last 168 hours. BNP (last 3 results) No results for input(s): PROBNP in the last 8760 hours. HbA1C: No results for input(s): HGBA1C in the last 72 hours. CBG: Recent Labs  Lab 09/05/20 1038 09/05/20 1154 09/05/20 1245 09/05/20 1408 09/05/20 1627  GLUCAP 124* 71 93 127* 150*   Lipid Profile: No results for input(s): CHOL, HDL, LDLCALC, TRIG, CHOLHDL, LDLDIRECT in the last 72 hours. Thyroid Function Tests: Recent Labs    09/03/20 1320  TSH 1.029   Anemia Panel: No results for input(s): VITAMINB12, FOLATE, FERRITIN, TIBC, IRON, RETICCTPCT in the last 72 hours. Urine analysis:    Component Value Date/Time   COLORURINE YELLOW (A) 09/03/2020 2214   APPEARANCEUR HAZY (A) 09/03/2020 2214   LABSPEC 1.011 09/03/2020 2214   PHURINE 5.0 09/03/2020 2214   GLUCOSEU >=500 (A) 09/03/2020 2214   HGBUR NEGATIVE 09/03/2020 2214   BILIRUBINUR NEGATIVE 09/03/2020 2214   KETONESUR NEGATIVE 09/03/2020 2214   PROTEINUR 100 (A) 09/03/2020 2214   NITRITE NEGATIVE 09/03/2020 2214   LEUKOCYTESUR NEGATIVE 09/03/2020 2214     Radiological Exams on Admission: CARDIAC CATHETERIZATION  Result Date: 09/05/2020   Mid RCA lesion is 99% stenosed.   Prox LAD to Mid LAD lesion is 65% stenosed.   Mid Cx lesion is 40% stenosed. Critical lesion in mid RCA, needing PCI/DES, LAd lesion moderate to severe mid LAD treat medically.    EKG: Independently reviewed.  Sinus, no acute ST-T changes  Assessment/Plan Active Problems:   Non-ST elevation (NSTEMI) myocardial infarction [facility])   NSTEMI (non-ST elevated myocardial infarction) ([place])  (please populate well all problems here in Problem List. (For example, if patient is on BP meds at home and you resume or decide to hold them, it is a problem that needs to be her. Same for CAD, COPD, HLD and so on)  Non-STEMI -Discussed with cardiology, recommend aspirin Brilinta, hold statin for elevated LFTs. -With the heavily calcified RCA lesion, cardiology plans for Monday cath and intervention. -Continue heparin drip.  Acute diastolic CHF -Blood pressure significantly elevated, patient has a history of noncompliant with BP meds.  X-ray shows lung congested, physical exam showed bilateral crackles.  Expect decompensation will improve with better control BP.  Discussed with cardiology, ordered 1 dose of 20 mg IV Lasix.  We will decreased IVF to 50 mL/h for kidney protection for the incoming cardiac cath.  AKI on CKD stage II -On low rate of IVF for kidney protection -Baseline creatinine 1.4 as shown on the [facility] record on April 17, 2020.  Transaminitis acute -Likely from decompensated acute CHF congestion.  Treatment as above. -Hold off statin until LFT stable.  IDDM with hypoglycemia -A1c 15 in July, indicating noncompliance.  But has a labile glucose control in the last 2 days.  Will discontinue Lantus due to AKI.  Start sliding scale.  And continue NovoLog 3 units 3 times daily AC.  Uncontrolled HTN -Continue Coreg, continue amlodipine while monitoring CHF condition, start  Imdur and hydralazine regimen.  Severe protein calorie malnutrition -BMI 17 with unintentional weight loss -Continue protein supplement, nutrition evaluation.  Cigarette smoke -Nicotine patch.  DVT prophylaxis: On drip Code Status: Full code Family Communication: None at bedside Disposition Plan: Expect more than 2 midnight hospital stay for Monday cath. Consults called: Cardiology Admission status: Telemetry admission   Lequita Halt MD [facility] Pager [phone]  09/05/2020, 6:24 PM

## 2020-09-05 NOTE — Progress Notes (Signed)
Received transfer request from cardiology Dr. Clayborn Bigness, who found heavily calcified RCA lesion in today's cath unable to pass through balloon. Cardiology at [facility] will attempt intervention on the RCA lesion on Monday and patient accepted to [facility] Tele bed.  Patient has a baseline CKD II, and now there is a increase on her creatinine, as per cardiology there is no S/S of fluidoverload or CHF, and patient is no continuous IVF for the incoming cath.

## 2020-09-06 ENCOUNTER — Inpatient Hospital Stay (HOSPITAL_COMMUNITY): Payer: Medicare Other

## 2020-09-06 ENCOUNTER — Encounter (HOSPITAL_COMMUNITY): Payer: Self-pay | Admitting: Internal Medicine

## 2020-09-06 DIAGNOSIS — Z794 Long term (current) use of insulin: Secondary | ICD-10-CM

## 2020-09-06 DIAGNOSIS — N1831 Chronic kidney disease, stage 3a: Secondary | ICD-10-CM

## 2020-09-06 DIAGNOSIS — F319 Bipolar disorder, unspecified: Secondary | ICD-10-CM

## 2020-09-06 DIAGNOSIS — J449 Chronic obstructive pulmonary disease, unspecified: Secondary | ICD-10-CM

## 2020-09-06 DIAGNOSIS — E782 Mixed hyperlipidemia: Secondary | ICD-10-CM

## 2020-09-06 DIAGNOSIS — N1832 Chronic kidney disease, stage 3b: Secondary | ICD-10-CM

## 2020-09-06 DIAGNOSIS — E1122 Type 2 diabetes mellitus with diabetic chronic kidney disease: Secondary | ICD-10-CM

## 2020-09-06 LAB — HEPARIN LEVEL (UNFRACTIONATED)
Heparin Unfractionated: 0.28 IU/mL — ABNORMAL LOW (ref 0.30–0.70)
Heparin Unfractionated: 0.39 IU/mL (ref 0.30–0.70)

## 2020-09-06 LAB — GLUCOSE, CAPILLARY
Glucose-Capillary: 552 mg/dL (ref 70–99)
Glucose-Capillary: 558 mg/dL (ref 70–99)
Glucose-Capillary: 399 mg/dL — ABNORMAL HIGH (ref 70–99)
Glucose-Capillary: 84 mg/dL (ref 70–99)
Glucose-Capillary: 52 mg/dL — ABNORMAL LOW (ref 70–99)
Glucose-Capillary: 82 mg/dL (ref 70–99)

## 2020-09-06 LAB — BASIC METABOLIC PANEL
Anion gap: 7 (ref 5–15)
BUN: 47 mg/dL — ABNORMAL HIGH (ref 8–23)
CO2: 24 mmol/L (ref 22–32)
Calcium: 8.7 mg/dL — ABNORMAL LOW (ref 8.9–10.3)
Chloride: 104 mmol/L (ref 98–111)
Creatinine, Ser: 1.79 mg/dL — ABNORMAL HIGH (ref 0.44–1.00)
GFR, Estimated: 31 mL/min — ABNORMAL LOW (ref >60)
Glucose, Bld: 483 mg/dL — ABNORMAL HIGH (ref 70–99)
Potassium: 4.4 mmol/L (ref 3.5–5.1)
Sodium: 135 mmol/L (ref 135–145)

## 2020-09-06 LAB — ECHOCARDIOGRAM COMPLETE
Area-P 1/2: 3.72 cm2
Height: 62 in
S' Lateral: 2.8 cm
Weight: 1516.76 oz

## 2020-09-06 LAB — HEPATIC FUNCTION PANEL
ALT: 51 U/L — ABNORMAL HIGH (ref 0–44)
AST: 109 U/L — ABNORMAL HIGH (ref 15–41)
Albumin: 2.4 g/dL — ABNORMAL LOW (ref 3.5–5.0)
Alkaline Phosphatase: 124 U/L (ref 38–126)
Bilirubin, Direct: <0.1 mg/dL (ref 0.0–0.2)
Total Bilirubin: 0.4 mg/dL (ref 0.3–1.2)
Total Protein: 5.1 g/dL — ABNORMAL LOW (ref 6.5–8.1)

## 2020-09-06 LAB — CBC
HCT: 26.7 % — ABNORMAL LOW (ref 36.0–46.0)
Hemoglobin: 8.5 g/dL — ABNORMAL LOW (ref 12.0–15.0)
MCH: 33.3 pg (ref 26.0–34.0)
MCHC: 31.8 g/dL (ref 30.0–36.0)
MCV: 104.7 fL — ABNORMAL HIGH (ref 80.0–100.0)
Platelets: 261 10*3/uL (ref 150–400)
RBC: 2.55 MIL/uL — ABNORMAL LOW (ref 3.87–5.11)
RDW: 14.9 % (ref 11.5–15.5)
WBC: 6.8 10*3/uL (ref 4.0–10.5)
nRBC: 0 % (ref 0.0–0.2)

## 2020-09-06 MED ORDER — INSULIN GLARGINE-YFGN 100 UNIT/ML ~~LOC~~ SOLN
18.0000 [IU] | Freq: Every day | SUBCUTANEOUS | Status: DC
  Administered 2020-09-06 – 2020-09-08 (×3): 18 [IU] via SUBCUTANEOUS
  Filled 2020-09-06 (×3): qty 0.18

## 2020-09-06 MED ORDER — ATORVASTATIN CALCIUM 10 MG PO TABS
20.0000 mg | ORAL_TABLET | Freq: Every day | ORAL | Status: DC
  Administered 2020-09-06: 20 mg via ORAL
  Filled 2020-09-06: qty 2

## 2020-09-06 MED ORDER — INSULIN ASPART 100 UNIT/ML IJ SOLN
10.0000 [IU] | Freq: Once | INTRAMUSCULAR | Status: AC
  Administered 2020-09-06: 10 [IU] via SUBCUTANEOUS

## 2020-09-06 MED ORDER — HYDROMORPHONE HCL 1 MG/ML IJ SOLN
0.5000 mg | Freq: Once | INTRAMUSCULAR | Status: AC
  Administered 2020-09-06: 0.5 mg via INTRAVENOUS
  Filled 2020-09-06: qty 1

## 2020-09-06 MED ORDER — POLYETHYLENE GLYCOL 3350 17 G PO PACK
17.0000 g | PACK | Freq: Every day | ORAL | Status: DC | PRN
  Administered 2020-09-10: 17 g via ORAL
  Filled 2020-09-06: qty 1

## 2020-09-06 MED ORDER — OXYCODONE HCL 5 MG PO TABS
5.0000 mg | ORAL_TABLET | Freq: Four times a day (QID) | ORAL | Status: DC | PRN
  Filled 2020-09-06 (×2): qty 1

## 2020-09-06 MED ORDER — ATORVASTATIN CALCIUM 40 MG PO TABS
40.0000 mg | ORAL_TABLET | Freq: Every day | ORAL | Status: DC
  Administered 2020-09-07 – 2020-09-18 (×12): 40 mg via ORAL
  Filled 2020-09-06 (×12): qty 1

## 2020-09-06 MED ORDER — HYDRALAZINE HCL 50 MG PO TABS
50.0000 mg | ORAL_TABLET | Freq: Three times a day (TID) | ORAL | Status: DC
  Administered 2020-09-06 – 2020-09-09 (×9): 50 mg via ORAL
  Filled 2020-09-06 (×9): qty 1

## 2020-09-06 MED ORDER — PERFLUTREN LIPID MICROSPHERE
1.0000 mL | INTRAVENOUS | Status: AC | PRN
  Administered 2020-09-06: 3 mL via INTRAVENOUS
  Filled 2020-09-06: qty 10

## 2020-09-06 MED ORDER — HYDROMORPHONE HCL 1 MG/ML IJ SOLN
1.0000 mg | INTRAMUSCULAR | Status: DC | PRN
  Administered 2020-09-06 – 2020-09-10 (×17): 1 mg via INTRAVENOUS
  Filled 2020-09-06 (×17): qty 1

## 2020-09-06 NOTE — Progress Notes (Signed)
ANTICOAGULATION CONSULT NOTE - Follow Up Consult  Pharmacy Consult for heparin Indication: chest pain/ACS  No Known Allergies  Patient Measurements: Height: '5\' 2"'$  (157.5 cm) Weight: 43 kg (94 lb 12.8 oz) IBW/kg (Calculated) : 50.1 Heparin Dosing Weight: 43 kg  Vital Signs: Temp: 99 F (37.2 C) (08/13 0455) Temp Source: Oral (08/13 0455) BP: 183/89 (08/13 0455) Pulse Rate: 75 (08/13 0455)  Labs: Recent Labs    09/03/20 1035 09/03/20 1221 09/03/20 1320 09/03/20 1838 09/04/20 0516 09/04/20 0816 09/04/20 0924 09/04/20 1615 09/05/20 0453 09/06/20 0530 09/06/20 0531  HGB 9.7*  --   --   --  9.2*  --  9.1*  --  8.2*  --  8.5*  HCT 29.4*  --   --   --  27.2*  --  27.0*  --  24.9*  --  26.7*  PLT 318  --   --   --  336  --  316  --  279  --  261  APTT  --   --  25  --   --   --   --   --   --   --   --   LABPROT  --   --  13.5  --   --   --  12.9  --   --   --   --   INR  --   --  1.0  --   --   --  1.0  --   --   --   --   HEPARINUNFRC  --   --   --    < >  --    < >  --  0.49 0.33 0.28*  --   CREATININE 1.48*  --   --    < > 1.36*  --  1.28*  --  2.01*  --   --   TROPONINIHS 154* 237*  --   --   --   --   --   --   --   --   --    < > = values in this interval not displayed.    Estimated Creatinine Clearance: 18.9 mL/min (A) (by C-G formula based on SCr of 2.01 mg/dL (H)).   Assessment: Pt was transferred from [facility] on 09/05/20 for atherectomy on Monday, 09/08/20 after an unsuccessful attempt at balloon angioplasty of RCA. Heparin gtt was paused while in cath lab and was resumed at previous rate of 700 units/hr w/ a 9h HL subtheraputic at 0.28.   H/H low but stable. Per convo with RN no s/sx bleeding or complications.   No plan to bolus given age, weight, and HL being close to goal range.   Plan for long term AC: per cardiology, DAPT x1 yr.  Goal of Therapy:  Heparin level 0.3-0.7 units/ml Monitor platelets by anticoagulation protocol: Yes   Plan:  Increase heparin  rate to 800 units/hr.  Check anti-Xa level in 8 hours and daily while on heparin Continue to monitor H&H and platelets  Pauletta Browns, Pharm.D. PGY-1 Ambulatory Care Resident [id number] 09/06/2020 8:43 AM

## 2020-09-06 NOTE — Progress Notes (Signed)
  Echocardiogram 2D Echocardiogram has been performed.  Erica Mercer 09/06/2020, 1:55 PM

## 2020-09-06 NOTE — Progress Notes (Signed)
Chaplain responded to Spiritual Consult for delivery of AD and for prayer.  Chaplain found Ms. Erica Mercer sitting in a chair at the window with the sun shining on her.  Ms. Erica Mercer explained she was grieving for her sister who died last week.  Ms. Erica Mercer shared her dream of being able to move out of her daughter's home and  to accept her brother's invitation to move in with him.  Ms. Erica Mercer hopes that dream can come true because she will be moving back into the family home in [place]. She will have a scooter there and be free!  Chaplain prayed with Ms. Erica Mercer.   Chaplain explained AD to Ms. Erica Mercer.  She wanted only to fill out part A.  The document is now ready for notary.  Please place referral in [place] for notary.  [place]

## 2020-09-06 NOTE — Progress Notes (Signed)
Initial Nutrition Assessment  DOCUMENTATION CODES:   Underweight  INTERVENTION:  Continue Nepro Shake po BID, each supplement provides 425 kcal and 19 grams protein.  Encourage adequate PO intake.   NUTRITION DIAGNOSIS:   Increased nutrient needs related to chronic illness (heart failure) as evidenced by estimated needs.  GOAL:   Patient will meet greater than or equal to 90% of their needs  MONITOR:   PO intake, Supplement acceptance, Skin, Labs, Weight trends, I & O's  REASON FOR ASSESSMENT:   Consult Assessment of nutrition requirement/status  ASSESSMENT:   65 year old female past medical history for DM, hypertension, dyslipidemia, chronic anemia, bipolar disorder who presented with chest pain. Pt admitted to the hospital working diagnosis of non-ST elevation myocardial infarction complicated by acute diastolic heart failure decompensation and acute kidney injury.  Pt unavailable during attempted time of contact. RD unable to obtain pt most recent nutrition history. Pt currently has Nepro shake ordered and has been consuming them. RD to continue with current orders to aid in caloric and protein needs. Unable to complete Nutrition-Focused physical exam at this time.   Labs and medications reviewed.   Diet Order:   Diet Order             Diet heart healthy/carb modified Room service appropriate? Yes; Fluid consistency: Thin  Diet effective now                   EDUCATION NEEDS:   Not appropriate for education at this time  Skin:  Skin Assessment: Reviewed RN Assessment  Last BM:  8/12  Height:   Ht Readings from Last 1 Encounters:  09/05/20 '5\' 2"'$  (1.575 m)    Weight:   Wt Readings from Last 1 Encounters:  09/05/20 43 kg    Ideal Body Weight:  50 kg  BMI:  Body mass index is 17.34 kg/m.  Estimated Nutritional Needs:   Kcal:  1600-1800  Protein:  75-90 grams  Fluid:  >/= 1.6 L/day  Corrin Parker, MS, RD, LDN RD pager number/after  hours weekend pager number on Amion.

## 2020-09-06 NOTE — Progress Notes (Signed)
ANTICOAGULATION CONSULT NOTE - Follow Up Consult  Pharmacy Consult for heparin Indication: chest pain/ACS  No Known Allergies  Patient Measurements: Height: '5\' 2"'$  (157.5 cm) Weight: 43 kg (94 lb 12.8 oz) IBW/kg (Calculated) : 50.1 Heparin Dosing Weight: 43 kg  Vital Signs: BP: 123/72 (08/13 1415)  Labs: Recent Labs    09/04/20 0924 09/04/20 1615 09/05/20 0453 09/06/20 0530 09/06/20 0531 09/06/20 1738  HGB 9.1*  --  8.2*  --  8.5*  --   HCT 27.0*  --  24.9*  --  26.7*  --   PLT 316  --  279  --  261  --   LABPROT 12.9  --   --   --   --   --   INR 1.0  --   --   --   --   --   HEPARINUNFRC  --    < > 0.33 0.28*  --  0.39  CREATININE 1.28*  --  2.01* 1.79*  --   --    < > = values in this interval not displayed.     Estimated Creatinine Clearance: 21.3 mL/min (A) (by C-G formula based on SCr of 1.79 mg/dL (H)).   Assessment: Pt was transferred from [facility] on 09/05/20 for atherectomy on Monday, 09/08/20 after an unsuccessful attempt at balloon angioplasty of RCA. Heparin was resumed post cath -heparin level at goal on 800 units/hr  Goal of Therapy:  Heparin level 0.3-0.7 units/ml Monitor platelets by anticoagulation protocol: Yes   Plan:  -Continue heparin at 800 units/hr -Daily heparin level and CBC  Hildred Laser, PharmD Clinical Pharmacist **Pharmacist phone directory can now be found on [email or web address] ([email or web address]).  Listed under [place].

## 2020-09-06 NOTE — Progress Notes (Signed)
Subjective:  Feels well  Objective:  Vital Signs in the last 24 hours: Temp:  [97.8 F (36.6 C)-99 F (37.2 C)] 99 F (37.2 C) (08/13 0455) Pulse Rate:  [58-75] 75 (08/13 0455) Resp:  [10-23] 16 (08/12 1600) BP: (117-183)/(76-137) 171/92 (08/13 0915) SpO2:  [90 %-100 %] 90 % (08/13 0455) Weight:  [43 kg] 43 kg (08/12 1947)  Intake/Output from previous day: 08/12 0701 - 08/13 0700 In: 1192.3 [P.O.:720; I.V.:472.3] Out: -   Physical Exam Vitals and nursing note reviewed.  Constitutional:      General: She is not in acute distress.    Appearance: She is well-developed and underweight.  HENT:     Head: Normocephalic and atraumatic.  Eyes:     Conjunctiva/sclera: Conjunctivae normal.     Pupils: Pupils are equal, round, and reactive to light.  Neck:     Vascular: No JVD.  Cardiovascular:     Rate and Rhythm: Normal rate and regular rhythm.     Pulses: Normal pulses and intact distal pulses.     Heart sounds: No murmur heard. Pulmonary:     Effort: Pulmonary effort is normal.     Breath sounds: Normal breath sounds. No wheezing or rales.  Abdominal:     General: Bowel sounds are normal.     Palpations: Abdomen is soft.     Tenderness: There is no rebound.  Musculoskeletal:        General: No tenderness. Normal range of motion.     Left lower leg: No edema.  Lymphadenopathy:     Cervical: No cervical adenopathy.  Skin:    General: Skin is warm and dry.  Neurological:     Mental Status: She is alert and oriented to person, place, and time.     Cranial Nerves: No cranial nerve deficit.     Lab Results: BMP Recent Labs    09/04/20 0924 09/05/20 0453 09/06/20 0530  NA 135 140 135  K 4.4 4.2 4.4  CL 102 107 104  CO2 '27 26 24  '$ GLUCOSE 294* 134* 483*  BUN 46* 58* 47*  CREATININE 1.28* 2.01* 1.79*  CALCIUM 9.4 8.7* 8.7*  GFRNONAA 46* 27* 31*    CBC Recent Labs  Lab 09/05/20 0453 09/05/20 2017 09/06/20 0531  WBC 9.7  --  6.8  RBC 2.45*   < > 2.55*   HGB 8.2*  --  8.5*  HCT 24.9*  --  26.7*  PLT 279  --  261  MCV 101.6*  --  104.7*  MCH 33.5  --  33.3  MCHC 32.9  --  31.8  RDW 15.1  --  14.9  LYMPHSABS 3.2  --   --   MONOABS 0.3  --   --   EOSABS 0.1  --   --   BASOSABS 0.0  --   --    < > = values in this interval not displayed.    HEMOGLOBIN A1C Lab Results  Component Value Date   HGBA1C 15.4 (H) 08/20/2020   MPG 395 08/20/2020    Cardiac Panel (last 3 results) No results for input(s): CKTOTAL, CKMB, TROPONINI, RELINDX in the last 8760 hours.  BNP (last 3 results) Recent Labs    09/03/20 1035  BNP 1,371.8*    TSH Recent Labs    08/01/20 0126 09/03/20 1320  TSH 1.432 1.029    Lipid Panel     Component Value Date/Time   CHOL 267 (H) 08/21/2020 0432   TRIG 243 (H)  08/21/2020 0432   HDL 65 08/21/2020 0432   CHOLHDL 4.1 08/21/2020 0432   VLDL 49 (H) 08/21/2020 0432   LDLCALC 153 (H) 08/21/2020 0432     Hepatic Function Panel Recent Labs    08/20/20 0002 09/03/20 1035 09/06/20 0530  PROT 6.7 6.6 5.1*  ALBUMIN 3.7 3.2* 2.4*  AST 36 86* 109*  ALT 38 55* 51*  ALKPHOS 176* 143* 124  BILITOT 1.4* 0.8 0.4  BILIDIR  --  0.1 <0.1  IBILI  --  0.7 NOT CALCULATED     Cardiac Studies:  EKG 09/04/2020: Sinus rhythm Possible old anteroseptal infarct Inferolateral ST depression   Echocardiogram 09/06/2020:  1. Left ventricular ejection fraction, by estimation, is 55 to 60%. The  left ventricle has normal function. The left ventricle has no regional  wall motion abnormalities. There is mild left ventricular hypertrophy.  Left ventricular diastolic parameters  are consistent with Grade I diastolic dysfunction (impaired relaxation).   2. Right ventricular systolic function is normal. The right ventricular  size is normal. There is normal pulmonary artery systolic pressure.   3. Left atrial size was mildly dilated.   4. Possible pericardial cyst adjacent to basal inferolateral left  ventricle.   5.  No significant valvular abnormality. Normal right atrial pressure.   6. No significant change compared to previous study on 07/31/2020.   Coronary angiography 09/05/2020 [facility]):   Mid RCA lesion is 99% stenosed.   Prox LAD to Mid LAD lesion is 65% stenosed. (Pesonally reviewed. Appears 80%)   Mid Cx lesion is 40% stenosed.  Unsuccessful PTCA to mid RCA  Assessment & Recommendations:  65 y.o. African American female  with hypertension, hyperlipidemia, uncontrolled type 2 DM (A1C 15%), bipolar disorder, NSTEMI  NSTEMI: Recurrent admissions with DKA, likely due to noncompliance as well as underlying CAD and  Culprit lesion mid RCA, also has severe non-culprit lesion in mid LAD Ideally, needs RCA and LAD intervention. She has had recurrent hospitalizations with chest pain, shortness of breath and DKA. The latteer likely combination of inadequate diabetes treatment at baseline, as well as obstructive CAD. Compliance is a concern. I had a long discussion with the patient and daughter Audrea Muscat and emphazied the importance of DAPT compliance after complex PCI for NSTEMI. Both are agreeing with compliance. Patietn is going to move in with her brother in [place] later this month.  Subject to renal function, will consider RCA and LAD PCI  Type 2 DM: Needs aggressive management. Defer to primary team  Hyeprlipidemia: Increased lipitor to 40 mg  Hypertension: Continue baseline therapy  AKI: Improving. Cr down to 1.79     Nigel Mormon, MD Pager: [phone] Office: [phone]

## 2020-09-06 NOTE — Progress Notes (Addendum)
PROGRESS NOTE    Erica Mercer  [id number] DOB: 1955-04-03 DOA: 09/05/2020 PCP: [name], NP    Brief Narrative:  Mrs. Mccrackin was admitted to the hospital working diagnosis of non-ST elevation myocardial infarction complicated by acute diastolic heart failure decompensation and acute kidney injury.  65 year old female past medical history for type II Titus mellitus, hypertension, dyslipidemia, chronic anemia, bipolar disorder who presented with chest pain.  She woke up with severe chest pain 8 out of 10, pressure-like associated with dyspnea and lightheadedness.  In the ED she was diagnosed with non-ST relation myocardial infarction and underwent emergent cardiac catheterization, 90% stenosis of RCA with severe calcification.  Unable to intervene, transferred from [facility] to [facility] for further invasive procedures.  On her initial physical examination blood pressures was 160/100, heart rate 66, respirate 16, temperature 98.1, oxygen saturation 100%.  Her lungs had rales bilaterally, heart S1-S2, present, rhythmic, soft abdomen, no lower extremity edema.  Sodium 140, potassium 4.2, chloride 106, bicarb 26, glucose 134, BUN 58, creatinine 2.0, white count 9.7, hemoglobin 8.2, hematocrit 34.9, platelets 279. High sensitive troponin 154-237. SARS COVID-19 negative.  Urinalysis specific gravity 1.011. >  500 glucose. Toxicology screen positive for cannabinoid.  Chest radiograph with increased lung markings bilaterally, fluid in the right fissure.  EKG 102 bpm, normal axis, normal intervals, sinus rhythm, ST segment depression V5-V6 no significant ST wave changes, positive LVH.  Assessment & Plan:   Principal Problem:   Non-ST elevation (NSTEMI) myocardial infarction [facility]) Active Problems:   Severe protein-calorie malnutrition (HCC)   Essential hypertension   HLD (hyperlipidemia)   COPD (chronic obstructive pulmonary disease) (HCC)   Bipolar 1 disorder (HCC)   AKI (acute kidney  injury) ([place])   Stage 3a chronic kidney disease (HCC)   Type II diabetes mellitus with renal manifestations ([place])   NSTEMI. Patient with chest pain this am, improved after IV hydromorphone, no nausea or vomiting.   Plan to continue medical therapy with IV heparin, aspirin/ ticagrelor and carvedilol.  Continue with isosorbide and blood pressure control with amlodipine and hydralazine.  SL nitroglycerin. Follow on echocardiogram and further cardiology recommendations.  Continue pain control with oxycodone and hydromorphone.  Add statin therapy and check lipid profile.   2. T2DM with uncontrolled hypoglycemia/ hyperglycemia. Glucose > 500 mg/dl this am, will resume long acting insulin 18 units per her home regimen and will continue insulin sliding scale and pre-meal insulin.   3. AKI on CKD stage 3a renal function with serum cr at 1,79 with K at 4,4 and serum bicarbonate at 24. Continue close renal function monitoring, will hold on IV fluids for now, avoid hypotension or nephrotoxic medications   4. Acute diastolic heart failure, uncontrolled HTN/ hypertensive emergency.  Blood pressure this am [id number] systolic Continue amlodipine and isosorbide, will increase hydralazine to 50 mg tid.   5. Depression, severe calorie protein malnutrition.  Continue with nutritional supplements.  Continue mirtazapine and brexpiprazole   6. Chronic anemia. Hgb 8,5 and Hct 26,7 Continue close follow up.   Patient continue to be at high risk for worsening ACS   Status is: Inpatient  Remains inpatient appropriate because:Inpatient level of care appropriate due to severity of illness  Dispo: The patient is from: Home              Anticipated d/c is to: Home              Patient currently is not medically stable to d/c.   Difficult to place  patient No   DVT prophylaxis: Heparin   Code Status:   full  Family Communication:  No family at the bedside     Consultants:  Cardiology   Procedures:   Cardiac cath   Subjective: Patient with chest pain this am, improved with IV hydromorphone, no nausea or vomiting, mild dyspnea.   Objective: Vitals:   09/05/20 1920 09/05/20 1947 09/06/20 0455 09/06/20 0915  BP: 138/80  (!) 183/89 (!) 171/92  Pulse: 75  75   Resp:      Temp: 98.6 F (37 C)  99 F (37.2 C)   TempSrc: Oral  Oral   SpO2: 91%  90%   Weight:  43 kg    Height:  '5\' 2"'$  (1.575 m)      Intake/Output Summary (Last 24 hours) at 09/06/2020 1050 Last data filed at 09/06/2020 [id number] Gross per 24 hour  Intake 1319.74 ml  Output --  Net 1319.74 ml   Filed Weights   09/05/20 1947  Weight: 43 kg    Examination:   General: Not in pain or dyspnea, deconditioned  Neurology: Awake and alert, non focal  E ENT: no pallor, no icterus, oral mucosa moist Cardiovascular: No JVD. S1-S2 present, rhythmic, no gallops, rubs, or murmurs. Trace lower extremity edema. Pulmonary: positive breath sounds bilaterally, adequate air movement, no wheezing, rhonchi or rales. Gastrointestinal. Abdomen soft and non tender Skin. No rashes Musculoskeletal: no joint deformities     Data Reviewed: I have personally reviewed following labs and imaging studies  CBC: Recent Labs  Lab 09/03/20 1035 09/04/20 0516 09/04/20 0924 09/05/20 0453 09/06/20 0531  WBC 11.1* 18.9* 17.4* 9.7 6.8  NEUTROABS  --   --   --  6.0  --   HGB 9.7* 9.2* 9.1* 8.2* 8.5*  HCT 29.4* 27.2* 27.0* 24.9* 26.7*  MCV 101.7* 100.4* 98.9 101.6* 104.7*  PLT 318 336 316 279 0000000   Basic Metabolic Panel: Recent Labs  Lab 09/03/20 1838 09/04/20 0516 09/04/20 0924 09/05/20 0453 09/06/20 0530  NA 140 137 135 140 135  K 4.0 4.9 4.4 4.2 4.4  CL 106 104 102 107 104  CO2 '26 27 27 26 24  '$ GLUCOSE 220* 279* 294* 134* 483*  BUN 45* 47* 46* 58* 47*  CREATININE 1.25* 1.36* 1.28* 2.01* 1.79*  CALCIUM 9.7 9.4 9.4 8.7* 8.7*   GFR: Estimated Creatinine Clearance: 21.3 mL/min (A) (by C-G formula based on SCr of 1.79 mg/dL  (H)). Liver Function Tests: Recent Labs  Lab 09/03/20 1035 09/06/20 0530  AST 86* 109*  ALT 55* 51*  ALKPHOS 143* 124  BILITOT 0.8 0.4  PROT 6.6 5.1*  ALBUMIN 3.2* 2.4*   No results for input(s): LIPASE, AMYLASE in the last 168 hours. No results for input(s): AMMONIA in the last 168 hours. Coagulation Profile: Recent Labs  Lab 09/03/20 1320 09/04/20 0924  INR 1.0 1.0   Cardiac Enzymes: No results for input(s): CKTOTAL, CKMB, CKMBINDEX, TROPONINI in the last 168 hours. BNP (last 3 results) No results for input(s): PROBNP in the last 8760 hours. HbA1C: No results for input(s): HGBA1C in the last 72 hours. CBG: Recent Labs  Lab 09/05/20 1627 09/05/20 2053 09/05/20 2227 09/06/20 0737 09/06/20 0750  GLUCAP 150* 386* 368* 552* 558*   Lipid Profile: No results for input(s): CHOL, HDL, LDLCALC, TRIG, CHOLHDL, LDLDIRECT in the last 72 hours. Thyroid Function Tests: Recent Labs    09/03/20 1320  TSH 1.029   Anemia Panel: Recent Labs    09/05/20 2017  RETICCTPCT 4.0*      Radiology Studies: I have reviewed all of the imaging during this hospital visit personally     Scheduled Meds:  amLODipine  10 mg Oral Daily   aspirin EC  81 mg Oral Daily   brexpiprazole  1 mg Oral Daily   carvedilol  25 mg Oral BID WC   famotidine  20 mg Oral QHS   feeding supplement (NEPRO CARB STEADY)  237 mL Oral BID BM   hydrALAZINE  25 mg Oral Q8H   insulin aspart  0-5 Units Subcutaneous QHS   insulin aspart  0-9 Units Subcutaneous TID WC   insulin aspart  3 Units Subcutaneous TID AC   insulin glargine-yfgn  18 Units Subcutaneous Daily   isosorbide mononitrate  30 mg Oral Daily   mirtazapine  15 mg Oral QHS   nicotine  21 mg Transdermal Daily   pantoprazole  40 mg Oral BID   ticagrelor  90 mg Oral BID   Continuous Infusions:  sodium chloride 50 mL/hr at 09/06/20 0733   heparin 800 Units/hr (09/06/20 0908)     LOS: 1 day        Amaziah Raisanen Gerome Apley, MD

## 2020-09-07 DIAGNOSIS — N179 Acute kidney failure, unspecified: Secondary | ICD-10-CM

## 2020-09-07 LAB — CBC
HCT: 23.7 % — ABNORMAL LOW (ref 36.0–46.0)
Hemoglobin: 7.5 g/dL — ABNORMAL LOW (ref 12.0–15.0)
MCH: 32.5 pg (ref 26.0–34.0)
MCHC: 31.6 g/dL (ref 30.0–36.0)
MCV: 102.6 fL — ABNORMAL HIGH (ref 80.0–100.0)
Platelets: 237 10*3/uL (ref 150–400)
RBC: 2.31 MIL/uL — ABNORMAL LOW (ref 3.87–5.11)
RDW: 14.6 % (ref 11.5–15.5)
WBC: 8 10*3/uL (ref 4.0–10.5)
nRBC: 0 % (ref 0.0–0.2)

## 2020-09-07 LAB — BASIC METABOLIC PANEL
Anion gap: 8 (ref 5–15)
BUN: 42 mg/dL — ABNORMAL HIGH (ref 8–23)
CO2: 23 mmol/L (ref 22–32)
Calcium: 8.8 mg/dL — ABNORMAL LOW (ref 8.9–10.3)
Chloride: 108 mmol/L (ref 98–111)
Creatinine, Ser: 1.88 mg/dL — ABNORMAL HIGH (ref 0.44–1.00)
GFR, Estimated: 29 mL/min — ABNORMAL LOW (ref >60)
Glucose, Bld: 143 mg/dL — ABNORMAL HIGH (ref 70–99)
Potassium: 4.5 mmol/L (ref 3.5–5.1)
Sodium: 139 mmol/L (ref 135–145)

## 2020-09-07 LAB — GLUCOSE, CAPILLARY
Glucose-Capillary: 220 mg/dL — ABNORMAL HIGH (ref 70–99)
Glucose-Capillary: 213 mg/dL — ABNORMAL HIGH (ref 70–99)
Glucose-Capillary: 130 mg/dL — ABNORMAL HIGH (ref 70–99)
Glucose-Capillary: 111 mg/dL — ABNORMAL HIGH (ref 70–99)

## 2020-09-07 LAB — HEPARIN LEVEL (UNFRACTIONATED): Heparin Unfractionated: 0.38 IU/mL (ref 0.30–0.70)

## 2020-09-07 MED ORDER — ASPIRIN 81 MG PO CHEW
81.0000 mg | CHEWABLE_TABLET | ORAL | Status: AC
  Administered 2020-09-08: 81 mg via ORAL
  Filled 2020-09-07: qty 1

## 2020-09-07 MED ORDER — GABAPENTIN 600 MG PO TABS
300.0000 mg | ORAL_TABLET | Freq: Two times a day (BID) | ORAL | Status: DC
  Administered 2020-09-07 – 2020-09-10 (×6): 300 mg via ORAL
  Filled 2020-09-07 (×7): qty 1

## 2020-09-07 MED ORDER — SODIUM CHLORIDE 0.9 % WEIGHT BASED INFUSION
3.0000 mL/kg/h | INTRAVENOUS | Status: DC
  Administered 2020-09-08: 3 mL/kg/h via INTRAVENOUS

## 2020-09-07 MED ORDER — SODIUM CHLORIDE 0.9% FLUSH
3.0000 mL | Freq: Two times a day (BID) | INTRAVENOUS | Status: DC
Start: 2020-09-07 — End: 2020-09-09
  Administered 2020-09-08 – 2020-09-09 (×3): 3 mL via INTRAVENOUS

## 2020-09-07 MED ORDER — SODIUM CHLORIDE 0.9% FLUSH: 3.0000 mL | INTRAVENOUS | Status: DC | PRN

## 2020-09-07 MED ORDER — SODIUM CHLORIDE 0.9 % IV SOLN: 250.0000 mL | INTRAVENOUS | Status: DC | PRN

## 2020-09-07 MED ORDER — ANGIOPLASTY BOOK
Freq: Once | Status: AC
  Filled 2020-09-07: qty 1

## 2020-09-07 MED ORDER — SODIUM CHLORIDE 0.9 % IV SOLN: INTRAVENOUS | Status: DC

## 2020-09-07 MED ORDER — GABAPENTIN 600 MG PO TABS: 300.0000 mg | ORAL_TABLET | Freq: Three times a day (TID) | ORAL | Status: DC

## 2020-09-07 MED ORDER — SODIUM CHLORIDE 0.9 % WEIGHT BASED INFUSION
1.0000 mL/kg/h | INTRAVENOUS | Status: DC
  Administered 2020-09-08: 1 mL/kg/h via INTRAVENOUS

## 2020-09-07 NOTE — Progress Notes (Signed)
PROGRESS NOTE    Erica Mercer  [id number] DOB: 19-Nov-1955 DOA: 09/05/2020 PCP: [name], NP    Brief Narrative:  Erica Mercer was admitted to the hospital working diagnosis of non-ST elevation myocardial infarction complicated by acute diastolic heart failure decompensation and acute kidney injury.   65 year old female past medical history for type II Titus mellitus, hypertension, dyslipidemia, chronic anemia, bipolar disorder who presented with chest pain.  She woke up with severe chest pain 8 out of 10, pressure-like associated with dyspnea and lightheadedness.  In the ED she was diagnosed with non-ST relation myocardial infarction and underwent emergent cardiac catheterization, 90% stenosis of RCA with severe calcification.  Unable to intervene, transferred from [facility] to [facility] for further invasive procedures.  On her initial physical examination blood pressures was 160/100, heart rate 66, respirate 16, temperature 98.1, oxygen saturation 100%.  Her lungs had rales bilaterally, heart S1-S2, present, rhythmic, soft abdomen, no lower extremity edema.   Sodium 140, potassium 4.2, chloride 106, bicarb 26, glucose 134, BUN 58, creatinine 2.0, white count 9.7, hemoglobin 8.2, hematocrit 34.9, platelets 279. High sensitive troponin 154-237. SARS COVID-19 negative.   Urinalysis specific gravity 1.011. >  500 glucose. Toxicology screen positive for cannabinoid.   Chest radiograph with increased lung markings bilaterally, fluid in the right fissure.   EKG 102 bpm, normal axis, normal intervals, sinus rhythm, ST segment depression V5-V6 no significant ST wave changes, positive LVH.  Patient has been placed on heparin drip with plans for cath on 08/15 for possible revascularization    Assessment & Plan:   Principal Problem:   Non-ST elevation (NSTEMI) myocardial infarction [facility]) Active Problems:   Severe protein-calorie malnutrition ([place])   Essential hypertension   HLD  (hyperlipidemia)   COPD (chronic obstructive pulmonary disease) (HCC)   Bipolar 1 disorder (HCC)   AKI (acute kidney injury) ([name])   Stage 3a chronic kidney disease (HCC)   Type II diabetes mellitus with renal manifestations ([name])   NSTEMI. Today with no chest pain, no dyspnea.    Medical therapy with IV heparin, aspirin/ ticagrelor and carvedilol.  On isosorbide, amlodipine and hydralazine.  SL nitroglycerin. Statin therapy.   Echocardiogram with LV systolic function 55 to [id number] with no wall motion abnormalities. RV systolic function is preserved.   Pain control with oxycodone and hydromorphone.  Plan for cardiac catheterization in am.    2. T2DM with uncontrolled hypoglycemia/ hyperglycemia.  Patient is tolerating po well. Capillary glucose this am is 220. Continue basal insulin and insulin sliding scale.  Start patient on gabapentin for neuropathic pain, diabetic neuropathy,    3. AKI on CKD stage 3a  Renal panel for this am continue to be pending, will check new panel this PM.  Continue close monitoring of her renal function,  recommended low load of IV contrast.  Currently with isotonic saline at 50 ml per hr.    4. Acute diastolic heart failure, uncontrolled HTN/ hypertensive emergency.  Blood pressure control with amlodipine and isosorbide, hydralazine Systolic blood pressure this pm is 126 mmHg,   5. Depression, severe calorie protein malnutrition.  Nutritional supplements.  On mirtazapine and brexpiprazole    6. Chronic anemia. Stable hgb and hct.      Patient continue to be at high risk for worsening ACS  Status is: Inpatient  Remains inpatient appropriate because:Inpatient level of care appropriate due to severity of illness  Dispo: The patient is from: Home  Anticipated d/c is to: Home              Patient currently is not medically stable to d/c.   Difficult to place patient No   DVT prophylaxis: Heparin  Code Status:    full  Family  Communication:   No family at the bedside      Nutrition Status: Nutrition Problem: Increased nutrient needs Etiology: chronic illness (heart failure) Signs/Symptoms: estimated needs Interventions: Nepro shake     Consultants:  Cardiology     Subjective: Patient with no chest pain , no nausea or vomiting, out of bed to the chair.  Positive leg pain, neuropathic in nature.   Objective: Vitals:   09/06/20 1415 09/06/20 2053 09/07/20 0445 09/07/20 0805  BP: 123/72 133/76 (!) 166/93 (!) 178/90  Pulse:  72 75 78  Resp:  '17 16 17  '$ Temp:  99 F (37.2 C) 99 F (37.2 C) 98.6 F (37 C)  TempSrc:  Oral Oral Oral  SpO2:  94% 100% 97%  Weight:      Height:        Intake/Output Summary (Last 24 hours) at 09/07/2020 1229 Last data filed at 09/07/2020 0400 Gross per 24 hour  Intake 177.37 ml  Output --  Net 177.37 ml   Filed Weights   09/05/20 1947  Weight: 43 kg    Examination:   General: Not in pain or dyspnea, deconditioned  Neurology: Awake and alert, non focal  E ENT: no pallor, no icterus, oral mucosa moist Cardiovascular: No JVD. S1-S2 present, rhythmic, no gallops, rubs, or murmurs. No lower extremity edema. Pulmonary:positive breath sounds bilaterally, adequate air movement, no wheezing, rhonchi or rales. Gastrointestinal. Abdomen soft and non tender 'Skin. No rashes Musculoskeletal: no joint deformities     Data Reviewed: I have personally reviewed following labs and imaging studies  CBC: Recent Labs  Lab 09/04/20 0516 09/04/20 0924 09/05/20 0453 09/06/20 0531 09/07/20 0230  WBC 18.9* 17.4* 9.7 6.8 8.0  NEUTROABS  --   --  6.0  --   --   HGB 9.2* 9.1* 8.2* 8.5* 7.5*  HCT 27.2* 27.0* 24.9* 26.7* 23.7*  MCV 100.4* 98.9 101.6* 104.7* 102.6*  PLT 336 316 279 261 [id number]   Basic Metabolic Panel: Recent Labs  Lab 09/03/20 1838 09/04/20 0516 09/04/20 0924 09/05/20 0453 09/06/20 0530  NA 140 137 135 140 135  K 4.0 4.9 4.4 4.2 4.4  CL 106 104 102  107 104  CO2 '26 27 27 26 24  '$ GLUCOSE 220* 279* 294* 134* 483*  BUN 45* 47* 46* 58* 47*  CREATININE 1.25* 1.36* 1.28* 2.01* 1.79*  CALCIUM 9.7 9.4 9.4 8.7* 8.7*   GFR: Estimated Creatinine Clearance: 21.3 mL/min (A) (by C-G formula based on SCr of 1.79 mg/dL (H)). Liver Function Tests: Recent Labs  Lab 09/03/20 1035 09/06/20 0530  AST 86* 109*  ALT 55* 51*  ALKPHOS 143* 124  BILITOT 0.8 0.4  PROT 6.6 5.1*  ALBUMIN 3.2* 2.4*   No results for input(s): LIPASE, AMYLASE in the last 168 hours. No results for input(s): AMMONIA in the last 168 hours. Coagulation Profile: Recent Labs  Lab 09/03/20 1320 09/04/20 0924  INR 1.0 1.0   Cardiac Enzymes: No results for input(s): CKTOTAL, CKMB, CKMBINDEX, TROPONINI in the last 168 hours. BNP (last 3 results) No results for input(s): PROBNP in the last 8760 hours. HbA1C: No results for input(s): HGBA1C in the last 72 hours. CBG: Recent Labs  Lab 09/06/20 1616 09/06/20 2209  09/06/20 2243 09/07/20 0729 09/07/20 1145  GLUCAP 84 52* 82 220* 213*   Lipid Profile: No results for input(s): CHOL, HDL, LDLCALC, TRIG, CHOLHDL, LDLDIRECT in the last 72 hours. Thyroid Function Tests: No results for input(s): TSH, T4TOTAL, FREET4, T3FREE, THYROIDAB in the last 72 hours. Anemia Panel: Recent Labs    09/05/20 2017  RETICCTPCT 4.0*      Radiology Studies: I have reviewed all of the imaging during this hospital visit personally     Scheduled Meds:  amLODipine  10 mg Oral Daily   aspirin EC  81 mg Oral Daily   atorvastatin  40 mg Oral Daily   brexpiprazole  1 mg Oral Daily   carvedilol  25 mg Oral BID WC   famotidine  20 mg Oral QHS   feeding supplement (NEPRO CARB STEADY)  237 mL Oral BID BM   hydrALAZINE  50 mg Oral Q8H   insulin aspart  0-5 Units Subcutaneous QHS   insulin aspart  0-9 Units Subcutaneous TID WC   insulin aspart  3 Units Subcutaneous TID AC   insulin glargine-yfgn  18 Units Subcutaneous Daily   isosorbide  mononitrate  30 mg Oral Daily   mirtazapine  15 mg Oral QHS   nicotine  21 mg Transdermal Daily   pantoprazole  40 mg Oral BID   ticagrelor  90 mg Oral BID   Continuous Infusions:  sodium chloride     heparin 800 Units/hr (09/07/20 0305)     LOS: 2 days        Judd Mccubbin Gerome Apley, MD

## 2020-09-07 NOTE — Progress Notes (Signed)
ANTICOAGULATION CONSULT NOTE - Follow Up Consult  Pharmacy Consult for heparin Indication: chest pain/ACS  No Known Allergies  Patient Measurements: Height: '5\' 2"'$  (157.5 cm) Weight: 43 kg (94 lb 12.8 oz) IBW/kg (Calculated) : 50.1 Heparin Dosing Weight: 43 kg  Vital Signs: Temp: 98.6 F (37 C) (08/14 0805) Temp Source: Oral (08/14 0805) BP: 178/90 (08/14 0805) Pulse Rate: 78 (08/14 0805)  Labs: Recent Labs    09/04/20 0924 09/04/20 1615 09/05/20 0453 09/06/20 0530 09/06/20 0531 09/06/20 1738 09/07/20 0230  HGB 9.1*  --  8.2*  --  8.5*  --  7.5*  HCT 27.0*  --  24.9*  --  26.7*  --  23.7*  PLT 316  --  279  --  261  --  237  LABPROT 12.9  --   --   --   --   --   --   INR 1.0  --   --   --   --   --   --   HEPARINUNFRC  --    < > 0.33 0.28*  --  0.39 0.38  CREATININE 1.28*  --  2.01* 1.79*  --   --   --    < > = values in this interval not displayed.     Estimated Creatinine Clearance: 21.3 mL/min (A) (by C-G formula based on SCr of 1.79 mg/dL (H)).   Assessment: Pt was transferred from [facility] on 09[facility]/20 for atherectomy on Monday, 09/08/20 after an unsuccessful attempt at balloon angioplasty of RCA. Heparin was resumed post cath  Heparin level remains therapeutic on gtt at 800 units/h. Hgb 8.5>7.5, plt wnl. No bleeding or issues with infusion per RN.   Goal of Therapy:  Heparin level 0.3-0.7 units/ml Monitor platelets by anticoagulation protocol: Yes   Plan:  -Continue heparin at 800 units/hr -Daily heparin level and CBC -F/u PCI plans  Rebbeca Paul, PharmD PGY2 Ambulatory Care Pharmacy Resident 09/07/2020 9:08 AM

## 2020-09-07 NOTE — Progress Notes (Addendum)
Subjective:  Feels well No chest pain currently  Objective:  Vital Signs in the last 24 hours: Temp:  [98.6 F (37 C)-99 F (37.2 C)] 98.6 F (37 C) (08/14 0805) Pulse Rate:  [72-78] 78 (08/14 0805) Resp:  [16-17] 17 (08/14 0805) BP: (123-178)/(72-93) 178/90 (08/14 0805) SpO2:  [94 %-100 %] 97 % (08/14 0805)  Intake/Output from previous day: 08/13 0701 - 08/14 0700 In: 784.8 [P.O.:480; I.V.:304.8] Out: -   Physical Exam Vitals and nursing note reviewed.  Constitutional:      General: She is not in acute distress.    Appearance: She is well-developed and underweight.  HENT:     Head: Normocephalic and atraumatic.  Eyes:     Conjunctiva/sclera: Conjunctivae normal.     Pupils: Pupils are equal, round, and reactive to light.  Neck:     Vascular: No JVD.  Cardiovascular:     Rate and Rhythm: Normal rate and regular rhythm.     Pulses: Normal pulses and intact distal pulses.     Heart sounds: No murmur heard. Pulmonary:     Effort: Pulmonary effort is normal.     Breath sounds: Normal breath sounds. No wheezing or rales.  Abdominal:     General: Bowel sounds are normal.     Palpations: Abdomen is soft.     Tenderness: There is no rebound.  Musculoskeletal:        General: No tenderness. Normal range of motion.     Left lower leg: No edema.  Lymphadenopathy:     Cervical: No cervical adenopathy.  Skin:    General: Skin is warm and dry.  Neurological:     Mental Status: She is alert and oriented to person, place, and time.     Cranial Nerves: No cranial nerve deficit.   No change in physical exam compared to 09/06/2020  Lab Results: BMP Recent Labs    09/04/20 0924 09/05/20 0453 09/06/20 0530  NA 135 140 135  K 4.4 4.2 4.4  CL 102 107 104  CO2 '27 26 24  '$ GLUCOSE 294* 134* 483*  BUN 46* 58* 47*  CREATININE 1.28* 2.01* 1.79*  CALCIUM 9.4 8.7* 8.7*  GFRNONAA 46* 27* 31*     CBC Recent Labs  Lab 09/05/20 0453 09/05/20 2017 09/07/20 0230  WBC 9.7   <  > 8.0  RBC 2.45*   < > 2.31*  HGB 8.2*   < > 7.5*  HCT 24.9*   < > 23.7*  PLT 279   < > 237  MCV 101.6*   < > 102.6*  MCH 33.5   < > 32.5  MCHC 32.9   < > 31.6  RDW 15.1   < > 14.6  LYMPHSABS 3.2  --   --   MONOABS 0.3  --   --   EOSABS 0.1  --   --   BASOSABS 0.0  --   --    < > = values in this interval not displayed.     HEMOGLOBIN A1C Lab Results  Component Value Date   HGBA1C 15.4 (H) 08/20/2020   MPG 395 08/20/2020    Cardiac Panel (last 3 results) No results for input(s): CKTOTAL, CKMB, TROPONINI, RELINDX in the last 8760 hours.  BNP (last 3 results) Recent Labs    09/03/20 1035  BNP 1,371.8*     TSH Recent Labs    08/01/20 0126 09/03/20 1320  TSH 1.432 1.029     Lipid Panel  Component Value Date/Time   CHOL 267 (H) 08/21/2020 0432   TRIG 243 (H) 08/21/2020 0432   HDL 65 08/21/2020 0432   CHOLHDL 4.1 08/21/2020 0432   VLDL 49 (H) 08/21/2020 0432   LDLCALC 153 (H) 08/21/2020 0432     Hepatic Function Panel Recent Labs    08/20/20 0002 09/03/20 1035 09/06/20 0530  PROT 6.7 6.6 5.1*  ALBUMIN 3.7 3.2* 2.4*  AST 36 86* 109*  ALT 38 55* 51*  ALKPHOS 176* 143* 124  BILITOT 1.4* 0.8 0.4  BILIDIR  --  0.1 <0.1  IBILI  --  0.7 NOT CALCULATED      Cardiac Studies:  EKG 09/04/2020: Sinus rhythm Possible old anteroseptal infarct Inferolateral ST depression   Echocardiogram 09/06/2020:  1. Left ventricular ejection fraction, by estimation, is 55 to 60%. The  left ventricle has normal function. The left ventricle has no regional  wall motion abnormalities. There is mild left ventricular hypertrophy.  Left ventricular diastolic parameters  are consistent with Grade I diastolic dysfunction (impaired relaxation).   2. Right ventricular systolic function is normal. The right ventricular  size is normal. There is normal pulmonary artery systolic pressure.   3. Left atrial size was mildly dilated.   4. Possible pericardial cyst adjacent to  basal inferolateral left  ventricle.   5. No significant valvular abnormality. Normal right atrial pressure.   6. No significant change compared to previous study on 07/31/2020.   Coronary angiography 09/05/2020 [facility]):   Mid RCA lesion is 99% stenosed.   Prox LAD to Mid LAD lesion is 65% stenosed. (Pesonally reviewed. Appears 80%)   Mid Cx lesion is 40% stenosed.  Unsuccessful PTCA to mid RCA  Assessment & Recommendations:  65 y.o. African American female  with hypertension, hyperlipidemia, uncontrolled type 2 DM (A1C 15%), bipolar disorder, NSTEMI  NSTEMI: Recurrent admissions with DKA, likely due to noncompliance as well as underlying CAD and  Culprit lesion mid RCA, also has severe non-culprit lesion in mid LAD Ideally, needs RCA and LAD intervention. She has had recurrent hospitalizations with chest pain, shortness of breath and DKA. The latteer likely combination of inadequate diabetes treatment at baseline, as well as obstructive CAD. Compliance is a concern. I had a long discussion with the patient and daughter Audrea Muscat and emphazied the importance of DAPT compliance after complex PCI for NSTEMI. Both are agreeing with compliance. Patient is going to move in with her brother in [place] later this month. Will need to ensure   Subject to renal function, will consider RCA and LAD PCI on 09/08/2020  Type 2 DM: Needs aggressive management. Defer to primary team May need increase in insulin dosage.  Hyeprlipidemia: Continue lipitor to 40 mg  Hypertension: Continue baseline therapy  AKI: Improving. Cr down to 1.79 Hydrate today and then again in AM in preparation for PCI tomorrow.     Nigel Mormon, MD Pager: [phone] Office: [phone]

## 2020-09-08 ENCOUNTER — Inpatient Hospital Stay (HOSPITAL_COMMUNITY): Payer: Medicare Other

## 2020-09-08 ENCOUNTER — Encounter: Payer: Self-pay | Admitting: Cardiovascular Disease

## 2020-09-08 DIAGNOSIS — I10 Essential (primary) hypertension: Secondary | ICD-10-CM

## 2020-09-08 LAB — BASIC METABOLIC PANEL
Anion gap: 8 (ref 5–15)
Anion gap: 8 (ref 5–15)
BUN: 42 mg/dL — ABNORMAL HIGH (ref 8–23)
BUN: 44 mg/dL — ABNORMAL HIGH (ref 8–23)
CO2: 23 mmol/L (ref 22–32)
CO2: 23 mmol/L (ref 22–32)
Calcium: 8.9 mg/dL (ref 8.9–10.3)
Calcium: 8.9 mg/dL (ref 8.9–10.3)
Chloride: 106 mmol/L (ref 98–111)
Chloride: 107 mmol/L (ref 98–111)
Creatinine, Ser: 1.93 mg/dL — ABNORMAL HIGH (ref 0.44–1.00)
Creatinine, Ser: 1.94 mg/dL — ABNORMAL HIGH (ref 0.44–1.00)
GFR, Estimated: 28 mL/min — ABNORMAL LOW (ref >60)
GFR, Estimated: 28 mL/min — ABNORMAL LOW (ref >60)
Glucose, Bld: 172 mg/dL — ABNORMAL HIGH (ref 70–99)
Glucose, Bld: 173 mg/dL — ABNORMAL HIGH (ref 70–99)
Potassium: 4.5 mmol/L (ref 3.5–5.1)
Potassium: 4 mmol/L (ref 3.5–5.1)
Sodium: 137 mmol/L (ref 135–145)
Sodium: 138 mmol/L (ref 135–145)

## 2020-09-08 LAB — GLUCOSE, CAPILLARY
Glucose-Capillary: 36 mg/dL — CL (ref 70–99)
Glucose-Capillary: 306 mg/dL — ABNORMAL HIGH (ref 70–99)
Glucose-Capillary: 216 mg/dL — ABNORMAL HIGH (ref 70–99)
Glucose-Capillary: 158 mg/dL — ABNORMAL HIGH (ref 70–99)
Glucose-Capillary: 41 mg/dL — CL (ref 70–99)
Glucose-Capillary: 85 mg/dL (ref 70–99)
Glucose-Capillary: 172 mg/dL — ABNORMAL HIGH (ref 70–99)

## 2020-09-08 LAB — CBC
HCT: 25 % — ABNORMAL LOW (ref 36.0–46.0)
Hemoglobin: 7.9 g/dL — ABNORMAL LOW (ref 12.0–15.0)
MCH: 32.8 pg (ref 26.0–34.0)
MCHC: 31.6 g/dL (ref 30.0–36.0)
MCV: 103.7 fL — ABNORMAL HIGH (ref 80.0–100.0)
Platelets: 233 10*3/uL (ref 150–400)
RBC: 2.41 MIL/uL — ABNORMAL LOW (ref 3.87–5.11)
RDW: 14.6 % (ref 11.5–15.5)
WBC: 7.8 10*3/uL (ref 4.0–10.5)
nRBC: 0 % (ref 0.0–0.2)

## 2020-09-08 LAB — HEPARIN LEVEL (UNFRACTIONATED): Heparin Unfractionated: 0.5 IU/mL (ref 0.30–0.70)

## 2020-09-08 MED ORDER — SODIUM CHLORIDE 0.9 % IV SOLN: INTRAVENOUS | Status: DC

## 2020-09-08 MED ORDER — INSULIN GLARGINE-YFGN 100 UNIT/ML ~~LOC~~ SOLN
12.0000 [IU] | Freq: Every day | SUBCUTANEOUS | Status: DC
  Filled 2020-09-08 (×2): qty 0.12

## 2020-09-08 MED ORDER — FUROSEMIDE 10 MG/ML IJ SOLN
60.0000 mg | Freq: Once | INTRAMUSCULAR | Status: AC
  Administered 2020-09-08: 60 mg via INTRAVENOUS
  Filled 2020-09-08: qty 6

## 2020-09-08 NOTE — Progress Notes (Signed)
ANTICOAGULATION CONSULT NOTE - Follow Up Consult  Pharmacy Consult for heparin Indication: chest pain/ACS  No Known Allergies  Patient Measurements: Height: '5\' 2"'$  (157.5 cm) Weight: 43 kg (94 lb 12.8 oz) IBW/kg (Calculated) : 50.1 Heparin Dosing Weight: 43 kg  Vital Signs: Temp: 97.6 F (36.4 C) (08/15 0513) Temp Source: Oral (08/15 0513) BP: 143/79 (08/15 0513) Pulse Rate: 67 (08/15 0513)  Labs: Recent Labs    09/06/20 0530 09/06/20 0530 09/06/20 0531 09/06/20 1738 09/07/20 0230 09/07/20 1631 09/08/20 0222  HGB  --    < > 8.5*  --  7.5*  --  7.9*  HCT  --   --  26.7*  --  23.7*  --  25.0*  PLT  --   --  261  --  237  --  233  HEPARINUNFRC 0.28*  --   --  0.39 0.38  --  0.50  CREATININE 1.79*  --   --   --   --  1.88* 1.93*   < > = values in this interval not displayed.     Estimated Creatinine Clearance: 19.7 mL/min (A) (by C-G formula based on SCr of 1.93 mg/dL (H)).   Assessment: Pt was transferred from [facility] on 09/[facility]/20 for atherectomy on Monday, 09/08/20 after an unsuccessful attempt at balloon angioplasty of RCA. Heparin was resumed post cath  Heparin level 0.5 remains therapeutic on gtt at 800 units/h. Hgb low stable 7-8 plt wnl. No bleeding or issues with infusion per RN.   Goal of Therapy:  Heparin level 0.3-0.7 units/ml Monitor platelets by anticoagulation protocol: Yes   Plan:  -Continue heparin at 800 units/hr -Daily heparin level and CBC -F/u PCI plans with renal function     Bonnita Nasuti Pharm.D. CPP, BCPS Clinical Pharmacist [phone] 09/08/2020 7:59 AM

## 2020-09-08 NOTE — Progress Notes (Signed)
PROGRESS NOTE    Erica Mercer  [id number] DOB: 1955-05-22 DOA: 09/05/2020 PCP: Erica Jaksch, NP    Brief Narrative:  Mrs. Erica Mercer was admitted to the hospital working diagnosis of non-ST elevation myocardial infarction complicated by acute diastolic heart failure decompensation and acute kidney injury.   65 year old female past medical history for type II [place] mellitus, hypertension, dyslipidemia, chronic anemia, bipolar disorder who presented with chest pain.  She woke up with severe chest pain 8 out of 10, pressure-like associated with dyspnea and lightheadedness.  In the ED she was diagnosed with non-ST relation myocardial infarction and underwent emergent cardiac catheterization, 90% stenosis of RCA with severe calcification.  Unable to intervene, transferred from [facility] to [facility] for further invasive procedures.  On her initial physical examination blood pressures was 160/100, heart rate 66, respirate 16, temperature 98.1, oxygen saturation 100%.  Her lungs had rales bilaterally, heart S1-S2, present, rhythmic, soft abdomen, no lower extremity edema.   Sodium 140, potassium 4.2, chloride 106, bicarb 26, glucose 134, BUN 58, creatinine 2.0, white count 9.7, hemoglobin 8.2, hematocrit 34.9, platelets 279. High sensitive troponin 154-237. SARS COVID-19 negative.   Urinalysis specific gravity 1.011. >  500 glucose. Toxicology screen positive for cannabinoid.   Chest radiograph with increased lung markings bilaterally, fluid in the right fissure.   EKG 102 bpm, normal axis, normal intervals, sinus rhythm, ST segment depression V5-V6 no significant ST wave changes, positive LVH.   Patient has been placed on heparin drip with plans for cath on 08/15 for possible revascularization      Assessment & Plan:   Principal Problem:   Non-ST elevation (NSTEMI) myocardial infarction [facility]) Active Problems:   Severe protein-calorie malnutrition ([place])   Essential hypertension   HLD  (hyperlipidemia)   COPD (chronic obstructive pulmonary disease) (HCC)   Bipolar 1 disorder (HCC)   AKI (acute kidney injury) ([place])   Stage 3a chronic kidney disease (HCC)   Type II diabetes mellitus with renal manifestations ([place])   NSTEMI. Continue with no chest pain, but mild dyspnea.   Echocardiogram with LV systolic function 55 to [id number] with no wall motion abnormalities. RV systolic function is preserved.   Continue with medical therapy with IV heparin, aspirin/ ticagrelor and carvedilol.  Continue with isosorbide, amlodipine and hydralazine.  atorvastatin.    Continue pain control with oxycodone and hydromorphone.  Pending coronary angiography per cardiology   2. T2DM with uncontrolled hypoglycemia/ hyperglycemia.  Fasting glucose is 172 mg/dl this am Continue basal insulin and insulin sliding scale for glucose cover and monitoring.  Continue with pre-meal insulin for pandrial hyperglycemia.   Continue with gabapentin for neuropathic pain, diabetic neuropathy, reduced dose for low GFR.     3. AKI on CKD stage 3a  Renal function with serum cr at 1,93 with K at 4,5 and serum bicarbonate at 23.  Positive fluid balance 3,230. Patient with pedal edema, and rales. Positive dyspnea.  Consider trial of diuresis with furosemide and follow up renal function in am.    4. Acute diastolic heart failure, uncontrolled HTN/ hypertensive emergency.  Blood pressure systolic [id number] to 0000000 mmHg,  Continue with isosorbide and hydralazine for blood pressure control.   Consider trial of furosemide    5. Depression, severe calorie protein malnutrition.  Continue with nutritional supplements.  Continue with mirtazapine and brexpiprazole    6. Chronic anemia. Stable hgb and hct   Patient continue to be at high risk for worsening acs and renal failure   Status is: Inpatient  Remains inpatient appropriate because:Inpatient level of care appropriate due to severity of illness  Dispo: The  patient is from: Home              Anticipated d/c is to: Home              Patient currently is not medically stable to d/c.   Difficult to place patient No    DVT prophylaxis:  Heparin   Code Status:    full  Family Communication:   I spoke with patient's daugher at the bedside, we talked in detail about patient's condition, plan of care and prognosis and all questions were addressed.      Nutrition Status: Nutrition Problem: Increased nutrient needs Etiology: chronic illness (heart failure) Signs/Symptoms: estimated needs Interventions: Nepro shake     Skin Documentation:     Consultants:  Cardiology     Subjective: Patient has dyspnea and ankle edema, no nausea or vomiting, no chest pain,   Objective: Vitals:   09/07/20 2035 09/08/20 0318 09/08/20 0513 09/08/20 1253  BP: 135/80 (!) 167/83 (!) 143/79 (!) 153/85  Pulse: 69 71 67   Resp: '18 18 16   '$ Temp: (!) 97.5 F (36.4 C) 98.6 F (37 C) 97.6 F (36.4 C)   TempSrc: Oral Axillary Oral   SpO2: 94% 96% 98%   Weight:      Height:        Intake/Output Summary (Last 24 hours) at 09/08/2020 1323 Last data filed at 09/08/2020 0351 Gross per 24 hour  Intake 1228.91 ml  Output --  Net 1228.91 ml   Filed Weights   09/05/20 1947  Weight: 43 kg    Examination:   General: deconditioned  Neurology: Awake and alert, non focal  E ENT: no pallor, no icterus, oral mucosa moist Cardiovascular: No JVD. S1-S2 present, rhythmic, no gallops, rubs, or murmurs.Pitting ankle and pedal edema, Pulmonary:  positive breath sounds bilaterally, with no wheezing,or rhonchi mild rales at bases Gastrointestinal. Abdomen soft and non tender Skin. No rashes Musculoskeletal: no joint deformities     Data Reviewed: I have personally reviewed following labs and imaging studies  CBC: Recent Labs  Lab 09/04/20 0924 09/05/20 0453 09/06/20 0531 09/07/20 0230 09/08/20 0222  WBC 17.4* 9.7 6.8 8.0 7.8  NEUTROABS  --  6.0  --    --   --   HGB 9.1* 8.2* 8.5* 7.5* 7.9*  HCT 27.0* 24.9* 26.7* 23.7* 25.0*  MCV 98.9 101.6* 104.7* 102.6* 103.7*  PLT 316 279 261 237 0000000   Basic Metabolic Panel: Recent Labs  Lab 09/04/20 0924 09/05/20 0453 09/06/20 0530 09/07/20 1631 09/08/20 0222  NA 135 140 135 139 137  K 4.4 4.2 4.4 4.5 4.5  CL 102 107 104 108 106  CO2 '27 26 24 23 23  '$ GLUCOSE 294* 134* 483* 143* 172*  BUN 46* 58* 47* 42* 42*  CREATININE 1.28* 2.01* 1.79* 1.88* 1.93*  CALCIUM 9.4 8.7* 8.7* 8.8* 8.9   GFR: Estimated Creatinine Clearance: 19.7 mL/min (A) (by C-G formula based on SCr of 1.93 mg/dL (H)). Liver Function Tests: Recent Labs  Lab 09/03/20 1035 09/06/20 0530  AST 86* 109*  ALT 55* 51*  ALKPHOS 143* 124  BILITOT 0.8 0.4  PROT 6.6 5.1*  ALBUMIN 3.2* 2.4*   No results for input(s): LIPASE, AMYLASE in the last 168 hours. No results for input(s): AMMONIA in the last 168 hours. Coagulation Profile: Recent Labs  Lab 09/03/20 1320 09/04/20 0924  INR 1.0 1.0  Cardiac Enzymes: No results for input(s): CKTOTAL, CKMB, CKMBINDEX, TROPONINI in the last 168 hours. BNP (last 3 results) No results for input(s): PROBNP in the last 8760 hours. HbA1C: No results for input(s): HGBA1C in the last 72 hours. CBG: Recent Labs  Lab 09/07/20 1145 09/07/20 1539 09/07/20 2212 09/08/20 0741 09/08/20 1154  GLUCAP 213* 130* 111* 306* 216*   Lipid Profile: No results for input(s): CHOL, HDL, LDLCALC, TRIG, CHOLHDL, LDLDIRECT in the last 72 hours. Thyroid Function Tests: No results for input(s): TSH, T4TOTAL, FREET4, T3FREE, THYROIDAB in the last 72 hours. Anemia Panel: Recent Labs    09/05/20 2017  RETICCTPCT 4.0*      Radiology Studies: I have reviewed all of the imaging during this hospital visit personally     Scheduled Meds:  amLODipine  10 mg Oral Daily   aspirin EC  81 mg Oral Daily   atorvastatin  40 mg Oral Daily   brexpiprazole  1 mg Oral Daily   carvedilol  25 mg Oral BID WC    famotidine  20 mg Oral QHS   feeding supplement (NEPRO CARB STEADY)  237 mL Oral BID BM   gabapentin  300 mg Oral BID   hydrALAZINE  50 mg Oral Q8H   insulin aspart  0-5 Units Subcutaneous QHS   insulin aspart  0-9 Units Subcutaneous TID WC   insulin aspart  3 Units Subcutaneous TID AC   insulin glargine-yfgn  18 Units Subcutaneous Daily   isosorbide mononitrate  30 mg Oral Daily   mirtazapine  15 mg Oral QHS   nicotine  21 mg Transdermal Daily   pantoprazole  40 mg Oral BID   sodium chloride flush  3 mL Intravenous Q12H   ticagrelor  90 mg Oral BID   Continuous Infusions:  sodium chloride 50 mL/hr at 09/07/20 2158   sodium chloride     sodium chloride 1 mL/kg/hr (09/08/20 0351)   heparin 800 Units/hr (09/08/20 1153)     LOS: 3 days        Townes Fuhs Gerome Apley, MD

## 2020-09-08 NOTE — TOC Benefit Eligibility Note (Signed)
Transition of Care [facility]) Benefit Eligibility Note    Patient Details  Name: Erica Mercer MRN: [id number] Date of Birth: Mar 02, 1955   Medication/Dose: BRILINTA  90 MG BID  Covered?: Yes  Tier: 3 Drug  Prescription Coverage Preferred Pharmacy: [facility] [place] with Person/Company/Phone Number:: [phone]  @  [employer] # [phone]  Co-Pay: Johnsie Kindred  Prior Approval: No  Deductible:  (NO DEDUCTIBLE / PT'S HAS LOWE INCOME SUBSIDY LEVEL-2)  Additional Notes: SECONDARY INS: MEDICAID [place] ACCESS : EFF-DATE 01-26-2020 , CO-PAY- $4.00 FOR EACH PRESCRIPTION    Memory Argue Phone Number: 09/08/2020, 5:01 PM

## 2020-09-08 NOTE — Progress Notes (Addendum)
Subjective:  Feels well No chest pain currently  Cr up to 1.94  Objective:  Vital Signs in the last 24 hours: Temp:  [97.5 F (36.4 C)-98.7 F (37.1 C)] 97.6 F (36.4 C) (08/15 0513) Pulse Rate:  [67-79] 67 (08/15 0513) Resp:  [16-18] 16 (08/15 0513) BP: (126-178)/(70-92) 143/79 (08/15 0513) SpO2:  [94 %-98 %] 98 % (08/15 0513)  Intake/Output from previous day: 08/14 0701 - 08/15 0700 In: 1228.9 [P.O.:360; I.V.:868.9] Out: -   Physical Exam Vitals and nursing note reviewed.  Constitutional:      General: She is not in acute distress.    Appearance: She is well-developed and underweight.  HENT:     Head: Normocephalic and atraumatic.  Eyes:     Conjunctiva/sclera: Conjunctivae normal.     Pupils: Pupils are equal, round, and reactive to light.  Neck:     Vascular: No JVD.  Cardiovascular:     Rate and Rhythm: Normal rate and regular rhythm.     Pulses: Intact distal pulses.          Dorsalis pedis pulses are 1+ on the right side and 0 on the left side.       Posterior tibial pulses are 1+ on the right side and 1+ on the left side.     Heart sounds: No murmur heard. Pulmonary:     Effort: Pulmonary effort is normal.     Breath sounds: Normal breath sounds. No wheezing or rales.  Abdominal:     General: Bowel sounds are normal.     Palpations: Abdomen is soft.     Tenderness: There is no rebound.  Musculoskeletal:        General: No tenderness. Normal range of motion.     Right lower leg: No edema.     Left lower leg: No edema.  Lymphadenopathy:     Cervical: No cervical adenopathy.  Skin:    General: Skin is warm and dry.  Neurological:     Mental Status: She is alert and oriented to person, place, and time.     Cranial Nerves: No cranial nerve deficit.  Psychiatric:     Comments: Tearful   No change in physical exam compared to 09/06/2020  Lab Results: BMP Recent Labs    09/06/20 0530 09/07/20 1631 09/08/20 0222  NA 135 139 137  K 4.4 4.5 4.5  CL  104 108 106  CO2 '24 23 23  '$ GLUCOSE 483* 143* 172*  BUN 47* 42* 42*  CREATININE 1.79* 1.88* 1.93*  CALCIUM 8.7* 8.8* 8.9  GFRNONAA 31* 29* 28*     CBC Recent Labs  Lab 09/05/20 0453 09/05/20 2017 09/08/20 0222  WBC 9.7   < > 7.8  RBC 2.45*   < > 2.41*  HGB 8.2*   < > 7.9*  HCT 24.9*   < > 25.0*  PLT 279   < > 233  MCV 101.6*   < > 103.7*  MCH 33.5   < > 32.8  MCHC 32.9   < > 31.6  RDW 15.1   < > 14.6  LYMPHSABS 3.2  --   --   MONOABS 0.3  --   --   EOSABS 0.1  --   --   BASOSABS 0.0  --   --    < > = values in this interval not displayed.     HEMOGLOBIN A1C Lab Results  Component Value Date   HGBA1C 15.4 (H) 08/20/2020   MPG 395 08/20/2020  Cardiac Panel (last 3 results) No results for input(s): CKTOTAL, CKMB, TROPONINI, RELINDX in the last 8760 hours.  BNP (last 3 results) Recent Labs    09/03/20 1035  BNP 1,371.8*     TSH Recent Labs    08/01/20 0126 09/03/20 1320  TSH 1.432 1.029     Lipid Panel     Component Value Date/Time   CHOL 267 (H) 08/21/2020 0432   TRIG 243 (H) 08/21/2020 0432   HDL 65 08/21/2020 0432   CHOLHDL 4.1 08/21/2020 0432   VLDL 49 (H) 08/21/2020 0432   LDLCALC 153 (H) 08/21/2020 0432     Hepatic Function Panel Recent Labs    08/20/20 0002 09/03/20 1035 09/06/20 0530  PROT 6.7 6.6 5.1*  ALBUMIN 3.7 3.2* 2.4*  AST 36 86* 109*  ALT 38 55* 51*  ALKPHOS 176* 143* 124  BILITOT 1.4* 0.8 0.4  BILIDIR  --  0.1 <0.1  IBILI  --  0.7 NOT CALCULATED      Cardiac Studies:  EKG 09/04/2020: Sinus rhythm Possible old anteroseptal infarct Inferolateral ST depression   Echocardiogram 09/06/2020:  1. Left ventricular ejection fraction, by estimation, is 55 to 60%. The  left ventricle has normal function. The left ventricle has no regional  wall motion abnormalities. There is mild left ventricular hypertrophy.  Left ventricular diastolic parameters  are consistent with Grade I diastolic dysfunction (impaired  relaxation).   2. Right ventricular systolic function is normal. The right ventricular  size is normal. There is normal pulmonary artery systolic pressure.   3. Left atrial size was mildly dilated.   4. Possible pericardial cyst adjacent to basal inferolateral left  ventricle.   5. No significant valvular abnormality. Normal right atrial pressure.   6. No significant change compared to previous study on 07/31/2020.   Coronary angiography 09/05/2020 [facility]):   Mid RCA lesion is 99% stenosed.   Prox LAD to Mid LAD lesion is 65% stenosed. (Pesonally reviewed. Appears 80%)   Mid Cx lesion is 40% stenosed.  Unsuccessful PTCA to mid RCA  Assessment & Recommendations:  65 y.o. African American female  with hypertension, hyperlipidemia, uncontrolled type 2 DM (A1C 15%), bipolar disorder, NSTEMI  NSTEMI: Recurrent admissions with DKA, likely due to noncompliance as well as underlying CAD and  Culprit lesion mid RCA, also has severe non-culprit lesion in mid LAD Ideally, needs RCA and LAD intervention. She has had recurrent hospitalizations with chest pain, shortness of breath and DKA. The latteer likely combination of inadequate diabetes treatment at baseline, as well as obstructive CAD. Compliance is a concern. I had a long discussion with the patient and daughter Audrea Muscat and emphazied the importance of DAPT compliance after complex PCI for NSTEMI. Both are agreeing with compliance. Patient is going to move in with her brother in [place] later this month.   Initial plan was to perform RCA plus minus LAD intervention today.  However, her creatinine has increased from 1.79 yesterday to 1.93 today, and continues to increase at 1.94 and spite of additional fluids.  She does not appear to be in in florid heart failure to me.  Nonetheless, given her complaint of shortness of breath, will obtain chest x-ray and give a dose of Lasix as we continue hydration.  Goal will be to maintain urine output to reduce risk  of contrast-induced nephropathy.   It appears that her baseline creatinine is around 1.0-1.2, and has only increased to up to 2.0 during acute hospitalizations with non-STEMI and DKA in 07/2020,  and 08/2020.  Therefore, I do think we should await further improvement in creatinine before coronary stenting to reduce risk of contrast-induced nephropathy.  In spite of all efforts, if creatinine continues to be elevated, will need to then consider medical therapy to avoid the risk of renal injury.   I will repeat BMP tomorrow morning. If favorable, my partner Dr. Einar Gip will consider PCI later in the day on 09/09/2020.   Type 2 DM: Glycemic control is improving. Defer to primary team   Hyeprlipidemia: Continue lipitor 40 mg   Hypertension: Continue baseline therapy   AKI: As above. Likely combination of pre-renal injury and diabetic nephropathy.      I discussed the above findings with hospitalist Dr. Cathlean Sauer, patient, and daughter Pearson Forster.    Nigel Mormon, MD Pager: [phone] Office: [phone]

## 2020-09-09 ENCOUNTER — Other Ambulatory Visit: Payer: Self-pay

## 2020-09-09 ENCOUNTER — Other Ambulatory Visit: Payer: Self-pay | Admitting: Cardiology

## 2020-09-09 ENCOUNTER — Telehealth: Payer: Self-pay | Admitting: Cardiology

## 2020-09-09 ENCOUNTER — Inpatient Hospital Stay (HOSPITAL_COMMUNITY)
Admission: AD | Disposition: A | Discharge status: Home Health | Payer: Self-pay | Source: Other Acute Inpatient Hospital | Attending: Internal Medicine

## 2020-09-09 DIAGNOSIS — N1832 Chronic kidney disease, stage 3b: Secondary | ICD-10-CM

## 2020-09-09 HISTORY — PX: CORONARY STENT INTERVENTION: CATH118234

## 2020-09-09 HISTORY — PX: INTRAVASCULAR LITHOTRIPSY: CATH118324

## 2020-09-09 LAB — CBC
HCT: 22.5 % — ABNORMAL LOW (ref 36.0–46.0)
Hemoglobin: 7.1 g/dL — ABNORMAL LOW (ref 12.0–15.0)
MCH: 33 pg (ref 26.0–34.0)
MCHC: 31.6 g/dL (ref 30.0–36.0)
MCV: 104.7 fL — ABNORMAL HIGH (ref 80.0–100.0)
Platelets: 227 10*3/uL (ref 150–400)
RBC: 2.15 MIL/uL — ABNORMAL LOW (ref 3.87–5.11)
RDW: 14.8 % (ref 11.5–15.5)
WBC: 6.6 10*3/uL (ref 4.0–10.5)
nRBC: 0 % (ref 0.0–0.2)

## 2020-09-09 LAB — GLUCOSE, CAPILLARY
Glucose-Capillary: 374 mg/dL — ABNORMAL HIGH (ref 70–99)
Glucose-Capillary: 323 mg/dL — ABNORMAL HIGH (ref 70–99)
Glucose-Capillary: 105 mg/dL — ABNORMAL HIGH (ref 70–99)
Glucose-Capillary: 185 mg/dL — ABNORMAL HIGH (ref 70–99)
Glucose-Capillary: 173 mg/dL — ABNORMAL HIGH (ref 70–99)

## 2020-09-09 LAB — BASIC METABOLIC PANEL
Anion gap: 8 (ref 5–15)
BUN: 52 mg/dL — ABNORMAL HIGH (ref 8–23)
CO2: 21 mmol/L — ABNORMAL LOW (ref 22–32)
Calcium: 8.5 mg/dL — ABNORMAL LOW (ref 8.9–10.3)
Chloride: 104 mmol/L (ref 98–111)
Creatinine, Ser: 2.14 mg/dL — ABNORMAL HIGH (ref 0.44–1.00)
GFR, Estimated: 25 mL/min — ABNORMAL LOW (ref >60)
Glucose, Bld: 386 mg/dL — ABNORMAL HIGH (ref 70–99)
Potassium: 4.6 mmol/L (ref 3.5–5.1)
Sodium: 133 mmol/L — ABNORMAL LOW (ref 135–145)

## 2020-09-09 LAB — POCT ACTIVATED CLOTTING TIME
Activated Clotting Time: 219 seconds
Activated Clotting Time: 254 seconds
Activated Clotting Time: 277 seconds
Activated Clotting Time: 271 seconds

## 2020-09-09 LAB — HEPARIN LEVEL (UNFRACTIONATED): Heparin Unfractionated: 0.38 IU/mL (ref 0.30–0.70)

## 2020-09-09 SURGERY — CORONARY STENT INTERVENTION
Anesthesia: LOCAL

## 2020-09-09 MED ORDER — HEPARIN (PORCINE) IN NACL 1000-0.9 UT/500ML-% IV SOLN
INTRAVENOUS | Status: DC | PRN
  Administered 2020-09-09 (×2): 500 mL

## 2020-09-09 MED ORDER — IOHEXOL 350 MG/ML SOLN
INTRAVENOUS | Status: DC | PRN
  Administered 2020-09-09: 30 mL via INTRA_ARTERIAL

## 2020-09-09 MED ORDER — CEFAZOLIN SODIUM-DEXTROSE 2-3 GM-%(50ML) IV SOLR
INTRAVENOUS | Status: AC | PRN
  Administered 2020-09-09: 2 g via INTRAVENOUS

## 2020-09-09 MED ORDER — HYDRALAZINE HCL 20 MG/ML IJ SOLN: 5.0000 mg | INTRAMUSCULAR | Status: AC | PRN

## 2020-09-09 MED ORDER — LIDOCAINE HCL (PF) 1 % IJ SOLN
INTRAMUSCULAR | Status: DC | PRN
  Administered 2020-09-09: 15 mL via INTRADERMAL

## 2020-09-09 MED ORDER — LIDOCAINE-EPINEPHRINE 1 %-1:100000 IJ SOLN
INTRAMUSCULAR | Status: AC
  Filled 2020-09-09: qty 1

## 2020-09-09 MED ORDER — CHLORHEXIDINE GLUCONATE CLOTH 2 % EX PADS
6.0000 | MEDICATED_PAD | Freq: Every day | CUTANEOUS | Status: DC
  Administered 2020-09-09 – 2020-09-12 (×4): 6 via TOPICAL

## 2020-09-09 MED ORDER — SODIUM CHLORIDE 0.9 % IV SOLN: 250.0000 mL | INTRAVENOUS | Status: DC | PRN

## 2020-09-09 MED ORDER — NITROGLYCERIN 1 MG/10 ML FOR IR/CATH LAB
INTRA_ARTERIAL | Status: DC | PRN
  Administered 2020-09-09: 100 ug via INTRACORONARY

## 2020-09-09 MED ORDER — HEPARIN SODIUM (PORCINE) 1000 UNIT/ML IJ SOLN
INTRAMUSCULAR | Status: DC | PRN
  Administered 2020-09-09: 3000 [IU] via INTRAVENOUS
  Administered 2020-09-09: 6000 [IU] via INTRAVENOUS
  Administered 2020-09-09: 3000 [IU] via INTRAVENOUS
  Administered 2020-09-09: 2000 [IU] via INTRAVENOUS

## 2020-09-09 MED ORDER — SODIUM CHLORIDE 0.9% FLUSH: 3.0000 mL | INTRAVENOUS | Status: DC | PRN

## 2020-09-09 MED ORDER — LIDOCAINE-EPINEPHRINE 1 %-1:100000 IJ SOLN
INTRAMUSCULAR | Status: DC | PRN
Start: 2020-09-09 — End: 2020-09-09
  Administered 2020-09-09: 5 mL

## 2020-09-09 MED ORDER — HEPARIN (PORCINE) 25000 UT/250ML-% IV SOLN: 800.0000 [IU]/h | INTRAVENOUS | Status: DC

## 2020-09-09 MED ORDER — HEPARIN (PORCINE) 25000 UT/250ML-% IV SOLN
800.0000 [IU]/h | INTRAVENOUS | Status: DC
  Administered 2020-09-10: 800 [IU]/h via INTRAVENOUS

## 2020-09-09 MED ORDER — SODIUM CHLORIDE 0.9 % WEIGHT BASED INFUSION: 1.0000 mL/kg/h | INTRAVENOUS | Status: AC

## 2020-09-09 MED ORDER — ONDANSETRON HCL 4 MG/2ML IJ SOLN
INTRAMUSCULAR | Status: AC
  Filled 2020-09-09: qty 2

## 2020-09-09 MED ORDER — HEPARIN (PORCINE) IN NACL 1000-0.9 UT/500ML-% IV SOLN
INTRAVENOUS | Status: AC
  Filled 2020-09-09: qty 500

## 2020-09-09 MED ORDER — INSULIN ASPART 100 UNIT/ML IJ SOLN
3.0000 [IU] | Freq: Three times a day (TID) | INTRAMUSCULAR | Status: DC
  Administered 2020-09-10 (×3): 3 [IU] via SUBCUTANEOUS

## 2020-09-09 MED ORDER — FENTANYL CITRATE (PF) 100 MCG/2ML IJ SOLN
INTRAMUSCULAR | Status: DC | PRN
  Administered 2020-09-09 (×3): 25 ug via INTRAVENOUS

## 2020-09-09 MED ORDER — LIDOCAINE HCL (PF) 1 % IJ SOLN
INTRAMUSCULAR | Status: AC
  Filled 2020-09-09: qty 30

## 2020-09-09 MED ORDER — SODIUM CHLORIDE 0.9% FLUSH
3.0000 mL | Freq: Two times a day (BID) | INTRAVENOUS | Status: DC
  Administered 2020-09-10 – 2020-09-18 (×17): 3 mL via INTRAVENOUS

## 2020-09-09 MED ORDER — HEPARIN SODIUM (PORCINE) 1000 UNIT/ML IJ SOLN
INTRAMUSCULAR | Status: AC
  Filled 2020-09-09: qty 1

## 2020-09-09 MED ORDER — ONDANSETRON HCL 4 MG/2ML IJ SOLN
INTRAMUSCULAR | Status: DC | PRN
Start: 2020-09-09 — End: 2020-09-09
  Administered 2020-09-09: 4 mg via INTRAVENOUS

## 2020-09-09 MED ORDER — MIDAZOLAM HCL 2 MG/2ML IJ SOLN
INTRAMUSCULAR | Status: AC
  Filled 2020-09-09: qty 2

## 2020-09-09 MED ORDER — MIDAZOLAM HCL 2 MG/2ML IJ SOLN
INTRAMUSCULAR | Status: DC | PRN
  Administered 2020-09-09: 1 mg via INTRAVENOUS

## 2020-09-09 MED ORDER — NITROGLYCERIN 1 MG/10 ML FOR IR/CATH LAB
INTRA_ARTERIAL | Status: AC
  Filled 2020-09-09: qty 10

## 2020-09-09 MED ORDER — FENTANYL CITRATE (PF) 100 MCG/2ML IJ SOLN
INTRAMUSCULAR | Status: AC
  Filled 2020-09-09: qty 2

## 2020-09-09 MED ORDER — CEFAZOLIN SODIUM-DEXTROSE 2-4 GM/100ML-% IV SOLN
INTRAVENOUS | Status: AC
  Filled 2020-09-09: qty 100

## 2020-09-09 MED ORDER — FUROSEMIDE 10 MG/ML IJ SOLN
20.0000 mg | Freq: Once | INTRAMUSCULAR | Status: AC
  Administered 2020-09-09: 20 mg via INTRAVENOUS
  Filled 2020-09-09: qty 2

## 2020-09-09 SURGICAL SUPPLY — 25 items
BALLN EUPHORA RX 2.0X15 (BALLOONS) ×3
BALLN EUPHORA RX 3.0X15 (BALLOONS) ×3
BALLN SAPPHIRE 1.5X15 (BALLOONS) ×3
BALLN SAPPHIRE 2.0X15 (BALLOONS) ×3
BALLN SAPPHIRE 3.0X15 (BALLOONS)
BALLOON EUPHORA RX 2.0X15 (BALLOONS) ×2 IMPLANT
BALLOON EUPHORA RX 3.0X15 (BALLOONS) ×2 IMPLANT
BALLOON SAPPHIRE 1.5X15 (BALLOONS) ×2 IMPLANT
BALLOON SAPPHIRE 2.0X15 (BALLOONS) ×2 IMPLANT
BALLOON SAPPHIRE 3.0X15 (BALLOONS) IMPLANT
CATH SHOCKWAVE 3.5X12 (CATHETERS) ×2 IMPLANT
CATH VISTA GUIDE 6FR JR4 (CATHETERS) ×3 IMPLANT
CATHETER SHOCKWAVE 3.5X12 (CATHETERS) ×3
CLOSURE PERCLOSE PROSTYLE (VASCULAR PRODUCTS) ×3 IMPLANT
GUIDELINER 6F (CATHETERS) ×3 IMPLANT
KIT HEART LEFT (KITS) ×3 IMPLANT
PACK CARDIAC CATHETERIZATION (CUSTOM PROCEDURE TRAY) ×3 IMPLANT
SHEATH PINNACLE 6F 10CM (SHEATH) ×3 IMPLANT
SHEATH PROBE COVER 6X72 (BAG) ×3 IMPLANT
STENT ONYX FRONTIER 3.5X15 (Permanent Stent) ×3 IMPLANT
TRANSDUCER W/STOPCOCK (MISCELLANEOUS) ×3 IMPLANT
TUBING CIL FLEX 10 FLL-RA (TUBING) ×3 IMPLANT
WIRE COUGAR XT STRL 190CM (WIRE) ×3 IMPLANT
WIRE EMERALD 3MM-J .035X150CM (WIRE) ×3 IMPLANT
WIRE MAILMAN 182CM (WIRE) ×3 IMPLANT

## 2020-09-09 NOTE — H&P (View-Only) (Signed)
Subjective:  Feels well No chest pain currently  Cr up to 2.1. Lot of urinary incontinence. Urine output not accurate  Objective:  Vital Signs in the last 24 hours: Temp:  [97.5 F (36.4 C)-98.9 F (37.2 C)] 98.9 F (37.2 C) (08/16 0518) Pulse Rate:  [68] 68 (08/15 2058) Resp:  [18] 18 (08/16 0518) BP: (137-153)/(74-85) 137/82 (08/15 2058) SpO2:  [98 %] 98 % (08/15 2058)  Intake/Output from previous day: 08/15 0701 - 08/16 0700 In: 1667.4 [P.O.:240; I.V.:1427.4] Out: 400 [Urine:400]  Physical Exam Vitals and nursing note reviewed.  Constitutional:      General: She is not in acute distress.    Appearance: She is well-developed and underweight.  HENT:     Head: Normocephalic and atraumatic.  Eyes:     Conjunctiva/sclera: Conjunctivae normal.     Pupils: Pupils are equal, round, and reactive to light.  Neck:     Vascular: No JVD.  Cardiovascular:     Rate and Rhythm: Normal rate and regular rhythm.     Pulses: Intact distal pulses.          Dorsalis pedis pulses are 1+ on the right side and 0 on the left side.       Posterior tibial pulses are 1+ on the right side and 1+ on the left side.     Heart sounds: No murmur heard. Pulmonary:     Effort: Pulmonary effort is normal.     Breath sounds: Normal breath sounds. No wheezing or rales.  Abdominal:     General: Bowel sounds are normal.     Palpations: Abdomen is soft.     Tenderness: There is no rebound.  Musculoskeletal:        General: No tenderness. Normal range of motion.     Right lower leg: Edema (Trace) present.     Left lower leg: Edema (Trace) present.  Lymphadenopathy:     Cervical: No cervical adenopathy.  Skin:    General: Skin is warm and dry.  Neurological:     Mental Status: She is alert and oriented to person, place, and time.     Cranial Nerves: No cranial nerve deficit.     Comments: In good spirits today. Waiting to find out if we will do the procedure today.   Psychiatric:     Comments:  Tearful   No change in physical exam compared to 09/06/2020  Lab Results: BMP Recent Labs    09/08/20 0222 09/08/20 1232 09/09/20 0247  NA 137 138 133*  K 4.5 4.0 4.6  CL 106 107 104  CO2 23 23 21*  GLUCOSE 172* 173* 386*  BUN 42* 44* 52*  CREATININE 1.93* 1.94* 2.14*  CALCIUM 8.9 8.9 8.5*  GFRNONAA 28* 28* 25*     CBC Recent Labs  Lab 09/05/20 0453 09/05/20 2017 09/09/20 0247  WBC 9.7   < > 6.6  RBC 2.45*   < > 2.15*  HGB 8.2*   < > 7.1*  HCT 24.9*   < > 22.5*  PLT 279   < > 227  MCV 101.6*   < > 104.7*  MCH 33.5   < > 33.0  MCHC 32.9   < > 31.6  RDW 15.1   < > 14.8  LYMPHSABS 3.2  --   --   MONOABS 0.3  --   --   EOSABS 0.1  --   --   BASOSABS 0.0  --   --    < > =  values in this interval not displayed.     HEMOGLOBIN A1C Lab Results  Component Value Date   HGBA1C 15.4 (H) 08/20/2020   MPG 395 08/20/2020    Cardiac Panel (last 3 results) No results for input(s): CKTOTAL, CKMB, TROPONINI, RELINDX in the last 8760 hours.  BNP (last 3 results) Recent Labs    09/03/20 1035  BNP 1,371.8*     TSH Recent Labs    08/01/20 0126 09/03/20 1320  TSH 1.432 1.029     Lipid Panel     Component Value Date/Time   CHOL 267 (H) 08/21/2020 0432   TRIG 243 (H) 08/21/2020 0432   HDL 65 08/21/2020 0432   CHOLHDL 4.1 08/21/2020 0432   VLDL 49 (H) 08/21/2020 0432   LDLCALC 153 (H) 08/21/2020 0432     Hepatic Function Panel Recent Labs    08/20/20 0002 09/03/20 1035 09/06/20 0530  PROT 6.7 6.6 5.1*  ALBUMIN 3.7 3.2* 2.4*  AST 36 86* 109*  ALT 38 55* 51*  ALKPHOS 176* 143* 124  BILITOT 1.4* 0.8 0.4  BILIDIR  --  0.1 <0.1  IBILI  --  0.7 NOT CALCULATED      Cardiac Studies:  EKG 09/04/2020: Sinus rhythm Possible old anteroseptal infarct Inferolateral ST depression   Echocardiogram 09/06/2020:  1. Left ventricular ejection fraction, by estimation, is 55 to 60%. The  left ventricle has normal function. The left ventricle has no regional   wall motion abnormalities. There is mild left ventricular hypertrophy.  Left ventricular diastolic parameters  are consistent with Grade I diastolic dysfunction (impaired relaxation).   2. Right ventricular systolic function is normal. The right ventricular  size is normal. There is normal pulmonary artery systolic pressure.   3. Left atrial size was mildly dilated.   4. Possible pericardial cyst adjacent to basal inferolateral left  ventricle.   5. No significant valvular abnormality. Normal right atrial pressure.   6. No significant change compared to previous study on 07/31/2020.   Coronary angiography 09/05/2020 [facility]):   Mid RCA lesion is 99% stenosed.   Prox LAD to Mid LAD lesion is 65% stenosed. (Pesonally reviewed. Appears 80%)   Mid Cx lesion is 40% stenosed.  Unsuccessful PTCA to mid RCA  Assessment & Recommendations:  65 y.o. African American female  with hypertension, hyperlipidemia, uncontrolled type 2 DM (A1C 15%), bipolar disorder, NSTEMI  NSTEMI: Recurrent admissions with DKA, likely due to noncompliance as well as underlying CAD and  Culprit lesion mid RCA, also has severe non-culprit lesion in mid LAD Ideally, needs RCA and LAD intervention. She has had recurrent hospitalizations with chest pain, shortness of breath and DKA. The latteer likely combination of inadequate diabetes treatment at baseline, as well as obstructive CAD. Compliance is a concern. I had a long discussion with the patient and daughter Audrea Muscat and emphazied the importance of DAPT compliance after complex PCI for NSTEMI. Both are agreeing with compliance. Patient is going to move in with her brother in [place] later this month.   She has had further increase of creatinine to 2.1, but she is urinating.  Not all that urine output is accurately measured.  Close to 1.7-2.1 may be her new baseline of her renal function.  I do not anticipate that renal function will significantly improve.  I discussed with my  partner Dr. Einar Gip who who will be performing procedures in Cath Lab today.  Given that she has received IV fluids as well as Lasix and maintain urine output, her  recurrent hospital admissions with chest pain and DKA in presence of obstructive CAD, it may still be worthwhile to perform revascularization to mid RCA with has little contrast as possible.  Subjective renal function, ideally would recommend revascularization of LAD as well in the near future.  We will follow this outpatient.  Risks, benefits, alternate options discussed with the patient and daughter.  Type 2 DM: Glycemic control is improving. Defer to primary team She has had significant variability in her blood sugars, as has 300s, as low as 40.  Hyeprlipidemia: Continue lipitor 40 mg  Hypertension: Continue baseline therapy  AKI: As above.    I discussed the above findings with hospitalist Dr. Cathlean Sauer, patient, and daughter Pearson Forster.    Nigel Mormon, MD Pager: [phone] Office: [phone]

## 2020-09-09 NOTE — Plan of Care (Signed)
Educated patient on fall precautions and that pt is considered fall risk and risk of bleeding d/t heparin and per policy bed/chair alarm should be used. Pt states understanding but adamantly refusing bed/chair alarm. Pt prefers to sleep in recliner. Ambulating in room with IV pole independently, gait steady. Instructed pt to call staff before getting up d/t fall risk and especially since refusing alarm; pt agrees to call but later found walking in room without calling staff. Ongoing education for fall prevention provided.    Problem: Education: Goal: Knowledge of General Education information will improve Description: Including pain rating scale, medication(s)/side effects and non-pharmacologic comfort measures Outcome: Progressing   Problem: Activity: Goal: Risk for activity intolerance will decrease Outcome: Progressing   Problem: Coping: Goal: Level of anxiety will decrease Outcome: Progressing   Problem: Pain Managment: Goal: General experience of comfort will improve Outcome: Progressing

## 2020-09-09 NOTE — Progress Notes (Signed)
Right groin site had minimal bleeding, no hematoma present. Pressure held 15 minutes, dressing marked. Vitals stable- HR 70, BP 106/65 MD Tolia notified. Continue to monitor and extend bedrest for 4 hours.

## 2020-09-09 NOTE — Progress Notes (Signed)
Hypoglycemic Event  CBG: 41  Treatment: 8 oz juice/soda, graham crackers and peanut butter  Symptoms:  nausea, anxiety  Follow-up CBG: Time: 2044 CBG Result: 85, symptoms resolved.   Possible Reasons for Event: Unknown  Comments/MD notified: Dr. Aileen Fass notified via page.     Erica Mercer

## 2020-09-09 NOTE — Progress Notes (Signed)
Inpatient Diabetes Program Recommendations  AACE/ADA: New Consensus Statement on Inpatient Glycemic Control (2015)  Target Ranges:  Prepandial:   less than 140 mg/dL      Peak postprandial:   less than 180 mg/dL (1-2 hours)      Critically ill patients:  140 - 180 mg/dL   Lab Results  Component Value Date   GLUCAP 323 (H) 09/09/2020   HGBA1C 15.4 (H) 08/20/2020    Review of Glycemic Control Results for CAISEY, CHERY (MRN [id number]) as of 09/09/2020 10:21  Ref. Range 09/08/2020 20:25 09/08/2020 20:44 09/08/2020 22:26 09/09/2020 02:51 09/09/2020 07:36  Glucose-Capillary Latest Ref Range: 70 - 99 mg/dL 41 (LL) 85 172 (H) 374 (H) 323 (H)   Diabetes history: DM Outpatient Diabetes medications: Lantus 20 units daily, Humalog 3 units TID with meals Current orders for Inpatient glycemic control: Semglee 12 units QD, Novolog 0-9 units TID with meals, Novolog 0-5 units QHS, Novolog 3 units TID with meals for meal coverage  Inpatient Diabetes Program Recommendations:   Well known to the inpatient diabetes team.  This is pt's 8th admission since January 2022 and pt has also had 12 ED visits since January 2022.  Has been counseled before by the diabetes team and continues to have extremely poor glucose control at home.  Noted hypoglycemia yesterday of 41 mg/dL.  Of note, correction given 3 hours following CBG, placing patient at risk for hypoglycemic event. In addition meal coverage given without parameters. Will update meal coverage orders.  At this time consider: -Novolog 0-6 units TID -Semglee 16 units QD -update meal coverage orders to ONLY give if patient consumes >50% of meal and within ONE hour of previous CBG.   Thanks, Bronson Curb, MSN, RNC-OB Diabetes Coordinator [phone] (8a-5p)

## 2020-09-09 NOTE — Care Management Important Message (Signed)
Important Message  Patient Details  Name: Erica Mercer MRN: [id number] Date of Birth: May 30, 1955   Medicare Important Message Given:  Yes     Shelda Altes 09/09/2020, 11:42 AM

## 2020-09-09 NOTE — Progress Notes (Signed)
CSW received verbal consult from patients nurse Raquel Sarna that patient needs assist with transportation. CSW met with patient at bedside and offered patient SCAT application. Assessment completed. CSW answered all questions.CSW offered patient psychiatry resources. Patient accepted and thanked CSW.

## 2020-09-09 NOTE — Progress Notes (Signed)
Subjective:  Feels well No chest pain currently  Cr up to 2.1. Lot of urinary incontinence. Urine output not accurate  Objective:  Vital Signs in the last 24 hours: Temp:  [97.5 F (36.4 C)-98.9 F (37.2 C)] 98.9 F (37.2 C) (08/16 0518) Pulse Rate:  [68] 68 (08/15 2058) Resp:  [18] 18 (08/16 0518) BP: (137-153)/(74-85) 137/82 (08/15 2058) SpO2:  [98 %] 98 % (08/15 2058)  Intake/Output from previous day: 08/15 0701 - 08/16 0700 In: 1667.4 [P.O.:240; I.V.:1427.4] Out: 400 [Urine:400]  Physical Exam Vitals and nursing note reviewed.  Constitutional:      General: She is not in acute distress.    Appearance: She is well-developed and underweight.  HENT:     Head: Normocephalic and atraumatic.  Eyes:     Conjunctiva/sclera: Conjunctivae normal.     Pupils: Pupils are equal, round, and reactive to light.  Neck:     Vascular: No JVD.  Cardiovascular:     Rate and Rhythm: Normal rate and regular rhythm.     Pulses: Intact distal pulses.          Dorsalis pedis pulses are 1+ on the right side and 0 on the left side.       Posterior tibial pulses are 1+ on the right side and 1+ on the left side.     Heart sounds: No murmur heard. Pulmonary:     Effort: Pulmonary effort is normal.     Breath sounds: Normal breath sounds. No wheezing or rales.  Abdominal:     General: Bowel sounds are normal.     Palpations: Abdomen is soft.     Tenderness: There is no rebound.  Musculoskeletal:        General: No tenderness. Normal range of motion.     Right lower leg: Edema (Trace) present.     Left lower leg: Edema (Trace) present.  Lymphadenopathy:     Cervical: No cervical adenopathy.  Skin:    General: Skin is warm and dry.  Neurological:     Mental Status: She is alert and oriented to person, place, and time.     Cranial Nerves: No cranial nerve deficit.     Comments: In good spirits today. Waiting to find out if we will do the procedure today.   Psychiatric:     Comments:  Tearful   No change in physical exam compared to 09/06/2020  Lab Results: BMP Recent Labs    09/08/20 0222 09/08/20 1232 09/09/20 0247  NA 137 138 133*  K 4.5 4.0 4.6  CL 106 107 104  CO2 23 23 21*  GLUCOSE 172* 173* 386*  BUN 42* 44* 52*  CREATININE 1.93* 1.94* 2.14*  CALCIUM 8.9 8.9 8.5*  GFRNONAA 28* 28* 25*     CBC Recent Labs  Lab 09/05/20 0453 09/05/20 2017 09/09/20 0247  WBC 9.7   < > 6.6  RBC 2.45*   < > 2.15*  HGB 8.2*   < > 7.1*  HCT 24.9*   < > 22.5*  PLT 279   < > 227  MCV 101.6*   < > 104.7*  MCH 33.5   < > 33.0  MCHC 32.9   < > 31.6  RDW 15.1   < > 14.8  LYMPHSABS 3.2  --   --   MONOABS 0.3  --   --   EOSABS 0.1  --   --   BASOSABS 0.0  --   --    < > =  values in this interval not displayed.     HEMOGLOBIN A1C Lab Results  Component Value Date   HGBA1C 15.4 (H) 08/20/2020   MPG 395 08/20/2020    Cardiac Panel (last 3 results) No results for input(s): CKTOTAL, CKMB, TROPONINI, RELINDX in the last 8760 hours.  BNP (last 3 results) Recent Labs    09/03/20 1035  BNP 1,371.8*     TSH Recent Labs    08/01/20 0126 09/03/20 1320  TSH 1.432 1.029     Lipid Panel     Component Value Date/Time   CHOL 267 (H) 08/21/2020 0432   TRIG 243 (H) 08/21/2020 0432   HDL 65 08/21/2020 0432   CHOLHDL 4.1 08/21/2020 0432   VLDL 49 (H) 08/21/2020 0432   LDLCALC 153 (H) 08/21/2020 0432     Hepatic Function Panel Recent Labs    08/20/20 0002 09/03/20 1035 09/06/20 0530  PROT 6.7 6.6 5.1*  ALBUMIN 3.7 3.2* 2.4*  AST 36 86* 109*  ALT 38 55* 51*  ALKPHOS 176* 143* 124  BILITOT 1.4* 0.8 0.4  BILIDIR  --  0.1 <0.1  IBILI  --  0.7 NOT CALCULATED      Cardiac Studies:  EKG 09/04/2020: Sinus rhythm Possible old anteroseptal infarct Inferolateral ST depression   Echocardiogram 09/06/2020:  1. Left ventricular ejection fraction, by estimation, is 55 to 60%. The  left ventricle has normal function. The left ventricle has no regional   wall motion abnormalities. There is mild left ventricular hypertrophy.  Left ventricular diastolic parameters  are consistent with Grade I diastolic dysfunction (impaired relaxation).   2. Right ventricular systolic function is normal. The right ventricular  size is normal. There is normal pulmonary artery systolic pressure.   3. Left atrial size was mildly dilated.   4. Possible pericardial cyst adjacent to basal inferolateral left  ventricle.   5. No significant valvular abnormality. Normal right atrial pressure.   6. No significant change compared to previous study on 07/31/2020.   Coronary angiography 09/05/2020 [facility]):   Mid RCA lesion is 99% stenosed.   Prox LAD to Mid LAD lesion is 65% stenosed. (Pesonally reviewed. Appears 80%)   Mid Cx lesion is 40% stenosed.  Unsuccessful PTCA to mid RCA  Assessment & Recommendations:  65 y.o. African American female  with hypertension, hyperlipidemia, uncontrolled type 2 DM (A1C 15%), bipolar disorder, NSTEMI  NSTEMI: Recurrent admissions with DKA, likely due to noncompliance as well as underlying CAD and  Culprit lesion mid RCA, also has severe non-culprit lesion in mid LAD Ideally, needs RCA and LAD intervention. She has had recurrent hospitalizations with chest pain, shortness of breath and DKA. The latteer likely combination of inadequate diabetes treatment at baseline, as well as obstructive CAD. Compliance is a concern. I had a long discussion with the patient and daughter Erica Mercer and emphazied the importance of DAPT compliance after complex PCI for NSTEMI. Both are agreeing with compliance. Patient is going to move in with her brother in [place] later this month.   She has had further increase of creatinine to 2.1, but she is urinating.  Not all that urine output is accurately measured.  Close to 1.7-2.1 may be her new baseline of her renal function.  I do not anticipate that renal function will significantly improve.  I discussed with my  partner Dr. Einar Mercer who who will be performing procedures in Cath Lab today.  Given that she has received IV fluids as well as Lasix and maintain urine output, her  recurrent hospital admissions with chest pain and DKA in presence of obstructive CAD, it may still be worthwhile to perform revascularization to mid RCA with has little contrast as possible.  Subjective renal function, ideally would recommend revascularization of LAD as well in the near future.  We will follow this outpatient.  Risks, benefits, alternate options discussed with the patient and daughter.  Type 2 DM: Glycemic control is improving. Defer to primary team She has had significant variability in her blood sugars, as has 300s, as low as 40.  Hyeprlipidemia: Continue lipitor 40 mg  Hypertension: Continue baseline therapy  AKI: As above.    I discussed the above findings with hospitalist Dr. Cathlean Sauer, patient, and daughter Erica Mercer.    Nigel Mormon, MD Pager: [phone] Office: [phone]

## 2020-09-09 NOTE — Progress Notes (Signed)
ANTICOAGULATION CONSULT NOTE - Follow Up Consult  Pharmacy Consult for heparin Indication: chest pain/ACS  No Known Allergies  Patient Measurements: Height: '5\' 2"'$  (157.5 cm) Weight: 43 kg (94 lb 12.8 oz) IBW/kg (Calculated) : 50.1 Heparin Dosing Weight: 43 kg  Vital Signs: Temp: 98.9 F (37.2 C) (08/16 0518) Temp Source: Oral (08/16 0518)  Labs: Recent Labs    09/07/20 0230 09/07/20 1631 09/08/20 0222 09/08/20 1232 09/09/20 0247  HGB 7.5*  --  7.9*  --  7.1*  HCT 23.7*  --  25.0*  --  22.5*  PLT 237  --  233  --  227  HEPARINUNFRC 0.38  --  0.50  --  0.38  CREATININE  --    < > 1.93* 1.94* 2.14*   < > = values in this interval not displayed.     Estimated Creatinine Clearance: 17.8 mL/min (A) (by C-G formula based on SCr of 2.14 mg/dL (H)).   Assessment: Pt was transferred from [facility] on 09/05/20 for atherectomy on Monday, 09/08/20 after an unsuccessful attempt at balloon angioplasty of RCA. Heparin was resumed post cath. Plans noted for stent placement today.  -heparin level at goal -Hg= 7.1   Goal of Therapy:  Heparin level 0.3-0.7 units/ml Monitor platelets by anticoagulation protocol: Yes   Plan:  -Continue heparin at 800 units/hr -Daily heparin level and CBC -Will follow plans post cath  Hildred Laser, PharmD Clinical Pharmacist **Pharmacist phone directory can now be found on [email or web address] ([email or web address]).  Listed under [place].

## 2020-09-09 NOTE — Progress Notes (Addendum)
ANTICOAGULATION CONSULT NOTE - Follow Up Consult  Pharmacy Consult for IV Heparin Indication: chest pain/ACS  No Known Allergies  Patient Measurements: Height: '5\' 2"'$  (157.5 cm) Weight: 43 kg (94 lb 12.8 oz) IBW/kg (Calculated) : 50.1 Heparin Dosing Weight: 43 kg  Vital Signs: Temp: 98.4 F (36.9 C) (08/16 1137) Temp Source: Oral (08/16 1137) BP: 109/66 (08/16 1459) Pulse Rate: 0 (08/16 1504)  Labs: Recent Labs    09/07/20 0230 09/07/20 1631 09/08/20 0222 09/08/20 1232 09/09/20 0247  HGB 7.5*  --  7.9*  --  7.1*  HCT 23.7*  --  25.0*  --  22.5*  PLT 237  --  233  --  227  HEPARINUNFRC 0.38  --  0.50  --  0.38  CREATININE  --    < > 1.93* 1.94* 2.14*   < > = values in this interval not displayed.    Estimated Creatinine Clearance: 17.8 mL/min (A) (by C-G formula based on SCr of 2.14 mg/dL (H)).  Assessment: 65 yr old woman transferred from [facility] on [facility]/20 for atherectomy on Monday, 09/08/20 after an unsuccessful attempt at balloon angioplasty of RCA. Heparin was resumed post cath.   Pt is S/P PTCA and stenting of RCA this afternoon. Pharmacy is consulted to resume heparin 8 hrs after sheath removal. Per cath lab RN Elza Rafter), sheath was removed at 14:42 PM).  Heparin level earlier today (0.38 units/ml) on heparin infusion at 800 units/hr was within the goal range for this pt. H/H 7.1/22.5, plt 227. Per 2H RN, no bleeding observed post cath.  Goal of Therapy:  Heparin level 0.3-0.7 units/ml Monitor platelets by anticoagulation protocol: Yes   Plan:  Resume heparin infusion at 800 units/hr at 2300 (~8 hrs after sheath removed) Check heparin level 8 hrs after heparin infusion resumed Monitor daily heparin level and CBC Monitor for bleeding  Gillermina Hu, PharmD, BCPS, [facility] Clinical Pharmacist

## 2020-09-09 NOTE — Progress Notes (Addendum)
PROGRESS NOTE    Erianne Ladas  [id number] DOB: [birth year] DOA: 09/05/2020 PCP: [name], NP    Brief Narrative:  Mrs. Mcnamar was admitted to the hospital working diagnosis of non-ST elevation myocardial infarction complicated by acute diastolic heart failure decompensation and acute kidney injury.   65 year old female past medical history for type II [place] mellitus, hypertension, dyslipidemia, chronic anemia, bipolar disorder who presented with chest pain.  She woke up with severe chest pain 8 out of 10, pressure-like associated with dyspnea and lightheadedness.  In the ED she was diagnosed with non-ST relation myocardial infarction and underwent emergent cardiac catheterization, 90% stenosis of RCA with severe calcification.  Unable to intervene, transferred from [facility] to [facility] for further invasive procedures.  On her initial physical examination blood pressures was 160/100, heart rate 66, respirate 16, temperature 98.1, oxygen saturation 100%.  Her lungs had rales bilaterally, heart S1-S2, present, rhythmic, soft abdomen, no lower extremity edema.   Sodium 140, potassium 4.2, chloride 106, bicarb 26, glucose 134, BUN 58, creatinine 2.0, white count 9.7, hemoglobin 8.2, hematocrit 34.9, platelets 279. High sensitive troponin 154-237. SARS COVID-19 negative.   Urinalysis specific gravity 1.011. >  500 glucose. Toxicology screen positive for cannabinoid.   Chest radiograph with increased lung markings bilaterally, fluid in the right fissure.   EKG 102 bpm, normal axis, normal intervals, sinus rhythm, ST segment depression V5-V6 no significant ST wave changes, positive LVH.   Patient has been placed on heparin drip. Received IV furosemide for volume overload.  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  Transferred to ICU for close monitoring.     Assessment & Plan:   Principal Problem:   NSTEMI (non-ST elevated myocardial infarction) ([place]) Active Problems:   Severe protein-calorie malnutrition (HCC)   Essential hypertension   HLD (hyperlipidemia)   COPD (chronic obstructive pulmonary disease) (HCC)   Bipolar 1 disorder (HCC)   Non-ST elevation (NSTEMI) myocardial infarction ([place])   AKI (acute kidney injury) ([place])   Stage 3a chronic kidney disease (HCC)   Type II diabetes mellitus with renal manifestations ([place])   NSTEMI. Continue with no chest pain, but mild dyspnea.   Echocardiogram with LV systolic function 55 to [id number] with no wall motion abnormalities. RV systolic function is preserved.    Medical management with carvedilol, sosorbide, amlodipine and hydralazine.  atorvastatin.  Further antiplatelet therapy and anticoagulation per cardiology recommendations    On oxycodone and hydromorphone for pain control.     2. T2DM with uncontrolled hypoglycemia/ hyperglycemia. dyslipidemia Positive hyperglycemia this am up to 386,  Continue with basal insulin, insulin sliding scale for glucose cover and monitoring. pre-meal insulin for pandrial hyperglycemia.  Patient today post procedure with poor oral intake.    Neuropathic pain, diabetic neuropathy, reduced dose for low GFR, continue with gabapentin.  Continue with statin therapy    3. AKI on CKD stage 3a  Renal function with serum cr at 2,14 with K at 4,6 and serum bicarbonate at 21. Continue close monitoring. Holding diuresis for now.  Avoid hypotension and nephrotoxic medications    4. Acute diastolic heart failure, uncontrolled HTN/ hypertensive emergency.  Blood pressure control with isosorbide, amlodipine and hydralazine.    5. Depression, severe calorie protein malnutrition.  Nutritional supplements.  On mirtazapine and brexpiprazole    6. Chronic anemia. hgb is 7,1 and Hct 22,5. Follow up in am if continue worsening will consider PRBC  transfusion      Patient continue to be at high risk for worsening ACS   Status is: Inpatient  Remains inpatient appropriate because:Inpatient level of care appropriate due to severity of illness  Dispo: The patient is from: Home              Anticipated d/c is to: Home              Patient currently is not medically stable to d/c.   Difficult to place patient No   DVT prophylaxis:  Heparin per cardiology recommendations   Code Status:   full  Family Communication:   No family at the bedside      Nutrition Status: Nutrition Problem: Increased nutrient needs Etiology: chronic illness (heart failure) Signs/Symptoms: estimated needs Interventions: [place] shake   Consultants:  Cardiology   Procedures:   Cardiac cath     Subjective: Patient with no chest pain or dyspnea post procedure, no nausea or vomiting,   Objective: Vitals:   09/09/20 1504 09/09/20 1545 09/09/20 1600 09/09/20 1630  BP:  113/66 105/66 116/77  Pulse: (!) 0  71 71  Resp: '15 19 17 16  '$ Temp:    (!) 96.5 F (35.8 C)  TempSrc:    Axillary  SpO2: (!) 0% 93% 94% 94%  Weight:      Height:        Intake/Output Summary (Last 24 hours) at 09/09/2020 1701 Last data filed at 09/09/2020 1630 Gross per 24 hour  Intake 1151.49 ml  Output 1250 ml  Net -98.51 ml   Filed Weights   09/05/20 1947  Weight: 43 kg    Examination:   General: Not in pain or dyspnea Neurology: Awake and alert, non focal  E ENT: mild pallor, no icterus, oral mucosa moist Cardiovascular: No JVD. S1-S2 present, rhythmic, no gallops, rubs, or murmurs. trace lower extremity edema. Pulmonary: positive breath sounds bilaterally with no wheezing, rhonchi or rales. Gastrointestinal. Abdomen non tender Skin. No rashes Musculoskeletal: no joint deformities     Data Reviewed: I have personally reviewed following labs and imaging studies  CBC: Recent Labs  Lab 09/05/20 0453 09/06/20 0531 09/07/20 0230 09/08/20 0222 09/09/20 0247  WBC 9.7  6.8 8.0 7.8 6.6  NEUTROABS 6.0  --   --   --   --   HGB 8.2* 8.5* 7.5* 7.9* 7.1*  HCT 24.9* 26.7* 23.7* 25.0* 22.5*  MCV 101.6* 104.7* 102.6* 103.7* 104.7*  PLT 279 261 237 233 [id number]   Basic Metabolic Panel: Recent Labs  Lab 09/06/20 0530 09/07/20 1631 09/08/20 0222 09/08/20 1232 09/09/20 0247  NA 135 139 137 138 133*  K 4.4 4.5 4.5 4.0 4.6  CL 104 108 106 107 104  CO2 '24 23 23 23 '$ 21*  GLUCOSE 483* 143* 172* 173* 386*  BUN 47* 42* 42* 44* 52*  CREATININE 1.79* 1.88* 1.93* 1.94* 2.14*  CALCIUM 8.7* 8.8* 8.9 8.9 8.5*   GFR: Estimated Creatinine Clearance: 17.8 mL/min (A) (by C-G formula based on SCr of 2.14 mg/dL (H)). Liver Function Tests: Recent Labs  Lab 09/03/20 1035 09/06/20 0530  AST 86* 109*  ALT 55* 51*  ALKPHOS 143* 124  BILITOT 0.8 0.4  PROT 6.6 5.1*  ALBUMIN 3.2* 2.4*   No results for input(s): LIPASE, AMYLASE in the last 168 hours. No results for input(s): AMMONIA in the last 168 hours. Coagulation Profile: Recent Labs  Lab 09/03/20 1320 09/04/20 0924  INR 1.0 1.0   Cardiac Enzymes: No results for  input(s): CKTOTAL, CKMB, CKMBINDEX, TROPONINI in the last 168 hours. BNP (last 3 results) No results for input(s): PROBNP in the last 8760 hours. HbA1C: No results for input(s): HGBA1C in the last 72 hours. CBG: Recent Labs  Lab 09/08/20 2226 09/09/20 0251 09/09/20 0736 09/09/20 1135 09/09/20 1634  GLUCAP 172* 374* 323* 105* 185*   Lipid Profile: No results for input(s): CHOL, HDL, LDLCALC, TRIG, CHOLHDL, LDLDIRECT in the last 72 hours. Thyroid Function Tests: No results for input(s): TSH, T4TOTAL, FREET4, T3FREE, THYROIDAB in the last 72 hours. Anemia Panel: No results for input(s): VITAMINB12, FOLATE, FERRITIN, TIBC, IRON, RETICCTPCT in the last 72 hours.    Radiology Studies: I have reviewed all of the imaging during this hospital visit personally     Scheduled Meds:  amLODipine  10 mg Oral Daily   aspirin EC  81 mg Oral Daily    atorvastatin  40 mg Oral Daily   brexpiprazole  1 mg Oral Daily   carvedilol  25 mg Oral BID WC   Chlorhexidine Gluconate Cloth  6 each Topical Daily   famotidine  20 mg Oral QHS   feeding supplement (NEPRO CARB STEADY)  237 mL Oral BID BM   gabapentin  300 mg Oral BID   hydrALAZINE  50 mg Oral Q8H   insulin aspart  0-5 Units Subcutaneous QHS   insulin aspart  0-9 Units Subcutaneous TID WC   insulin aspart  3 Units Subcutaneous TID WC   insulin glargine-yfgn  12 Units Subcutaneous Daily   isosorbide mononitrate  30 mg Oral Daily   mirtazapine  15 mg Oral QHS   nicotine  21 mg Transdermal Daily   pantoprazole  40 mg Oral BID   [START ON 09/10/2020] sodium chloride flush  3 mL Intravenous Q12H   ticagrelor  90 mg Oral BID   Continuous Infusions:  sodium chloride 50 mL/hr at 09/09/20 0253   [START ON 09/10/2020] sodium chloride     sodium chloride 1 mL/kg/hr (09/09/20 1640)   heparin       LOS: 4 days        Shanyla Marconi Gerome Apley, MD

## 2020-09-09 NOTE — Progress Notes (Signed)
I have discussed the findings of the cardiac catheterization with the patient's daughter 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  Fortunately patient remains completely asymptomatic without any chest pain or dyspnea, no EKG abnormalities.  I have also advised her that in case she has significant chest pain or EKG abnormalities at night or in the evening during observation in the ICU, I would not be bringing her back to the Cath Lab as intervention will be futile as I will not be able to perform PCI to the right coronary artery.  Best option is for her to heal, in 4 to 6 weeks consider PCI to the LAD and relook RCA at that time.  I suspect she probably has collaterals from the left system to the right coronary artery for her not to have any EKG abnormalities or chest pain.  I utilized 30 mL of contrast.   Adrian Prows, MD, [facility] 09/09/2020, 3:06 PM Office: [phone] Fax: [phone] Pager: [phone]

## 2020-09-09 NOTE — Interval H&P Note (Signed)
History and Physical Interval Note:  09/09/2020 12:55 PM  Erica Mercer  has presented today for surgery, with the diagnosis of chest pain.  The various methods of treatment have been discussed with the patient and family. After consideration of risks, benefits and other options for treatment, the patient has consented to  Procedure(s): CORONARY STENT INTERVENTION (N/A) as a surgical intervention.  The patient's history has been reviewed, patient examined, no change in status, stable for surgery.  I have reviewed the patient's chart and labs.  Questions were answered to the patient's satisfaction.   Extensive discussion has been held between myself and my partner Dr. Virgina Jock, family members.  It appears that patient's serum creatinine is stabilized around 2.0, I do not think it is can go down further.  Patient is at extreme high risk for recurrent cardiac events, has a high-grade and near subtotaled dominant right coronary artery and a severe proximal LAD stenosis.  Right coronary artery appears to be culprit for now, will proceed with PCI today.  Only if minimal contrast is utilized, I may consider attempt at PCI to the LAD otherwise she will need staged invention to the LAD.  All questions have been answered.  Adrian Prows

## 2020-09-10 ENCOUNTER — Other Ambulatory Visit: Payer: Self-pay

## 2020-09-10 ENCOUNTER — Encounter (HOSPITAL_COMMUNITY): Payer: Self-pay | Admitting: Cardiology

## 2020-09-10 ENCOUNTER — Inpatient Hospital Stay (HOSPITAL_COMMUNITY): Payer: Medicare Other

## 2020-09-10 LAB — CBC
HCT: 21.7 % — ABNORMAL LOW (ref 36.0–46.0)
Hemoglobin: 6.9 g/dL — CL (ref 12.0–15.0)
MCH: 33.3 pg (ref 26.0–34.0)
MCHC: 31.8 g/dL (ref 30.0–36.0)
MCV: 104.8 fL — ABNORMAL HIGH (ref 80.0–100.0)
Platelets: 244 10*3/uL (ref 150–400)
RBC: 2.07 MIL/uL — ABNORMAL LOW (ref 3.87–5.11)
RDW: 14.9 % (ref 11.5–15.5)
WBC: 9.9 10*3/uL (ref 4.0–10.5)
nRBC: 0 % (ref 0.0–0.2)

## 2020-09-10 LAB — BASIC METABOLIC PANEL
Anion gap: 10 (ref 5–15)
BUN: 62 mg/dL — ABNORMAL HIGH (ref 8–23)
CO2: 19 mmol/L — ABNORMAL LOW (ref 22–32)
Calcium: 8.9 mg/dL (ref 8.9–10.3)
Chloride: 108 mmol/L (ref 98–111)
Creatinine, Ser: 1.88 mg/dL — ABNORMAL HIGH (ref 0.44–1.00)
GFR, Estimated: 29 mL/min — ABNORMAL LOW (ref >60)
Glucose, Bld: 290 mg/dL — ABNORMAL HIGH (ref 70–99)
Potassium: 5 mmol/L (ref 3.5–5.1)
Sodium: 137 mmol/L (ref 135–145)

## 2020-09-10 LAB — IRON AND TIBC
Iron: 34 ug/dL (ref 28–170)
Saturation Ratios: 12 % (ref 10.4–31.8)
TIBC: 294 ug/dL (ref 250–450)
UIBC: 260 ug/dL

## 2020-09-10 LAB — VITAMIN B12: Vitamin B-12: 660 pg/mL (ref 180–914)

## 2020-09-10 LAB — RETICULOCYTES
Immature Retic Fract: 30.6 % — ABNORMAL HIGH (ref 2.3–15.9)
RBC.: 1.81 MIL/uL — ABNORMAL LOW (ref 3.87–5.11)
Retic Count, Absolute: 91.9 10*3/uL (ref 19.0–186.0)
Retic Ct Pct: 5.1 % — ABNORMAL HIGH (ref 0.4–3.1)

## 2020-09-10 LAB — ECHOCARDIOGRAM LIMITED
Height: 62 in
Weight: 1516.76 oz

## 2020-09-10 LAB — PREPARE RBC (CROSSMATCH)

## 2020-09-10 LAB — GLUCOSE, CAPILLARY
Glucose-Capillary: 385 mg/dL — ABNORMAL HIGH (ref 70–99)
Glucose-Capillary: 285 mg/dL — ABNORMAL HIGH (ref 70–99)
Glucose-Capillary: 305 mg/dL — ABNORMAL HIGH (ref 70–99)
Glucose-Capillary: 313 mg/dL — ABNORMAL HIGH (ref 70–99)

## 2020-09-10 LAB — FERRITIN: Ferritin: 65 ng/mL (ref 11–307)

## 2020-09-10 LAB — ABO/RH: ABO/RH(D): A POS

## 2020-09-10 LAB — FOLATE: Folate: 28.8 ng/mL (ref >5.9)

## 2020-09-10 MED ORDER — GLUCERNA SHAKE PO LIQD
237.0000 mL | Freq: Two times a day (BID) | ORAL | Status: DC
  Administered 2020-09-10 – 2020-09-18 (×13): 237 mL via ORAL

## 2020-09-10 MED ORDER — HEPARIN (PORCINE) 25000 UT/250ML-% IV SOLN: 800.0000 [IU]/h | INTRAVENOUS | Status: DC

## 2020-09-10 MED ORDER — ATROPINE SULFATE 1 MG/ML IJ SOLN: 0.4000 mg | Freq: Once | INTRAMUSCULAR | Status: AC

## 2020-09-10 MED ORDER — BISACODYL 10 MG RE SUPP
10.0000 mg | Freq: Once | RECTAL | Status: AC
  Administered 2020-09-10: 10 mg via RECTAL
  Filled 2020-09-10: qty 1

## 2020-09-10 MED ORDER — ADULT MULTIVITAMIN W/MINERALS CH
1.0000 | ORAL_TABLET | Freq: Every day | ORAL | Status: DC
  Administered 2020-09-10 – 2020-09-18 (×9): 1 via ORAL
  Filled 2020-09-10 (×9): qty 1

## 2020-09-10 MED ORDER — SODIUM CHLORIDE 0.9 % IV SOLN: INTRAVENOUS | Status: AC

## 2020-09-10 MED ORDER — SENNOSIDES-DOCUSATE SODIUM 8.6-50 MG PO TABS
1.0000 | ORAL_TABLET | Freq: Two times a day (BID) | ORAL | Status: DC
  Administered 2020-09-10 – 2020-09-18 (×12): 1 via ORAL
  Filled 2020-09-10 (×15): qty 1

## 2020-09-10 MED ORDER — DOPAMINE-DEXTROSE 3.2-5 MG/ML-% IV SOLN
INTRAVENOUS | Status: AC
  Administered 2020-09-10: 5 ug/kg/min via INTRAVENOUS
  Filled 2020-09-10: qty 250

## 2020-09-10 MED ORDER — INSULIN GLARGINE-YFGN 100 UNIT/ML ~~LOC~~ SOLN
16.0000 [IU] | Freq: Every day | SUBCUTANEOUS | Status: DC
  Administered 2020-09-10: 16 [IU] via SUBCUTANEOUS
  Filled 2020-09-10 (×2): qty 0.16

## 2020-09-10 MED ORDER — INSULIN ASPART 100 UNIT/ML IJ SOLN
4.0000 [IU] | Freq: Three times a day (TID) | INTRAMUSCULAR | Status: DC
  Administered 2020-09-11 – 2020-09-16 (×10): 4 [IU] via SUBCUTANEOUS

## 2020-09-10 MED ORDER — HEPARIN SODIUM (PORCINE) 5000 UNIT/ML IJ SOLN
5000.0000 [IU] | Freq: Three times a day (TID) | INTRAMUSCULAR | Status: DC
  Administered 2020-09-10 – 2020-09-13 (×9): 5000 [IU] via SUBCUTANEOUS
  Filled 2020-09-10 (×9): qty 1

## 2020-09-10 MED ORDER — FENTANYL CITRATE (PF) 100 MCG/2ML IJ SOLN
50.0000 ug | INTRAMUSCULAR | Status: DC | PRN
  Administered 2020-09-10 – 2020-09-11 (×8): 50 ug via INTRAVENOUS
  Filled 2020-09-10 (×8): qty 2

## 2020-09-10 MED ORDER — ISOSORBIDE MONONITRATE ER 30 MG PO TB24
30.0000 mg | ORAL_TABLET | Freq: Every day | ORAL | Status: DC
  Administered 2020-09-10 – 2020-09-13 (×4): 30 mg via ORAL
  Filled 2020-09-10 (×4): qty 1

## 2020-09-10 MED ORDER — ATROPINE SULFATE 1 MG/10ML IJ SOSY
PREFILLED_SYRINGE | INTRAMUSCULAR | Status: AC
  Administered 2020-09-10: 1 mg
  Filled 2020-09-10: qty 10

## 2020-09-10 MED ORDER — SODIUM CHLORIDE 0.9% IV SOLUTION: Freq: Once | INTRAVENOUS | Status: AC

## 2020-09-10 MED ORDER — SODIUM CHLORIDE 0.9 % IV BOLUS
300.0000 mL | Freq: Once | INTRAVENOUS | Status: AC
  Administered 2020-09-10: 300 mL via INTRAVENOUS

## 2020-09-10 MED ORDER — PROSOURCE PLUS PO LIQD
30.0000 mL | Freq: Two times a day (BID) | ORAL | Status: DC
  Administered 2020-09-10 – 2020-09-18 (×13): 30 mL via ORAL
  Filled 2020-09-10 (×13): qty 30

## 2020-09-10 MED ORDER — DOPAMINE-DEXTROSE 3.2-5 MG/ML-% IV SOLN: 5.0000 ug/kg/min | INTRAVENOUS | Status: DC

## 2020-09-10 MED FILL — Lidocaine Inj 1% w/ Epinephrine-1:100000: INTRAMUSCULAR | Qty: 20 | Status: AC

## 2020-09-10 MED FILL — Cefazolin Sodium-Dextrose IV Solution 2 GM/100ML-4%: INTRAVENOUS | Qty: 100 | Status: AC

## 2020-09-10 NOTE — Progress Notes (Signed)
1730-Pt ambulated back to bed from bathroom, c/o dizziness and abdominal pain.  VSS, Dr. Einar Gip notified, no new orders at this time.

## 2020-09-10 NOTE — Progress Notes (Addendum)
Nutrition Follow Up  DOCUMENTATION CODES:   Underweight, Severe malnutrition in context of chronic illness  INTERVENTION:   Glucerna Shake po BID, each supplement provides 220 kcal and 10 grams of protein 30 ml ProSource Plus BID, each supplement provides 100 kcals and 15 grams protein.  MVI with minerals daily   NUTRITION DIAGNOSIS:   Severe Malnutrition related to chronic illness as evidenced by severe fat depletion, severe muscle depletion.  Ongoing  GOAL:   Patient will meet greater than or equal to 90% of their needs  Progressing  MONITOR:   PO intake, Supplement acceptance, Weight trends, Labs, I & O's  REASON FOR ASSESSMENT:   Consult Assessment of nutrition requirement/status  ASSESSMENT:   65 year old female past medical history for DM, hypertension, dyslipidemia, chronic anemia, bipolar disorder who presented with chest pain. Pt admitted to the hospital working diagnosis of non-ST elevation myocardial infarction complicated by acute diastolic heart failure decompensation and acute kidney injury.  Patient upset upon RD visit. Tech attempted to obtain CBG but patient refused. Unable to discuss nutrition history. Last meal completion charted from today was 50%. Will change supplement to Glucerna and patient has been known to consume these during previous admissions.   Admission weight: 43 kg  No current weight obtained   UOP: 850 ml x 24 hrs   Drips: dopamine Medications: SS novolog, semglee, remeron Labs: Cr 1.88- down from yesterday CBG 111-385  Flowsheet Row Most Recent Value  Orbital Region Moderate depletion  Upper Arm Region Severe depletion  Thoracic and Lumbar Region Unable to assess  Buccal Region Severe depletion  Temple Region Severe depletion  Clavicle Bone Region Severe depletion  Clavicle and Acromion Bone Region Severe depletion  Scapular Bone Region Unable to assess  Dorsal Hand Severe depletion  Patellar Region Severe depletion   Anterior Thigh Region Severe depletion  Posterior Calf Region Severe depletion  Hair Reviewed  Eyes Reviewed  Mouth Unable to assess  Skin Reviewed  Nails Reviewed      Diet Order:   Diet Order             Diet Carb Modified Fluid consistency: Thin; Room service appropriate? Yes  Diet effective now                   EDUCATION NEEDS:   Not appropriate for education at this time  Skin:  Skin Assessment: Reviewed RN Assessment  Last BM:  8/16  Height:   Ht Readings from Last 1 Encounters:  09/05/20 '5\' 2"'$  (1.575 m)    Weight:   Wt Readings from Last 1 Encounters:  09/05/20 43 kg    Ideal Body Weight:  50 kg  BMI:  Body mass index is 17.34 kg/m.  Estimated Nutritional Needs:   Kcal:  1600-1800  Protein:  75-90 grams  Fluid:  >/= 1.6 L/day  Mariana Single MS, RD, LDN, CNSC Clinical Nutrition Pager listed in [place]

## 2020-09-10 NOTE — Progress Notes (Signed)
  Echocardiogram 2D Echocardiogram has been performed.  Erica Mercer 09/10/2020, 9:45 AM

## 2020-09-10 NOTE — Progress Notes (Signed)
PROGRESS NOTE    Erica Mercer  [id number] DOB: 17-Mar-1955 DOA: 09/05/2020 PCP: [name], NP    No chief complaint on file.   Brief Narrative:  Erica Mercer is a 65 y.o. female with medical history significant of IDDM, HTN, HLD, chronic anemia, cigarette smoker, bipolar disorder, presented with SOB and chest pain.    Underwent left side cardiac cath at [facility]  which showed 90% RCA stenosis with severe calcification, unable to be ballooned.  I recommend patient to be transferred to [facility] for further intervention of RCA Monday.    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 Reports being constipated She reports right groin pain Daughter at bedside She states she will relocate to [place] in a month Assessment & Plan:   Principal Problem:   NSTEMI (non-ST elevated myocardial infarction) ([place]) Active Problems:   Severe protein-calorie malnutrition ([place])   Essential hypertension   HLD (hyperlipidemia)   COPD (chronic obstructive pulmonary disease) (HCC)   Bipolar 1 disorder (HCC)   Non-ST elevation (NSTEMI) myocardial infarction ([place])   AKI (acute kidney injury) ([employer])   Stage 3a chronic kidney disease (HCC)   Type II diabetes mellitus with renal manifestations (HCC)  Non-STEMI In the event of unsuccessful cardiac catheterization on 8/16 detail please refer to original report Currently she has no chest pain Management per cardiology  hypotension Per cardiology hypotension likely from above is 1 AV block possibly RV involvement from RCA  occlusion Anemia could also contribute Currently on dopamine drip Management per cardiology    Diastolic CHF Report present with bilateral lower extremity edema Today edema has much improved Close monitor volume status Cardiology managing   Acute on chronic anemia Hemoglobin at baseline appear to be between 8 and 9 -Hemoglobin 6.9 this morning -Does not appear to have active bleed, no bowel movement for several days -Received PRBC transfusion -per cardiology, No plan for CT abdomen as less likely retroperitoneal hematoma  FOBT ordered, pending collection  CKDIV Cr appear close to baseline, renal dosing medication Avoid nephrotoxin, avoid hypotension  Insulin-dependent type 2 diabetes, uncontrolled, with hyperglycemia A1c 15.4 Appear has been in the hospital multiple times for DKA this year Need diabetes education Continue adjust insulin, increase meal coverage, likely will need to increase long-acting insulin tomorrow,   Constipation Start stool softener  Nutritional Assessment: The patient's BMI is: Body mass index is 17.34 kg/m.Marland Kitchen  Seen by dietician.  I agree with the assessment and plan as outlined below:  Nutrition Status: Nutrition Problem: Severe Malnutrition Etiology: chronic illness Signs/Symptoms: severe fat depletion, severe muscle depletion Interventions: Refer to RD note for recommendations  .    Unresulted Labs (From admission, onward)     Start     Ordered   09/11/20 0500  Lactic acid, plasma  Tomorrow morning,   R       Question:  Specimen collection method  Answer:  Lab=Lab collect   09/10/20 1718   09/11/20 0500  CBC  Tomorrow morning,   R       Question:  Specimen collection method  Answer:  Lab=Lab collect   09/10/20  2120   09/10/20 1733  MRSA Next Gen by PCR, Nasal  Once,   R        09/10/20 1732   09/08/20 [id number]  Basic metabolic panel  Daily,   R     Question:  Specimen collection method  Answer:  Lab=Lab collect   09/07/20 1046    Unscheduled  Occult blood card to lab, stool  As needed,   R      09/05/20 1915              DVT prophylaxis: heparin injection 5,000 Units Start: 09/10/20 1400   Code Status: Full Family Communication: Daughter at bedside Disposition:   Status is: Inpatient   Dispo: The patient is from: Home              Anticipated d/c is to: To be determined              Anticipated d/c date is: Currently not medical stable to discharge                Consultants:  Cardiology  Procedures:  Cardiac catheterization  Antimicrobials:   None     Objective: Vitals:   09/10/20 1800 09/10/20 1900 09/10/20 1930 09/10/20 2000  BP: (!) 147/62 121/73  115/71  Pulse: 87 82 84 81  Resp: '17 15 16 19  '$ Temp:    99.1 F (37.3 C)  TempSrc:    Oral  SpO2: 100% 100% 100% 100%  Weight:      Height:        Intake/Output Summary (Last 24 hours) at 09/10/2020 2134 Last data filed at 09/10/2020 1700 Gross per 24 hour  Intake 1647.33 ml  Output 600 ml  Net 1047.33 ml   Filed Weights   09/05/20 1947  Weight: 43 kg    Examination:  General exam: calm, NAD Respiratory system: Clear to auscultation. Respiratory effort normal. Cardiovascular system: S1 & S2 heard, RRR. No JVD, no murmur, No pedal edema. Gastrointestinal system: Abdomen is nondistended, soft and nontender. Normal bowel sounds heard. Central nervous system: Alert and oriented. No focal neurological deficits. Extremities: Trace bilateral lower extremity edema, right greater than left, report has much improved compared to before Skin: No rashes, lesions or ulcers Psychiatry: Judgement and insight appear normal. Mood & affect appropriate.     Data Reviewed: I have personally reviewed following labs and imaging studies  CBC: Recent Labs  Lab 09/05/20 0453 09/06/20 0531 09/07/20 0230 09/08/20 0222 09/09/20 0247 09/10/20 0209  WBC 9.7 6.8 8.0 7.8 6.6 9.9  NEUTROABS 6.0  --   --   --   --   --   HGB 8.2* 8.5* 7.5*  7.9* 7.1* 6.9*  HCT 24.9* 26.7* 23.7* 25.0* 22.5* 21.7*  MCV 101.6* 104.7* 102.6* 103.7* 104.7* 104.8*  PLT 279 261 237 233 227 [id number]    Basic Metabolic Panel: Recent Labs  Lab 09/07/20 1631 09/08/20 0222 09/08/20 1232 09/09/20 0247 09/10/20 0209  NA 139 137 138 133* 137  K 4.5 4.5 4.0 4.6 5.0  CL 108 106 107 104 108  CO2 '23 23 23 '$ 21* 19*  GLUCOSE 143* 172* 173* 386* 290*  BUN 42* 42* 44* 52* 62*  CREATININE 1.88* 1.93* 1.94* 2.14* 1.88*  CALCIUM 8.8* 8.9 8.9 8.5* 8.9    GFR: Estimated Creatinine Clearance: 20.3 mL/min (A) (by C-G formula based on SCr of 1.88 mg/dL (H)).  Liver Function Tests: Recent Labs  Lab 09/06/20 0530  AST 109*  ALT  51*  ALKPHOS 124  BILITOT 0.4  PROT 5.1*  ALBUMIN 2.4*    CBG: Recent Labs  Lab 09/09/20 2024 09/10/20 1121 09/10/20 1414 09/10/20 1543 09/10/20 2119  GLUCAP 173* 385* 285* 305* 313*     Recent Results (from the past 240 hour(s))  SARS CORONAVIRUS 2 (TAT 6-24 HRS) Nasopharyngeal Nasopharyngeal Swab     Status: None   Collection Time: 09/03/20  2:15 PM   Specimen: Nasopharyngeal Swab  Result Value Ref Range Status   SARS Coronavirus 2 NEGATIVE NEGATIVE Final    Comment: (NOTE) SARS-CoV-2 target nucleic acids are NOT DETECTED.  The SARS-CoV-2 RNA is generally detectable in upper and lower respiratory specimens during the acute phase of infection. Negative results do not preclude SARS-CoV-2 infection, do not rule out co-infections with other pathogens, and should not be used as the sole basis for treatment or other patient management decisions. Negative results must be combined with clinical observations, patient history, and epidemiological information. The expected result is Negative.  Fact Sheet for Patients: SugarRoll.be  Fact Sheet for Healthcare Providers: https://www.woods-mathews.com/  This test is not yet approved or cleared by the [place] FDA and  has been  authorized for detection and/or diagnosis of SARS-CoV-2 by FDA under an Emergency Use Authorization (EUA). This EUA will remain  in effect (meaning this test can be used) for the duration of the COVID-19 declaration under Se ction 564(b)(1) of the Act, 21 U.S.C. section 360bbb-3(b)(1), unless the authorization is terminated or revoked sooner.  Performed at [facility], [place] [address], [place], [place] [address]          Radiology Studies: CARDIAC CATHETERIZATION  Result Date: 09/09/2020 Coronary angioplasty to right coronary artery on 09/09/2020: Unsuccessful attempt at PCI to the right coronary artery with attendant complication including extensive dissection involving the entire right coronary artery.  As there was still TIMI I-I 0.5 flow, to prevent retrograde dissection of the ostial RCA into the aorta and also to prevent further antegrade dissection or complete closure of the vessel, a 3.5 x 15 millimeters resolute frontier DES was deployed. Overall extensive dissection is evident involving the proximal and all the way to the distal RCA with no chance of successful angioplasty.  No devices could not cross after the initial angioplasty at the proximal right coronary artery segment.  1.5 mm balloon could not also cross.  This is in spite of GuideLiner support right from the get go.  Could not pass a heavier wire as well, after initial angioplasty I had attempted to place Iron Man guidewire but I was unable to cross the stenosis probably related to significant calcium and dissection at the focal site. 30 mL of contrast was utilized.  Patient will be admitted to the ICU, I have updated patient's daughter regarding the complication.   PERIPHERAL VASCULAR CATHETERIZATION  Result Date: 09/09/2020 Coronary angioplasty to right coronary artery on 09/09/2020: Unsuccessful attempt at PCI to the right coronary artery with attendant complication including extensive dissection involving the entire  right coronary artery.  As there was still TIMI I-I 0.5 flow, to prevent retrograde dissection of the ostial RCA into the aorta and also to prevent further antegrade dissection or complete closure of the vessel, a 3.5 x 15 millimeters resolute frontier DES was deployed. Overall extensive dissection is evident involving the proximal and all the way to the distal RCA with no chance of successful angioplasty.  No devices could not cross after the initial angioplasty at the proximal right  coronary artery segment.  1.5 mm balloon could not also cross.  This is in spite of GuideLiner support right from the get go.  Could not pass a heavier wire as well, after initial angioplasty I had attempted to place Iron Man guidewire but I was unable to cross the stenosis probably related to significant calcium and dissection at the focal site. 30 mL of contrast was utilized.  Patient will be admitted to the ICU, I have updated patient's daughter regarding the complication.        Scheduled Meds:  (feeding supplement) PROSource Plus  30 mL Oral BID BM   aspirin EC  81 mg Oral Daily   atorvastatin  40 mg Oral Daily   brexpiprazole  1 mg Oral Daily   Chlorhexidine Gluconate Cloth  6 each Topical Daily   famotidine  20 mg Oral QHS   feeding supplement (GLUCERNA SHAKE)  237 mL Oral BID BM   gabapentin  300 mg Oral BID   heparin injection (subcutaneous)  5,000 Units Subcutaneous Q8H   insulin aspart  0-5 Units Subcutaneous QHS   insulin aspart  0-9 Units Subcutaneous TID WC   [START ON 09/11/2020] insulin aspart  4 Units Subcutaneous TID WC   insulin glargine-yfgn  16 Units Subcutaneous Daily   isosorbide mononitrate  30 mg Oral Daily   mirtazapine  15 mg Oral QHS   multivitamin with minerals  1 tablet Oral Daily   nicotine  21 mg Transdermal Daily   pantoprazole  40 mg Oral BID   senna-docusate  1 tablet Oral BID   sodium chloride flush  3 mL Intravenous Q12H   ticagrelor  90 mg Oral BID   Continuous  Infusions:  sodium chloride 50 mL/hr at 09/09/20 1240   sodium chloride       LOS: 5 days   Time spent: 17mns Greater than 50% of this time was spent in counseling, explanation of diagnosis, planning of further management, and coordination of care.   Voice Recognition /Viviann Sparedictation system was used to create this note, attempts have been made to correct errors. Please contact the author with questions and/or clarifications.   FFlorencia Reasons MD PhD FACP [facility]  Available via Epic secure chat 7am-7pm for nonurgent issues Please page for urgent issues To page the attending provider between 7A-7P or the covering provider during after hours 7P-7A, please log into the web site [email or web address] and access using universal [place] password for that web site. If you do not have the password, please call the hospital operator.    09/10/2020, 9:34 PM

## 2020-09-10 NOTE — Progress Notes (Signed)
Subjective:  She has no specific complaints today.  Does feel constipated.  Has mild abdominal discomfort that is chronic.  Denies chest pain.  Intake/Output from previous day:  I/O last 3 completed shifts: In: 1151.5 [P.O.:240; I.V.:911.5] Out: 1250 [Urine:1250] Total I/O In: 555 [P.O.:240; Blood:315] Out: -   Blood pressure (!) 90/54, pulse 68, temperature 98.2 F (36.8 C), temperature source Oral, resp. rate 14, height 5' 2"  (1.575 m), weight 43 kg, SpO2 100 %. Physical Exam Constitutional:      Appearance: She is ill-appearing.     Comments: Frail  Neck:     Vascular: No carotid bruit or JVD.  Cardiovascular:     Rate and Rhythm: Normal rate and regular rhythm.     Pulses: Intact distal pulses.     Heart sounds: Normal heart sounds. No murmur heard.   No gallop.  Pulmonary:     Effort: Pulmonary effort is normal.     Breath sounds: Normal breath sounds.  Abdominal:     General: Bowel sounds are normal.     Palpations: Abdomen is soft.  Musculoskeletal:        General: No swelling.    Lab Results: BMP BNP (last 3 results) Recent Labs    09/03/20 1035  BNP 1,371.8*    ProBNP (last 3 results) No results for input(s): PROBNP in the last 8760 hours. BMP Latest Ref Rng & Units 09/10/2020 09/09/2020 09/08/2020  Glucose 70 - 99 mg/dL 290(H) 386(H) 173(H)  BUN 8 - 23 mg/dL 62(H) 52(H) 44(H)  Creatinine 0.44 - 1.00 mg/dL 1.88(H) 2.14(H) 1.94(H)  Sodium 135 - 145 mmol/L 137 133(L) 138  Potassium 3.5 - 5.1 mmol/L 5.0 4.6 4.0  Chloride 98 - 111 mmol/L 108 104 107  CO2 22 - 32 mmol/L 19(L) 21(L) 23  Calcium 8.9 - 10.3 mg/dL 8.9 8.5(L) 8.9   Hepatic Function Latest Ref Rng & Units 09/06/2020 09/03/2020 08/20/2020  Total Protein 6.5 - 8.1 g/dL 5.1(L) 6.6 6.7  Albumin 3.5 - 5.0 g/dL 2.4(L) 3.2(L) 3.7  AST 15 - 41 U/L 109(H) 86(H) 36  ALT 0 - 44 U/L 51(H) 55(H) 38  Alk Phosphatase 38 - 126 U/L 124 143(H) 176(H)  Total Bilirubin 0.3 - 1.2 mg/dL 0.4 0.8 1.4(H)  Bilirubin,  Direct 0.0 - 0.2 mg/dL <0.1 0.1 -   CBC Latest Ref Rng & Units 09/10/2020 09/09/2020 09/08/2020  WBC 4.0 - 10.5 K/uL 9.9 6.6 7.8  Hemoglobin 12.0 - 15.0 g/dL 6.9(LL) 7.1(L) 7.9(L)  Hematocrit 36.0 - 46.0 % 21.7(L) 22.5(L) 25.0(L)  Platelets 150 - 400 K/uL 244 227 233   Lipid Panel     Component Value Date/Time   CHOL 267 (H) 08/21/2020 0432   TRIG 243 (H) 08/21/2020 0432   HDL 65 08/21/2020 0432   CHOLHDL 4.1 08/21/2020 0432   VLDL 49 (H) 08/21/2020 0432   LDLCALC 153 (H) 08/21/2020 0432   Cardiac Panel (last 3 results) No results for input(s): CKTOTAL, CKMB, TROPONINI, RELINDX in the last 72 hours.  HEMOGLOBIN A1C Lab Results  Component Value Date   HGBA1C 15.4 (H) 08/20/2020   MPG 395 08/20/2020   TSH Recent Labs    08/01/20 0126 09/03/20 1320  TSH 1.432 1.029   Imaging: Imaging results have been reviewed  Cardiac Studies:   Echocardiogram 09/06/2020:  1. Left ventricular ejection fraction, by estimation, is 55 to 60%. The  left ventricle has normal function. The left ventricle has no regional  wall motion abnormalities. There is mild left ventricular  hypertrophy.  Left ventricular diastolic parameters  are consistent with Grade I diastolic dysfunction (impaired relaxation).   2. Right ventricular systolic function is normal. The right ventricular  size is normal. There is normal pulmonary artery systolic pressure.   3. Left atrial size was mildly dilated.   4. Possible pericardial cyst adjacent to basal inferolateral left  ventricle.   5. No significant valvular abnormality. Normal right atrial pressure.   6. No significant change compared to previous study on 07/31/2020.    Coronary angiography 09/05/2020 [facility]):   Mid RCA lesion is 99% stenosed.   Prox LAD to Mid LAD lesion is 80% stenosed. (Pesonally reviewed. Appears 80%)   Mid Cx lesion is 40% stenosed.  Coronary angioplasty to right coronary artery on 09/09/2020: Unsuccessful attempt at PCI to the right  coronary artery with attendant complication including extensive dissection involving the entire right coronary artery.  As there was still TIMI I-I 0.5 flow, to prevent retrograde dissection of the ostial RCA into the aorta and also to prevent further antegrade dissection or complete closure of the vessel, a 3.5 x 15 millimeters resolute frontier DES was deployed.  Overall extensive dissection is evident involving the proximal and all the way to the distal RCA with no chance of successful angioplasty.  No devices could not cross after the initial angioplasty at the proximal right coronary artery segment.  1.5 mm balloon could not also cross.  This is in spite of GuideLiner support right from the get go.  Could not pass a heavier wire as well, after initial angioplasty I had attempted to place Iron Man guidewire but I was unable to cross the stenosis probably related to significant calcium and dissection at the focal site.  30 mL of contrast was utilized.  Patient will be admitted to the ICU, I have updated patient's daughter regarding the complication.  EKG: EKG 09/04/2020: Sinus rhythm Possible old anteroseptal infarct Inferolateral ST depression  09/10/2020: Mobitz 1 AV block @ 53/min. ST-T abnormality, with T inversion consider anterior ischemia. Compared to prior EKG, heart block new.    Scheduled Meds:  aspirin EC  81 mg Oral Daily   atorvastatin  40 mg Oral Daily   atropine  0.4 mg Intravenous Once   brexpiprazole  1 mg Oral Daily   Chlorhexidine Gluconate Cloth  6 each Topical Daily   famotidine  20 mg Oral QHS   feeding supplement (NEPRO CARB STEADY)  237 mL Oral BID BM   gabapentin  300 mg Oral BID   heparin injection (subcutaneous)  5,000 Units Subcutaneous Q8H   insulin aspart  0-5 Units Subcutaneous QHS   insulin aspart  0-9 Units Subcutaneous TID WC   insulin aspart  3 Units Subcutaneous TID WC   insulin glargine-yfgn  16 Units Subcutaneous Daily   isosorbide mononitrate  30 mg  Oral Daily   mirtazapine  15 mg Oral QHS   nicotine  21 mg Transdermal Daily   pantoprazole  40 mg Oral BID   sodium chloride flush  3 mL Intravenous Q12H   ticagrelor  90 mg Oral BID   Continuous Infusions:  sodium chloride 50 mL/hr at 09/09/20 0253   sodium chloride     sodium chloride     DOPamine 5 mcg/kg/min (09/10/20 0609)   PRN Meds:.sodium chloride, acetaminophen, albuterol, fentaNYL (SUBLIMAZE) injection, metoCLOPramide, nitroGLYCERIN, ondansetron (ZOFRAN) IV, oxyCODONE, polyethylene glycol, sodium chloride flush  Assessment/Plan:  Mobitz 1 AV Block. Hypotension due to Mobitz 1 AV Block and probably RV  involvement from RCA occlusion and also due to severe chronic anemia. Chronic stage 3 b CKD. S. Cr. Stable. Chronic anemia, macrocytic.  2 uncontrolled with hyperglycemia and chronic stage 3 b CKD. CAD with NSTEMI Constipation chronic and chronic abdominal discomform.  Rec: I have been following along through the evening and night and also earlier this morning, patient's hypotension is stabilizing, while patient awake blood pressure improved.  Dopamine has been reduced to 5 mcg/kg/min.  Echocardiogram bedside performed today reveals surprisingly normal biventricular systolic function with no significant valvular abnormality and no pericardial effusion.  With regard to heart block, I suspect it is related to RCA occlusion, this should resolve in time, no indication for pacemaker.  Patient is receiving 1 unit of packed RBCs.  We will perform anemia panel.  We will discontinue antihypertensives including amlodipine and hydralazine and coreg for now.   With regard to abdominal discomfort, no clinical suspicion for retroperitoneal hematoma, she has had multiple CT scans of the abdomen in the past for abdominal discomfort.  No history of GI bleed or gastric ulcers.  We will use MiraLAX and also Dulcolax suppository today as she has not had a bowel movement in the last 3 days.  I  discussed her situation with patient's daughter again today.  All questions answered.  I spent a total of 40 minutes with the patient in review of her charts, labs, making life altering management of hypertension and heart block.  Right groin site has healed well without any hematoma or ecchymosis.  She did have mild oozing through the night.    Adrian Prows, MD, [facility] 09/10/2020, 1:07 PM Office: [phone] Fax: [phone] Pager: [phone]

## 2020-09-11 ENCOUNTER — Inpatient Hospital Stay (HOSPITAL_COMMUNITY): Payer: Medicare Other

## 2020-09-11 ENCOUNTER — Other Ambulatory Visit: Payer: Self-pay

## 2020-09-11 DIAGNOSIS — K642 Third degree hemorrhoids: Secondary | ICD-10-CM

## 2020-09-11 DIAGNOSIS — D638 Anemia in other chronic diseases classified elsewhere: Secondary | ICD-10-CM | POA: Diagnosis present

## 2020-09-11 LAB — CBC
HCT: 20.3 % — ABNORMAL LOW (ref 36.0–46.0)
Hemoglobin: 6.7 g/dL — CL (ref 12.0–15.0)
MCH: 32.5 pg (ref 26.0–34.0)
MCHC: 33 g/dL (ref 30.0–36.0)
MCV: 98.5 fL (ref 80.0–100.0)
Platelets: 198 10*3/uL (ref 150–400)
RBC: 2.06 MIL/uL — ABNORMAL LOW (ref 3.87–5.11)
RDW: 17.6 % — ABNORMAL HIGH (ref 11.5–15.5)
WBC: 8.9 10*3/uL (ref 4.0–10.5)
nRBC: 0.3 % — ABNORMAL HIGH (ref 0.0–0.2)

## 2020-09-11 LAB — BASIC METABOLIC PANEL
Anion gap: 7 (ref 5–15)
BUN: 84 mg/dL — ABNORMAL HIGH (ref 8–23)
CO2: 22 mmol/L (ref 22–32)
Calcium: 8.6 mg/dL — ABNORMAL LOW (ref 8.9–10.3)
Chloride: 107 mmol/L (ref 98–111)
Creatinine, Ser: 2.84 mg/dL — ABNORMAL HIGH (ref 0.44–1.00)
GFR, Estimated: 18 mL/min — ABNORMAL LOW (ref >60)
Glucose, Bld: 209 mg/dL — ABNORMAL HIGH (ref 70–99)
Potassium: 4.9 mmol/L (ref 3.5–5.1)
Sodium: 136 mmol/L (ref 135–145)

## 2020-09-11 LAB — GLUCOSE, CAPILLARY
Glucose-Capillary: 291 mg/dL — ABNORMAL HIGH (ref 70–99)
Glucose-Capillary: 291 mg/dL — ABNORMAL HIGH (ref 70–99)
Glucose-Capillary: 93 mg/dL (ref 70–99)
Glucose-Capillary: 65 mg/dL — ABNORMAL LOW (ref 70–99)
Glucose-Capillary: 100 mg/dL — ABNORMAL HIGH (ref 70–99)
Glucose-Capillary: 66 mg/dL — ABNORMAL LOW (ref 70–99)

## 2020-09-11 LAB — LACTIC ACID, PLASMA: Lactic Acid, Venous: 1.3 mmol/L (ref 0.5–1.9)

## 2020-09-11 MED ORDER — FAMOTIDINE 20 MG PO TABS
10.0000 mg | ORAL_TABLET | Freq: Every day | ORAL | Status: DC
  Administered 2020-09-11 – 2020-09-17 (×7): 10 mg via ORAL
  Filled 2020-09-11 (×7): qty 1

## 2020-09-11 MED ORDER — GABAPENTIN 600 MG PO TABS
300.0000 mg | ORAL_TABLET | Freq: Every day | ORAL | Status: DC
  Administered 2020-09-11 – 2020-09-17 (×7): 300 mg via ORAL
  Filled 2020-09-11 (×7): qty 1

## 2020-09-11 MED ORDER — PSYLLIUM 95 % PO PACK
1.0000 | PACK | Freq: Every day | ORAL | Status: DC
  Administered 2020-09-11 – 2020-09-17 (×7): 1 via ORAL
  Filled 2020-09-11 (×8): qty 1

## 2020-09-11 MED ORDER — INSULIN GLARGINE-YFGN 100 UNIT/ML ~~LOC~~ SOLN
20.0000 [IU] | Freq: Every day | SUBCUTANEOUS | Status: DC
  Filled 2020-09-11: qty 0.2

## 2020-09-11 MED ORDER — WITCH HAZEL-GLYCERIN EX PADS
MEDICATED_PAD | CUTANEOUS | Status: DC | PRN
  Filled 2020-09-11: qty 100

## 2020-09-11 MED ORDER — DEXTROSE 50 % IV SOLN
25.0000 mL | Freq: Once | INTRAVENOUS | Status: DC
  Filled 2020-09-11: qty 50

## 2020-09-11 MED ORDER — HYDROCORTISONE ACETATE 25 MG RE SUPP
25.0000 mg | Freq: Two times a day (BID) | RECTAL | Status: DC
  Administered 2020-09-12 – 2020-09-14 (×5): 25 mg via RECTAL
  Filled 2020-09-11 (×17): qty 1

## 2020-09-11 MED ORDER — MORPHINE SULFATE (PF) 2 MG/ML IV SOLN
1.0000 mg | INTRAVENOUS | Status: DC | PRN
  Administered 2020-09-11 – 2020-09-17 (×29): 2 mg via INTRAVENOUS
  Administered 2020-09-18: 1 mg via INTRAVENOUS
  Administered 2020-09-18: 2 mg via INTRAVENOUS
  Filled 2020-09-11 (×34): qty 1

## 2020-09-11 MED ORDER — INSULIN GLARGINE-YFGN 100 UNIT/ML ~~LOC~~ SOLN
18.0000 [IU] | Freq: Every day | SUBCUTANEOUS | Status: DC
  Administered 2020-09-11: 18 [IU] via SUBCUTANEOUS
  Filled 2020-09-11: qty 0.18

## 2020-09-11 NOTE — Progress Notes (Signed)
Dr. Erlinda Hong notified of concern for low hgb. After she consulted with GI [facility] Cards a decision to not transfuse PRBCs today was made due to bleeding hemorrhoids.

## 2020-09-11 NOTE — Consult Note (Addendum)
[facility] Consult: 2:18 PM 09/11/2020  LOS: 6 days    Referring Provider: Dr Florencia Reasons  Primary Care Physician:  [name], NP in [facility] Primary Gastroenterologist:  unassigned.  Earnie Larsson, MD at [facility].    Reason for Consultation: Lower GI bleeding.   HPI: Erica Mercer is a 65 y.o. female.  PMH CAD. MI.  IDDM, but is mention poorly controlled.  DKA.  HTN.  HLD.  Bipolar disorder, depression.  Marijuana abuse.  Nausea, vomiting.  CKD 3.  AKI.  COPD.  Tobacco use. 02/2018 Colonoscopy: Nonbleeding internal hemorrhoids.  7 colon polyps removed, pathology with nondysplastic tubular adenoma.. 02/2018 EGD: Acanthosiis of esophagus, pathology without reflux or eosinophilic esophagitis.  Antral erythema, pathology: gastric antral oxyntic mucosa without pathology.  No gastritis, no H. pylori,   Admission 1 week ago.  Came from [facility] following cardiac cath which demonstrated significant RCA stenosis.  Unable to perform balloon angioplasty.  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  As preventative to control dissection, a DAS was deployed.  Developed Mobitz 1 AV block.  Supported with dopamine yesterday, now discontinued. Bivalirudin on 8/12.  Heparin drip discontinued but continues on subcu heparin.  Day 7 Brilinta.  Daily 81 aspirin Hgb at admission 9.7 (9.9 on 7/30).  Drifted down over the next few days and 6.9 yesterday, received 1 PRBC.  Hgb early this morning 6.7.  No additional PRBCs ordered.  Chronic rectal bleeding w constipation.  Worsening hematochezia in last 3 d.  Large BM's yesterday resolved abd pain.  She describes previous bleeding prior to admission of bleeding with and without bowel movements, there was moderate volume bleeding into  her pull-ups at worst.  With this recent bleeding she is needing to use a towel to absorb the blood and sees extensive blood in the commode.  Feels dizzy with standing.  No nausea or vomiting.  Good appetite.  Takes Protonix twice a day at home.  Family history of diabetes, kidney disease in her mother.  Prostate cancer in her father.    Past Medical History:  Diagnosis Date   Bipolar 1 disorder ([place])    CAD (coronary artery disease)    Diabetes mellitus without complication ([place])    HLD (hyperlipidemia)    HTN (hypertension)     Past Surgical History:  Procedure Laterality Date   COLONOSCOPY     CORONARY BALLOON ANGIOPLASTY N/A 09/05/2020   Procedure: CORONARY BALLOON ANGIOPLASTY;  Surgeon: Yolonda Kida, MD;  Location: [facility];  Service: Cardiovascular;  Laterality: N/A;  RCA   CORONARY STENT INTERVENTION N/A 09/09/2020   Procedure: CORONARY STENT INTERVENTION;  Surgeon: Adrian Prows, MD;  Location: [place] [facility];  Service: Cardiovascular;  Laterality: N/A;   ESOPHAGOGASTRODUODENOSCOPY     INTRAVASCULAR LITHOTRIPSY  09/09/2020   Procedure: INTRAVASCULAR LITHOTRIPSY;  Surgeon: Adrian Prows, MD;  Location: [place] [facility];  Service: Cardiovascular;;   LEFT HEART CATH AND CORONARY ANGIOGRAPHY N/A 09/05/2020   Procedure: LEFT HEART CATH AND CORONARY  ANGIOGRAPHY with intervention;  Surgeon: Dionisio David, MD;  Location: [place] [facility];  Service: Cardiovascular;  Laterality: N/A;    Prior to Admission medications   Medication Sig Start Date End Date Taking? Authorizing Provider  albuterol (VENTOLIN HFA) 108 (90 Base) MCG/ACT inhaler Inhale 2 puffs into the lungs every 6 (six) hours as needed for wheezing. 05/07/20 05/02/21  Loletha Grayer, MD  amLODipine (NORVASC) 10 MG tablet Take 10 mg by mouth daily.    [provider]  aspirin EC 81 MG EC tablet Take 1 tablet (81 mg total) by mouth daily. Swallow whole. 08/04/20   Loletha Grayer, MD  atorvastatin  (LIPITOR) 40 MG tablet Take 40 mg by mouth daily.    [provider]  brexpiprazole (REXULTI) 1 MG TABS tablet Take 1 tablet (1 mg total) by mouth daily. 09/06/20   Terrilee Croak, MD  carvedilol (COREG) 25 MG tablet Take 1 tablet (25 mg total) by mouth 2 (two) times daily with a meal. 08/23/20   Sharen Hones, MD  famotidine (PEPCID) 20 MG tablet Take 1 tablet (20 mg total) by mouth at bedtime. 08/03/20   Loletha Grayer, MD  heparin 25000 UT/250ML infusion Inject 700 Units/hr into the vein continuous. 09/05/20   Terrilee Croak, MD  hydrALAZINE (APRESOLINE) 25 MG tablet Take 1 tablet (25 mg total) by mouth every 8 (eight) hours. 08/23/20   Sharen Hones, MD  insulin glargine-yfgn (SEMGLEE) 100 UNIT/ML injection Inject 0.18 mLs (18 Units total) into the skin at bedtime. 09/05/20   Terrilee Croak, MD  insulin lispro (HUMALOG) 100 UNIT/ML KwikPen Inject 3 Units into the skin 3 (three) times daily before meals. 08/23/20   Sharen Hones, MD  Insulin Pen Needle 32G X 4 MM MISC 1 Dose by Does not apply route 4 (four) times daily -  before meals and at bedtime. 08/03/20   Loletha Grayer, MD  metoCLOPramide (REGLAN) 10 MG tablet Take 1 tablet (10 mg total) by mouth every 6 (six) hours as needed. 06/16/20   Carrie Mew, MD  mirtazapine (REMERON) 15 MG tablet Take 15 mg by mouth at bedtime. 08/14/20   [provider]  Nutritional Supplements (FEEDING SUPPLEMENT, NEPRO CARB STEADY,) LIQD Take 237 mLs by mouth 2 (two) times daily between meals. 09/05/20   Terrilee Croak, MD  pantoprazole (PROTONIX) 40 MG tablet Take 1 tablet (40 mg total) by mouth 2 (two) times daily. 08/03/20   Loletha Grayer, MD    Scheduled Meds:  (feeding supplement) PROSource Plus  30 mL Oral BID BM   aspirin EC  81 mg Oral Daily   atorvastatin  40 mg Oral Daily   brexpiprazole  1 mg Oral Daily   Chlorhexidine Gluconate Cloth  6 each Topical Daily   famotidine  10 mg Oral QHS   feeding supplement (GLUCERNA SHAKE)  237 mL  Oral BID BM   gabapentin  300 mg Oral QHS   heparin injection (subcutaneous)  5,000 Units Subcutaneous Q8H   insulin aspart  0-5 Units Subcutaneous QHS   insulin aspart  0-9 Units Subcutaneous TID WC   insulin aspart  4 Units Subcutaneous TID WC   insulin glargine-yfgn  18 Units Subcutaneous Daily   isosorbide mononitrate  30 mg Oral Daily   mirtazapine  15 mg Oral QHS   multivitamin with minerals  1 tablet Oral Daily   nicotine  21 mg Transdermal Daily   pantoprazole  40 mg Oral BID   senna-docusate  1 tablet Oral BID  sodium chloride flush  3 mL Intravenous Q12H   ticagrelor  90 mg Oral BID   Infusions:  sodium chloride 50 mL/hr at 09/09/20 1240   sodium chloride     PRN Meds: sodium chloride, acetaminophen, albuterol, fentaNYL (SUBLIMAZE) injection, metoCLOPramide, nitroGLYCERIN, ondansetron (ZOFRAN) IV, oxyCODONE, polyethylene glycol, sodium chloride flush   Allergies as of 09/05/2020   (No Known Allergies)    Family History  Problem Relation Age of Onset   Heart disease Mother    Prostate cancer Father     Social History   Socioeconomic History   Marital status: Single    Spouse name: Not on file   Number of children: Not on file   Years of education: Not on file   Highest education level: Not on file  Occupational History   Not on file  Tobacco Use   Smoking status: Every Day   Smokeless tobacco: Never  Vaping Use   Vaping Use: Never used  Substance and Sexual Activity   Alcohol use: Not Currently   Drug use: Yes    Frequency: 3.0 times per week    Types: Marijuana   Sexual activity: Not Currently  Other Topics Concern   Not on file  Social History Narrative   Not on file   Social Determinants of Health   Financial Resource Strain: Not on file  Food Insecurity: Not on file  Transportation Needs: Not on file  Physical Activity: Not on file  Stress: Not on file  Social Connections: Not on file  Intimate Partner Violence: Not on file     REVIEW OF SYSTEMS: Constitutional: Dizziness, weakness today with standing but okay at rest. ENT:  No nose bleeds Pulm: No shortness of breath or cough. CV:  No palpitations, no LE edema.  Current angina. GU:  No hematuria, no frequency GI: See HPI. Heme: Other than the hemorrhoidal bleeding, she denies issues with unusual or excessive bleeding. Transfusions: See HPI.  Had never received blood transfusion prior to yesterday. Neuro:  No headaches, no peripheral tingling or numbness.  Dizziness which is new with this bleeding. Derm:  No itching, no rash or sores.  Endocrine:  No sweats or chills.  No polyuria or dysuria Immunization: Reviewed. Travel:  None beyond local counties in last few months.    PHYSICAL EXAM: Vital signs in last 24 hours: Vitals:   09/11/20 1135 09/11/20 1200  BP: 122/79   Pulse: 84   Resp: (!) 27   Temp:  99 F (37.2 C)  SpO2: 93%    Wt Readings from Last 3 Encounters:  09/05/20 43 kg  09/05/20 43 kg  08/19/20 44 kg    General: Animated, talkative, looks somewhat chronically ill but not acutely ill.  Sitting up comfortably in bed. Head: No facial asymmetry or swelling.  No signs of head trauma. Eyes: Conjunctiva is pale. Ears: Not hard of hearing Nose: No congestion or discharge. Mouth: No teeth.  Mucosa is moist, pink, clear.  Tongue midline. Neck: No JVD. Lungs: No labored breathing or cough.  Heart: RRR.  No MRG.  S1, S2 present. Abdomen: Soft without tenderness.  No distention.  Active bowel sounds.  No HSM, masses, bruits, hernias..   Rectal: Large external and palpable large internal hemorrhoids.  These are not thrombosed.  Do not see bleeding or ulcers on the hemorrhoids.  Exam finger with streaks of red blood. Musc/Skeltl: No joint redness, swelling or gross deformities. Extremities: Slight lower extremity pitting edema. Neurologic: Alert and oriented x3.  Moves all 4 limbs without tremor.  Strength not tested.  No gross  deficits. Skin: No suspicious rashes or sores. Psych: Pleasant, animated.  Somewhat pressured speech but appropriate.  Good historian.  Intake/Output from previous day: 08/17 0701 - 09/19/2022 0700 In: 1547.2 [P.O.:1200; I.V.:32.2; Blood:315] Out: 1125 [Urine:1125] Intake/Output this shift: No intake/output data recorded.  LAB RESULTS: Recent Labs    09/09/20 0247 09/10/20 0209 September 18, 2020 0117  WBC 6.6 9.9 8.9  HGB 7.1* 6.9* 6.7*  HCT 22.5* 21.7* 20.3*  PLT 227 244 198   BMET Lab Results  Component Value Date   NA 136 09-18-2020   NA 137 09/10/2020   NA 133 (L) 09/09/2020   K 4.9 18-Sep-2020   K 5.0 09/10/2020   K 4.6 09/09/2020   CL 107 2020-09-18   CL 108 09/10/2020   CL 104 09/09/2020   CO2 22 09/18/20   CO2 19 (L) 09/10/2020   CO2 21 (L) 09/09/2020   GLUCOSE 209 (H) 09/18/2020   GLUCOSE 290 (H) 09/10/2020   GLUCOSE 386 (H) 09/09/2020   BUN 84 (H) 09-18-20   BUN 62 (H) 09/10/2020   BUN 52 (H) 09/09/2020   CREATININE 2.84 (H) 18-Sep-2020   CREATININE 1.88 (H) 09/10/2020   CREATININE 2.14 (H) 09/09/2020   CALCIUM 8.6 (L) 09/18/2020   CALCIUM 8.9 09/10/2020   CALCIUM 8.5 (L) 09/09/2020   LFT No results for input(s): PROT, ALBUMIN, AST, ALT, ALKPHOS, BILITOT, BILIDIR, IBILI in the last 72 hours. PT/INR Lab Results  Component Value Date   INR 1.0 09/04/2020   INR 1.0 09/03/2020   INR 1.1 07/30/2020   Hepatitis Panel No results for input(s): HEPBSAG, HCVAB, HEPAIGM, HEPBIGM in the last 72 hours. C-Diff No components found for: CDIFF Lipase     Component Value Date/Time   LIPASE 30 08/20/2020 0002    Drugs of Abuse     Component Value Date/Time   LABOPIA NONE DETECTED 09/03/2020 1415   COCAINSCRNUR NONE DETECTED 09/03/2020 1415   LABBENZ NONE DETECTED 09/03/2020 1415   AMPHETMU NONE DETECTED 09/03/2020 1415   THCU POSITIVE (A) 09/03/2020 1415   LABBARB NONE DETECTED 09/03/2020 1415     RADIOLOGY STUDIES: US RENAL  Result Date:  2020-09-18 CLINICAL DATA:  Elevated serum creatinine.  Diabetes.  Hypertension EXAM: RENAL / URINARY TRACT ULTRASOUND COMPLETE COMPARISON:  Ultrasound 08/22/2020, CT 08/20/2020 FINDINGS: Right Kidney: Renal measurements: 8.5 x 3.3 x 5.0 cm = volume: 74 mL. Mildly increased renal cortical echogenicity. No mass or hydronephrosis visualized. Left Kidney: Renal measurements: 11.0 x 5.8 x 5.1 cm = volume: 192 mL. Echogenicity within normal limits. No mass or hydronephrosis visualized. Bladder: Appears normal for degree of bladder distention. Other: Trace perihepatic ascites. Right-sided pleural effusion is partially visualized. IMPRESSION: 1. No evidence of obstructive uropathy. 2. Slightly asymmetrically atrophic right kidney. Mildly increased right renal cortical echogenicity suggesting medical renal disease. 3. Unremarkable sonographic appearance of the left kidney. Electronically Signed   By: Davina Poke D.O.   On: 09/18/2020 12:02     IMPRESSION:      Hemorrhoidal bleeding.  Worsened in setting of blood thinner.  Bivalirudin on 8/12.  Heparin drip discontinued but continues on subcu heparin.  Day 7 Brilinta.  Low-dose aspirin daily.  02/2018 colonoscopy with nonbleeding hemorrhoids.  Multiple tubular adenomatous polyps.  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  On chronic Protonix 40 mg bid and Pepcid at HS.    CAD.  RCA occlusion.  Transferred to [facility] where she underwent  repeat cardiac cath with unsuccessful PCI and complications of dissecting RCA.  A stent was placed to prevent extension of dissection.  Developed Mobitz 1 AV block.   Off dopamine.  Bivalirudin on 8/12.  Heparin drip discontinued but continues on subcu heparin.  Day 7 Brilinta.  ASA 81 mg daily.  Echo by EF 8/13 showed LVEF 55 to 60% and grade 1 diastolic dysfunction, possible pericardial cyst, normal pulmonary pressures.     Blood loss anemia.  Hb 9.7 on admission near her baseline from 2 weeks prior.  Hgb drifted to 6.9  yesterday and she received a unit of blood but this morning's Hb 6.7.  No additional PRBCs ordered.    PLAN:       Annusol HC PR bid.  Metamucil daily.  Witch hazel wipes.  Senna twice daily is already in place.    ?give additional 1 U PRBC? Messaged Dr Erlinda Hong w this suggestion.  She said the cardiologist, Dr. Einar Gip, did not recommend additional transfusion.   Azucena Freed  09/11/2020, 2:18 PM Phone [phone]   Attending Physician's Attestation   I have taken an interval history, reviewed the chart and examined the patient.   65 y/o fm with obstructive CAD now with dissected coronary on antiplatelet therapy with known hemorrhoids and constipation, with worsened hemorrhoidal bleeding since receiving more aggressive anticoagulation.  Although hemorrhoidal bleeding typically does not lead to significant drops in hgb, it is the likely cause of the patient's acute on chronic anemia here.   No need for diagnostic colonoscopy as the patient's clinical is consistent with prolapsing hemorrhoids (grade III) and patient had colonoscopy in the past 2 years showing hemorrhoids and no other potential bleeding etiologies.   Although invasive interventions for hemorrhoids can be performed to prevent further bleeding (such as banding), these interventions introduce the risk of more significant bleeding from banding ulcers, which could be severe and life threatening on a patient requiring aggressive antiplatelet therapy. Therefore, I recommend we try to maximize conservative therapy by optimizing bowel habits and stool consistency and treating with topical therapies to reduce hemorrhoidal irritation.  Goal would be to produce daily soft stools without straining or constipation. Avoiding diarrhea and frequent bowel movements also important.  Fiber supplementation is generally recommended and this can be added to her senna.  Would add Miralax daily if not having a BM 1-2 days on the this regimen.  I agree with  the Advanced Practitioner's note, impression, and recommendations with updates and my documentation above.   Dustin Flock, MD [facility]

## 2020-09-11 NOTE — Progress Notes (Signed)
PROGRESS NOTE    Erica Mercer  [id number] DOB: January 27, 1955 DOA: 09/05/2020 PCP: [name], NP    No chief complaint on file.   Brief Narrative:  Erica Mercer is a 65 y.o. female with medical history significant of IDDM, HTN, HLD, chronic anemia, cigarette smoker, bipolar disorder, presented with SOB and chest pain.    Underwent left side cardiac cath at [facility]  which showed 90% RCA stenosis with severe calcification, unable to be ballooned.  I recommend patient to be transferred to [facility] for further intervention of RCA Monday.    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, she was transferred to ICU, cardiology managing  Hospitalist manage diabetes care    Subjective:  She is off dopamine drip, blood pressure stable She reports had hemorrhoid bleed Her blood glucose remain elevated   Assessment & Plan:   Principal Problem:   NSTEMI (non-ST elevated myocardial infarction) ([place]) Active Problems:   Severe protein-calorie malnutrition ([place])   Essential hypertension   HLD (hyperlipidemia)   COPD (chronic obstructive pulmonary disease) (HCC)   Bipolar 1 disorder (HCC)   Non-ST elevation (NSTEMI) myocardial infarction ([place])   AKI (acute kidney injury) ([place])   Stage 3a chronic kidney disease (HCC)   Type II diabetes mellitus with renal manifestations (HCC)   Anemia of chronic disease  Non-STEMI In the event of unsuccessful cardiac catheterization on 8/16 detail please refer to original report Currently she has no chest pain Management per cardiology  Hypotension ( developed on 8/16-8/17 night) Per cardiology hypotension likely from above is 1 AV block possibly RV involvement from RCA occlusion Anemia could also contribute Was on dopamine drip briefly, blood pressure stable off dopamine drip on 8/18 Management per cardiology    Diastolic  CHF Report present with bilateral lower extremity edema edema has much improved Close monitor volume status Cardiology managing   Acute on chronic anemia Hemoglobin at baseline appear to be between 8 and 9 -Hemoglobin 6.9 this morning -Does not appear to have active bleed, no bowel movement for several days -Received PRBC transfusion Currently on asa/ brilinta which cardiology prefer to continue  -had lower gi bleed , likely hemorrhoid bleed, cardiology recommend GI consult, GI notified   AKI on CKDIV Cr increased, likely from contrast and hypotension Renal US no obstruction,  Ua ordered, messaged RN to collect urine Avoid nephrotoxin, avoid hypotension Repeat bmp in am, renal dosing meds   Insulin-dependent type 2 diabetes, uncontrolled, with hyperglycemia A1c 15.4 Appear has been in the hospital multiple times for DKA this year Need diabetes education Continue adjust insulin, increase meal coverage, increase long-acting insulin tomorrow,   Constipation Start stool softener  Nutritional Assessment: The patient's BMI is: Body mass index is 17.34 kg/m.Marland Kitchen  Seen by dietician.  I agree with the assessment and plan as outlined below:  Nutrition Status: Nutrition Problem: Severe Malnutrition Etiology: chronic illness Signs/Symptoms: severe fat depletion, severe muscle depletion Interventions: Refer to RD note for recommendations  .    Unresulted Labs (From admission, onward)     Start     Ordered   09/11/20 0822  Urinalysis, Routine w reflex microscopic  Once,   R        09/11/20 0821   09/10/20 1733  MRSA Next Gen by PCR, Nasal  Once,   R        09/10/20 1732   09/08/20 [id number]  Basic metabolic panel  Daily,   R     Question:  Specimen collection method  Answer:  Lab=Lab collect   09/07/20 1046   Unscheduled  Occult blood card to lab, stool  As needed,   R      09/05/20 1915   Unscheduled  Occult blood card to lab, stool  As needed,   R      09/11/20 0818               DVT prophylaxis: heparin injection 5,000 Units Start: 09/10/20 1400   Code Status: Full Family Communication: Daughter at bedside on 8/17 Disposition:   Status is: Inpatient   Dispo: The patient is from: Home              Anticipated d/c is to: To be determined              Anticipated d/c date is: Currently not medical stable to discharge                Consultants:  Cardiology GI  Procedures:  Cardiac catheterization  Antimicrobials:   None     Objective: Vitals:   09/11/20 0530 09/11/20 0600 09/11/20 0630 09/11/20 0700  BP: 118/73 114/69 111/68 126/77  Pulse: 77 80 80 82  Resp: 14 18 (!) 21 20  Temp:      TempSrc:      SpO2: 97% 95% 95% 96%  Weight:      Height:        Intake/Output Summary (Last 24 hours) at 09/11/2020 0826 Last data filed at 09/11/2020 0600 Gross per 24 hour  Intake 1537.71 ml  Output 1125 ml  Net 412.71 ml   Filed Weights   09/05/20 1947  Weight: 43 kg    Examination:  General exam: calm, NAD Respiratory system: Clear to auscultation. Respiratory effort normal. Cardiovascular system: S1 & S2 heard, RRR. No JVD, no murmur, No pedal edema. Gastrointestinal system: Abdomen is nondistended, soft and nontender. Normal bowel sounds heard. Central nervous system: Alert and oriented. No focal neurological deficits. Extremities: Trace bilateral lower extremity edema, right greater than left, report has much improved compared to before Skin: No rashes, lesions or ulcers Psychiatry: Judgement and insight appear normal. Mood & affect appropriate.     Data Reviewed: I have personally reviewed following labs and imaging studies  CBC: Recent Labs  Lab 09/05/20 0453 09/06/20 0531 09/07/20 0230 09/08/20 0222 09/09/20 0247 09/10/20 0209 09/11/20 0117  WBC 9.7   < > 8.0 7.8 6.6 9.9 8.9  NEUTROABS 6.0  --   --   --   --   --   --   HGB 8.2*   < > 7.5* 7.9* 7.1* 6.9* 6.7*  HCT 24.9*   < > 23.7* 25.0* 22.5* 21.7* 20.3*   MCV 101.6*   < > 102.6* 103.7* 104.7* 104.8* 98.5  PLT 279   < > 237 233 227 244 198   < > = values in this interval not displayed.    Basic Metabolic Panel: Recent Labs  Lab 09/08/20 0222 09/08/20 1232 09/09/20 0247 09/10/20 0209 09/11/20 0117  NA 137 138 133* 137 136  K 4.5 4.0 4.6 5.0 4.9  CL 106 107 104 108 107  CO2 23 23 21* 19* 22  GLUCOSE 172* 173* 386* 290* 209*  BUN 42* 44* 52* 62* 84*  CREATININE 1.93* 1.94* 2.14* 1.88* 2.84*  CALCIUM 8.9 8.9 8.5* 8.9 8.6*    GFR: Estimated Creatinine Clearance: 13.4 mL/min (A) (by  C-G formula based on SCr of 2.84 mg/dL (H)).  Liver Function Tests: Recent Labs  Lab 09/06/20 0530  AST 109*  ALT 51*  ALKPHOS 124  BILITOT 0.4  PROT 5.1*  ALBUMIN 2.4*    CBG: Recent Labs  Lab 09/10/20 1121 09/10/20 1414 09/10/20 1543 09/10/20 2119 09/11/20 0634  GLUCAP 385* 285* 305* 313* 291*     Recent Results (from the past 240 hour(s))  SARS CORONAVIRUS 2 (TAT 6-24 HRS) Nasopharyngeal Nasopharyngeal Swab     Status: None   Collection Time: 09/03/20  2:15 PM   Specimen: Nasopharyngeal Swab  Result Value Ref Range Status   SARS Coronavirus 2 NEGATIVE NEGATIVE Final    Comment: (NOTE) SARS-CoV-2 target nucleic acids are NOT DETECTED.  The SARS-CoV-2 RNA is generally detectable in upper and lower respiratory specimens during the acute phase of infection. Negative results do not preclude SARS-CoV-2 infection, do not rule out co-infections with other pathogens, and should not be used as the sole basis for treatment or other patient management decisions. Negative results must be combined with clinical observations, patient history, and epidemiological information. The expected result is Negative.  Fact Sheet for Patients: SugarRoll.be  Fact Sheet for Healthcare Providers: https://www.woods-mathews.com/  This test is not yet approved or cleared by the [place] FDA and  has been  authorized for detection and/or diagnosis of SARS-CoV-2 by FDA under an Emergency Use Authorization (EUA). This EUA will remain  in effect (meaning this test can be used) for the duration of the COVID-19 declaration under Se ction 564(b)(1) of the Act, 21 U.S.C. section 360bbb-3(b)(1), unless the authorization is terminated or revoked sooner.  Performed at [facility], [place] [address] [place], [place] [address]          Radiology Studies: CARDIAC CATHETERIZATION  Result Date: 09/09/2020 Coronary angioplasty to right coronary artery on 09/09/2020: Unsuccessful attempt at PCI to the right coronary artery with attendant complication including extensive dissection involving the entire right coronary artery.  As there was still TIMI I-I 0.5 flow, to prevent retrograde dissection of the ostial RCA into the aorta and also to prevent further antegrade dissection or complete closure of the vessel, a 3.5 x 15 millimeters resolute frontier DES was deployed. Overall extensive dissection is evident involving the proximal and all the way to the distal RCA with no chance of successful angioplasty.  No devices could not cross after the initial angioplasty at the proximal right coronary artery segment.  1.5 mm balloon could not also cross.  This is in spite of GuideLiner support right from the get go.  Could not pass a heavier wire as well, after initial angioplasty I had attempted to place Iron Man guidewire but I was unable to cross the stenosis probably related to significant calcium and dissection at the focal site. 30 mL of contrast was utilized.  Patient will be admitted to the ICU, I have updated patient's daughter regarding the complication.   PERIPHERAL VASCULAR CATHETERIZATION  Result Date: 09/09/2020 Coronary angioplasty to right coronary artery on 09/09/2020: Unsuccessful attempt at PCI to the right coronary artery with attendant complication including extensive dissection involving the entire  right coronary artery.  As there was still TIMI I-I 0.5 flow, to prevent retrograde dissection of the ostial RCA into the aorta and also to prevent further antegrade dissection or complete closure of the vessel, a 3.5 x 15 millimeters resolute frontier DES was deployed. Overall extensive dissection is evident involving the proximal and all the way  to the distal RCA with no chance of successful angioplasty.  No devices could not cross after the initial angioplasty at the proximal right coronary artery segment.  1.5 mm balloon could not also cross.  This is in spite of GuideLiner support right from the get go.  Could not pass a heavier wire as well, after initial angioplasty I had attempted to place Iron Man guidewire but I was unable to cross the stenosis probably related to significant calcium and dissection at the focal site. 30 mL of contrast was utilized.  Patient will be admitted to the ICU, I have updated patient's daughter regarding the complication.        Scheduled Meds:  (feeding supplement) PROSource Plus  30 mL Oral BID BM   aspirin EC  81 mg Oral Daily   atorvastatin  40 mg Oral Daily   brexpiprazole  1 mg Oral Daily   Chlorhexidine Gluconate Cloth  6 each Topical Daily   famotidine  10 mg Oral QHS   feeding supplement (GLUCERNA SHAKE)  237 mL Oral BID BM   gabapentin  300 mg Oral QHS   heparin injection (subcutaneous)  5,000 Units Subcutaneous Q8H   insulin aspart  0-5 Units Subcutaneous QHS   insulin aspart  0-9 Units Subcutaneous TID WC   insulin aspart  4 Units Subcutaneous TID WC   insulin glargine-yfgn  18 Units Subcutaneous Daily   isosorbide mononitrate  30 mg Oral Daily   mirtazapine  15 mg Oral QHS   multivitamin with minerals  1 tablet Oral Daily   nicotine  21 mg Transdermal Daily   pantoprazole  40 mg Oral BID   senna-docusate  1 tablet Oral BID   sodium chloride flush  3 mL Intravenous Q12H   ticagrelor  90 mg Oral BID   Continuous Infusions:  sodium chloride  50 mL/hr at 09/09/20 1240   sodium chloride       LOS: 6 days   Time spent: 17mns Greater than 50% of this time was spent in counseling, explanation of diagnosis, planning of further management, and coordination of care.   Voice Recognition /Viviann Sparedictation system was used to create this note, attempts have been made to correct errors. Please contact the author with questions and/or clarifications.   FFlorencia Reasons MD PhD FACP [facility]  Available via Epic secure chat 7am-7pm for nonurgent issues Please page for urgent issues To page the attending provider between 7A-7P or the covering provider during after hours 7P-7A, please log into the web site [email or web address] and access using universal [place] password for that web site. If you do not have the password, please call the hospital operator.    09/11/2020, 8:26 AM

## 2020-09-11 NOTE — Progress Notes (Signed)
Subjective:  She has no specific complaints today.   Has mild abdominal discomfort that is chronic.  Denies chest pain.  She has been back in sinus rhythm and has not needed any pressor support.  Did respond to MiraLAX and Dulcolax suppository.  Intake/Output from previous day:  I/O last 3 completed shifts: In: 1887.3 [P.O.:1200; I.V.:372.3; Blood:315] Out: 1125 [Urine:1125] No intake/output data recorded.  Blood pressure 126/77, pulse 82, temperature 99.3 F (37.4 C), temperature source Oral, resp. rate 20, height _0  (1.575 m), weight 43 kg, SpO2 96 %. Physical Exam Constitutional:      Appearance: She is ill-appearing.     Comments: Frail  Neck:     Vascular: No carotid bruit or JVD.  Cardiovascular:     Rate and Rhythm: Normal rate and regular rhythm.     Pulses: Intact distal pulses.     Heart sounds: Normal heart sounds. No murmur heard.   No gallop.  Pulmonary:     Effort: Pulmonary effort is normal.     Breath sounds: Normal breath sounds.  Abdominal:     General: Bowel sounds are normal.     Palpations: Abdomen is soft.  Musculoskeletal:        General: No swelling.    Lab Results: BMP BNP (last 3 results) Recent Labs    09/03/20 1035  BNP 1,371.8*     ProBNP (last 3 results) No results for input(s): PROBNP in the last 8760 hours. BMP Latest Ref Rng & Units 09/11/2020 09/10/2020 09/09/2020  Glucose 70 - 99 mg/dL 209(H) 290(H) 386(H)  BUN 8 - 23 mg/dL 84(H) 62(H) 52(H)  Creatinine 0.44 - 1.00 mg/dL 2.84(H) 1.88(H) 2.14(H)  Sodium 135 - 145 mmol/L 136 137 133(L)  Potassium 3.5 - 5.1 mmol/L 4.9 5.0 4.6  Chloride 98 - 111 mmol/L 107 108 104  CO2 22 - 32 mmol/L 22 19(L) 21(L)  Calcium 8.9 - 10.3 mg/dL 8.6(L) 8.9 8.5(L)   Hepatic Function Latest Ref Rng & Units 09/06/2020 09/03/2020 08/20/2020  Total Protein 6.5 - 8.1 g/dL 5.1(L) 6.6 6.7  Albumin 3.5 - 5.0 g/dL 2.4(L) 3.2(L) 3.7  AST 15 - 41 U/L 109(H) 86(H) 36  ALT 0 - 44 U/L 51(H) 55(H) 38  Alk Phosphatase  38 - 126 U/L 124 143(H) 176(H)  Total Bilirubin 0.3 - 1.2 mg/dL 0.4 0.8 1.4(H)  Bilirubin, Direct 0.0 - 0.2 mg/dL <0.1 0.1 -   CBC Latest Ref Rng & Units 09/11/2020 09/10/2020 09/09/2020  WBC 4.0 - 10.5 K/uL 8.9 9.9 6.6  Hemoglobin 12.0 - 15.0 g/dL 6.7(LL) 6.9(LL) 7.1(L)  Hematocrit 36.0 - 46.0 % 20.3(L) 21.7(L) 22.5(L)  Platelets 150 - 400 K/uL 198 244 227   Lipid Panel     Component Value Date/Time   CHOL 267 (H) 08/21/2020 0432   TRIG 243 (H) 08/21/2020 0432   HDL 65 08/21/2020 0432   CHOLHDL 4.1 08/21/2020 0432   VLDL 49 (H) 08/21/2020 0432   LDLCALC 153 (H) 08/21/2020 0432   Cardiac Panel (last 3 results) No results for input(s): CKTOTAL, CKMB, TROPONINI, RELINDX in the last 72 hours.  HEMOGLOBIN A1C Lab Results  Component Value Date   HGBA1C 15.4 (H) 08/20/2020   MPG 395 08/20/2020   TSH Recent Labs    08/01/20 0126 09/03/20 1320  TSH 1.432 1.029    Imaging: Imaging results have been reviewed  Cardiac Studies:   Echocardiogram 09/06/2020:  1. Left ventricular ejection fraction, by estimation, is 55 to 60%. The  left ventricle has  normal function. The left ventricle has no regional  wall motion abnormalities. There is mild left ventricular hypertrophy.  Left ventricular diastolic parameters  are consistent with Grade I diastolic dysfunction (impaired relaxation).   2. Right ventricular systolic function is normal. The right ventricular  size is normal. There is normal pulmonary artery systolic pressure.   3. Left atrial size was mildly dilated.   4. Possible pericardial cyst adjacent to basal inferolateral left  ventricle.   5. No significant valvular abnormality. Normal right atrial pressure.   6. No significant change compared to previous study on 07/31/2020.    Coronary angiography 09/05/2020 [facility]):   Mid RCA lesion is 99% stenosed.   Prox LAD to Mid LAD lesion is 80% stenosed. (Pesonally reviewed. Appears 80%)   Mid Cx lesion is 40% stenosed.  Coronary  angioplasty to right coronary artery on 09/09/2020: Unsuccessful attempt at PCI to the right coronary artery with attendant complication including extensive dissection involving the entire right coronary artery.  As there was still TIMI I-I 0.5 flow, to prevent retrograde dissection of the ostial RCA into the aorta and also to prevent further antegrade dissection or complete closure of the vessel, a 3.5 x 15 millimeters resolute frontier DES was deployed.  Overall extensive dissection is evident involving the proximal and all the way to the distal RCA with no chance of successful angioplasty.  No devices could not cross after the initial angioplasty at the proximal right coronary artery segment.  1.5 mm balloon could not also cross.  This is in spite of GuideLiner support right from the get go.  Could not pass a heavier wire as well, after initial angioplasty I had attempted to place Iron Man guidewire but I was unable to cross the stenosis probably related to significant calcium and dissection at the focal site.  30 mL of contrast was utilized.  Patient will be admitted to the ICU, I have updated patient's daughter regarding the complication.  Echocardiogram 09/10/2020: Normal LVEF, minimal basal hypokinesis cannot be excluded.  No significant valvular abnormality.  Moderate LVH.  Overall no significant change from 09/06/2020.  EKG: EKG 09/04/2020: Sinus rhythm Possible old anteroseptal infarct Inferolateral ST depression  09/10/2020: Mobitz 1 AV block @ 53/min. ST-T abnormality, with T inversion consider anterior ischemia. Compared to prior EKG, heart block new.   EKG 09/11/2020: Mobitz 1 AV block.  Normal axis.  ST-T abnormality, consider anterolateral ischemia.  No significant change from prior EKG.   Scheduled Meds:  (feeding supplement) PROSource Plus  30 mL Oral BID BM   aspirin EC  81 mg Oral Daily   atorvastatin  40 mg Oral Daily   brexpiprazole  1 mg Oral Daily   Chlorhexidine Gluconate  Cloth  6 each Topical Daily   famotidine  10 mg Oral QHS   feeding supplement (GLUCERNA SHAKE)  237 mL Oral BID BM   gabapentin  300 mg Oral QHS   heparin injection (subcutaneous)  5,000 Units Subcutaneous Q8H   insulin aspart  0-5 Units Subcutaneous QHS   insulin aspart  0-9 Units Subcutaneous TID WC   insulin aspart  4 Units Subcutaneous TID WC   insulin glargine-yfgn  18 Units Subcutaneous Daily   isosorbide mononitrate  30 mg Oral Daily   mirtazapine  15 mg Oral QHS   multivitamin with minerals  1 tablet Oral Daily   nicotine  21 mg Transdermal Daily   pantoprazole  40 mg Oral BID   senna-docusate  1 tablet Oral BID  sodium chloride flush  3 mL Intravenous Q12H   ticagrelor  90 mg Oral BID   Continuous Infusions:  sodium chloride 50 mL/hr at 09/09/20 1240   sodium chloride     PRN Meds:.sodium chloride, acetaminophen, albuterol, fentaNYL (SUBLIMAZE) injection, metoCLOPramide, nitroGLYCERIN, ondansetron (ZOFRAN) IV, oxyCODONE, polyethylene glycol, sodium chloride flush  Assessment/Plan:  Mobitz 1 AV Block. 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 S. Cr. Elevated, probably due to ATN from hypotension, doubt contrast nephropathy. Chronic anemia, Anemia panel suggests anemia of chronic disease..  2 uncontrolled with hyperglycemia and chronic stage 3 b CKD. External hemorrhoids   Rec:   Patient is back in sinus rhythm on telemetry.  She has not needed any pressor support.  Echo reveals preserved LVEF. She can be transferred to telemetry, I will start her on low-dose of a beta-blocker in view of prior resistant hypertension, do not want her to have rebound tachycardia or hypotension.  She has very large hemorrhoids and may need GI evaluation.  Anemia panel suggest chronic anemia and not iron deficiency anemia.  Continue to avoid ACE inhibitor's and ARB in view of chronic renal failure.  Diabetes is being managed per primary team.  Discussed with Dr. Susanne Greenhouse, MD, [facility] 09/11/2020, 8:38 AM Office: [phone] Fax: [phone] Pager: [phone]

## 2020-09-11 NOTE — Progress Notes (Signed)
Hypoglycemic Event  CBG: 65  Treatment: 4 oz juice/soda  Symptoms: None  Follow-up CBG: Time: 2146 CBG Result: 66   Possible Reasons for Event: Inadequate meal intake  Comments/MD notified:Dr. Mansy notified via [place] communication.    Erica Mercer

## 2020-09-11 NOTE — Progress Notes (Signed)
Patient refused 1/2 amp of D50. Patient states "my blood sugar is already up to 100, I don't want to take that it'll make it too high".

## 2020-09-11 NOTE — Progress Notes (Signed)
Hypoglycemic Event  CBG: 66   Treatment: 8 oz juice/soda  Symptoms: Shaky  Follow-up CBG: Time:2209 CBG Result:100  Possible Reasons for Event: Inadequate meal intake  Comments/MD notified:Dr. Mansy notified. New order for 1/2 amp D50.

## 2020-09-11 NOTE — Progress Notes (Signed)
CARDIAC REHAB PHASE I   PRE:  Rate/Rhythm: 90 SR  BP:  Supine: 140/84  Sitting:   Standing:    SaO2: 95% 11/2 L  MODE:  Ambulation: 190 ft   POST:  Rate/Rhythm: 102 ST  BP:  Supine:   Sitting: 152/91  Standing:    SaO2: 86%RA  95% 1 1/2 L 1410-1452 Pt receptive to walking and wanted to try on RA. Pt walked 190 ft on RA with gait belt use, rolling walker and asst x 2. Sats to low 86%, put back to oxygen in room. Denied CP, c/o neck sore. To bed after walk and set up lunch. Will not refer to CRP 2 at this time as chart states pt's best option is to return in 4 to 6 weeks for possible PCI LAD and relook RCA. Will continue to follow.    Graylon Good, RN BSN  09/11/2020 2:48 PM

## 2020-09-12 LAB — HEPATITIS PANEL, ACUTE
HCV Ab: NONREACTIVE
Hep A IgM: NONREACTIVE
Hep B C IgM: NONREACTIVE
Hepatitis B Surface Ag: NONREACTIVE

## 2020-09-12 LAB — URINALYSIS, ROUTINE W REFLEX MICROSCOPIC
Bilirubin Urine: NEGATIVE
Glucose, UA: NEGATIVE mg/dL
Hgb urine dipstick: NEGATIVE
Ketones, ur: NEGATIVE mg/dL
Leukocytes,Ua: NEGATIVE
Nitrite: NEGATIVE
Protein, ur: 100 mg/dL — AB
Specific Gravity, Urine: 1.015 (ref 1.005–1.030)
pH: 5 (ref 5.0–8.0)

## 2020-09-12 LAB — BASIC METABOLIC PANEL
Anion gap: 7 (ref 5–15)
BUN: 77 mg/dL — ABNORMAL HIGH (ref 8–23)
CO2: 21 mmol/L — ABNORMAL LOW (ref 22–32)
Calcium: 8.6 mg/dL — ABNORMAL LOW (ref 8.9–10.3)
Chloride: 108 mmol/L (ref 98–111)
Creatinine, Ser: 2.13 mg/dL — ABNORMAL HIGH (ref 0.44–1.00)
GFR, Estimated: 25 mL/min — ABNORMAL LOW (ref >60)
Glucose, Bld: 171 mg/dL — ABNORMAL HIGH (ref 70–99)
Potassium: 4.7 mmol/L (ref 3.5–5.1)
Sodium: 136 mmol/L (ref 135–145)

## 2020-09-12 LAB — CBC
HCT: 23.1 % — ABNORMAL LOW (ref 36.0–46.0)
Hemoglobin: 7.6 g/dL — ABNORMAL LOW (ref 12.0–15.0)
MCH: 33.2 pg (ref 26.0–34.0)
MCHC: 32.9 g/dL (ref 30.0–36.0)
MCV: 100.9 fL — ABNORMAL HIGH (ref 80.0–100.0)
Platelets: 260 10*3/uL (ref 150–400)
RBC: 2.29 MIL/uL — ABNORMAL LOW (ref 3.87–5.11)
RDW: 17.8 % — ABNORMAL HIGH (ref 11.5–15.5)
WBC: 13.6 10*3/uL — ABNORMAL HIGH (ref 4.0–10.5)
nRBC: 0 % (ref 0.0–0.2)

## 2020-09-12 LAB — CK: Total CK: 298 U/L — ABNORMAL HIGH (ref 38–234)

## 2020-09-12 LAB — GLUCOSE, CAPILLARY
Glucose-Capillary: 191 mg/dL — ABNORMAL HIGH (ref 70–99)
Glucose-Capillary: 142 mg/dL — ABNORMAL HIGH (ref 70–99)
Glucose-Capillary: 366 mg/dL — ABNORMAL HIGH (ref 70–99)
Glucose-Capillary: 301 mg/dL — ABNORMAL HIGH (ref 70–99)

## 2020-09-12 MED ORDER — METOPROLOL TARTRATE 25 MG PO TABS
25.0000 mg | ORAL_TABLET | Freq: Two times a day (BID) | ORAL | Status: DC
  Administered 2020-09-12 – 2020-09-14 (×5): 25 mg via ORAL
  Filled 2020-09-12 (×6): qty 1

## 2020-09-12 NOTE — Evaluation (Signed)
Physical Therapy Evaluation Patient Details Name: Erica Mercer MRN: [id number] DOB: Mar 18, 1955 Today's Date: 09/12/2020   History of Present Illness  Pt is a 65 y.o. admitted 09/11/20 with SOB, chest pain; workup for NSTEMI. Pt underwent LHC at [facility] which showed 90% RCA stenosis with severe calcification, unable to be ballooned; transfer to [facility] for further intervention. Pt also with chronic rectal bleeding with constipation. PMH includes DM, HTN, anemia, bipolar disorder, cigarette smoker.   Clinical Impression  Pt presents with an overall decrease in functional mobility secondary to above. PTA, pt mod indep with rollator, does not drive; reports she plans to d/c to brother's home who can assist PRN. Today, pt ambulatory with rollator and intermittent min guard for balance; pt required prolonged seated rest break during ambulation secondary to DOE 3-4/4. Educ re: activity recommendations, activity pacing, pursed lip breathing. Pt would benefit from continued acute PT services to maximize functional mobility and independence prior to d/c with HHPT.   SpO2 88% on RA at rest SpO2 85% on RA with ambulation    Follow Up Recommendations Home health PT;Supervision - Intermittent    Equipment Recommendations  3in1 (PT)    Recommendations for Other Services       Precautions / Restrictions Precautions Precautions: Fall;Other (comment) Precaution Comments: Watch SpO2 - down to 85% on RA (does not wear home O2) Restrictions Weight Bearing Restrictions: No      Mobility  Bed Mobility               General bed mobility comments: Received sitting in recliner    Transfers Overall transfer level: Needs assistance Equipment used: None;4-wheeled walker Transfers: Sit to/from Stand Sit to Stand: Min guard         General transfer comment: min guard for balance standing without DME from recliner; min guard with sit<>stand from rollator seat  Ambulation/Gait Ambulation/Gait  assistance: Min guard;Supervision Gait Distance (Feet): 350 Feet Assistive device: 4-wheeled walker Gait Pattern/deviations: Step-through pattern;Decreased stride length;Trunk flexed Gait velocity: Decreased   General Gait Details: Slow, mostly steady gait with rollator and min guard for balance/lines; pt required 1x seated rest break secondary to DOE 3-4/4, SpO2 down to 85% on RA; cues for activity pacing and pursed lip breathing  Stairs            Wheelchair Mobility    Modified Rankin (Stroke Patients Only)       Balance Overall balance assessment: Needs assistance   Sitting balance-Leahy Scale: Good       Standing balance-Leahy Scale: Fair Standing balance comment: can static stand and take steps without UE support; static and dynamic stability improved with UE support                             Pertinent Vitals/Pain Pain Assessment: No/denies pain    Home Living Family/patient expects to be discharged to:: Private residence Living Arrangements: Other relatives (brother) Available Help at Discharge: Family;Available PRN/intermittently Type of Home: House Home Access: Level entry     Home Layout: One level Home Equipment: Grab bars - tub/shower;Walker - 4 wheels;Cane - single point;Shower seat Additional Comments: Pt reports plan to d/c to brother's home in [place] who can assist when he's not working    Prior Function Level of Independence: Independent with assistive device(s)         Comments: Limited community ambulator with rollator; mod indep with ADLs/iADLs, family assist withhousehold tasks. Does not drive  Hand Dominance        Extremity/Trunk Assessment   Upper Extremity Assessment Upper Extremity Assessment: Generalized weakness    Lower Extremity Assessment Lower Extremity Assessment: Generalized weakness    Cervical / Trunk Assessment Cervical / Trunk Assessment: Kyphotic  Communication      Cognition  Arousal/Alertness: Awake/alert Behavior During Therapy: WFL for tasks assessed/performed Overall Cognitive Status: No family/caregiver present to determine baseline cognitive functioning                                 General Comments: WFL for simple tasks; question some decreased awareness/insight into current medical status and functional mobility      General Comments General comments (skin integrity, edema, etc.): SpO2 down to 85% on RA with mobility, returning to >/88% with seated rest and pursed lip breathing (RN notified); pt aware of need for oxygen with mobility    Exercises     Assessment/Plan    PT Assessment Patient needs continued PT services  PT Problem List Decreased strength;Decreased activity tolerance;Decreased balance;Decreased mobility;Cardiopulmonary status limiting activity       PT Treatment Interventions DME instruction;Gait training;Functional mobility training;Therapeutic activities;Therapeutic exercise;Balance training;Patient/family education    PT Goals (Current goals can be found in the Care Plan section)  Acute Rehab PT Goals Patient Stated Goal: Breathe better PT Goal Formulation: With patient Time For Goal Achievement: 09/26/20 Potential to Achieve Goals: Good    Frequency Min 3X/week   Barriers to discharge Decreased caregiver support      Co-evaluation               AM-PAC PT "6 Clicks" Mobility  Outcome Measure Help needed turning from your back to your side while in a flat bed without using bedrails?: None Help needed moving from lying on your back to sitting on the side of a flat bed without using bedrails?: None Help needed moving to and from a bed to a chair (including a wheelchair)?: A Little Help needed standing up from a chair using your arms (e.g., wheelchair or bedside chair)?: A Little Help needed to walk in hospital room?: A Little Help needed climbing 3-5 steps with a railing? : A Little 6 Click Score:  20    End of Session Equipment Utilized During Treatment: Gait belt Activity Tolerance: Patient tolerated treatment well;Patient limited by fatigue Patient left: in chair;with call bell/phone within reach Nurse Communication: Mobility status PT Visit Diagnosis: Other abnormalities of gait and mobility (R26.89)    Time: [id number] PT Time Calculation (min) (ACUTE ONLY): 20 min   Charges:   PT Evaluation $PT Eval Moderate Complexity: [place], PT, DPT Acute Rehabilitation Services  Pager [phone] Office [place] 09/12/2020, 12:49 PM

## 2020-09-12 NOTE — Progress Notes (Signed)
PROGRESS NOTE    Erica Mercer  [id number] DOB: Sep 02, 1955 DOA: 09/05/2020 PCP: [name], NP    No chief complaint on file.   Brief Narrative:  Erica Mercer is a 65 y.o. female with medical history significant of IDDM, HTN, HLD, chronic anemia, cigarette smoker, bipolar disorder, presented with SOB and chest pain.    Underwent left side cardiac cath at [facility]  which showed 90% RCA stenosis with severe calcification, unable to be ballooned.  I recommend patient to be transferred to [facility] for further intervention of RCA Monday.    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, she was transferred to ICU, cardiology managing  Hospitalist manage diabetes care    Subjective:  Uneventful night, currently she denies pain, no active bleeding She states oxygen helped her breathing, she desires to go home on home oxygen Report feeling weak, she agreed to work with physical therapy    Assessment & Plan:   Principal Problem:   NSTEMI (non-ST elevated myocardial infarction) ([place]) Active Problems:   Severe protein-calorie malnutrition ([place])   Essential hypertension   HLD (hyperlipidemia)   COPD (chronic obstructive pulmonary disease) ([place])   Bipolar 1 disorder ([place])   Non-ST elevation (NSTEMI) myocardial infarction ([place])   AKI (acute kidney injury) ([place])   Stage 3a chronic kidney disease ([place])   Type II diabetes mellitus with renal manifestations ([place])   Anemia of chronic disease   Grade III hemorrhoids  Non-STEMI In the event of unsuccessful cardiac catheterization on 8/16 detail please refer to original report Currently she has no chest pain Management per cardiology  Hypotension ( developed on 8/16-8/17 night) Per cardiology hypotension likely from above is 1 AV block possibly RV involvement from RCA occlusion Anemia could also contribute Was on  dopamine drip briefly, blood pressure stable off dopamine drip on 8/18 Management per cardiology    Diastolic CHF Report present with bilateral lower extremity edema edema has much improved Close monitor volume status Cardiology managing   Acute on chronic anemia Hemoglobin at baseline appear to be between 8 and 9 --Does not appear to have active bleed, no bowel movement for several days -Received PRBC transfusionx1 -Currently on asa/ brilinta which cardiology prefer to continue  -had lower gi bleed , likely hemorrhoid bleed, cardiology recommend GI consult, appreciate GI input  AKI on CKDIV Cr increased, likely from contrast and hypotension Renal US no obstruction,  He denies urinary symptom Avoid nephrotoxin, avoid hypotension Creatinine improved Repeat bmp in am, renal dosing meds   Insulin-dependent type 2 diabetes, uncontrolled, with hyperglycemia A1c 15.4 Appear has been in the hospital multiple times for DKA this year Need diabetes education Continue adjust insulin, increase meal coverage, increase long-acting insulin tomorrow,   Constipation Start stool softener  History of asthma No wheezing on exam  FTT, start PT  Nutritional Assessment: The patient's BMI is: Body mass index is 22.46 kg/m.Marland Kitchen  Seen by dietician.  I agree with the assessment and plan as outlined below:  Nutrition Status: Nutrition Problem: Severe Malnutrition Etiology: chronic illness Signs/Symptoms: severe fat depletion, severe muscle depletion Interventions: Refer to RD note for recommendations  .    Unresulted Labs (From admission, onward)     Start     Ordered   09/12/20 1140  CBC  ONCE - STAT,   STAT       Question:  Specimen collection method  Answer:  Lab=Lab collect  09/12/20 1140   09/10/20 1733  MRSA Next Gen by PCR, Nasal  Once,   R        09/10/20 1732   Unscheduled  Occult blood card to lab, stool  As needed,   R      09/11/20 1639              DVT  prophylaxis: heparin injection 5,000 Units Start: 09/10/20 1400   Code Status: Full Family Communication: Daughter at bedside on 8/17 Disposition:   Status is: Inpatient   Dispo: The patient is from: Home              Anticipated d/c is to: Possible over the weekend              Anticipated d/c date is: Need cardiology clearance                Consultants:  Cardiology GI  Procedures:  Cardiac catheterization  Antimicrobials:   None     Objective: Vitals:   09/12/20 0018 09/12/20 0300 09/12/20 0744 09/12/20 1125  BP:  (!) 152/82 133/72 (!) 150/86  Pulse:  95 85 (!) 103  Resp: 20 20 (!) 23 20  Temp:  98.3 F (36.8 C) 98.8 F (37.1 C) 98.9 F (37.2 C)  TempSrc:  Oral Oral Oral  SpO2:  96% 92% 92%  Weight:  55.7 kg    Height:        Intake/Output Summary (Last 24 hours) at 09/12/2020 1210 Last data filed at 09/12/2020 0422 Gross per 24 hour  Intake 243 ml  Output 820 ml  Net -577 ml   Filed Weights   09/05/20 1947 09/12/20 0300  Weight: 43 kg 55.7 kg    Examination:  General exam: calm, NAD Respiratory system: Clear to auscultation. Respiratory effort normal. Cardiovascular system: S1 & S2 heard, RRR. No JVD, no murmur, No pedal edema. Gastrointestinal system: Abdomen is nondistended, soft and nontender. Normal bowel sounds heard. Central nervous system: Alert and oriented. No focal neurological deficits. Extremities: Trace bilateral lower extremity edema, right greater than left, report has much improved compared to before Skin: No rashes, lesions or ulcers Psychiatry: Judgement and insight appear normal. Mood & affect appropriate.     Data Reviewed: I have personally reviewed following labs and imaging studies  CBC: Recent Labs  Lab 09/07/20 0230 09/08/20 0222 09/09/20 0247 09/10/20 0209 09/11/20 0117  WBC 8.0 7.8 6.6 9.9 8.9  HGB 7.5* 7.9* 7.1* 6.9* 6.7*  HCT 23.7* 25.0* 22.5* 21.7* 20.3*  MCV 102.6* 103.7* 104.7* 104.8* 98.5  PLT 237  233 227 244 [id number]    Basic Metabolic Panel: Recent Labs  Lab 09/08/20 1232 09/09/20 0247 09/10/20 0209 09/11/20 0117 09/12/20 0054  NA 138 133* 137 136 136  K 4.0 4.6 5.0 4.9 4.7  CL 107 104 108 107 108  CO2 23 21* 19* 22 21*  GLUCOSE 173* 386* 290* 209* 171*  BUN 44* 52* 62* 84* 77*  CREATININE 1.94* 2.14* 1.88* 2.84* 2.13*  CALCIUM 8.9 8.5* 8.9 8.6* 8.6*    GFR: Estimated Creatinine Clearance: 20.8 mL/min (A) (by C-G formula based on SCr of 2.13 mg/dL (H)).  Liver Function Tests: Recent Labs  Lab 09/06/20 0530  AST 109*  ALT 51*  ALKPHOS 124  BILITOT 0.4  PROT 5.1*  ALBUMIN 2.4*    CBG: Recent Labs  Lab 09/11/20 2122 09/11/20 2146 09/11/20 2209 09/12/20 0609 09/12/20 1123  GLUCAP 65* 66* 100* 191* 142*  Recent Results (from the past 240 hour(s))  SARS CORONAVIRUS 2 (TAT 6-24 HRS) Nasopharyngeal Nasopharyngeal Swab     Status: None   Collection Time: 09/03/20  2:15 PM   Specimen: Nasopharyngeal Swab  Result Value Ref Range Status   SARS Coronavirus 2 NEGATIVE NEGATIVE Final    Comment: (NOTE) SARS-CoV-2 target nucleic acids are NOT DETECTED.  The SARS-CoV-2 RNA is generally detectable in upper and lower respiratory specimens during the acute phase of infection. Negative results do not preclude SARS-CoV-2 infection, do not rule out co-infections with other pathogens, and should not be used as the sole basis for treatment or other patient management decisions. Negative results must be combined with clinical observations, patient history, and epidemiological information. The expected result is Negative.  Fact Sheet for Patients: SugarRoll.be  Fact Sheet for Healthcare Providers: https://www.woods-mathews.com/  This test is not yet approved or cleared by the [place] FDA and  has been authorized for detection and/or diagnosis of SARS-CoV-2 by FDA under an Emergency Use Authorization (EUA). This EUA  will remain  in effect (meaning this test can be used) for the duration of the COVID-19 declaration under Se ction 564(b)(1) of the Act, 21 U.S.C. section 360bbb-3(b)(1), unless the authorization is terminated or revoked sooner.  Performed at [facility], [address], [place], [place] [address]          Radiology Studies: US RENAL  Result Date: 09/11/2020 CLINICAL DATA:  Elevated serum creatinine.  Diabetes.  Hypertension EXAM: RENAL / URINARY TRACT ULTRASOUND COMPLETE COMPARISON:  Ultrasound 08/22/2020, CT 08/20/2020 FINDINGS: Right Kidney: Renal measurements: 8.5 x 3.3 x 5.0 cm = volume: 74 mL. Mildly increased renal cortical echogenicity. No mass or hydronephrosis visualized. Left Kidney: Renal measurements: 11.0 x 5.8 x 5.1 cm = volume: 192 mL. Echogenicity within normal limits. No mass or hydronephrosis visualized. Bladder: Appears normal for degree of bladder distention. Other: Trace perihepatic ascites. Right-sided pleural effusion is partially visualized. IMPRESSION: 1. No evidence of obstructive uropathy. 2. Slightly asymmetrically atrophic right kidney. Mildly increased right renal cortical echogenicity suggesting medical renal disease. 3. Unremarkable sonographic appearance of the left kidney. Electronically Signed   By: Davina Poke D.O.   On: 09/11/2020 12:02        Scheduled Meds:  (feeding supplement) PROSource Plus  30 mL Oral BID BM   aspirin EC  81 mg Oral Daily   atorvastatin  40 mg Oral Daily   brexpiprazole  1 mg Oral Daily   Chlorhexidine Gluconate Cloth  6 each Topical Daily   dextrose  25 mL Intravenous Once   famotidine  10 mg Oral QHS   feeding supplement (GLUCERNA SHAKE)  237 mL Oral BID BM   gabapentin  300 mg Oral QHS   heparin injection (subcutaneous)  5,000 Units Subcutaneous Q8H   hydrocortisone  25 mg Rectal BID   insulin aspart  0-5 Units Subcutaneous QHS   insulin aspart  0-9 Units Subcutaneous TID WC   insulin aspart  4 Units  Subcutaneous TID WC   isosorbide mononitrate  30 mg Oral Daily   mirtazapine  15 mg Oral QHS   multivitamin with minerals  1 tablet Oral Daily   nicotine  21 mg Transdermal Daily   pantoprazole  40 mg Oral BID   psyllium  1 packet Oral Daily   senna-docusate  1 tablet Oral BID   sodium chloride flush  3 mL Intravenous Q12H   ticagrelor  90 mg Oral BID   Continuous  Infusions:  sodium chloride 50 mL/hr at 09/09/20 1240   sodium chloride       LOS: 7 days   Time spent: 24mns Greater than 50% of this time was spent in counseling, explanation of diagnosis, planning of further management, and coordination of care.   Voice Recognition /Viviann Sparedictation system was used to create this note, attempts have been made to correct errors. Please contact the author with questions and/or clarifications.   FFlorencia Reasons MD PhD FACP [facility]  Available via Epic secure chat 7am-7pm for nonurgent issues Please page for urgent issues To page the attending provider between 7A-7P or the covering provider during after hours 7P-7A, please log into the web site [email or web address] and access using universal [place] password for that web site. If you do not have the password, please call the hospital operator.    09/12/2020, 12:10 PM

## 2020-09-12 NOTE — Progress Notes (Signed)
Daily Rounding Note  09/12/2020, 9:48 AM  LOS: 7 days   SUBJECTIVE:   Chief complaint:    Hemorrhoidal bleeding.  Anemia.  No stools or bleeding overnight.  OBJECTIVE:         Vital signs in last 24 hours:    Temp:  [98.3 F (36.8 C)-100.3 F (37.9 C)] 98.8 F (37.1 C) (08/19 0744) Pulse Rate:  [82-95] 85 (08/19 0744) Resp:  [17-27] 23 (08/19 0744) BP: (117-152)/(71-102) 133/72 (08/19 0744) SpO2:  [92 %-97 %] 92 % (08/19 0744) Weight:  [55.7 kg] 55.7 kg (08/19 0300) Last BM Date: 09/11/20 Filed Weights   09/05/20 1947 09/12/20 0300  Weight: 43 kg 55.7 kg   General: Thin, comfortable, loquacious.  Looks chronically unwell. Heart: RRR. Chest: Diminished breath sounds but clear.  Hoarse, smoker quality voice. Abdomen: Soft, thin.  No distention.  Active bowel sounds. Extremities: Thin.  No CCE. Neuro/Psych: Animated bordering on agitation.  Fluid speech.  Moves all 4 limbs.  No tremors or gross weakness.  Intake/Output from previous day: 08/18 0701 - 08/19 0700 In: 243 [P.O.:240; I.V.:3] Out: 820 [Urine:820]  Intake/Output this shift: No intake/output data recorded.  Lab Results: Recent Labs    09/10/20 0209 09/11/20 0117  WBC 9.9 8.9  HGB 6.9* 6.7*  HCT 21.7* 20.3*  PLT 244 198   BMET Recent Labs    09/10/20 0209 09/11/20 0117 09/12/20 0054  NA 137 136 136  K 5.0 4.9 4.7  CL 108 107 108  CO2 19* 22 21*  GLUCOSE 290* 209* 171*  BUN 62* 84* 77*  CREATININE 1.88* 2.84* 2.13*  CALCIUM 8.9 8.6* 8.6*   LFT No results for input(s): PROT, ALBUMIN, AST, ALT, ALKPHOS, BILITOT, BILIDIR, IBILI in the last 72 hours. PT/INR No results for input(s): LABPROT, INR in the last 72 hours. Hepatitis Panel No results for input(s): HEPBSAG, HCVAB, HEPAIGM, HEPBIGM in the last 72 hours.  Studies/Results: US RENAL  Result Date: 09/11/2020 CLINICAL DATA:  Elevated serum creatinine.  Diabetes.  Hypertension  EXAM: RENAL / URINARY TRACT ULTRASOUND COMPLETE COMPARISON:  Ultrasound 08/22/2020, CT 08/20/2020 FINDINGS: Right Kidney: Renal measurements: 8.5 x 3.3 x 5.0 cm = volume: 74 mL. Mildly increased renal cortical echogenicity. No mass or hydronephrosis visualized. Left Kidney: Renal measurements: 11.0 x 5.8 x 5.1 cm = volume: 192 mL. Echogenicity within normal limits. No mass or hydronephrosis visualized. Bladder: Appears normal for degree of bladder distention. Other: Trace perihepatic ascites. Right-sided pleural effusion is partially visualized. IMPRESSION: 1. No evidence of obstructive uropathy. 2. Slightly asymmetrically atrophic right kidney. Mildly increased right renal cortical echogenicity suggesting medical renal disease. 3. Unremarkable sonographic appearance of the left kidney. Electronically Signed   By: Davina Poke D.O.   On: 09/11/2020 12:02    Scheduled Meds:  (feeding supplement) PROSource Plus  30 mL Oral BID BM   aspirin EC  81 mg Oral Daily   atorvastatin  40 mg Oral Daily   brexpiprazole  1 mg Oral Daily   Chlorhexidine Gluconate Cloth  6 each Topical Daily   dextrose  25 mL Intravenous Once   famotidine  10 mg Oral QHS   feeding supplement (GLUCERNA SHAKE)  237 mL Oral BID BM   gabapentin  300 mg Oral QHS   heparin injection (subcutaneous)  5,000 Units Subcutaneous Q8H   hydrocortisone  25 mg Rectal BID   insulin aspart  0-5 Units Subcutaneous QHS   insulin aspart  0-9  Units Subcutaneous TID WC   insulin aspart  4 Units Subcutaneous TID WC   isosorbide mononitrate  30 mg Oral Daily   mirtazapine  15 mg Oral QHS   multivitamin with minerals  1 tablet Oral Daily   nicotine  21 mg Transdermal Daily   pantoprazole  40 mg Oral BID   psyllium  1 packet Oral Daily   senna-docusate  1 tablet Oral BID   sodium chloride flush  3 mL Intravenous Q12H   ticagrelor  90 mg Oral BID   Continuous Infusions:  sodium chloride 50 mL/hr at 09/09/20 1240   sodium chloride     PRN  Meds:.sodium chloride, acetaminophen, albuterol, fentaNYL (SUBLIMAZE) injection, metoCLOPramide, morphine injection, nitroGLYCERIN, ondansetron (ZOFRAN) IV, oxyCODONE, polyethylene glycol, sodium chloride flush, witch hazel-glycerin  ASSESMENT:   Acute on chronic hemorrhoidal bleeding in setting of anticoagulation.  Psyllium, senna, Anusol HC PR in place.  Invasive procedures to address hemorrhoidal bleeding not appropriate in the setting of non-STEMI.  Bleeding will be hard to control in setting of required Brilinta, aspirin, SQ Heparin.    Blood loss anemia, baseline anemia of chronic disease.   Received 1 PRBC so far.  Hgb 6.7 this AM.  Cardiologist had not wanted her to receive additional PRBC when Hgb 6.9 early yesterday morning.  CAD, RCA occlusion.  RCA dissection at cath.  Stent placed.  [place] 1 AV block.  Diastolic heart failure.  Remains on low-dose aspirin, SQ heparin, Brilinta.     IDDM.  A1c 15.4.    AKI.  Stage III CKD.  Protein calorie malnutrition, severe.  BMI 17.3.  no food intolerance.     PLAN   Strongly consider transfusing with PRBC.    GI signing off.  Call if re-involvement needed.  Continue current measures of psyllium, HC suppositories, aspirin   Azucena Freed  09/12/2020, 9:48 AM Phone [phone]

## 2020-09-12 NOTE — Progress Notes (Signed)
Subjective:  She has no specific complaints today.  No further abdominal discomfort today, states that she has been having regular bowel movements and feels very comfortable.  Hemorrhoids continue to bother her.   Intake/Output from previous day:  I/O last 3 completed shifts: In: 483 [P.O.:480; I.V.:3] Out: 1345 [Urine:1345] No intake/output data recorded.  Blood pressure 134/81, pulse 92, temperature 98.9 F (37.2 C), temperature source Oral, resp. rate 18, height 5' 2"  (1.575 m), weight 55.7 kg, SpO2 91 %.  Physical Exam Constitutional:      Appearance: She is ill-appearing.     Comments: Frail  Neck:     Vascular: No carotid bruit or JVD.  Cardiovascular:     Rate and Rhythm: Normal rate and regular rhythm.     Pulses: Decreased pulses (pedal pulse).     Heart sounds: Normal heart sounds. No murmur heard.   No gallop.  Pulmonary:     Effort: Pulmonary effort is normal.     Breath sounds: Normal breath sounds.  Abdominal:     General: Bowel sounds are normal.     Palpations: Abdomen is soft.  Musculoskeletal:        General: No swelling.    Lab Results: BMP BNP (last 3 results) Recent Labs    09/03/20 1035  BNP 1,371.8*     ProBNP (last 3 results) No results for input(s): PROBNP in the last 8760 hours. BMP Latest Ref Rng & Units 09/12/2020 09/11/2020 09/10/2020  Glucose 70 - 99 mg/dL 171(H) 209(H) 290(H)  BUN 8 - 23 mg/dL 77(H) 84(H) 62(H)  Creatinine 0.44 - 1.00 mg/dL 2.13(H) 2.84(H) 1.88(H)  Sodium 135 - 145 mmol/L 136 136 137  Potassium 3.5 - 5.1 mmol/L 4.7 4.9 5.0  Chloride 98 - 111 mmol/L 108 107 108  CO2 22 - 32 mmol/L 21(L) 22 19(L)  Calcium 8.9 - 10.3 mg/dL 8.6(L) 8.6(L) 8.9   Hepatic Function Latest Ref Rng & Units 09/06/2020 09/03/2020 08/20/2020  Total Protein 6.5 - 8.1 g/dL 5.1(L) 6.6 6.7  Albumin 3.5 - 5.0 g/dL 2.4(L) 3.2(L) 3.7  AST 15 - 41 U/L 109(H) 86(H) 36  ALT 0 - 44 U/L 51(H) 55(H) 38  Alk Phosphatase 38 - 126 U/L 124 143(H) 176(H)  Total  Bilirubin 0.3 - 1.2 mg/dL 0.4 0.8 1.4(H)  Bilirubin, Direct 0.0 - 0.2 mg/dL <0.1 0.1 -   CBC Latest Ref Rng & Units 09/12/2020 09/11/2020 09/10/2020  WBC 4.0 - 10.5 K/uL 13.6(H) 8.9 9.9  Hemoglobin 12.0 - 15.0 g/dL 7.6(L) 6.7(LL) 6.9(LL)  Hematocrit 36.0 - 46.0 % 23.1(L) 20.3(L) 21.7(L)  Platelets 150 - 400 K/uL 260 198 244   Lipid Panel     Component Value Date/Time   CHOL 267 (H) 08/21/2020 0432   TRIG 243 (H) 08/21/2020 0432   HDL 65 08/21/2020 0432   CHOLHDL 4.1 08/21/2020 0432   VLDL 49 (H) 08/21/2020 0432   LDLCALC 153 (H) 08/21/2020 0432   Cardiac Panel (last 3 results) Recent Labs    09/12/20 1217  CKTOTAL 298*    HEMOGLOBIN A1C Lab Results  Component Value Date   HGBA1C 15.4 (H) 08/20/2020   MPG 395 08/20/2020   TSH Recent Labs    08/01/20 0126 09/03/20 1320  TSH 1.432 1.029    Imaging: Imaging results have been reviewed  Cardiac Studies:   Echocardiogram 09/06/2020:  1. Left ventricular ejection fraction, by estimation, is 55 to 60%. The  left ventricle has normal function. The left ventricle has no regional  wall  motion abnormalities. There is mild left ventricular hypertrophy.  Left ventricular diastolic parameters  are consistent with Grade I diastolic dysfunction (impaired relaxation).   2. Right ventricular systolic function is normal. The right ventricular  size is normal. There is normal pulmonary artery systolic pressure.   3. Left atrial size was mildly dilated.   4. Possible pericardial cyst adjacent to basal inferolateral left  ventricle.   5. No significant valvular abnormality. Normal right atrial pressure.   6. No significant change compared to previous study on 07/31/2020.    Coronary angiography 09/05/2020 [facility]):   Mid RCA lesion is 99% stenosed.   Prox LAD to Mid LAD lesion is 80% stenosed. (Pesonally reviewed. Appears 80%)   Mid Cx lesion is 40% stenosed.  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  As there was still TIMI I-I 0.5 flow, to prevent retrograde dissection of the ostial RCA into the aorta and also to prevent further antegrade dissection or complete closure of the vessel, a 3.5 x 15 millimeters resolute frontier DES was deployed.  Overall extensive dissection is evident involving the proximal and all the way to the distal RCA with no chance of successful angioplasty.  No devices could not cross after the initial angioplasty at the proximal right coronary artery segment.  1.5 mm balloon could not also cross.  This is in spite of GuideLiner support right from the get go.  Could not pass a heavier wire as well, after initial angioplasty I had attempted to place Iron Man guidewire but I was unable to cross the stenosis probably related to significant calcium and dissection at the focal site.  30 mL of contrast was utilized.  Patient will be admitted to the ICU, I have updated patient's daughter regarding the complication.  Echocardiogram 09/10/2020: 1. Left ventricular ejection fraction, by estimation, is 55 to 60%. Left ventricular ejection fraction by PLAX is 73 %. The left ventricle has normal function. There is moderate left ventricular hypertrophy. Left ventricular diastolic function could not be evaluated. There is mild hypokinesis of the left ventricular, basal inferior wall. 2. Right ventricular systolic function is normal. The right ventricular size is normal. There is normal pulmonary artery systolic pressure. The estimated right ventricular systolic pressure is 06.3 mmHg. 3. Left atrial size was moderately dilated. 4. Right atrial size was moderately dilated. 5. The mitral valve is normal in structure. Mild mitral valve regurgitation. 6. Tricuspid valve regurgitation is moderate. 7. The aortic valve is normal in structure. Aortic valve  regurgitation is not visualized. 8. There is mild (Grade II) plaque involving the ascending aorta. The inferior vena cava is normal in size with <50% respiratory variability, suggesting right atrial pressure of 8 mmHg. 9. Overall no significant change from 09/06/2020.  EKG: EKG 09/04/2020: Sinus rhythm Possible old anteroseptal infarct Inferolateral ST depression  09/10/2020: Mobitz 1 AV block @ 53/min. ST-T abnormality, with T inversion consider anterior ischemia. Compared to prior EKG, heart block new.   EKG 09/11/2020: Mobitz 1 AV block.  Normal axis.  ST-T abnormality, consider anterolateral ischemia.  No significant change from prior EKG.   Scheduled Meds:  (feeding supplement) PROSource Plus  30 mL Oral BID BM   aspirin EC  81 mg Oral Daily   atorvastatin  40 mg Oral Daily   brexpiprazole  1 mg Oral Daily   Chlorhexidine Gluconate Cloth  6  each Topical Daily   dextrose  25 mL Intravenous Once   famotidine  10 mg Oral QHS   feeding supplement (GLUCERNA SHAKE)  237 mL Oral BID BM   gabapentin  300 mg Oral QHS   heparin injection (subcutaneous)  5,000 Units Subcutaneous Q8H   hydrocortisone  25 mg Rectal BID   insulin aspart  0-5 Units Subcutaneous QHS   insulin aspart  0-9 Units Subcutaneous TID WC   insulin aspart  4 Units Subcutaneous TID WC   isosorbide mononitrate  30 mg Oral Daily   mirtazapine  15 mg Oral QHS   multivitamin with minerals  1 tablet Oral Daily   nicotine  21 mg Transdermal Daily   pantoprazole  40 mg Oral BID   psyllium  1 packet Oral Daily   senna-docusate  1 tablet Oral BID   sodium chloride flush  3 mL Intravenous Q12H   ticagrelor  90 mg Oral BID   Continuous Infusions:  sodium chloride 50 mL/hr at 09/09/20 1240   sodium chloride     PRN Meds:.sodium chloride, acetaminophen, albuterol, fentaNYL (SUBLIMAZE) injection, metoCLOPramide, morphine injection, nitroGLYCERIN, ondansetron (ZOFRAN) IV, oxyCODONE, polyethylene glycol, sodium chloride flush,  witch hazel-glycerin  Assessment/Plan:   Erica Mercer is a 65 y.o. African American female  with hypertension, hyperlipidemia, uncontrolled type 2 DM (A1C 15%), bipolar disorder, NSTEMI presented to [facility] on 09/03/2020 with chest pain and shortness of breath, also found to have severe hyperglycemia, without DKA. HS trop was 237 on 8/10.  EKG showed inferolateral ST depressions. Diagnostic angiogram today by Dr. Neoma Laming showed severe calcific 95% mid RCA lesion, and probably non-critical but severe calcific 90% lesion in mid LAD. Dr.Callwood attempted intervention, unsuccessful and hence transferred here for further evaluation and management of severe calcific CAD.   1.  NSTEMI 2.  Hypertension 3.  Chronic renal insufficiency stage IIIb, serum creatinine stabilized 4.  Anemia of chronic disease 5.  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  Has residual calcific 80 to 85% stenosis in the proximal LAD but does not appear complex.  Recommendation: Her blood pressure is trending up and heart rate is also trending up.  On telemetry today she is in sinus tachycardia.  She is tolerating low-dose beta-blocker, will increase to 25 mg p.o. twice daily before rebound hypertension and tachycardia occurs.  He was previously on amlodipine for hypertension control, but the risk of constipation is high with this medication hence would recommend starting her on BiDil 1 p.o. 3 times daily if blood pressure is elevated and we could also increase metoprolol to tartrate to 50 mg twice daily if needed.  From cardiac standpoint, conservative therapy for now.  In 4 to 6 weeks from now, we will have relook cardiac catheterization to reevaluate both LAD stenosis and right coronary  artery stenosis.  In spite of occlusion of the right coronary artery, repeat echocardiogram reveals no wall motion abnormality that is significant and no significant change in LVEF and this may suggest significant collateralization of the right coronary artery even prior to angioplasty.  Fortunately although she has severe anemia, no active GI bleed.  For now continue dual antiplatelet therapy.  Appreciate GI consult and recommendations.   Adrian Prows, MD, [facility] 09/12/2020, 4:22 PM Office: [phone] Fax: [phone] Pager: [phone]

## 2020-09-12 NOTE — Progress Notes (Signed)
1240-1315 Pt stated she just walked with PT and got pain med. Resting now. MI education completed with pt who voiced understanding. Stressed importance of smoking cessation and gave handout. Encouraged to call [phone] if needed. Discussed NTG use, MI restrictions, heart healthy and diabetic handouts given and importance of taking medications.  Will continue to follow pt. Graylon Good RN BSN 09/12/2020 1:24 PM

## 2020-09-12 NOTE — Progress Notes (Signed)
SATURATION QUALIFICATIONS: (This note is used to comply with regulatory documentation for home oxygen)  Patient Saturations on Room Air at Rest = 88%  Patient Saturations on Room Air while Ambulating = 86%  Patient Saturations on 2 Liters of oxygen while Ambulating = 92%  Please briefly explain why patient needs home oxygen: Pt desat bellow 90% without oxygen and with exertion.

## 2020-09-12 NOTE — Progress Notes (Signed)
Inpatient Diabetes Program Recommendations  AACE/ADA: New Consensus Statement on Inpatient Glycemic Control   Target Ranges:  Prepandial:   less than 140 mg/dL      Peak postprandial:   less than 180 mg/dL (1-2 hours)      Critically ill patients:  140 - 180 mg/dL   Results for AQUILAH, CUNDARI (MRN [id number]) as of 09/12/2020 11:07  Ref. Range 09/11/2020 06:34 09/11/2020 11:02 09/11/2020 16:17 09/11/2020 21:22 09/11/2020 21:46 09/11/2020 22:09 09/12/2020 06:09  Glucose-Capillary Latest Ref Range: 70 - 99 mg/dL 291 (H)  Novolog 5 units 291 (H)  Novolog 9 units  Semglee 18 units 93 65 (L) 66 (L) 100 (H) 191 (H)  Novolog 2 units    Review of Glycemic Control Diabetes history: DM Outpatient Diabetes medications: Semglee 18 units daily, Humalog 3 units TID with meals Current orders for Inpatient glycemic control:  Novolog 0-9 units TID with meals, Novolog 0-5 units QHS, Novolog 4 units TID with meals for meal coverage   Recommendations:  Insulin: Noted Semglee was discontinued. Please consider reordering Semglee at 15 units daily (to start now).  Thanks, Barnie Alderman, RN, MSN, CDE Diabetes Coordinator Inpatient Diabetes Program [phone] (Team Pager from 8am to 5pm)

## 2020-09-12 NOTE — TOC Progression Note (Signed)
Transition of Care [facility]) - Progression Note    Patient Details  Name: Erica Mercer MRN: [id number] Date of Birth: April 25, 1955  Transition of Care [facility]) CM/SW [place], RN Phone Number: 09/12/2020, 1:02 PM  Clinical Narrative:      Patient is requesting bedside commode and oxygen. Prior to receiving oxygen , qualifications muct be done and documented. Secure chat sent to RN for qualifications. BSC ordered from adapt.       Expected Discharge Plan and Services           Expected Discharge Date: 09/10/20                                     Social Determinants of Health (SDOH) Interventions    Readmission Risk Interventions Readmission Risk Prevention Plan 08/21/2020  Transportation Screening Complete  Medication Review Press photographer) Complete  PCP or Specialist appointment within 3-5 days of discharge Complete  HRI or [place] Complete  SW Recovery Care/Counseling Consult Complete  [place] Not Applicable

## 2020-09-13 DIAGNOSIS — D638 Anemia in other chronic diseases classified elsewhere: Secondary | ICD-10-CM

## 2020-09-13 DIAGNOSIS — I2510 Atherosclerotic heart disease of native coronary artery without angina pectoris: Secondary | ICD-10-CM

## 2020-09-13 DIAGNOSIS — I2584 Coronary atherosclerosis due to calcified coronary lesion: Secondary | ICD-10-CM

## 2020-09-13 LAB — COMPREHENSIVE METABOLIC PANEL
ALT: 54 U/L — ABNORMAL HIGH (ref 0–44)
AST: 87 U/L — ABNORMAL HIGH (ref 15–41)
Albumin: 2.1 g/dL — ABNORMAL LOW (ref 3.5–5.0)
Alkaline Phosphatase: 162 U/L — ABNORMAL HIGH (ref 38–126)
Anion gap: 9 (ref 5–15)
BUN: 68 mg/dL — ABNORMAL HIGH (ref 8–23)
CO2: 18 mmol/L — ABNORMAL LOW (ref 22–32)
Calcium: 8.3 mg/dL — ABNORMAL LOW (ref 8.9–10.3)
Chloride: 107 mmol/L (ref 98–111)
Creatinine, Ser: 1.91 mg/dL — ABNORMAL HIGH (ref 0.44–1.00)
GFR, Estimated: 29 mL/min — ABNORMAL LOW (ref >60)
Glucose, Bld: 248 mg/dL — ABNORMAL HIGH (ref 70–99)
Potassium: 4.4 mmol/L (ref 3.5–5.1)
Sodium: 134 mmol/L — ABNORMAL LOW (ref 135–145)
Total Bilirubin: 0.8 mg/dL (ref 0.3–1.2)
Total Protein: 4.9 g/dL — ABNORMAL LOW (ref 6.5–8.1)

## 2020-09-13 LAB — GLUCOSE, CAPILLARY
Glucose-Capillary: 436 mg/dL — ABNORMAL HIGH (ref 70–99)
Glucose-Capillary: 458 mg/dL — ABNORMAL HIGH (ref 70–99)
Glucose-Capillary: 405 mg/dL — ABNORMAL HIGH (ref 70–99)
Glucose-Capillary: 417 mg/dL — ABNORMAL HIGH (ref 70–99)
Glucose-Capillary: 246 mg/dL — ABNORMAL HIGH (ref 70–99)
Glucose-Capillary: 106 mg/dL — ABNORMAL HIGH (ref 70–99)
Glucose-Capillary: 286 mg/dL — ABNORMAL HIGH (ref 70–99)

## 2020-09-13 LAB — CBC
HCT: 20.7 % — ABNORMAL LOW (ref 36.0–46.0)
Hemoglobin: 6.6 g/dL — CL (ref 12.0–15.0)
MCH: 32.4 pg (ref 26.0–34.0)
MCHC: 31.9 g/dL (ref 30.0–36.0)
MCV: 101.5 fL — ABNORMAL HIGH (ref 80.0–100.0)
Platelets: 265 10*3/uL (ref 150–400)
RBC: 2.04 MIL/uL — ABNORMAL LOW (ref 3.87–5.11)
RDW: 17.6 % — ABNORMAL HIGH (ref 11.5–15.5)
WBC: 9.4 10*3/uL (ref 4.0–10.5)
nRBC: 0 % (ref 0.0–0.2)

## 2020-09-13 LAB — PREPARE RBC (CROSSMATCH)

## 2020-09-13 LAB — VITAMIN B12: Vitamin B-12: 597 pg/mL (ref 180–914)

## 2020-09-13 MED ORDER — INSULIN ASPART 100 UNIT/ML IJ SOLN
0.0000 [IU] | Freq: Three times a day (TID) | INTRAMUSCULAR | Status: DC
  Administered 2020-09-14: 15 [IU] via SUBCUTANEOUS
  Administered 2020-09-14: 20 [IU] via SUBCUTANEOUS
  Administered 2020-09-15 (×2): 7 [IU] via SUBCUTANEOUS
  Administered 2020-09-16: 15 [IU] via SUBCUTANEOUS
  Administered 2020-09-16: 20 [IU] via SUBCUTANEOUS
  Administered 2020-09-17: 7 [IU] via SUBCUTANEOUS
  Administered 2020-09-18: 3 [IU] via SUBCUTANEOUS

## 2020-09-13 MED ORDER — INSULIN ASPART 100 UNIT/ML IJ SOLN
15.0000 [IU] | Freq: Once | INTRAMUSCULAR | Status: AC
  Administered 2020-09-13: 15 [IU] via SUBCUTANEOUS

## 2020-09-13 MED ORDER — VITAMIN B-12 1000 MCG PO TABS
1000.0000 ug | ORAL_TABLET | Freq: Every day | ORAL | Status: DC
  Administered 2020-09-13 – 2020-09-18 (×6): 1000 ug via ORAL
  Filled 2020-09-13 (×6): qty 1

## 2020-09-13 MED ORDER — FUROSEMIDE 10 MG/ML IJ SOLN
60.0000 mg | Freq: Once | INTRAMUSCULAR | Status: AC
  Administered 2020-09-13: 60 mg via INTRAVENOUS
  Filled 2020-09-13: qty 6

## 2020-09-13 MED ORDER — INSULIN GLARGINE-YFGN 100 UNIT/ML ~~LOC~~ SOLN
9.0000 [IU] | Freq: Two times a day (BID) | SUBCUTANEOUS | Status: DC
  Administered 2020-09-13: 9 [IU] via SUBCUTANEOUS
  Filled 2020-09-13 (×4): qty 0.09

## 2020-09-13 MED ORDER — INSULIN GLARGINE-YFGN 100 UNIT/ML ~~LOC~~ SOLN
20.0000 [IU] | Freq: Every day | SUBCUTANEOUS | Status: DC
  Administered 2020-09-14 – 2020-09-15 (×2): 20 [IU] via SUBCUTANEOUS
  Filled 2020-09-13 (×3): qty 0.2

## 2020-09-13 MED ORDER — ISOSORB DINITRATE-HYDRALAZINE 20-37.5 MG PO TABS
1.0000 | ORAL_TABLET | Freq: Three times a day (TID) | ORAL | Status: DC
  Administered 2020-09-13 – 2020-09-14 (×3): 1 via ORAL
  Filled 2020-09-13 (×3): qty 1

## 2020-09-13 MED ORDER — SODIUM CHLORIDE 0.9% IV SOLUTION: Freq: Once | INTRAVENOUS | Status: AC

## 2020-09-13 MED ORDER — INSULIN ASPART 100 UNIT/ML IJ SOLN
0.0000 [IU] | Freq: Every day | INTRAMUSCULAR | Status: DC
  Administered 2020-09-17: 4 [IU] via SUBCUTANEOUS

## 2020-09-13 MED ORDER — INSULIN ASPART 100 UNIT/ML IJ SOLN
10.0000 [IU] | Freq: Once | INTRAMUSCULAR | Status: AC
  Administered 2020-09-13: 10 [IU] via SUBCUTANEOUS

## 2020-09-13 MED ORDER — FOLIC ACID 1 MG PO TABS
1.0000 mg | ORAL_TABLET | Freq: Every day | ORAL | Status: DC
  Administered 2020-09-13 – 2020-09-18 (×6): 1 mg via ORAL
  Filled 2020-09-13 (×6): qty 1

## 2020-09-13 NOTE — Progress Notes (Signed)
Pt has CBG of 405. MD returned page and said to give 15 stat units Novolog. Another RN gave 15 units Novolog plus 4 for meal coverage. RN will recheck CBG in 30 mins. Pt's CBG recheck was 417. MD to bedside. MD put in stat order for 15 additional units Novolog. RN administered Novolog and will recheck CBG.

## 2020-09-13 NOTE — Plan of Care (Signed)

## 2020-09-13 NOTE — Progress Notes (Signed)
PROGRESS NOTE    Erica Mercer  [id number] DOB: 20-Feb-1955 DOA: 09/05/2020 PCP: [name], NP    No chief complaint on file.   Brief Narrative:  Erica Mercer is a 65 y.o. female with medical history significant of IDDM, HTN, HLD, chronic anemia, cigarette smoker, bipolar disorder, presented with SOB and chest pain.    Underwent left side cardiac cath at [facility]  which showed 90% RCA stenosis with severe calcification, unable to be ballooned.  I recommend patient to be transferred to [facility] for further intervention of RCA Monday.    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, she was transferred to ICU, cardiology managing  Hospitalist manage diabetes care    Subjective:  Hgb dropped to 6.6, she received one prbc transfusion overnight Blood glucose elevated Reports being swollen, more sob, on 3liter oxygen  She walked with Pt this am     Assessment & Plan:   Principal Problem:   NSTEMI (non-ST elevated myocardial infarction) ([place]) Active Problems:   Severe protein-calorie malnutrition ([place])   Essential hypertension   HLD (hyperlipidemia)   COPD (chronic obstructive pulmonary disease) (HCC)   Bipolar 1 disorder (HCC)   Non-ST elevation (NSTEMI) myocardial infarction ([place])   AKI (acute kidney injury) ([place])   Stage 3a chronic kidney disease ([place])   Type II diabetes mellitus with renal manifestations ([place])   Anemia of chronic disease   Grade III hemorrhoids  Non-STEMI In the event of unsuccessful cardiac catheterization on 8/16 detail please refer to original report Currently she has no chest pain Management per cardiology  Hypotension ( developed on 8/16-8/17 night) Per cardiology hypotension likely from above is 1 AV block possibly RV involvement from RCA occlusion Anemia could also contribute Was on dopamine drip briefly, blood  pressure stable off dopamine drip on 8/18 Management per cardiology    Diastolic CHF Report present with bilateral lower extremity edema Volume up today, will give lasix '60mg'$  iv x1 on 8/20 Cardiology following   Acute on chronic anemia Hemoglobin at baseline appear to be between 8 and 9 -had lower gi bleed , likely hemorrhoid bleed, seen by GI  recommend conservative management ---Currently on asa/ brilinta which cardiology prefers to continue  -received prbc transfusion x1 on 8/17, then another unit of 8/20  -iron panel unremarkable, start [id number] and folic supplement  D/c prophylaxis heparin, start scd's  AKI on CKDIV Cr increased, likely from contrast and hypotension Renal US no obstruction,  He denies urinary symptom Avoid nephrotoxin, avoid hypotension Creatinine improved Repeat bmp in am, renal dosing meds   Insulin-dependent type 2 diabetes, uncontrolled, with hyperglycemia A1c 15.4 Appear has been in the hospital multiple times for DKA this year Need diabetes education It appeared long acting insulin was discontinued by night time MD at 2214 on 8/18 evening for unclear reason, I do not see a documentation Her blood glucose is elevated, restart long acting insulin split to bid for now, continue ssi  Constipation Start stool softener  History of asthma No wheezing on exam Qualify for home 02  FTT, start PT  Nutritional Assessment: The patient's BMI is: Body mass index is 22.46 kg/m.Marland Kitchen  Seen by dietician.  I agree with the assessment and plan as outlined below:  Nutrition Status: Nutrition Problem: Severe Malnutrition Etiology: chronic illness Signs/Symptoms: severe fat depletion, severe muscle depletion Interventions: Refer to RD note for recommendations  .    Unresulted  Labs (From admission, onward)     Start     Ordered   09/14/20 0500  CBC  Tomorrow morning,   R       Question:  Specimen collection method  Answer:  Lab=Lab collect   09/13/20 0835    09/14/20 [id number]  Basic metabolic panel  Tomorrow morning,   R       Question:  Specimen collection method  Answer:  Lab=Lab collect   09/13/20 0835   Unscheduled  Occult blood card to lab, stool  As needed,   R      09/11/20 1639              DVT prophylaxis:was on heparin sub Q Place and maintain sequential compression device Start: 09/13/20 1259   Code Status: Full Family Communication: Daughter at bedside on 8/17 Disposition:   Status is: Inpatient   Dispo: The patient is from: Home              Anticipated d/c is to: Possible monday              Anticipated d/c date is: Need cardiology clearance, need hgb to be stable, need blood glucose better controlled , need to monitor volume status                Consultants:  Cardiology GI  Procedures:  Cardiac catheterization  Antimicrobials:   None     Objective: Vitals:   09/13/20 0623 09/13/20 0630 09/13/20 0808 09/13/20 1202  BP:  (!) 166/95 (!) 146/83 (!) 172/94  Pulse: 92 90 84 85  Resp: 20 (!) '23 20 20  '$ Temp: 98 F (36.7 C)  98.6 F (37 C) 97.8 F (36.6 C)  TempSrc: Oral  Oral Oral  SpO2:  93% 96% 95%  Weight:      Height:        Intake/Output Summary (Last 24 hours) at 09/13/2020 1310 Last data filed at 09/13/2020 1235 Gross per 24 hour  Intake 1388 ml  Output --  Net 1388 ml   Filed Weights   09/05/20 1947 09/12/20 0300  Weight: 43 kg 55.7 kg    Examination:  General exam: calm, NAD Respiratory system: Clear to auscultation. Respiratory effort normal. Cardiovascular system: S1 & S2 heard, RRR. Thigh edema Gastrointestinal system: Abdomen is nondistended, soft and nontender. Normal bowel sounds heard. Central nervous system: Alert and oriented. No focal neurological deficits. Extremities: bilateral thigh pitting edema Skin: No rashes, lesions or ulcers Psychiatry: Judgement and insight appear normal. Mood & affect appropriate.     Data Reviewed: I have personally reviewed following labs  and imaging studies  CBC: Recent Labs  Lab 09/09/20 0247 09/10/20 0209 09/11/20 0117 09/12/20 1218 09/13/20 0019  WBC 6.6 9.9 8.9 13.6* 9.4  HGB 7.1* 6.9* 6.7* 7.6* 6.6*  HCT 22.5* 21.7* 20.3* 23.1* 20.7*  MCV 104.7* 104.8* 98.5 100.9* 101.5*  PLT 227 244 198 260 [id number]    Basic Metabolic Panel: Recent Labs  Lab 09/09/20 0247 09/10/20 0209 09/11/20 0117 09/12/20 0054 09/13/20 0019  NA 133* 137 136 136 134*  K 4.6 5.0 4.9 4.7 4.4  CL 104 108 107 108 107  CO2 21* 19* 22 21* 18*  GLUCOSE 386* 290* 209* 171* 248*  BUN 52* 62* 84* 77* 68*  CREATININE 2.14* 1.88* 2.84* 2.13* 1.91*  CALCIUM 8.5* 8.9 8.6* 8.6* 8.3*    GFR: Estimated Creatinine Clearance: 23.2 mL/min (A) (by C-G formula based on SCr of 1.91 mg/dL (H)).  Liver Function Tests: Recent Labs  Lab 09/13/20 0019  AST 87*  ALT 54*  ALKPHOS 162*  BILITOT 0.8  PROT 4.9*  ALBUMIN 2.1*    CBG: Recent Labs  Lab 09/12/20 2120 09/13/20 0638 09/13/20 0643 09/13/20 1127 09/13/20 1227  GLUCAP 301* 458* 436* 405* 417*     Recent Results (from the past 240 hour(s))  SARS CORONAVIRUS 2 (TAT 6-24 HRS) Nasopharyngeal Nasopharyngeal Swab     Status: None   Collection Time: 09/03/20  2:15 PM   Specimen: Nasopharyngeal Swab  Result Value Ref Range Status   SARS Coronavirus 2 NEGATIVE NEGATIVE Final    Comment: (NOTE) SARS-CoV-2 target nucleic acids are NOT DETECTED.  The SARS-CoV-2 RNA is generally detectable in upper and lower respiratory specimens during the acute phase of infection. Negative results do not preclude SARS-CoV-2 infection, do not rule out co-infections with other pathogens, and should not be used as the sole basis for treatment or other patient management decisions. Negative results must be combined with clinical observations, patient history, and epidemiological information. The expected result is Negative.  Fact Sheet for Patients: SugarRoll.be  Fact Sheet  for Healthcare Providers: https://www.woods-mathews.com/  This test is not yet approved or cleared by the [place] FDA and  has been authorized for detection and/or diagnosis of SARS-CoV-2 by FDA under an Emergency Use Authorization (EUA). This EUA will remain  in effect (meaning this test can be used) for the duration of the COVID-19 declaration under Se ction 564(b)(1) of the Act, 21 U.S.C. section 360bbb-3(b)(1), unless the authorization is terminated or revoked sooner.  Performed at [facility], [place] [address] [place], [place] [address]          Radiology Studies: No results found.      Scheduled Meds:  (feeding supplement) PROSource Plus  30 mL Oral BID BM   aspirin EC  81 mg Oral Daily   atorvastatin  40 mg Oral Daily   brexpiprazole  1 mg Oral Daily   Chlorhexidine Gluconate Cloth  6 each Topical Daily   dextrose  25 mL Intravenous Once   famotidine  10 mg Oral QHS   feeding supplement (GLUCERNA SHAKE)  237 mL Oral BID BM   folic acid  1 mg Oral Daily   furosemide  60 mg Intravenous Once   gabapentin  300 mg Oral QHS   hydrocortisone  25 mg Rectal BID   insulin aspart  0-20 Units Subcutaneous TID WC   insulin aspart  0-5 Units Subcutaneous QHS   insulin aspart  0-5 Units Subcutaneous QHS   insulin aspart  15 Units Subcutaneous Once   insulin aspart  4 Units Subcutaneous TID WC   insulin glargine-yfgn  9 Units Subcutaneous BID   isosorbide-hydrALAZINE  1 tablet Oral TID   metoprolol tartrate  25 mg Oral BID   mirtazapine  15 mg Oral QHS   multivitamin with minerals  1 tablet Oral Daily   nicotine  21 mg Transdermal Daily   pantoprazole  40 mg Oral BID   psyllium  1 packet Oral Daily   senna-docusate  1 tablet Oral BID   sodium chloride flush  3 mL Intravenous Q12H   ticagrelor  90 mg Oral BID   vitamin B-12  1,000 mcg Oral Daily   Continuous Infusions:  sodium chloride 50 mL/hr at 09/09/20 1240   sodium chloride       LOS: 8  days   Time spent: 53mns Greater than 50% of this  time was spent in counseling, explanation of diagnosis, planning of further management, and coordination of care.   Voice Recognition Viviann Spare dictation system was used to create this note, attempts have been made to correct errors. Please contact the author with questions and/or clarifications.   Florencia Reasons, MD PhD FACP [facility]  Available via Epic secure chat 7am-7pm for nonurgent issues Please page for urgent issues To page the attending provider between 7A-7P or the covering provider during after hours 7P-7A, please log into the web site [email or web address] and access using universal [place] password for that web site. If you do not have the password, please call the hospital operator.    09/13/2020, 1:10 PM

## 2020-09-13 NOTE — Progress Notes (Addendum)
Subjective:  Patient first seen and examined at the bedside at approximately 8 AM.    Denies chest pain, shortness of breath.  Notably overnight patient's hemoglobin 6.6, therefore patient was transfused 1 unit of packed red blood cells.  Intake/Output from previous day:  I/O last 3 completed shifts: In: 5625 [P.O.:960; I.V.:409; Blood:342] Out: 820 [Urine:820] No intake/output data recorded.  Blood pressure (!) 146/83, pulse 84, temperature 98.6 F (37 C), temperature source Oral, resp. rate 20, height 5' 2"  (1.575 m), weight 55.7 kg, SpO2 96 %.  Physical Exam Vitals reviewed.  Constitutional:      Appearance: She is ill-appearing.     Comments: Frail  Neck:     Vascular: No carotid bruit or JVD.  Cardiovascular:     Rate and Rhythm: Normal rate and regular rhythm.     Pulses: Decreased pulses (pedal pulse).     Heart sounds: Normal heart sounds. No murmur heard.   No gallop.     Comments: Femoral access site tender to palpation.  Dressing intact.  No significant hematoma and no bruit noted. Pulmonary:     Effort: Pulmonary effort is normal.     Breath sounds: Normal breath sounds.  Abdominal:     General: Bowel sounds are normal.     Palpations: Abdomen is soft.  Musculoskeletal:     Right lower leg: No edema.     Left lower leg: No edema.  Skin:    General: Skin is warm and dry.  Neurological:     Mental Status: She is alert.    Lab Results: BMP BNP (last 3 results) Recent Labs    09/03/20 1035  BNP 1,371.8*     ProBNP (last 3 results) No results for input(s): PROBNP in the last 8760 hours. BMP Latest Ref Rng & Units 09/13/2020 09/12/2020 09/11/2020  Glucose 70 - 99 mg/dL 248(H) 171(H) 209(H)  BUN 8 - 23 mg/dL 68(H) 77(H) 84(H)  Creatinine 0.44 - 1.00 mg/dL 1.91(H) 2.13(H) 2.84(H)  Sodium 135 - 145 mmol/L 134(L) 136 136  Potassium 3.5 - 5.1 mmol/L 4.4 4.7 4.9  Chloride 98 - 111 mmol/L 107 108 107  CO2 22 - 32 mmol/L 18(L) 21(L) 22  Calcium 8.9 - 10.3  mg/dL 8.3(L) 8.6(L) 8.6(L)   Hepatic Function Latest Ref Rng & Units 09/13/2020 09/06/2020 09/03/2020  Total Protein 6.5 - 8.1 g/dL 4.9(L) 5.1(L) 6.6  Albumin 3.5 - 5.0 g/dL 2.1(L) 2.4(L) 3.2(L)  AST 15 - 41 U/L 87(H) 109(H) 86(H)  ALT 0 - 44 U/L 54(H) 51(H) 55(H)  Alk Phosphatase 38 - 126 U/L 162(H) 124 143(H)  Total Bilirubin 0.3 - 1.2 mg/dL 0.8 0.4 0.8  Bilirubin, Direct 0.0 - 0.2 mg/dL - <0.1 0.1   CBC Latest Ref Rng & Units 09/13/2020 09/12/2020 09/11/2020  WBC 4.0 - 10.5 K/uL 9.4 13.6(H) 8.9  Hemoglobin 12.0 - 15.0 g/dL 6.6(LL) 7.6(L) 6.7(LL)  Hematocrit 36.0 - 46.0 % 20.7(L) 23.1(L) 20.3(L)  Platelets 150 - 400 K/uL 265 260 198   Lipid Panel     Component Value Date/Time   CHOL 267 (H) 08/21/2020 0432   TRIG 243 (H) 08/21/2020 0432   HDL 65 08/21/2020 0432   CHOLHDL 4.1 08/21/2020 0432   VLDL 49 (H) 08/21/2020 0432   LDLCALC 153 (H) 08/21/2020 0432   Cardiac Panel (last 3 results) Recent Labs    09/12/20 1217  CKTOTAL 298*     HEMOGLOBIN A1C Lab Results  Component Value Date   HGBA1C 15.4 (H) 08/20/2020  MPG 395 08/20/2020   TSH Recent Labs    08/01/20 0126 09/03/20 1320  TSH 1.432 1.029    Imaging: Imaging results have been reviewed  Cardiac Studies:   Echocardiogram 09/06/2020:  1. Left ventricular ejection fraction, by estimation, is 55 to 60%. The  left ventricle has normal function. The left ventricle has no regional  wall motion abnormalities. There is mild left ventricular hypertrophy.  Left ventricular diastolic parameters  are consistent with Grade I diastolic dysfunction (impaired relaxation).   2. Right ventricular systolic function is normal. The right ventricular  size is normal. There is normal pulmonary artery systolic pressure.   3. Left atrial size was mildly dilated.   4. Possible pericardial cyst adjacent to basal inferolateral left  ventricle.   5. No significant valvular abnormality. Normal right atrial pressure.   6. No  significant change compared to previous study on 07/31/2020.    Coronary angiography 09/05/2020 [facility]):   Mid RCA lesion is 99% stenosed.   Prox LAD to Mid LAD lesion is 80% stenosed. (Pesonally reviewed. Appears 80%)   Mid Cx lesion is 40% stenosed.  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  As there was still TIMI I-I 0.5 flow, to prevent retrograde dissection of the ostial RCA into the aorta and also to prevent further antegrade dissection or complete closure of the vessel, a 3.5 x 15 millimeters resolute frontier DES was deployed.  Overall extensive dissection is evident involving the proximal and all the way to the distal RCA with no chance of successful angioplasty.  No devices could not cross after the initial angioplasty at the proximal right coronary artery segment.  1.5 mm balloon could not also cross.  This is in spite of GuideLiner support right from the get go.  Could not pass a heavier wire as well, after initial angioplasty I had attempted to place Iron Man guidewire but I was unable to cross the stenosis probably related to significant calcium and dissection at the focal site.  30 mL of contrast was utilized.  Patient will be admitted to the ICU, I have updated patient's daughter regarding the complication.  Echocardiogram 09/10/2020: 1. Left ventricular ejection fraction, by estimation, is 55 to 60%. Left ventricular ejection fraction by PLAX is 73 %. The left ventricle has normal function. There is moderate left ventricular hypertrophy. Left ventricular diastolic function could not be evaluated. There is mild hypokinesis of the left ventricular, basal inferior wall. 2. Right ventricular systolic function is normal. The right ventricular size is normal. There is normal pulmonary artery systolic pressure. The  estimated right ventricular systolic pressure is 30.1 mmHg. 3. Left atrial size was moderately dilated. 4. Right atrial size was moderately dilated. 5. The mitral valve is normal in structure. Mild mitral valve regurgitation. 6. Tricuspid valve regurgitation is moderate. 7. The aortic valve is normal in structure. Aortic valve regurgitation is not visualized. 8. There is mild (Grade II) plaque involving the ascending aorta. The inferior vena cava is normal in size with <50% respiratory variability, suggesting right atrial pressure of 8 mmHg. 9. Overall no significant change from 09/06/2020.  EKG: 09/04/2020: Sinus rhythm Possible old anteroseptal infarct Inferolateral ST depression  09/10/2020: Mobitz 1 AV block @ 53/min. ST-T abnormality, with T inversion consider anterior ischemia. Compared to prior EKG, heart block new.   09/11/2020: Mobitz 1 AV block.  Normal axis.  ST-T abnormality, consider anterolateral ischemia.  No significant change from prior EKG.  Telemetry: Sinus rhythm, with occasional blocked PACs  Scheduled Meds:  (feeding supplement) PROSource Plus  30 mL Oral BID BM   aspirin EC  81 mg Oral Daily   atorvastatin  40 mg Oral Daily   brexpiprazole  1 mg Oral Daily   Chlorhexidine Gluconate Cloth  6 each Topical Daily   dextrose  25 mL Intravenous Once   famotidine  10 mg Oral QHS   feeding supplement (GLUCERNA SHAKE)  237 mL Oral BID BM   folic acid  1 mg Oral Daily   gabapentin  300 mg Oral QHS   heparin injection (subcutaneous)  5,000 Units Subcutaneous Q8H   hydrocortisone  25 mg Rectal BID   insulin aspart  0-5 Units Subcutaneous QHS   insulin aspart  0-9 Units Subcutaneous TID WC   insulin aspart  4 Units Subcutaneous TID WC   insulin glargine-yfgn  9 Units Subcutaneous BID   isosorbide mononitrate  30 mg Oral Daily   metoprolol tartrate  25 mg Oral BID   mirtazapine  15 mg Oral QHS   multivitamin with minerals  1 tablet Oral Daily   nicotine  21 mg  Transdermal Daily   pantoprazole  40 mg Oral BID   psyllium  1 packet Oral Daily   senna-docusate  1 tablet Oral BID   sodium chloride flush  3 mL Intravenous Q12H   ticagrelor  90 mg Oral BID   vitamin B-12  1,000 mcg Oral Daily   Continuous Infusions:  sodium chloride 50 mL/hr at 09/09/20 1240   sodium chloride     PRN Meds:.sodium chloride, acetaminophen, albuterol, fentaNYL (SUBLIMAZE) injection, metoCLOPramide, morphine injection, nitroGLYCERIN, ondansetron (ZOFRAN) IV, oxyCODONE, polyethylene glycol, sodium chloride flush, witch hazel-glycerin  Assessment/Plan:   Erica Mercer is a 66 y.o. African American female  with hypertension, hyperlipidemia, uncontrolled type 2 DM (A1C 15%), bipolar disorder, NSTEMI presented to [facility] on 09/03/2020 with chest pain and shortness of breath, also found to have severe hyperglycemia, without DKA. HS trop was 237 on 8/10.  EKG showed inferolateral ST depressions. Diagnostic angiogram by Dr. Neoma Laming showed severe calcific 95% mid RCA lesion, and probably non-critical but severe calcific 90% lesion in mid LAD. Dr.Callwood attempted intervention, unsuccessful and hence [facility].  Underwent unsuccessful attempt at angioplasty to RCA on 09/09/2020 with spiral dissection of calcified RCA.  Follow-up echocardiogram unchanged compared to prior before intervention and EKG without new ischemic changes.  NSTEMI Unsuccessful attempt angioplasty 6/44/0347 with complication, spiral dissection of calcified RCA needing stenting to the ostium her to prevent complete closure as well as retrograde spread of dissection to the aorta. Residual calcification 80-85% stenosis in proximal LAD Patient will need repeat cardiac catheterization to reevaluate both LAD stenosis and RCA stenosis, likely plan for this outpatient in 4 to 6 weeks. Repeat Echo following unsuccessful angioplasty attempt revealed no significant wall motion  abnormality or change in LVEF.  Patient likely has significant collaterals. Patient is without chest pain at reexam later this morning.  CAD Continue aspirin and Brilinta Continue atorvastatin Continue Lopressor 25 mg p.o. twice daily Stop Imdur and switch to BiDil 1 tablet p.o. 3 times daily with holding parameters  Hypertension Continue Lopressor 25 mg p.o. twice daily.  Will avoid up titration of beta-blocker therapy given intermittent Mobitz type I block. Switch from Imdur to BiDil 1 tablet p.o. 3 times daily.  Anemia of chronic disease  Hemoglobin 6.6 this morning, requiring 1 unit packed red blood cells Patient's anemia is likely contributing to shortness of breath and chest discomfort intermittently. Will continue to trend GI has evaluated, no active GI bleed.  Suspect hemorrhoidal bleeding to be contributing to anemia. Will defer further management to primary.  CKD, stage IIIb Continue to monitor renal function closely.   Patient was seen in collaboration with Dr. Terri Skains. He also reviewed patient's chart and examined the patient. Dr. Terri Skains is in agreement of the plan.     Alethia Berthold, PA-C 09/13/2020, 9:41 AM Office: [phone]   ADDENDUM:   Shared Visit: Patient was independently interviewed and examined by me.  This has been a shared visit with Lawerance Cruel, PA & Dr. Terri Skains. I reviewed the above and agree the findings and recommendations, unless noted differently below.   Clinically patient is stable from a cardiovascular standpoint.  She was noted to have hemoglobin of 6.6 g/dL.  Underwent 1 unit of blood transfusion.  Hemodynamically remained stable.  Denies chest pain, shortness of breath, orthopnea, paroxysmal nocturnal dyspnea or lower extremity swelling.  PHYSICAL EXAM: Vitals with BMI 09/13/2020 09/13/2020 09/13/2020  Height - - -  Weight - - -  BMI - - -  Systolic 462 703 500  Diastolic 94 83 95  Pulse 85 84 90   CONSTITUTIONAL: Frail in  appearance, hemodynamically stable, no acute distress.  CHEST Normal respiratory effort. No intercostal retractions  LUNGS: Clear to auscultation bilaterally.  No stridor. No wheezes. No rales.  CARDIOVASCULAR: Regular, positive S1-S2, no murmurs rubs or gallops appreciated. ABDOMINAL: Soft, nontender, nondistended, positive bowel sounds in all 4 quadrants no apparent ascites.  EXTREMITIES: Right femoral site well-healed, no hematoma, no bruits, decreased pedal pulses.  No peripheral edema  HEMATOLOGIC: No significant bruising NEUROLOGIC: Oriented to person, place, and time. Nonfocal. Normal muscle tone.  PSYCHIATRIC: Normal mood and affect. Normal behavior. Cooperative   Telemetry reviewed: Normal sinus rhythm without dysrhythmias   Assessment/Plan: Establish coronary artery disease with prior coronary interventions without angina pectoris: Continue dual antiplatelet therapy given the recent proximal RCA stent. Continue Lopressor, will hold off further up titration due to first-degree AV block and Wenckebach phenomena noted on telemetry/EKG. We will transition isosorbide mononitrate to BiDil with holding parameters as discussed above  Non-STEMI: See above  Anemia: Status post 1 packed unit of RBCs. Recommend checking/trending H&H the next 24 to 48 hours Patient does not endorse any evidence of bleeding. Will monitor  Diabetes mellitus type 2, insulin-dependent: Currently being managed by primary team.  Plan of care discussed with the patient, nursing staff, and attending physician.   This note was created using a voice recognition software as a result there may be grammatical errors inadvertently enclosed that do not reflect the nature of this encounter. Every attempt is made to correct such errors.   Rex Kras, [place], [facility]  Pager: [phone] Office: [phone]

## 2020-09-13 NOTE — Progress Notes (Signed)
TRH night shift telemetry coverage note.  The nursing staff reported that the patient's hemoglobin level has decreased from 7.7 to 6.6 g/dL.  She was transfused 1 PRBC earlier during this admission.  I have ordered another PRBC transfusion.  We will continue to monitor the H&H.  Tennis Must, MD

## 2020-09-13 NOTE — Progress Notes (Signed)
CARDIAC REHAB PHASE I   PRE:  Rate/Rhythm: 83 SR    BP: sitting 172/94    SaO2: 93 3L  MODE:  Ambulation: 410 ft   POST:  Rate/Rhythm: 92 SR    BP: sitting 154/91     SaO2: 97 3L  Pt eager to walk. Moves independently but does reach for items impulsively at times. Loves using white rollator from the floor. Ambulated on 3L, rest x1, SOB at end of walk on EOB. BP elevated. Return to bed. [id number]   [place], ACSM 09/13/2020 12:41 PM

## 2020-09-13 NOTE — Progress Notes (Signed)
TRH night shift telemetry coverage note.  The staff reported that the patient has a CBG of 436 mg/dL.  She weights 55.7 kg.  NovoLog 10 units SQ x1 ordered.  Tennis Must, MD

## 2020-09-13 NOTE — Progress Notes (Signed)
Date and time results received: 09/13/20 0242   Test: Hgb.   Critical Value: 6.6  Name of Provider Notified: Olevia Bowens, MD  Orders Received? Or Actions Taken?: Notified of lab value. Awting orders.

## 2020-09-14 DIAGNOSIS — R0609 Other forms of dyspnea: Secondary | ICD-10-CM

## 2020-09-14 DIAGNOSIS — R0600 Dyspnea, unspecified: Secondary | ICD-10-CM

## 2020-09-14 LAB — CBC
HCT: 28.2 % — ABNORMAL LOW (ref 36.0–46.0)
Hemoglobin: 9.2 g/dL — ABNORMAL LOW (ref 12.0–15.0)
MCH: 32.2 pg (ref 26.0–34.0)
MCHC: 32.6 g/dL (ref 30.0–36.0)
MCV: 98.6 fL (ref 80.0–100.0)
Platelets: 295 10*3/uL (ref 150–400)
RBC: 2.86 MIL/uL — ABNORMAL LOW (ref 3.87–5.11)
RDW: 18.3 % — ABNORMAL HIGH (ref 11.5–15.5)
WBC: 10.6 10*3/uL — ABNORMAL HIGH (ref 4.0–10.5)
nRBC: 0 % (ref 0.0–0.2)

## 2020-09-14 LAB — BPAM RBC
Blood Product Expiration Date: 202209072359
Blood Product Expiration Date: 202209132359
ISSUE DATE / TIME: 202208170642
ISSUE DATE / TIME: 202208200316
Unit Type and Rh: 6200
Unit Type and Rh: 6200

## 2020-09-14 LAB — TYPE AND SCREEN
ABO/RH(D): A POS
Antibody Screen: NEGATIVE
Unit division: 0
Unit division: 0

## 2020-09-14 LAB — GLUCOSE, CAPILLARY
Glucose-Capillary: 407 mg/dL — ABNORMAL HIGH (ref 70–99)
Glucose-Capillary: 464 mg/dL — ABNORMAL HIGH (ref 70–99)
Glucose-Capillary: 304 mg/dL — ABNORMAL HIGH (ref 70–99)
Glucose-Capillary: 112 mg/dL — ABNORMAL HIGH (ref 70–99)
Glucose-Capillary: 353 mg/dL — ABNORMAL HIGH (ref 70–99)

## 2020-09-14 LAB — BASIC METABOLIC PANEL
Anion gap: 10 (ref 5–15)
BUN: 56 mg/dL — ABNORMAL HIGH (ref 8–23)
CO2: 22 mmol/L (ref 22–32)
Calcium: 9.1 mg/dL (ref 8.9–10.3)
Chloride: 106 mmol/L (ref 98–111)
Creatinine, Ser: 1.82 mg/dL — ABNORMAL HIGH (ref 0.44–1.00)
GFR, Estimated: 30 mL/min — ABNORMAL LOW (ref >60)
Glucose, Bld: 307 mg/dL — ABNORMAL HIGH (ref 70–99)
Potassium: 3.6 mmol/L (ref 3.5–5.1)
Sodium: 138 mmol/L (ref 135–145)

## 2020-09-14 LAB — BRAIN NATRIURETIC PEPTIDE: B Natriuretic Peptide: 2058.2 pg/mL — ABNORMAL HIGH (ref 0.0–100.0)

## 2020-09-14 MED ORDER — INSULIN GLARGINE-YFGN 100 UNIT/ML ~~LOC~~ SOLN
9.0000 [IU] | Freq: Once | SUBCUTANEOUS | Status: AC
  Administered 2020-09-14: 9 [IU] via SUBCUTANEOUS
  Filled 2020-09-14: qty 0.09

## 2020-09-14 MED ORDER — ISOSORB DINITRATE-HYDRALAZINE 20-37.5 MG PO TABS
2.0000 | ORAL_TABLET | Freq: Three times a day (TID) | ORAL | Status: DC
  Administered 2020-09-14 – 2020-09-18 (×10): 2 via ORAL
  Filled 2020-09-14 (×11): qty 2

## 2020-09-14 MED ORDER — FUROSEMIDE 20 MG PO TABS
10.0000 mg | ORAL_TABLET | Freq: Every day | ORAL | Status: DC
  Administered 2020-09-14: 10 mg via ORAL
  Filled 2020-09-14: qty 1

## 2020-09-14 NOTE — Progress Notes (Addendum)
Subjective:  Denies chest pain, orthopnea, leg swelling.   Reports continued dyspnea on exertion. Presently on supplemental oxygen 3 L/min via nasal cannula Hemoglobin trended up today to 9.2.  Intake/Output from previous day:  I/O last 3 completed shifts: In: 5573 [P.O.:400; I.V.:412; Blood:342] Out: -  Total I/O In: 360 [P.O.:360] Out: -   Blood pressure (!) 160/119, pulse 91, temperature 98 F (36.7 C), temperature source Oral, resp. rate 17, height _0  (1.575 m), weight 54.4 kg, SpO2 95 %.  Physical Exam Vitals reviewed.  Constitutional:      Comments: Frail. Chronically ill-appearing  Neck:     Vascular: No carotid bruit or JVD.  Cardiovascular:     Rate and Rhythm: Normal rate and regular rhythm.     Pulses: Decreased pulses (pedal pulse).     Heart sounds: Normal heart sounds. No murmur heard.   No gallop.     Comments: Femoral access site well healed Pulmonary:     Effort: Pulmonary effort is normal.     Breath sounds: Normal breath sounds.     Comments: Dyspneic with ambulation in room  Abdominal:     General: Bowel sounds are normal.     Palpations: Abdomen is soft.  Musculoskeletal:     Right lower leg: No edema.     Left lower leg: No edema.  Skin:    General: Skin is warm and dry.  Neurological:     Mental Status: She is alert.    Lab Results: BMP BNP (last 3 results) Recent Labs    09/03/20 1035  BNP 1,371.8*     ProBNP (last 3 results) No results for input(s): PROBNP in the last 8760 hours. BMP Latest Ref Rng & Units 09/14/2020 09/13/2020 09/12/2020  Glucose 70 - 99 mg/dL 307(H) 248(H) 171(H)  BUN 8 - 23 mg/dL 56(H) 68(H) 77(H)  Creatinine 0.44 - 1.00 mg/dL 1.82(H) 1.91(H) 2.13(H)  Sodium 135 - 145 mmol/L 138 134(L) 136  Potassium 3.5 - 5.1 mmol/L 3.6 4.4 4.7  Chloride 98 - 111 mmol/L 106 107 108  CO2 22 - 32 mmol/L 22 18(L) 21(L)  Calcium 8.9 - 10.3 mg/dL 9.1 8.3(L) 8.6(L)   Hepatic Function Latest Ref Rng & Units 09/13/2020 09/06/2020  09/03/2020  Total Protein 6.5 - 8.1 g/dL 4.9(L) 5.1(L) 6.6  Albumin 3.5 - 5.0 g/dL 2.1(L) 2.4(L) 3.2(L)  AST 15 - 41 U/L 87(H) 109(H) 86(H)  ALT 0 - 44 U/L 54(H) 51(H) 55(H)  Alk Phosphatase 38 - 126 U/L 162(H) 124 143(H)  Total Bilirubin 0.3 - 1.2 mg/dL 0.8 0.4 0.8  Bilirubin, Direct 0.0 - 0.2 mg/dL - <0.1 0.1   CBC Latest Ref Rng & Units 09/14/2020 09/13/2020 09/12/2020  WBC 4.0 - 10.5 K/uL 10.6(H) 9.4 13.6(H)  Hemoglobin 12.0 - 15.0 g/dL 9.2(L) 6.6(LL) 7.6(L)  Hematocrit 36.0 - 46.0 % 28.2(L) 20.7(L) 23.1(L)  Platelets 150 - 400 K/uL 295 265 260   Lipid Panel     Component Value Date/Time   CHOL 267 (H) 08/21/2020 0432   TRIG 243 (H) 08/21/2020 0432   HDL 65 08/21/2020 0432   CHOLHDL 4.1 08/21/2020 0432   VLDL 49 (H) 08/21/2020 0432   LDLCALC 153 (H) 08/21/2020 0432   Cardiac Panel (last 3 results) Recent Labs    09/12/20 1217  CKTOTAL 298*     HEMOGLOBIN A1C Lab Results  Component Value Date   HGBA1C 15.4 (H) 08/20/2020   MPG 395 08/20/2020   TSH Recent Labs    08/01/20 0126  09/03/20 1320  TSH 1.432 1.029    Imaging: Imaging results have been reviewed  Cardiac Studies:   Echocardiogram 09/06/2020:  1. Left ventricular ejection fraction, by estimation, is 55 to 60%. The  left ventricle has normal function. The left ventricle has no regional  wall motion abnormalities. There is mild left ventricular hypertrophy.  Left ventricular diastolic parameters  are consistent with Grade I diastolic dysfunction (impaired relaxation).   2. Right ventricular systolic function is normal. The right ventricular  size is normal. There is normal pulmonary artery systolic pressure.   3. Left atrial size was mildly dilated.   4. Possible pericardial cyst adjacent to basal inferolateral left  ventricle.   5. No significant valvular abnormality. Normal right atrial pressure.   6. No significant change compared to previous study on 07/31/2020.    Coronary angiography 09/05/2020  [facility]):   Mid RCA lesion is 99% stenosed.   Prox LAD to Mid LAD lesion is 80% stenosed. (Pesonally reviewed. Appears 80%)   Mid Cx lesion is 40% stenosed.  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  As there was still TIMI I-I 0.5 flow, to prevent retrograde dissection of the ostial RCA into the aorta and also to prevent further antegrade dissection or complete closure of the vessel, a 3.5 x 15 millimeters resolute frontier DES was deployed.  Overall extensive dissection is evident involving the proximal and all the way to the distal RCA with no chance of successful angioplasty.  No devices could not cross after the initial angioplasty at the proximal right coronary artery segment.  1.5 mm balloon could not also cross.  This is in spite of GuideLiner support right from the get go.  Could not pass a heavier wire as well, after initial angioplasty I had attempted to place Iron Man guidewire but I was unable to cross the stenosis probably related to significant calcium and dissection at the focal site.  30 mL of contrast was utilized.  Patient will be admitted to the ICU, I have updated patient's daughter regarding the complication.  Echocardiogram 09/10/2020: 1. Left ventricular ejection fraction, by estimation, is 55 to 60%. Left ventricular ejection fraction by PLAX is 73 %. The left ventricle has normal function. There is moderate left ventricular hypertrophy. Left ventricular diastolic function could not be evaluated. There is mild hypokinesis of the left ventricular, basal inferior wall. 2. Right ventricular systolic function is normal. The right ventricular size is normal. There is normal pulmonary artery systolic pressure. The estimated right ventricular systolic pressure is 09.9 mmHg. 3. Left atrial size was moderately  dilated. 4. Right atrial size was moderately dilated. 5. The mitral valve is normal in structure. Mild mitral valve regurgitation. 6. Tricuspid valve regurgitation is moderate. 7. The aortic valve is normal in structure. Aortic valve regurgitation is not visualized. 8. There is mild (Grade II) plaque involving the ascending aorta. The inferior vena cava is normal in size with <50% respiratory variability, suggesting right atrial pressure of 8 mmHg. 9. Overall no significant change from 09/06/2020.  EKG: 09/04/2020: Sinus rhythm Possible old anteroseptal infarct Inferolateral ST depression  09/10/2020: Mobitz 1 AV block @ 53/min. ST-T abnormality, with T inversion consider anterior ischemia. Compared to prior EKG, heart block new.   09/11/2020: Mobitz 1 AV block.  Normal axis.  ST-T abnormality, consider anterolateral ischemia.  No significant change from prior EKG.  Telemetry: Normal sinus rhythm, PVC.  No Mobitz 1 AV block  Scheduled Meds:  (feeding supplement) PROSource Plus  30 mL Oral BID BM   aspirin EC  81 mg Oral Daily   atorvastatin  40 mg Oral Daily   brexpiprazole  1 mg Oral Daily   dextrose  25 mL Intravenous Once   famotidine  10 mg Oral QHS   feeding supplement (GLUCERNA SHAKE)  237 mL Oral BID BM   folic acid  1 mg Oral Daily   gabapentin  300 mg Oral QHS   hydrocortisone  25 mg Rectal BID   insulin aspart  0-20 Units Subcutaneous TID WC   insulin aspart  0-5 Units Subcutaneous QHS   insulin aspart  0-5 Units Subcutaneous QHS   insulin aspart  4 Units Subcutaneous TID WC   insulin glargine-yfgn  20 Units Subcutaneous QHS   isosorbide-hydrALAZINE  1 tablet Oral TID   metoprolol tartrate  25 mg Oral BID   mirtazapine  15 mg Oral QHS   multivitamin with minerals  1 tablet Oral Daily   nicotine  21 mg Transdermal Daily   pantoprazole  40 mg Oral BID   psyllium  1 packet Oral Daily   senna-docusate  1 tablet Oral BID   sodium chloride flush  3 mL Intravenous Q12H    ticagrelor  90 mg Oral BID   vitamin B-12  1,000 mcg Oral Daily   Continuous Infusions:  sodium chloride     PRN Meds:.sodium chloride, acetaminophen, albuterol, fentaNYL (SUBLIMAZE) injection, metoCLOPramide, morphine injection, nitroGLYCERIN, ondansetron (ZOFRAN) IV, oxyCODONE, polyethylene glycol, sodium chloride flush, witch hazel-glycerin  Assessment/Plan:   Erica Mercer is a 65 y.o. African American female  with hypertension, hyperlipidemia, uncontrolled type 2 DM (A1C 15%), bipolar disorder, NSTEMI presented to [facility] on 09/03/2020 with chest pain and shortness of breath, also found to have severe hyperglycemia, without DKA. HS trop was 237 on 8/10.  EKG showed inferolateral ST depressions. Diagnostic angiogram by Dr. Neoma Laming showed severe calcific 95% mid RCA lesion, and probably non-critical but severe calcific 90% lesion in mid LAD. Dr.Callwood attempted intervention, unsuccessful and hence transferred to [facility].  Underwent unsuccessful attempt at angioplasty to RCA on 09/09/2020 with spiral dissection of calcified RCA.  Follow-up echocardiogram unchanged compared to prior before intervention and EKG without new ischemic changes.  Hypertension Continue Lopressor 25 mg p.o. twice daily. Increase BiDil as above  Dyspnea on exertion:  Will obtain BNP with repeat BNP tomorrow  Start Lasix 10 mg p.o. daily  Monitor renal fucntion  Strict I&Os, daily weights   NSTEMI Unsuccessful attempt angioplasty 05/27/8880 with complication, spiral dissection of calcified RCA needing stenting to the ostium her to prevent complete closure as well as retrograde spread of dissection to the aorta. Residual calcification 80-85% stenosis in proximal LAD Patient will need repeat cardiac catheterization to reevaluate both LAD stenosis and RCA stenosis, likely plan for this outpatient in 4 to 6 weeks. Repeat Echo following unsuccessful angioplasty attempt revealed no significant  wall motion abnormality or change in LVEF.  Patient likely has significant collaterals. Patient is chest pain-free  Establish coronary artery disease with prior coronary interventions without angina pectoris: Continue dual antiplatelet therapy given the recent proximal RCA stent. Continue Lopressor, will hold off further up titration due to first-degree AV block and Wenckebach phenomena noted on telemetry/EKG. Continue atorvastatin Increase BiDil from 1 tablet to 2 tablets p.o. 3 times daily.  Hold BiDil for systolic blood pressure <800  mmHg Chest pain free but remains dyspneic with minimal exertion.   Intermittent Mobitz 1 AV block No recurrence on telemetry over the last 24 hours  Anemia Status post total 2 unit packed red blood cells (8/17 and 8/20) Hemoglobin improved to 9.2 today Anemia is likely contributing to patient's dyspnea on exertion. Will defer further management to primary team, continue to monitor  CKD, stage IIIb Continue to monitor renal function closely.  Diabetes mellitus type 2, insulin-dependent: Currently being managed by primary team.  Patient was seen in collaboration with Dr. Terri Skains. He also reviewed patient's chart and examined the patient. Dr. Terri Skains is in agreement of the plan.    Alethia Berthold, PA-C 09/14/2020, 11:29 AM Office: [phone]  ADDENDUM:   Shared Visit: Patient was independently interviewed and examined by me. This has been a shared visit with Lawerance Cruel, PA & Dr. Terri Skains. I reviewed the above and agree the findings and recommendations, unless noted differently below.   She denies chest pain at rest or with effort related activities. Continues to have shortness of breath with minimal activity. Denies evidence of bleeding. Eager to go home.  PHYSICAL EXAM: Vitals with BMI 09/14/2020 09/14/2020 09/14/2020  Height - - -  Weight - - 119 lbs 15 oz  BMI - - 54.86  Systolic 282 417 -  Diastolic 530 82 -  Pulse 91 87 86   CHEST  Normal respiratory effort. No intercostal retractions  LUNGS: Clear to auscultation bilaterally.  No stridor. No wheezes. No rales.  CARDIOVASCULAR: Regular, positive S1-S2 no murmurs rubs or gallops appreciated. ABDOMINAL: Soft, nontender, nondistended, positive bowel sounds in all 4 quadrants, no apparent ascites.  EXTREMITIES: Right femoral site is well-healed, no hematoma, no bruit.  No peripheral edema   Telemetry independently reviewed: Normal sinus rhythm   Assessment/Plan: Hypertension with chronic kidney disease: Will increase BiDil 20/37.5 mg 2 tablets twice daily Start Lasix 10 mg p.o. daily -close monitoring of serum creatinine Monitor blood pressures.  Dyspnea on exertion: Noted to have dyspnea with minimal exertion within her room. Up titration of antihypertensive medications. Will start low-dose furosemide. Continue to monitor  Anemia: As noted above status post 2 units of packed red blood cells since hospitalization. Morning hemoglobin uptrending. Recommend monitoring for another 24 hours. Does not endorse bleeding  Further recommendations to follow as the case evolves.   This note was created using a voice recognition software as a result there may be grammatical errors inadvertently enclosed that do not reflect the nature of this encounter. Every attempt is made to correct such errors.   Rex Kras, [place], [facility]  Pager: [phone] Office: [phone]

## 2020-09-14 NOTE — Progress Notes (Addendum)
PROGRESS NOTE    Erica Mercer  [id number] DOB: [birth year] DOA: 09/05/2020 PCP: [name], NP    No chief complaint on file.   Brief Narrative:  Erica Mercer is a 65 y.o. female with medical history significant of IDDM, HTN, HLD, chronic anemia, cigarette smoker, bipolar disorder, presented with SOB and chest pain.    Underwent left side cardiac cath at [facility]  which showed 90% RCA stenosis with severe calcification, unable to be ballooned.  I recommend patient to be transferred to [facility] for further intervention of RCA Monday.    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, she was transferred to ICU, cardiology managing  Hospitalist manage diabetes care    Subjective:  Hgb appears stable in the last 24hr  Blood glucose elevated Urine output was not documented but patient states she peed a lot after one does iv lasix on 8/20 her thigh and feet edema has improved, but reports weight is still above her base weight      Assessment & Plan:   Principal Problem:   NSTEMI (non-ST elevated myocardial infarction) ([place]) Active Problems:   Severe protein-calorie malnutrition ([place])   Essential hypertension   HLD (hyperlipidemia)   COPD (chronic obstructive pulmonary disease) (HCC)   Bipolar 1 disorder (HCC)   Non-ST elevation (NSTEMI) myocardial infarction ([place])   AKI (acute kidney injury) ([place])   Stage 3a chronic kidney disease ([place])   Type II diabetes mellitus with renal manifestations ([place])   Anemia of chronic disease   Grade III hemorrhoids   Atherosclerosis of native coronary artery of native heart without angina pectoris  Non-STEMI In the event of unsuccessful cardiac catheterization on 8/16 detail please refer to original report Currently she has no chest pain Management per cardiology  Hypotension ( developed on 8/16-8/17 night) Per  cardiology hypotension likely from above is 1 AV block possibly RV involvement from RCA occlusion Anemia could also contribute Was on dopamine drip briefly, blood pressure stable off dopamine drip since 8/18, now blood pressure start to trend up Management per cardiology    Diastolic CHF Report present with bilateral lower extremity edema Volume up on 8/20, received  lasix '60mg'$  iv x1 on 8/20, respond well, she reports her weight is still above her base weight though, will need to reassess volume, may need prn lasix at discharge Cardiology following   Acute on chronic anemia Hemoglobin at baseline appear to be between 8 and 9 -had lower gi bleed , likely hemorrhoid bleed, seen by GI  recommend conservative management ---Currently on asa/ brilinta which cardiology prefers to continue  -received prbc transfusion x1 on 8/17, then another unit of 8/20  -iron panel unremarkable, started [id number] and folic supplement  D/c prophylaxis heparin, start scd's  AKI on CKDIV Cr increased, likely from contrast and hypotension Renal US no obstruction,  He denies urinary symptom Avoid nephrotoxin, avoid hypotension Creatinine improved Repeat bmp in am, renal dosing meds   Insulin-dependent type 2 diabetes, uncontrolled, with hyperglycemia A1c 15.4 Appear has been in the hospital multiple times for DKA this year Need diabetes education It appeared long acting insulin was discontinued by night time MD at 2214 on 8/18 evening for unclear reason, I do not see any documentation Her blood glucose is elevated, restart long acting insulin split to bid for now, continue ssi Continue adjust insulin She will benefit from continuous blood glucose monitoring at home, I have  advised her to have her pcp refer her to endocrinologist  Constipation Start stool softener  History of asthma No wheezing on exam Qualify for home 02  FTT, pt recommend home health PT  Nutritional Assessment: The patient's BMI is:  Body mass index is 21.94 kg/m.Marland Kitchen  Seen by dietician.  I agree with the assessment and plan as outlined below:  Nutrition Status: Nutrition Problem: Severe Malnutrition Etiology: chronic illness Signs/Symptoms: severe fat depletion, severe muscle depletion Interventions: Refer to RD note for recommendations  .    Unresulted Labs (From admission, onward)     Start     Ordered   Unscheduled  Occult blood card to lab, stool  As needed,   R      09/11/20 1639              DVT prophylaxis:was on heparin sub Q Place and maintain sequential compression device Start: 09/13/20 1259   Code Status: Full Family Communication: Daughter at bedside on 8/17 Disposition:   Status is: Inpatient   Dispo: The patient is from: Home              Anticipated d/c is to: monday              Anticipated d/c date is: Need cardiology clearance, need hgb to be stable, need blood glucose better controlled , need to monitor volume status, will benefit from transition care pharmacy, will need home equipment and home 02                Consultants:  Cardiology GI  Procedures:  Cardiac catheterization  Antimicrobials:   None     Objective: Vitals:   09/14/20 0626 09/14/20 0913 09/14/20 1039 09/14/20 1127  BP:   (!) 149/82 (!) 160/119  Pulse:   87 91  Resp: 20   17  Temp:  97.8 F (36.6 C)  98 F (36.7 C)  TempSrc:  Oral  Oral  SpO2:    95%  Weight:      Height:        Intake/Output Summary (Last 24 hours) at 09/14/2020 1338 Last data filed at 09/14/2020 1000 Gross per 24 hour  Intake 376 ml  Output --  Net 376 ml   Filed Weights   09/05/20 1947 09/12/20 0300 09/14/20 0300  Weight: 43 kg 55.7 kg 54.4 kg    Examination:  General exam: calm, NAD Respiratory system: Clear to auscultation. Respiratory effort normal. Cardiovascular system: S1 & S2 heard, RRR. Thigh edema Gastrointestinal system: Abdomen is nondistended, soft and nontender. Normal bowel sounds  heard. Central nervous system: Alert and oriented. No focal neurological deficits. Extremities: bilateral thigh pitting edema has improved Skin: No rashes, lesions or ulcers Psychiatry: Judgement and insight appear normal. Mood & affect appropriate.     Data Reviewed: I have personally reviewed following labs and imaging studies  CBC: Recent Labs  Lab 09/10/20 0209 09/11/20 0117 09/12/20 1218 09/13/20 0019 09/14/20 0807  WBC 9.9 8.9 13.6* 9.4 10.6*  HGB 6.9* 6.7* 7.6* 6.6* 9.2*  HCT 21.7* 20.3* 23.1* 20.7* 28.2*  MCV 104.8* 98.5 100.9* 101.5* 98.6  PLT 244 198 260 265 [id number]    Basic Metabolic Panel: Recent Labs  Lab 09/10/20 0209 09/11/20 0117 09/12/20 0054 09/13/20 0019 09/14/20 0807  NA 137 136 136 134* 138  K 5.0 4.9 4.7 4.4 3.6  CL 108 107 108 107 106  CO2 19* 22 21* 18* 22  GLUCOSE 290* 209* 171* 248* 307*  BUN 62*  84* 77* 68* 56*  CREATININE 1.88* 2.84* 2.13* 1.91* 1.82*  CALCIUM 8.9 8.6* 8.6* 8.3* 9.1    GFR: Estimated Creatinine Clearance: 24.4 mL/min (A) (by C-G formula based on SCr of 1.82 mg/dL (H)).  Liver Function Tests: Recent Labs  Lab 09/13/20 0019  AST 87*  ALT 54*  ALKPHOS 162*  BILITOT 0.8  PROT 4.9*  ALBUMIN 2.1*    CBG: Recent Labs  Lab 09/13/20 1616 09/13/20 2105 09/14/20 0626 09/14/20 0629 09/14/20 1104  GLUCAP 106* 286* 464* 407* 304*     No results found for this or any previous visit (from the past 240 hour(s)).        Radiology Studies: No results found.      Scheduled Meds:  (feeding supplement) PROSource Plus  30 mL Oral BID BM   aspirin EC  81 mg Oral Daily   atorvastatin  40 mg Oral Daily   brexpiprazole  1 mg Oral Daily   dextrose  25 mL Intravenous Once   famotidine  10 mg Oral QHS   feeding supplement (GLUCERNA SHAKE)  237 mL Oral BID BM   folic acid  1 mg Oral Daily   gabapentin  300 mg Oral QHS   hydrocortisone  25 mg Rectal BID   insulin aspart  0-20 Units Subcutaneous TID WC   insulin  aspart  0-5 Units Subcutaneous QHS   insulin aspart  0-5 Units Subcutaneous QHS   insulin aspart  4 Units Subcutaneous TID WC   insulin glargine-yfgn  20 Units Subcutaneous QHS   isosorbide-hydrALAZINE  1 tablet Oral TID   metoprolol tartrate  25 mg Oral BID   mirtazapine  15 mg Oral QHS   multivitamin with minerals  1 tablet Oral Daily   nicotine  21 mg Transdermal Daily   pantoprazole  40 mg Oral BID   psyllium  1 packet Oral Daily   senna-docusate  1 tablet Oral BID   sodium chloride flush  3 mL Intravenous Q12H   ticagrelor  90 mg Oral BID   vitamin B-12  1,000 mcg Oral Daily   Continuous Infusions:  sodium chloride       LOS: 9 days   Time spent: 67mns Greater than 50% of this time was spent in counseling, explanation of diagnosis, planning of further management, and coordination of care.   Voice Recognition /Viviann Sparedictation system was used to create this note, attempts have been made to correct errors. Please contact the author with questions and/or clarifications.   FFlorencia Reasons MD PhD FACP [facility]  Available via Epic secure chat 7am-7pm for nonurgent issues Please page for urgent issues To page the attending provider between 7A-7P or the covering provider during after hours 7P-7A, please log into the web site [email or web address] and access using universal [place] password for that web site. If you do not have the password, please call the hospital operator.    09/14/2020, 1:38 PM

## 2020-09-15 ENCOUNTER — Inpatient Hospital Stay (HOSPITAL_COMMUNITY): Payer: Medicare Other

## 2020-09-15 DIAGNOSIS — K642 Third degree hemorrhoids: Secondary | ICD-10-CM

## 2020-09-15 LAB — CBC
HCT: 22.7 % — ABNORMAL LOW (ref 36.0–46.0)
Hemoglobin: 7.6 g/dL — ABNORMAL LOW (ref 12.0–15.0)
MCH: 32.8 pg (ref 26.0–34.0)
MCHC: 33.5 g/dL (ref 30.0–36.0)
MCV: 97.8 fL (ref 80.0–100.0)
Platelets: 291 10*3/uL (ref 150–400)
RBC: 2.32 MIL/uL — ABNORMAL LOW (ref 3.87–5.11)
RDW: 17.8 % — ABNORMAL HIGH (ref 11.5–15.5)
WBC: 9.4 10*3/uL (ref 4.0–10.5)
nRBC: 0 % (ref 0.0–0.2)

## 2020-09-15 LAB — BRAIN NATRIURETIC PEPTIDE
B Natriuretic Peptide: 2034.8 pg/mL — ABNORMAL HIGH (ref 0.0–100.0)
B Natriuretic Peptide: 1996.1 pg/mL — ABNORMAL HIGH (ref 0.0–100.0)

## 2020-09-15 LAB — GLUCOSE, CAPILLARY
Glucose-Capillary: 250 mg/dL — ABNORMAL HIGH (ref 70–99)
Glucose-Capillary: 72 mg/dL (ref 70–99)
Glucose-Capillary: 224 mg/dL — ABNORMAL HIGH (ref 70–99)
Glucose-Capillary: 162 mg/dL — ABNORMAL HIGH (ref 70–99)

## 2020-09-15 LAB — MAGNESIUM: Magnesium: 1.9 mg/dL (ref 1.7–2.4)

## 2020-09-15 MED ORDER — CLOPIDOGREL BISULFATE 75 MG PO TABS
75.0000 mg | ORAL_TABLET | Freq: Every day | ORAL | Status: DC
  Administered 2020-09-15 – 2020-09-18 (×4): 75 mg via ORAL
  Filled 2020-09-15 (×4): qty 1

## 2020-09-15 MED ORDER — FUROSEMIDE 10 MG/ML IJ SOLN
40.0000 mg | Freq: Two times a day (BID) | INTRAMUSCULAR | Status: DC
  Administered 2020-09-15 – 2020-09-16 (×2): 40 mg via INTRAVENOUS
  Filled 2020-09-15 (×2): qty 4

## 2020-09-15 MED ORDER — POTASSIUM CHLORIDE CRYS ER 10 MEQ PO TBCR
10.0000 meq | EXTENDED_RELEASE_TABLET | Freq: Two times a day (BID) | ORAL | Status: DC
  Administered 2020-09-15 – 2020-09-18 (×6): 10 meq via ORAL
  Filled 2020-09-15 (×6): qty 1

## 2020-09-15 MED ORDER — PREDNISONE 20 MG PO TABS
40.0000 mg | ORAL_TABLET | Freq: Every day | ORAL | Status: DC
  Administered 2020-09-15: 40 mg via ORAL
  Filled 2020-09-15: qty 2

## 2020-09-15 MED ORDER — FUROSEMIDE 10 MG/ML IJ SOLN: 40.0000 mg | Freq: Once | INTRAMUSCULAR | Status: DC

## 2020-09-15 MED ORDER — FUROSEMIDE 10 MG/ML IJ SOLN: 20.0000 mg | Freq: Two times a day (BID) | INTRAMUSCULAR | Status: DC

## 2020-09-15 MED ORDER — IPRATROPIUM-ALBUTEROL 0.5-2.5 (3) MG/3ML IN SOLN
3.0000 mL | Freq: Four times a day (QID) | RESPIRATORY_TRACT | Status: DC
  Administered 2020-09-15 (×2): 3 mL via RESPIRATORY_TRACT
  Filled 2020-09-15: qty 3

## 2020-09-15 MED ORDER — IPRATROPIUM-ALBUTEROL 0.5-2.5 (3) MG/3ML IN SOLN
RESPIRATORY_TRACT | Status: AC
  Filled 2020-09-15: qty 3

## 2020-09-15 MED ORDER — METOPROLOL TARTRATE 50 MG PO TABS
50.0000 mg | ORAL_TABLET | Freq: Two times a day (BID) | ORAL | Status: DC
  Administered 2020-09-15 – 2020-09-17 (×5): 50 mg via ORAL
  Filled 2020-09-15 (×5): qty 1

## 2020-09-15 NOTE — Progress Notes (Signed)
PROGRESS NOTE    Erica Mercer  [id number] DOB: 1955-12-01 DOA: 09/05/2020 PCP: [name], NP   Chief Complain:  Brief Narrative: Patient is a 49 old female with history of insulin-dependent diabetes, hypertension, hyperlipidemia, chronic anemia, cigarette smoker, bipolar disorder who presented with shortness of breath and chest pain.  She underwent left heart cath at [facility] which showed 90% RCAl stenosis with severe calcification, unable to be ballooned and was  transferred to [facility] for further intervention of RCA.  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  Hospital course also remarkable for anemia requiring blood transfusion suspected from hemorrhoids for which GI recommended conservative management.  Assessment & Plan:   Principal Problem:   NSTEMI (non-ST elevated myocardial infarction) ([place]) Active Problems:   Severe protein-calorie malnutrition (HCC)   Essential hypertension   HLD (hyperlipidemia)   COPD (chronic obstructive pulmonary disease) (HCC)   Bipolar 1 disorder (HCC)   Non-ST elevation (NSTEMI) myocardial infarction ([place])   AKI (acute kidney injury) ([place])   Stage 3a chronic kidney disease (HCC)   Type II diabetes mellitus with renal manifestations (HCC)   Anemia of chronic disease   Grade III hemorrhoids   Atherosclerosis of native coronary artery of native heart without angina pectoris   Dyspnea on exertion   NSTEMI:  She underwent left heart cath at [facility] which showed 90% RCAl stenosis with severe calcification, unable to be ballooned and was  transferred to [facility] for further intervention of RCA.  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  Cardiology following.  Currently on aspirin and  Brilinta.  Continue metoprolol. Cardiology recommending repeat cardiac catheter to reevaluate both LAD stenosis and RCA stenosis as an outpatient in 4 to 6 weeks.  Currently chest pain-free  Lower GI bleed/acute on chronic normocytic: Suspected to be from hemorrhoids.  She required blood transfusion during this hospitalization.  Hemoglobin dropped from the range of 9 to 7 today.  We will continue to monitor H&H.  GI was consulted in this admission, recommended conservative management  Diastolic congestive heart failure/dyspnea on exertion: She presented with bilateral lower extremity edema.  Severe elevated BNP.  Cardiology recommended to continue Lasix.  Monitor input/output, daily weight  Hypertension: Currently stable.  She also required brief dopamine infusion.  AKI on CKD stage 3b: Renal ultrasound did not show any obstruction.  Avoid nephrotoxins.  Currently kidney function has improved and is at baseline.  Insulin-dependent diabetes type 2: Uncontrolled with hemoglobin A1c of 15.4.  Has been admitted multiple times for DKA this year.  Continue to monitor blood sugars.  Continue current insulin regimen.  She is to follow-up with her PCP closely/endocrinologist.  History of CopD: Found to be wheezing today.  Currently on 3 to 4 L of oxygen per minute.  Will try to taper the oxygen and monitor her room air.  She is not on oxygen at home.  Started on prednisone, DuoNeb  Constipation: Continue bowel regimen  Debility/deconditioning: Patient seen by PT/OT recommended home health on discharge     Nutrition Problem: Severe Malnutrition Etiology: chronic illness      DVT prophylaxis:SCD Code Status: Full Family Communication: None at bedside Status is: Inpatient  Remains inpatient appropriate because:Inpatient level of care appropriate due to severity of illness  Dispo: The patient is from: Home  Anticipated d/c is to: Home              Patient currently is not  medically stable to d/c.   Difficult to place patient No     Consultants:  Cardiology  Procedures: Cardiac cath  Antimicrobials:  Anti-infectives (From admission, onward)    Start     Dose/Rate Route Frequency Ordered Stop   09/09/20 1448  ceFAZolin (ANCEF) IVPB 2 g/50 mL premix        over 30 Minutes  Continuous PRN 09/09/20 1448 09/09/20 1448       Subjective: Patient seen and examined at bedside this afternoon.  Hemodynamically stable during my evaluation, on 3 to 4 L of oxygen per minute.  Not happy because she is not able to go home today.  She was coughing, wheezing auscultated on examination.  Does not report any hematochezia or melena.  Objective: Vitals:   09/15/20 0100 09/15/20 0840 09/15/20 1138 09/15/20 1158  BP:  (!) 186/94 (!) 144/76   Pulse:  89  94  Resp:  20 20   Temp:  98.6 F (37 C) 98.7 F (37.1 C)   TempSrc:  Oral Oral   SpO2:  93% 98%   Weight: 52.8 kg     Height:        Intake/Output Summary (Last 24 hours) at 09/15/2020 1301 Last data filed at 09/15/2020 [id number] Gross per 24 hour  Intake 729 ml  Output --  Net 729 ml   Filed Weights   09/12/20 0300 09/14/20 0300 09/15/20 0100  Weight: 55.7 kg 54.4 kg 52.8 kg    Examination:  General exam: Overall comfortable, not in distress,pleasant female HEENT: PERRL Respiratory system: Bilateral expiratory wheezes Cardiovascular system: S1 & S2 heard, RRR.  Gastrointestinal system: Abdomen is nondistended, soft and nontender. Central nervous system: Alert and oriented Extremities: No edema, no clubbing ,no cyanosis Skin: No rashes, no ulcers,no icterus     Data Reviewed: I have personally reviewed following labs and imaging studies  CBC: Recent Labs  Lab 09/11/20 0117 09/12/20 1218 09/13/20 0019 09/14/20 0807 09/15/20 0029  WBC 8.9 13.6* 9.4 10.6* 9.4  HGB 6.7* 7.6* 6.6* 9.2* 7.6*  HCT 20.3* 23.1* 20.7* 28.2* 22.7*  MCV 98.5 100.9* 101.5* 98.6 97.8  PLT 198 260 265 295 [id number]   Basic  Metabolic Panel: Recent Labs  Lab 09/10/20 0209 09/11/20 0117 09/12/20 0054 09/13/20 0019 09/14/20 0807 09/15/20 0029  NA 137 136 136 134* 138  --   K 5.0 4.9 4.7 4.4 3.6  --   CL 108 107 108 107 106  --   CO2 19* 22 21* 18* 22  --   GLUCOSE 290* 209* 171* 248* 307*  --   BUN 62* 84* 77* 68* 56*  --   CREATININE 1.88* 2.84* 2.13* 1.91* 1.82*  --   CALCIUM 8.9 8.6* 8.6* 8.3* 9.1  --   MG  --   --   --   --   --  1.9   GFR: Estimated Creatinine Clearance: 24.4 mL/min (A) (by C-G formula based on SCr of 1.82 mg/dL (H)). Liver Function Tests: Recent Labs  Lab 09/13/20 0019  AST 87*  ALT 54*  ALKPHOS 162*  BILITOT 0.8  PROT 4.9*  ALBUMIN 2.1*   No results for input(s): LIPASE, AMYLASE in the last 168 hours. No results for input(s): AMMONIA in the last 168 hours. Coagulation Profile: No results for input(s): INR, PROTIME in the last 168 hours. Cardiac Enzymes: Recent  Labs  Lab 09/12/20 1217  CKTOTAL 298*   BNP (last 3 results) No results for input(s): PROBNP in the last 8760 hours. HbA1C: No results for input(s): HGBA1C in the last 72 hours. CBG: Recent Labs  Lab 09/14/20 1104 09/14/20 1602 09/14/20 2149 09/15/20 0642 09/15/20 1106  GLUCAP 304* 112* 353* 250* 72   Lipid Profile: No results for input(s): CHOL, HDL, LDLCALC, TRIG, CHOLHDL, LDLDIRECT in the last 72 hours. Thyroid Function Tests: No results for input(s): TSH, T4TOTAL, FREET4, T3FREE, THYROIDAB in the last 72 hours. Anemia Panel: Recent Labs    09/13/20 0019  VITAMINB12 597   Sepsis Labs: Recent Labs  Lab 09/11/20 0117  LATICACIDVEN 1.3    No results found for this or any previous visit (from the past 240 hour(s)).       Radiology Studies: No results found.      Scheduled Meds:  (feeding supplement) PROSource Plus  30 mL Oral BID BM   aspirin EC  81 mg Oral Daily   atorvastatin  40 mg Oral Daily   brexpiprazole  1 mg Oral Daily   dextrose  25 mL Intravenous Once    famotidine  10 mg Oral QHS   feeding supplement (GLUCERNA SHAKE)  237 mL Oral BID BM   folic acid  1 mg Oral Daily   furosemide  20 mg Intravenous BID   gabapentin  300 mg Oral QHS   hydrocortisone  25 mg Rectal BID   insulin aspart  0-20 Units Subcutaneous TID WC   insulin aspart  0-5 Units Subcutaneous QHS   insulin aspart  0-5 Units Subcutaneous QHS   insulin aspart  4 Units Subcutaneous TID WC   insulin glargine-yfgn  20 Units Subcutaneous QHS   isosorbide-hydrALAZINE  2 tablet Oral TID   metoprolol tartrate  50 mg Oral BID   mirtazapine  15 mg Oral QHS   multivitamin with minerals  1 tablet Oral Daily   nicotine  21 mg Transdermal Daily   pantoprazole  40 mg Oral BID   psyllium  1 packet Oral Daily   senna-docusate  1 tablet Oral BID   sodium chloride flush  3 mL Intravenous Q12H   ticagrelor  90 mg Oral BID   vitamin B-12  1,000 mcg Oral Daily   Continuous Infusions:  sodium chloride       LOS: 10 days    Time spent:35 mins. More than 50% of that time was spent in counseling and/or coordination of care.      Shelly Coss, MD [facility] P8/22/2022, 1:01 PM

## 2020-09-15 NOTE — Plan of Care (Signed)

## 2020-09-15 NOTE — Progress Notes (Signed)
CARDIAC REHAB PHASE I   PRE:  Rate/Rhythm: 98 SR  BP:  Supine: 159/78  Sitting:   Standing:    SaO2: 93% 4L  MODE:  Ambulation: 340 ft   POST:  Rate/Rhythm: 106 ST  BP:  Supine: 180/95,  171/83  Sitting:   Standing:    SaO2: 86%RA  91% 3L 1345-1435 Came at one o'clock and pt sleeping. Returned now and pt awake and willing to walk. Pt needed 3L to keep sats up walking. Wrote qualifying note for oxygen. She walked 340 ft on 3L with rollator and sat once. Slightly SOB. No CP. Back to bed after walk. BP elevated after walk but better with rest.  Pt would like rollator for home use and needed 3L when walking to keep sats up.  Graylon Good, RN BSN  09/15/2020 2:30 PM

## 2020-09-15 NOTE — Progress Notes (Signed)
PT Cancellation Note  Patient Details Name: Erica Mercer MRN: [id number] DOB: [birth year]   Cancelled Treatment:    Reason Eval/Treat Not Completed: Other (comment). Attempted to see pt this morning however pt upset about not going home today. Attempted to see pt this afternoon and pt finally resting, RN asked to let pt rest for a little bit. PT to return as able to complete PT treatment.  Kittie Plater, PT, DPT Acute Rehabilitation Services Pager #: [phone] Office #: [phone]    Berline Lopes 09/15/2020, 1:48 PM

## 2020-09-15 NOTE — Progress Notes (Signed)
[id number] Pt seen in hall independently standing outside of room. Came to see pt to see if she wanted to walk. Pt stated she is upset that she has to stay. Just got settled in bed with heating pad. Requested that I return after lunch. Will continue to follow. Graylon Good RN BSN 09/15/2020 9:22 AM

## 2020-09-15 NOTE — Progress Notes (Signed)
SATURATION QUALIFICATIONS: (This note is used to comply with regulatory documentation for home oxygen)  Patient Saturations on Room Air at Rest = 89%  Patient Saturations on Room Air while Ambulating = 86%  Patient Saturations on 3 Liters of oxygen while Ambulating = 91%  Please briefly explain why patient needs home oxygen: needed 3L to keep sats up

## 2020-09-15 NOTE — Progress Notes (Signed)
Subjective:  Denies chest pain.  Continues to have dyspnea on exertion, including with minimal ambulation in the room. Denies evidence of bleeding  Hemoglobin trended down again from 9.2 yesterday to 7.6 this morning BNP high at 2034 today Blood pressure remains uncontrolled Patient is eager to go home and became very upset at the prospect of having to stay in the hospital any longer.   Intake/Output from previous day:  I/O last 3 completed shifts: In: 7416 [P.O.:1080; I.V.:22] Out: -  No intake/output data recorded.  Blood pressure (!) 167/93, pulse 91, temperature 98.1 F (36.7 C), temperature source Oral, resp. rate (!) 21, height 5' 2"  (1.575 m), weight 52.8 kg, SpO2 97 %.  Physical Exam Vitals reviewed.  Constitutional:      Comments: Frail. Chronically ill-appearing  Neck:     Vascular: No carotid bruit or JVD.  Cardiovascular:     Rate and Rhythm: Normal rate and regular rhythm.     Pulses: Decreased pulses (pedal pulse).     Heart sounds: Normal heart sounds. No murmur heard.   No gallop.     Comments: Femoral access site well healed without evidence of hematoma or bruit Pulmonary:     Effort: Pulmonary effort is normal.     Breath sounds: Normal breath sounds.     Comments: Dyspneic with ambulation in room  Abdominal:     General: Bowel sounds are normal.     Palpations: Abdomen is soft.  Musculoskeletal:     Right lower leg: No edema.     Left lower leg: No edema.  Skin:    General: Skin is warm and dry.  Neurological:     Mental Status: She is alert.  Physical exam relatively unchanged compared to yesterday.  Lab Results: BMP BNP (last 3 results) Recent Labs    09/03/20 1035 09/14/20 0807 09/15/20 0029  BNP 1,371.8* 2,058.2* 2,034.8*     ProBNP (last 3 results) No results for input(s): PROBNP in the last 8760 hours. BMP Latest Ref Rng & Units 09/14/2020 09/13/2020 09/12/2020  Glucose 70 - 99 mg/dL 307(H) 248(H) 171(H)  BUN 8 - 23 mg/dL 56(H)  68(H) 77(H)  Creatinine 0.44 - 1.00 mg/dL 1.82(H) 1.91(H) 2.13(H)  Sodium 135 - 145 mmol/L 138 134(L) 136  Potassium 3.5 - 5.1 mmol/L 3.6 4.4 4.7  Chloride 98 - 111 mmol/L 106 107 108  CO2 22 - 32 mmol/L 22 18(L) 21(L)  Calcium 8.9 - 10.3 mg/dL 9.1 8.3(L) 8.6(L)   Hepatic Function Latest Ref Rng & Units 09/13/2020 09/06/2020 09/03/2020  Total Protein 6.5 - 8.1 g/dL 4.9(L) 5.1(L) 6.6  Albumin 3.5 - 5.0 g/dL 2.1(L) 2.4(L) 3.2(L)  AST 15 - 41 U/L 87(H) 109(H) 86(H)  ALT 0 - 44 U/L 54(H) 51(H) 55(H)  Alk Phosphatase 38 - 126 U/L 162(H) 124 143(H)  Total Bilirubin 0.3 - 1.2 mg/dL 0.8 0.4 0.8  Bilirubin, Direct 0.0 - 0.2 mg/dL - <0.1 0.1   CBC Latest Ref Rng & Units 09/15/2020 09/14/2020 09/13/2020  WBC 4.0 - 10.5 K/uL 9.4 10.6(H) 9.4  Hemoglobin 12.0 - 15.0 g/dL 7.6(L) 9.2(L) 6.6(LL)  Hematocrit 36.0 - 46.0 % 22.7(L) 28.2(L) 20.7(L)  Platelets 150 - 400 K/uL 291 295 265   Lipid Panel     Component Value Date/Time   CHOL 267 (H) 08/21/2020 0432   TRIG 243 (H) 08/21/2020 0432   HDL 65 08/21/2020 0432   CHOLHDL 4.1 08/21/2020 0432   VLDL 49 (H) 08/21/2020 0432   LDLCALC 153 (H) 08/21/2020  0432   Cardiac Panel (last 3 results) Recent Labs    09/12/20 1217  CKTOTAL 298*     HEMOGLOBIN A1C Lab Results  Component Value Date   HGBA1C 15.4 (H) 08/20/2020   MPG 395 08/20/2020   TSH Recent Labs    08/01/20 0126 09/03/20 1320  TSH 1.432 1.029    Imaging: Imaging results have been reviewed  Cardiac Studies:   Echocardiogram 09/06/2020:  1. Left ventricular ejection fraction, by estimation, is 55 to 60%. The  left ventricle has normal function. The left ventricle has no regional  wall motion abnormalities. There is mild left ventricular hypertrophy.  Left ventricular diastolic parameters  are consistent with Grade I diastolic dysfunction (impaired relaxation).   2. Right ventricular systolic function is normal. The right ventricular  size is normal. There is normal pulmonary  artery systolic pressure.   3. Left atrial size was mildly dilated.   4. Possible pericardial cyst adjacent to basal inferolateral left  ventricle.   5. No significant valvular abnormality. Normal right atrial pressure.   6. No significant change compared to previous study on 07/31/2020.    Coronary angiography 09/05/2020 [facility]):   Mid RCA lesion is 99% stenosed.   Prox LAD to Mid LAD lesion is 80% stenosed. (Pesonally reviewed. Appears 80%)   Mid Cx lesion is 40% stenosed.  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  As there was still TIMI I-I 0.5 flow, to prevent retrograde dissection of the ostial RCA into the aorta and also to prevent further antegrade dissection or complete closure of the vessel, a 3.5 x 15 millimeters resolute frontier DES was deployed.  Overall extensive dissection is evident involving the proximal and all the way to the distal RCA with no chance of successful angioplasty.  No devices could not cross after the initial angioplasty at the proximal right coronary artery segment.  1.5 mm balloon could not also cross.  This is in spite of GuideLiner support right from the get go.  Could not pass a heavier wire as well, after initial angioplasty I had attempted to place Iron Man guidewire but I was unable to cross the stenosis probably related to significant calcium and dissection at the focal site.  30 mL of contrast was utilized.  Patient will be admitted to the ICU, I have updated patient's daughter regarding the complication.  Echocardiogram 09/10/2020: 1. Left ventricular ejection fraction, by estimation, is 55 to 60%. Left ventricular ejection fraction by PLAX is 73 %. The left ventricle has normal function. There is moderate left ventricular hypertrophy. Left ventricular diastolic function could not  be evaluated. There is mild hypokinesis of the left ventricular, basal inferior wall. 2. Right ventricular systolic function is normal. The right ventricular size is normal. There is normal pulmonary artery systolic pressure. The estimated right ventricular systolic pressure is 62.8 mmHg. 3. Left atrial size was moderately dilated. 4. Right atrial size was moderately dilated. 5. The mitral valve is normal in structure. Mild mitral valve regurgitation. 6. Tricuspid valve regurgitation is moderate. 7. The aortic valve is normal in structure. Aortic valve regurgitation is not visualized. 8. There is mild (Grade II) plaque involving the ascending aorta. The inferior vena cava is normal in size with <50% respiratory variability, suggesting right atrial pressure of 8 mmHg. 9. Overall no significant change from 09/06/2020.  EKG: 09/04/2020: Sinus rhythm Possible old  anteroseptal infarct Inferolateral ST depression  09/10/2020: Mobitz 1 AV block @ 53/min. ST-T abnormality, with T inversion consider anterior ischemia. Compared to prior EKG, heart block new.   09/11/2020: Mobitz 1 AV block.  Normal axis.  ST-T abnormality, consider anterolateral ischemia.  No significant change from prior EKG.  Telemetry: Normal sinus rhythm  Scheduled Meds:  (feeding supplement) PROSource Plus  30 mL Oral BID BM   aspirin EC  81 mg Oral Daily   atorvastatin  40 mg Oral Daily   brexpiprazole  1 mg Oral Daily   dextrose  25 mL Intravenous Once   famotidine  10 mg Oral QHS   feeding supplement (GLUCERNA SHAKE)  237 mL Oral BID BM   folic acid  1 mg Oral Daily   furosemide  10 mg Oral Daily   gabapentin  300 mg Oral QHS   hydrocortisone  25 mg Rectal BID   insulin aspart  0-20 Units Subcutaneous TID WC   insulin aspart  0-5 Units Subcutaneous QHS   insulin aspart  0-5 Units Subcutaneous QHS   insulin aspart  4 Units Subcutaneous TID WC   insulin glargine-yfgn  20 Units Subcutaneous QHS    isosorbide-hydrALAZINE  2 tablet Oral TID   metoprolol tartrate  25 mg Oral BID   mirtazapine  15 mg Oral QHS   multivitamin with minerals  1 tablet Oral Daily   nicotine  21 mg Transdermal Daily   pantoprazole  40 mg Oral BID   psyllium  1 packet Oral Daily   senna-docusate  1 tablet Oral BID   sodium chloride flush  3 mL Intravenous Q12H   ticagrelor  90 mg Oral BID   vitamin B-12  1,000 mcg Oral Daily   Continuous Infusions:  sodium chloride     PRN Meds:.sodium chloride, acetaminophen, albuterol, fentaNYL (SUBLIMAZE) injection, metoCLOPramide, morphine injection, nitroGLYCERIN, ondansetron (ZOFRAN) IV, oxyCODONE, polyethylene glycol, sodium chloride flush, witch hazel-glycerin  Assessment/Plan:   Erica Mercer is a 65 y.o. African American female  with hypertension, hyperlipidemia, uncontrolled type 2 DM (A1C 15%), bipolar disorder, NSTEMI presented to [facility] on 09/03/2020 with chest pain and shortness of breath, also found to have severe hyperglycemia, without DKA. HS trop was 237 on 8/10.  EKG showed inferolateral ST depressions. Diagnostic angiogram by Dr. Neoma Laming showed severe calcific 95% mid RCA lesion, and probably non-critical but severe calcific 90% lesion in mid LAD. Dr.Callwood attempted intervention, unsuccessful and hence transferred to [facility].  Underwent unsuccessful attempt at angioplasty to RCA on 09/09/2020 with spiral dissection of calcified RCA.  Follow-up echocardiogram unchanged compared to prior before intervention and EKG without new ischemic changes.  Hypertension with chronic kidney disease: Continue BiDil 2 tablets three times daily Increase Lopressor from 25 mg to 50 mg twice daily  Switch patient from Lasix 10 mg p.o. daily to Lasix 20 mg IV twice daily Monitor renal function and blood pressures closely  Dyspnea on exertion:  BNP high at 2034 Increase Lasix as above Uptitration of antihypertensive medicaitons as above  Trend  BNP, BMP Strict I&Os, daily weights   Anemia Status post total 2 unit packed red blood cells (8/17 and 8/20) Hemoglobin 7.6 today, down from 9.2 yesterday Will discuss with primary team considering stopping Brilinta and requesting GI evaluate patient for hemorrhoid banding. Anemia is likely contributing to patient's dyspnea on exertion.  NSTEMI Unsuccessful attempt angioplasty 02/21/7865 with complication, spiral dissection of calcified RCA needing stenting to the ostium her to prevent complete  closure as well as retrograde spread of dissection to the aorta. Residual calcification 80-85% stenosis in proximal LAD Patient will need repeat cardiac catheterization to reevaluate both LAD stenosis and RCA stenosis, likely plan for this outpatient in 4 to 6 weeks. Repeat Echo following unsuccessful angioplasty attempt revealed no significant wall motion abnormality or change in LVEF.  Patient likely has significant collaterals. Patient remains chest pain free   Establish coronary artery disease with prior coronary interventions without angina pectoris: Continue dual antiplatelet therapy given the recent proximal RCA stent. Continue statin therapy  Increase Lopressor as above Monitor closely given Wenckebach phenomenon noted on telemetry/EKG previously during this admission.  Intermittent Mobitz 1 AV block No recurrence on telemetry over the last 24 hours  CKD, stage IIIb Continue to monitor renal function closely.  Diabetes mellitus type 2, insulin-dependent: Currently being managed by primary team.  Patient was seen in collaboration with Dr. Einar Gip. He also reviewed patient's chart and examined the patient. Dr. Einar Gip is in agreement of the plan.    Alethia Berthold, PA-C 09/15/2020, 11:29 AM Office: [phone]

## 2020-09-16 ENCOUNTER — Other Ambulatory Visit (HOSPITAL_COMMUNITY): Payer: Self-pay

## 2020-09-16 ENCOUNTER — Ambulatory Visit: Payer: Medicare Other | Admitting: Cardiology

## 2020-09-16 LAB — BASIC METABOLIC PANEL
Anion gap: 9 (ref 5–15)
Anion gap: 12 (ref 5–15)
BUN: 59 mg/dL — ABNORMAL HIGH (ref 8–23)
BUN: 64 mg/dL — ABNORMAL HIGH (ref 8–23)
CO2: 22 mmol/L (ref 22–32)
CO2: 21 mmol/L — ABNORMAL LOW (ref 22–32)
Calcium: 8.5 mg/dL — ABNORMAL LOW (ref 8.9–10.3)
Calcium: 8.7 mg/dL — ABNORMAL LOW (ref 8.9–10.3)
Chloride: 105 mmol/L (ref 98–111)
Chloride: 103 mmol/L (ref 98–111)
Creatinine, Ser: 1.9 mg/dL — ABNORMAL HIGH (ref 0.44–1.00)
Creatinine, Ser: 2.26 mg/dL — ABNORMAL HIGH (ref 0.44–1.00)
GFR, Estimated: 29 mL/min — ABNORMAL LOW (ref >60)
GFR, Estimated: 24 mL/min — ABNORMAL LOW (ref >60)
Glucose, Bld: 317 mg/dL — ABNORMAL HIGH (ref 70–99)
Glucose, Bld: 516 mg/dL (ref 70–99)
Potassium: 4.2 mmol/L (ref 3.5–5.1)
Potassium: 4.4 mmol/L (ref 3.5–5.1)
Sodium: 136 mmol/L (ref 135–145)
Sodium: 136 mmol/L (ref 135–145)

## 2020-09-16 LAB — CBC WITH DIFFERENTIAL/PLATELET
Abs Immature Granulocytes: 0.05 10*3/uL (ref 0.00–0.07)
Abs Immature Granulocytes: 0.04 10*3/uL (ref 0.00–0.07)
Basophils Absolute: 0 10*3/uL (ref 0.0–0.1)
Basophils Absolute: 0 10*3/uL (ref 0.0–0.1)
Basophils Relative: 0 %
Basophils Relative: 0 %
Eosinophils Absolute: 0 10*3/uL (ref 0.0–0.5)
Eosinophils Absolute: 0 10*3/uL (ref 0.0–0.5)
Eosinophils Relative: 0 %
Eosinophils Relative: 0 %
HCT: 23.7 % — ABNORMAL LOW (ref 36.0–46.0)
HCT: 25.3 % — ABNORMAL LOW (ref 36.0–46.0)
Hemoglobin: 7.7 g/dL — ABNORMAL LOW (ref 12.0–15.0)
Hemoglobin: 8.1 g/dL — ABNORMAL LOW (ref 12.0–15.0)
Immature Granulocytes: 1 %
Immature Granulocytes: 0 %
Lymphocytes Relative: 5 %
Lymphocytes Relative: 6 %
Lymphs Abs: 0.5 10*3/uL — ABNORMAL LOW (ref 0.7–4.0)
Lymphs Abs: 0.6 10*3/uL — ABNORMAL LOW (ref 0.7–4.0)
MCH: 32 pg (ref 26.0–34.0)
MCH: 31.5 pg (ref 26.0–34.0)
MCHC: 32.5 g/dL (ref 30.0–36.0)
MCHC: 32 g/dL (ref 30.0–36.0)
MCV: 98.3 fL (ref 80.0–100.0)
MCV: 98.4 fL (ref 80.0–100.0)
Monocytes Absolute: 0.2 10*3/uL (ref 0.1–1.0)
Monocytes Absolute: 0.4 10*3/uL (ref 0.1–1.0)
Monocytes Relative: 2 %
Monocytes Relative: 3 %
Neutro Abs: 9.7 10*3/uL — ABNORMAL HIGH (ref 1.7–7.7)
Neutro Abs: 9.9 10*3/uL — ABNORMAL HIGH (ref 1.7–7.7)
Neutrophils Relative %: 92 %
Neutrophils Relative %: 91 %
Platelets: 311 10*3/uL (ref 150–400)
Platelets: 337 10*3/uL (ref 150–400)
RBC: 2.41 MIL/uL — ABNORMAL LOW (ref 3.87–5.11)
RBC: 2.57 MIL/uL — ABNORMAL LOW (ref 3.87–5.11)
RDW: 17 % — ABNORMAL HIGH (ref 11.5–15.5)
RDW: 17 % — ABNORMAL HIGH (ref 11.5–15.5)
WBC: 10.5 10*3/uL (ref 4.0–10.5)
WBC: 10.9 10*3/uL — ABNORMAL HIGH (ref 4.0–10.5)
nRBC: 0 % (ref 0.0–0.2)
nRBC: 0 % (ref 0.0–0.2)

## 2020-09-16 LAB — GLUCOSE, CAPILLARY
Glucose-Capillary: 430 mg/dL — ABNORMAL HIGH (ref 70–99)
Glucose-Capillary: 434 mg/dL — ABNORMAL HIGH (ref 70–99)
Glucose-Capillary: 317 mg/dL — ABNORMAL HIGH (ref 70–99)
Glucose-Capillary: 42 mg/dL — CL (ref 70–99)
Glucose-Capillary: 103 mg/dL — ABNORMAL HIGH (ref 70–99)
Glucose-Capillary: 69 mg/dL — ABNORMAL LOW (ref 70–99)
Glucose-Capillary: 195 mg/dL — ABNORMAL HIGH (ref 70–99)

## 2020-09-16 LAB — BRAIN NATRIURETIC PEPTIDE: B Natriuretic Peptide: 2211.9 pg/mL — ABNORMAL HIGH (ref 0.0–100.0)

## 2020-09-16 MED ORDER — INSULIN ASPART 100 UNIT/ML IJ SOLN: 15.0000 [IU] | Freq: Once | INTRAMUSCULAR | Status: DC

## 2020-09-16 MED ORDER — INSULIN ASPART 100 UNIT/ML IJ SOLN
10.0000 [IU] | Freq: Once | INTRAMUSCULAR | Status: AC
  Administered 2020-09-16: 10 [IU] via INTRAVENOUS

## 2020-09-16 MED ORDER — INSULIN GLARGINE-YFGN 100 UNIT/ML ~~LOC~~ SOLN
30.0000 [IU] | Freq: Every day | SUBCUTANEOUS | Status: DC
  Administered 2020-09-16 – 2020-09-17 (×2): 30 [IU] via SUBCUTANEOUS
  Filled 2020-09-16 (×3): qty 0.3

## 2020-09-16 MED ORDER — INSULIN ASPART 100 UNIT/ML IJ SOLN
5.0000 [IU] | Freq: Once | INTRAMUSCULAR | Status: AC
  Administered 2020-09-16: 5 [IU] via INTRAVENOUS

## 2020-09-16 MED ORDER — SODIUM CHLORIDE 0.9 % IV SOLN
3.0000 g | Freq: Two times a day (BID) | INTRAVENOUS | Status: DC
  Administered 2020-09-16 – 2020-09-18 (×3): 3 g via INTRAVENOUS
  Filled 2020-09-16 (×5): qty 8

## 2020-09-16 MED ORDER — IPRATROPIUM-ALBUTEROL 0.5-2.5 (3) MG/3ML IN SOLN
3.0000 mL | Freq: Three times a day (TID) | RESPIRATORY_TRACT | Status: DC
  Administered 2020-09-16: 3 mL via RESPIRATORY_TRACT
  Filled 2020-09-16: qty 3

## 2020-09-16 MED ORDER — IPRATROPIUM-ALBUTEROL 0.5-2.5 (3) MG/3ML IN SOLN
3.0000 mL | Freq: Three times a day (TID) | RESPIRATORY_TRACT | Status: DC | PRN
  Administered 2020-09-17: 3 mL via RESPIRATORY_TRACT
  Filled 2020-09-16: qty 3

## 2020-09-16 MED ORDER — PREDNISONE 20 MG PO TABS: 20.0000 mg | ORAL_TABLET | Freq: Every day | ORAL | Status: DC

## 2020-09-16 MED ORDER — INSULIN ASPART 100 UNIT/ML IJ SOLN
10.0000 [IU] | Freq: Three times a day (TID) | INTRAMUSCULAR | Status: DC
  Administered 2020-09-16 – 2020-09-18 (×3): 10 [IU] via SUBCUTANEOUS

## 2020-09-16 NOTE — Progress Notes (Signed)
Physical Therapy Treatment Patient Details Name: Erica Mercer MRN: [id number] DOB: [birth year] Today's Date: 09/16/2020    History of Present Illness Pt is a 65 y.o. admitted 09/11/20 with SOB, chest pain; workup for NSTEMI. Pt underwent LHC at [facility] which showed 90% RCA stenosis with severe calcification, unable to be ballooned; transfer to [facility] for further intervention. Pt also with chronic rectal bleeding with constipation. PMH includes DM, HTN, anemia, bipolar disorder, cigarette smoker.    PT Comments    Pt progressing towards all goals. Pt functioning at supervision level and suspect pt to be near baseline with impulsive behavior. Pt does require 3L02 via [place] to maintain SPO2 >92% during ambulation. Pt with noted SOB and increased RR. Pt given energy conservation handout and PT began education and discussing options in her home prior to lunch arriving. Acute PT to cont to follow to progress towards indep.    Follow Up Recommendations  Home health PT;Supervision - Intermittent     Equipment Recommendations  3in1 (PT)    Recommendations for Other Services       Precautions / Restrictions Precautions Precautions: Fall;Other (comment) Precaution Comments: watch SpO2 Restrictions Weight Bearing Restrictions: No    Mobility  Bed Mobility Overal bed mobility: Independent             General bed mobility comments: HOB flat, pt able to bring self to EOB without assist or difficulty, mild SOB    Transfers Overall transfer level: Needs assistance Equipment used: None Transfers: Sit to/from Stand Sit to Stand: Supervision         General transfer comment: supervision for safety due to impulsivity  Ambulation/Gait Ambulation/Gait assistance: Min guard;Supervision Gait Distance (Feet): 300 Feet Assistive device: Rolling walker (2 wheeled) Gait Pattern/deviations: Step-through pattern Gait velocity: various speeds, sometimes impulsively fast, others WFL Gait velocity  interpretation: 1.31 - 2.62 ft/sec, indicative of limited community ambulator General Gait Details: pt quick to move, progressively more reliant on RW requiring v/c's to stay in walker and not push out to far in front. Pt with unsafe walker management during turns as pt was pushing to far out in front of self   Stairs             Wheelchair Mobility    Modified Rankin (Stroke Patients Only)       Balance Overall balance assessment: Mild deficits observed, not formally tested             Standing balance comment: pt standing and donning depends and mesh underwear                            Cognition Arousal/Alertness: Awake/alert Behavior During Therapy: Impulsive Overall Cognitive Status: No family/caregiver present to determine baseline cognitive functioning                                 General Comments: suspect pt at baseline cognition and that impulsive, quick to move behavior is also baseline, pt is aware of multiple lines but still moves very quick in room      Exercises      General Comments General comments (skin integrity, edema, etc.): SPO2 >94% on 3LO2 via [place]      Pertinent Vitals/Pain Pain Assessment: No/denies pain    Home Living  Prior Function            PT Goals (current goals can now be found in the care plan section) Acute Rehab PT Goals Patient Stated Goal: go home Progress towards PT goals: Progressing toward goals    Frequency    Min 3X/week      PT Plan Current plan remains appropriate    Co-evaluation              AM-PAC PT "6 Clicks" Mobility   Outcome Measure  Help needed turning from your back to your side while in a flat bed without using bedrails?: None Help needed moving from lying on your back to sitting on the side of a flat bed without using bedrails?: None Help needed moving to and from a bed to a chair (including a wheelchair)?: A Little Help  needed standing up from a chair using your arms (e.g., wheelchair or bedside chair)?: A Little Help needed to walk in hospital room?: A Little Help needed climbing 3-5 steps with a railing? : A Little 6 Click Score: 20    End of Session Equipment Utilized During Treatment: Other (comment) (3Lo2 via [place]) Activity Tolerance: Patient tolerated treatment well;Patient limited by fatigue Patient left: in bed;with call bell/phone within reach (sitting EOB to eat lunch) Nurse Communication: Mobility status PT Visit Diagnosis: Other abnormalities of gait and mobility (R26.89)     Time: [id number] PT Time Calculation (min) (ACUTE ONLY): 21 min  Charges:  $Gait Training: 8-22 mins                     Kittie Plater, PT, DPT Acute Rehabilitation Services Pager #: [phone] Office #: [phone]    Berline Lopes 09/16/2020, 1:27 PM

## 2020-09-16 NOTE — Discharge Instructions (Signed)

## 2020-09-16 NOTE — Progress Notes (Signed)
Nutrition Follow-up  DOCUMENTATION CODES:   Underweight, Severe malnutrition in context of chronic illness  INTERVENTION:   - Continue Glucerna Shake po BID, each supplement provides 220 kcal and 10 grams of protein  - Continue ProSource Plus 30 ml PO BID, each supplement provides 100 kcal and 15 grams of protein  - Continue MVI with minerals daily  NUTRITION DIAGNOSIS:   Severe Malnutrition related to chronic illness as evidenced by severe fat depletion, severe muscle depletion.  Ongoing, being addressed via oral nutrition supplements  GOAL:   Patient will meet greater than or equal to 90% of their needs  Progressing  MONITOR:   PO intake, Supplement acceptance, Weight trends, Labs, I & O's  REASON FOR ASSESSMENT:   Consult Assessment of nutrition requirement/status  ASSESSMENT:   65 year old female past medical history for DM, hypertension, dyslipidemia, chronic anemia, bipolar disorder who presented with chest pain. Pt admitted to the hospital working diagnosis of non-ST elevation myocardial infarction complicated by acute diastolic heart failure decompensation and acute kidney injury.  Pt unavailable at time of RD visit. Meal completions have been good with 5 out of last 10 documented meal completions being 100%. Pt accepting most Glucerna Shake and ProSource Plus supplements per [facility] documentation. Noted pt on remeron.  Admit weight: 43 kg Current weight: 52.8 kg  Pt with an almost 10 kg weight gain since admission. Question how much of weight gain can be attributed to fluid status. Pt is +7.3 L positive since admission per I/O's flowsheet. Pt with non-pitting edema to BLE.  Meal Completion: 75-100% x last 8 documented meals  Medications reviewed and include: ProSource Plus BID, pepcid, Glucerna Shake BID, folic acid, anusol suppository BID, SSI, novolog 10 units TID with meals, semglee 30 units daily, remeron, MVI with minerals daily, protonix, klor-con 10 mEq  BID, psyllium, senna, vitamin B-12, IV abx  Labs reviewed: BUN 64, creatinine 2.26, hemoglobin 8.1 CBG's: 162-434 x 24 hours  Diet Order:   Diet Order             Diet Carb Modified Fluid consistency: Thin; Room service appropriate? Yes  Diet effective now                   EDUCATION NEEDS:   No education needs have been identified at this time  Skin:  Skin Assessment: Reviewed RN Assessment  Last BM:  09/15/20  Height:   Ht Readings from Last 1 Encounters:  09/05/20 '5\' 2"'$  (1.575 m)    Weight:   Wt Readings from Last 1 Encounters:  09/15/20 52.8 kg    Ideal Body Weight:  50 kg  BMI:  Body mass index is 21.31 kg/m.  Estimated Nutritional Needs:   Kcal:  1600-1800  Protein:  75-90 grams  Fluid:  >/= 1.6 L/day    Gustavus Bryant, MS, RD, LDN Inpatient Clinical Dietitian Please see AMiON for contact information.

## 2020-09-16 NOTE — Progress Notes (Signed)
PROGRESS NOTE    Erica Mercer  [id number] DOB: 31-May-1955 DOA: 09/05/2020 PCP: [name], NP   Chief Complain:  Brief Narrative: Patient is a 47 old female with history of insulin-dependent diabetes, hypertension, hyperlipidemia, chronic anemia, cigarette smoker, bipolar disorder who presented with shortness of breath and chest pain.  She underwent left heart cath at [facility] which showed 90% RCAl stenosis with severe calcification, unable to be ballooned and was  transferred to [facility] for further intervention of RCA.  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  Hospital course also remarkable for anemia requiring blood transfusion suspected from hemorrhoids for which GI recommended conservative management.  Assessment & Plan:   Principal Problem:   NSTEMI (non-ST elevated myocardial infarction) ([place] ) Active Problems:   Severe protein-calorie malnutrition (HCC)   Essential hypertension   HLD (hyperlipidemia)   COPD (chronic obstructive pulmonary disease) (HCC)   Bipolar 1 disorder (HCC)   Non-ST elevation (NSTEMI) myocardial infarction ([place])   AKI (acute kidney injury) ([place])   Stage 3a chronic kidney disease (HCC)   Type II diabetes mellitus with renal manifestations (HCC)   Anemia of chronic disease   Grade III hemorrhoids   Atherosclerosis of native coronary artery of native heart without angina pectoris   Dyspnea on exertion   NSTEMI:  She underwent left heart cath at [facility] which showed 90% RCAl stenosis with severe calcification, unable to be ballooned and was  transferred to [facility] for further intervention of RCA.  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  Cardiology following.  Currently on aspirin and  Brilinta.  Continue metoprolol. Cardiology recommending repeat cardiac catheter to reevaluate both LAD stenosis and RCA stenosis as an outpatient in 4 to 6 weeks.  Currently chest pain-free  Acute hypoxic respiratory failure/aspiration PNA: Likely multifactorial from PNA/COPD/CHF.  She likely qualifies for home oxygen.  We are still trying to taper the oxygen. CXR done on 8/22 showed developing retrocardiac consolidation with air bronchograms  possibly related to acute infection or aspiration.Started on unasyn for possible aspiration  Lower GI bleed/acute on chronic normocytic: Suspected to be from hemorrhoids.  She required blood transfusion during this hospitalization.    We will continue to monitor H&H.  GI was consulted in this admission, recommended conservative management.  Hemoglobin stable in the range of 8 today  Diastolic congestive heart failure/dyspnea on exertion: She presented with bilateral lower extremity edema.  Has Severely elevated BNP.  Cardiology started on  Lasix,now on hold.  Monitor input/output, daily weight  Hypertension: Currently stable.  .  AKI on CKD stage 3b: Renal ultrasound did not show any obstruction.  Avoid nephrotoxins.  Creatinine started trending up.  Lasix held  Insulin-dependent diabetes type 2: Uncontrolled with hemoglobin A1c of 15.4.  Has been admitted multiple times for DKA this year.  Continue to monitor blood sugars.  Continue current insulin regimen.  She is to follow-up with her PCP closely/endocrinologist.  Hyperglycemic today most likely secondary to steroid.  Had to be given multiple doses of insulin  History of CopD: Found to be wheezing on 8/22..  Currently on 3 to 4 L of oxygen per minute.  Will try to taper the oxygen and monitor her room air.  She is not on oxygen at home.  Continue bronchodilators  Constipation: Continue bowel regimen  Debility/deconditioning: Patient seen by PT/OT recommended home health on discharge     Nutrition  Problem: Severe Malnutrition Etiology: chronic illness      DVT prophylaxis:SCD Code Status: Full Family Communication: None at bedside Status is: Inpatient  Remains inpatient appropriate because:Inpatient level of care appropriate due to severity of illness  Dispo: The patient is from: Home              Anticipated d/c is to: Home              Patient currently is not medically stable to d/c.   Difficult to place patient No     Consultants:  Cardiology  Procedures: Cardiac cath  Antimicrobials:  Anti-infectives (From admission, onward)    Start     Dose/Rate Route Frequency Ordered Stop   09/09/20 1448  ceFAZolin (ANCEF) IVPB 2 g/50 mL premix        over 30 Minutes  Continuous PRN 09/09/20 1448 09/09/20 1448       Subjective: Patient seen and examined at the bedside this morning.  Hemodynamically stable.  Requiring 3 to 4 L of oxygen.  She ambulated on the hallway today.  Her wheezings have improved but she is requiring persistent oxygen.  She is very eager to go home  Objective: Vitals:   09/15/20 2038 09/16/20 0021 09/16/20 0547 09/16/20 0744  BP:   130/75 (!) 162/85  Pulse:  87 78 81  Resp:    16  Temp:  98.1 F (36.7 C) 98.4 F (36.9 C) 97.7 F (36.5 C)  TempSrc:  Oral Oral Oral  SpO2: 97% 100%  99%  Weight:      Height:        Intake/Output Summary (Last 24 hours) at 09/16/2020 0747 Last data filed at 09/15/2020 2000 Gross per 24 hour  Intake 9 ml  Output --  Net 9 ml   Filed Weights   09/12/20 0300 09/14/20 0300 09/15/20 0100  Weight: 55.7 kg 54.4 kg 52.8 kg    Examination:  General exam: Overall comfortable, not in distress, very deconditioned, chronically ill looking HEENT: PERRL Respiratory system:  no wheezes or crackles , diminished air sounds bilaterally Cardiovascular system: S1 & S2 heard, RRR.  Gastrointestinal system: Abdomen is nondistended, soft and nontender. Central nervous system: Alert and oriented Extremities: No edema,  no clubbing ,no cyanosis Skin: No rashes, no ulcers,no icterus     Data Reviewed: I have personally reviewed following labs and imaging studies  CBC: Recent Labs  Lab 09/12/20 1218 09/13/20 0019 09/14/20 0807 09/15/20 0029 09/16/20 0010  WBC 13.6* 9.4 10.6* 9.4 10.5  NEUTROABS  --   --   --   --  9.7*  HGB 7.6* 6.6* 9.2* 7.6* 7.7*  HCT 23.1* 20.7* 28.2* 22.7* 23.7*  MCV 100.9* 101.5* 98.6 97.8 98.3  PLT 260 265 295 291 [id number]   Basic Metabolic Panel: Recent Labs  Lab 09/11/20 0117 09/12/20 0054 09/13/20 0019 09/14/20 0807 09/15/20 0029 09/16/20 0010  NA 136 136 134* 138  --  136  K 4.9 4.7 4.4 3.6  --  4.2  CL 107 108 107 106  --  105  CO2 22 21* 18* 22  --  22  GLUCOSE 209* 171* 248* 307*  --  317*  BUN 84* 77* 68* 56*  --  59*  CREATININE 2.84* 2.13* 1.91* 1.82*  --  1.90*  CALCIUM 8.6* 8.6* 8.3* 9.1  --  8.5*  MG  --   --   --   --  1.9  --    GFR: Estimated Creatinine Clearance: 23.3 mL/min (A) (by C-G formula based on SCr of 1.9 mg/dL (H)). Liver Function Tests: Recent Labs  Lab 09/13/20 0019  AST 87*  ALT 54*  ALKPHOS 162*  BILITOT 0.8  PROT 4.9*  ALBUMIN 2.1*   No results for input(s): LIPASE, AMYLASE in the last 168 hours. No results for input(s): AMMONIA in the last 168 hours. Coagulation Profile: No results for input(s): INR, PROTIME in the last 168 hours. Cardiac Enzymes: Recent Labs  Lab 09/12/20 1217  CKTOTAL 298*   BNP (last 3 results) No results for input(s): PROBNP in the last 8760 hours. HbA1C: No results for input(s): HGBA1C in the last 72 hours. CBG: Recent Labs  Lab 09/15/20 1106 09/15/20 1623 09/15/20 2038 09/16/20 0651 09/16/20 0652  GLUCAP 72 224* 162* 430* 434*   Lipid Profile: No results for input(s): CHOL, HDL, LDLCALC, TRIG, CHOLHDL, LDLDIRECT in the last 72 hours. Thyroid Function Tests: No results for input(s): TSH, T4TOTAL, FREET4, T3FREE, THYROIDAB in the last 72 hours. Anemia Panel: No results for input(s):  VITAMINB12, FOLATE, FERRITIN, TIBC, IRON, RETICCTPCT in the last 72 hours.  Sepsis Labs: Recent Labs  Lab 09/11/20 0117  LATICACIDVEN 1.3    No results found for this or any previous visit (from the past 240 hour(s)).       Radiology Studies: DG CHEST PORT 1 VIEW  Result Date: 09/15/2020 CLINICAL DATA:  Dyspnea EXAM: PORTABLE CHEST 1 VIEW COMPARISON:  09/08/2020 FINDINGS: There is developing retrocardiac consolidation with air bronchograms now identified possibly related to acute infection or aspiration. No pneumothorax or pleural effusion. Cardiac size is mildly enlarged, unchanged. Pulmonary vascularity is normal. No acute bone abnormality. IMPRESSION: Developing retrocardiac consolidation Electronically Signed   By: Fidela Salisbury M.D.   On: 09/15/2020 19:38        Scheduled Meds:  (feeding supplement) PROSource Plus  30 mL Oral BID BM   aspirin EC  81 mg Oral Daily   atorvastatin  40 mg Oral Daily   brexpiprazole  1 mg Oral Daily   clopidogrel  75 mg Oral Daily   dextrose  25 mL Intravenous Once   famotidine  10 mg Oral QHS   feeding supplement (GLUCERNA SHAKE)  237 mL Oral BID BM   folic acid  1 mg Oral Daily   furosemide  40 mg Intravenous BID   gabapentin  300 mg Oral QHS   hydrocortisone  25 mg Rectal BID   insulin aspart  0-20 Units Subcutaneous TID WC   insulin aspart  0-5 Units Subcutaneous QHS   insulin aspart  0-5 Units Subcutaneous QHS   insulin aspart  10 Units Subcutaneous TID WC   insulin glargine-yfgn  30 Units Subcutaneous QHS   ipratropium-albuterol  3 mL Nebulization TID   isosorbide-hydrALAZINE  2 tablet Oral TID   metoprolol tartrate  50 mg Oral BID   mirtazapine  15 mg Oral QHS   multivitamin with minerals  1 tablet Oral Daily   nicotine  21 mg Transdermal Daily   pantoprazole  40 mg Oral BID   potassium chloride  10 mEq Oral BID   [START ON 09/17/2020] predniSONE  20 mg Oral Q breakfast   psyllium  1 packet Oral Daily   senna-docusate  1  tablet Oral BID   sodium chloride flush  3 mL Intravenous Q12H   vitamin B-12  1,000 mcg Oral Daily   Continuous Infusions:  sodium chloride  LOS: 11 days    Time spent:35 mins. More than 50% of that time was spent in counseling and/or coordination of care.      Shelly Coss, MD [facility] P8/23/2022, 7:47 AM

## 2020-09-16 NOTE — Progress Notes (Signed)
TRH night shift telemetry coverage note.  The nursing staff reported that the patient's CBG was 434 mg/dL.  She is currently on prednisone 40 mg p.o. daily.  NovoLog 15 units SQ x1 dose ordered.  Tennis Must, MD.

## 2020-09-16 NOTE — Plan of Care (Signed)

## 2020-09-16 NOTE — TOC Benefit Eligibility Note (Signed)
Patient Teacher, English as a foreign language completed.    The patient is currently admitted and upon discharge could be taking Bidil 20-37.5 mg .  The current 30 day co-pay is, $0.00.   The patient is insured through [place], [place] Patient Advocate Specialist [place] Team Direct Number: [phone]  Fax: [phone]

## 2020-09-16 NOTE — Progress Notes (Signed)
Inpatient Diabetes Program Recommendations  AACE/ADA: New Consensus Statement on Inpatient Glycemic Control   Target Ranges:  Prepandial:   less than 140 mg/dL      Peak postprandial:   less than 180 mg/dL (1-2 hours)      Critically ill patients:  140 - 180 mg/dL   Results for Erica Mercer, Erica Mercer (MRN [id number]) as of 09/16/2020 07:40  Ref. Range 09/15/2020 06:42 09/15/2020 11:06 09/15/2020 16:23 09/15/2020 20:38 09/16/2020 06:51 09/16/2020 06:52  Glucose-Capillary Latest Ref Range: 70 - 99 mg/dL 250 (H) 72 224 (H) 162 (H) 430 (H) 434 (H)    Review of Glycemic Control  Diabetes history: DM Outpatient Diabetes medications: Semglee 18 units daily, Humalog 3 units TID with meals Current orders for Inpatient glycemic control:  Semglee 20 units daily, Novolog 0-20 units TID with meals, Novolog 0-5 units QHS, Novolog 4 units TID with meals for meal coverage; Prednisone 40 mg QAM  Inpatient Diabetes Program Recommendations:    Insulin: If steroids are continued as ordered, please consider increasing Semglee to 24 units daily.  Thanks, Barnie Alderman, RN, MSN, CDE Diabetes Coordinator Inpatient Diabetes Program [phone] (Team Pager from 8am to 5pm)

## 2020-09-16 NOTE — Progress Notes (Signed)
CARDIAC REHAB PHASE I   PRE:  Rate/Rhythm: 80 SR  BP:  Supine: 162/85  Sitting: 182/109  Standing:    SaO2: 100% 3L  MODE:  Ambulation: 410 ft   POST:  Rate/Rhythm: 84 SR  BP:  Supine:   Sitting: 156/80  Standing:    SaO2: 96% 3L 0901-1004 Pt on phone with family. Waited for her so we could walk. Walked 410 ft on 3L with rollator and did not need to sit to rest. Slightly SOB during walk. Back to room and pt to bathroom. Did not requalify for oxygen as I qualified yesterday. Was going to walk again and make sure oxygen still needed but notified by RN that blood glucose high. Assisted pt back to bed and left on 3L. Oxygen  need can be evaluated  again on next walk.  Pt wants rollator for home use.   Graylon Good, RN BSN  09/16/2020 9:58 AM

## 2020-09-16 NOTE — Progress Notes (Signed)
Pharmacy Antibiotic Note  Erica Mercer is a 65 y.o. female admitted on 09/05/2020 with  aspiration PNA .  Pharmacy has been consulted for unasyn dosing.  WBC 10.9, afebrile. Scr 2.26 (CrCl 19 mL/min). CXR on 8/22 showing developing retrocardiac consolidation with air bronchogram.    Plan: Unasyn 3g IV every 12 hours Monitor renal fx, cx results, clinical pic   Height: '5\' 2"'$  (157.5 cm) Weight: 52.8 kg (116 lb 8 oz) IBW/kg (Calculated) : 50.1  Temp (24hrs), Avg:98.2 F (36.8 C), Min:97.7 F (36.5 C), Max:98.7 F (37.1 C)  Recent Labs  Lab 09/11/20 0117 09/12/20 0054 09/12/20 1218 09/13/20 0019 09/14/20 0807 09/15/20 0029 09/16/20 0010 09/16/20 0846  WBC 8.9  --    < > 9.4 10.6* 9.4 10.5 10.9*  CREATININE 2.84* 2.13*  --  1.91* 1.82*  --  1.90* 2.26*  LATICACIDVEN 1.3  --   --   --   --   --   --   --    < > = values in this interval not displayed.    Estimated Creatinine Clearance: 19.6 mL/min (A) (by C-G formula based on SCr of 2.26 mg/dL (H)).    No Known Allergies  Antimicrobials this admission: Unasyn 8/23 >>   Dose adjustments this admission: N/A  Microbiology results: N/A  Thank you for allowing pharmacy to be a part of this patient's care.  Antonietta Jewel, PharmD, BCCCP Clinical Pharmacist  Phone: [phone] 09/16/2020 1:54 PM  Please check AMION for all [place] phone numbers After 10:00 PM, call [place] [phone]

## 2020-09-17 ENCOUNTER — Inpatient Hospital Stay (HOSPITAL_COMMUNITY): Payer: Medicare Other

## 2020-09-17 LAB — CBC WITH DIFFERENTIAL/PLATELET
Abs Immature Granulocytes: 0.02 10*3/uL (ref 0.00–0.07)
Basophils Absolute: 0 10*3/uL (ref 0.0–0.1)
Basophils Relative: 0 %
Eosinophils Absolute: 0 10*3/uL (ref 0.0–0.5)
Eosinophils Relative: 0 %
HCT: 22.2 % — ABNORMAL LOW (ref 36.0–46.0)
Hemoglobin: 7.1 g/dL — ABNORMAL LOW (ref 12.0–15.0)
Immature Granulocytes: 0 %
Lymphocytes Relative: 18 %
Lymphs Abs: 1.7 10*3/uL (ref 0.7–4.0)
MCH: 31.8 pg (ref 26.0–34.0)
MCHC: 32 g/dL (ref 30.0–36.0)
MCV: 99.6 fL (ref 80.0–100.0)
Monocytes Absolute: 0.6 10*3/uL (ref 0.1–1.0)
Monocytes Relative: 7 %
Neutro Abs: 7.3 10*3/uL (ref 1.7–7.7)
Neutrophils Relative %: 75 %
Platelets: 321 10*3/uL (ref 150–400)
RBC: 2.23 MIL/uL — ABNORMAL LOW (ref 3.87–5.11)
RDW: 16.8 % — ABNORMAL HIGH (ref 11.5–15.5)
WBC: 9.7 10*3/uL (ref 4.0–10.5)
nRBC: 0 % (ref 0.0–0.2)

## 2020-09-17 LAB — GLUCOSE, CAPILLARY
Glucose-Capillary: 263 mg/dL — ABNORMAL HIGH (ref 70–99)
Glucose-Capillary: 249 mg/dL — ABNORMAL HIGH (ref 70–99)
Glucose-Capillary: 94 mg/dL (ref 70–99)
Glucose-Capillary: 210 mg/dL — ABNORMAL HIGH (ref 70–99)
Glucose-Capillary: 329 mg/dL — ABNORMAL HIGH (ref 70–99)

## 2020-09-17 LAB — BASIC METABOLIC PANEL
Anion gap: 8 (ref 5–15)
BUN: 70 mg/dL — ABNORMAL HIGH (ref 8–23)
CO2: 24 mmol/L (ref 22–32)
Calcium: 8.6 mg/dL — ABNORMAL LOW (ref 8.9–10.3)
Chloride: 105 mmol/L (ref 98–111)
Creatinine, Ser: 2.39 mg/dL — ABNORMAL HIGH (ref 0.44–1.00)
GFR, Estimated: 22 mL/min — ABNORMAL LOW (ref >60)
Glucose, Bld: 286 mg/dL — ABNORMAL HIGH (ref 70–99)
Potassium: 4.8 mmol/L (ref 3.5–5.1)
Sodium: 137 mmol/L (ref 135–145)

## 2020-09-17 MED ORDER — BUDESONIDE-FORMOTEROL FUMARATE 160-4.5 MCG/ACT IN AERO: 2.0000 | INHALATION_SPRAY | Freq: Two times a day (BID) | RESPIRATORY_TRACT | 2 refills | Status: AC

## 2020-09-17 MED ORDER — IPRATROPIUM-ALBUTEROL 0.5-2.5 (3) MG/3ML IN SOLN: 3.0000 mL | Freq: Three times a day (TID) | RESPIRATORY_TRACT | 1 refills | Status: AC | PRN

## 2020-09-17 MED ORDER — METOPROLOL TARTRATE 50 MG PO TABS: 50.0000 mg | ORAL_TABLET | Freq: Two times a day (BID) | ORAL | 0 refills | Status: DC

## 2020-09-17 MED ORDER — POTASSIUM CHLORIDE CRYS ER 20 MEQ PO TBCR: 10.0000 meq | EXTENDED_RELEASE_TABLET | Freq: Every day | ORAL | 0 refills | Status: AC

## 2020-09-17 MED ORDER — HYDROCORTISONE ACETATE 25 MG RE SUPP: 25.0000 mg | Freq: Two times a day (BID) | RECTAL | 0 refills | Status: AC

## 2020-09-17 MED ORDER — GABAPENTIN 300 MG PO CAPS: 300.0000 mg | ORAL_CAPSULE | Freq: Every day | ORAL | 0 refills | Status: AC

## 2020-09-17 MED ORDER — SENNOSIDES-DOCUSATE SODIUM 8.6-50 MG PO TABS: 1.0000 | ORAL_TABLET | Freq: Two times a day (BID) | ORAL | 0 refills | Status: AC

## 2020-09-17 MED ORDER — ISOSORB DINITRATE-HYDRALAZINE 20-37.5 MG PO TABS: 2.0000 | ORAL_TABLET | Freq: Three times a day (TID) | ORAL | 0 refills | Status: AC

## 2020-09-17 MED ORDER — FOLIC ACID 1 MG PO TABS: 1.0000 mg | ORAL_TABLET | Freq: Every day | ORAL | 0 refills | Status: AC

## 2020-09-17 MED ORDER — FUROSEMIDE 20 MG PO TABS: 20.0000 mg | ORAL_TABLET | Freq: Every day | ORAL | 0 refills | Status: AC

## 2020-09-17 MED ORDER — GABAPENTIN 50 MG PO TABS: 300.0000 mg | ORAL_TABLET | Freq: Every day | ORAL | 0 refills | Status: DC

## 2020-09-17 MED ORDER — AMOXICILLIN-POT CLAVULANATE 875-125 MG PO TABS: 1.0000 | ORAL_TABLET | Freq: Two times a day (BID) | ORAL | 0 refills | Status: DC

## 2020-09-17 MED ORDER — CLOPIDOGREL BISULFATE 75 MG PO TABS: 75.0000 mg | ORAL_TABLET | Freq: Every day | ORAL | 0 refills | Status: AC

## 2020-09-17 MED ORDER — METOPROLOL TARTRATE 12.5 MG HALF TABLET
12.5000 mg | ORAL_TABLET | Freq: Two times a day (BID) | ORAL | Status: DC
  Administered 2020-09-17 – 2020-09-18 (×2): 12.5 mg via ORAL
  Filled 2020-09-17 (×2): qty 1

## 2020-09-17 MED ORDER — CYANOCOBALAMIN 1000 MCG PO TABS: 1000.0000 ug | ORAL_TABLET | Freq: Every day | ORAL | 0 refills | Status: AC

## 2020-09-17 MED ORDER — AMOXICILLIN-POT CLAVULANATE 500-125 MG PO TABS: 1.0000 | ORAL_TABLET | Freq: Two times a day (BID) | ORAL | 0 refills | Status: AC

## 2020-09-17 NOTE — Discharge Summary (Addendum)
Physician Discharge Summary  Erica Mercer [id number] DOB: 08-May-1955 DOA: 09/05/2020  PCP: [name], NP  Admit date: 09/05/2020 Discharge date: 09/18/2020  Admitted From: Home Disposition:  Home  Discharge Condition:Stable CODE STATUS:FULL Diet recommendation: Heart Healthy    Brief/Interim Summary: Patient is a 65 old female with history of insulin-dependent diabetes, hypertension, hyperlipidemia, chronic anemia, cigarette smoker, bipolar disorder who presented with shortness of breath and chest pain.  She underwent left heart cath at [facility] which showed 90% RCAl stenosis with severe calcification, unable to be ballooned and was  transferred to [facility] for further intervention of RCA.  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 Currently she is hemodynamically stable.  Hospital course also remarkable for anemia requiring blood transfusion suspected from hemorrhoids for which GI recommended conservative management.  PT recommended home health on discharge.  Hospital course also remarkable for acute hypoxic respiratory failure requiring oxygen, she qualified for home oxygen.  She is medically stable for discharge today.  Following problems were addressed during her hospitalization:  NSTEMI:  She underwent left heart cath at [facility] which showed 90% RCAl stenosis with severe calcification, unable to be ballooned and was  transferred to [facility] for further intervention of RCA.  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  Cardiology  were following.  Currently on aspirin and Brilinta.  Continue metoprolol. Cardiology recommending repeat cardiac catheter to reevaluate both LAD stenosis and RCA stenosis as an outpatient in 4 to 6 weeks.   Currently chest pain-free.She will follow up at Dr Irven Shelling office   Acute hypoxic respiratory failure/aspiration PNA: Likely multifactorial from PNA/COPD/CHF.   CXR done on 8/22 showed developing retrocardiac consolidation with air bronchograms  possibly related to acute infection or aspiration.Started on unasyn for possible aspiration,changed to augmentin on DC.  She qualified for home oxygen   Lower GI bleed/acute on chronic normocytic: Suspected to be from hemorrhoids.  She required blood transfusion during this hospitalization.  GI was consulted in this admission, recommended conservative management.  Hemoglobin stable in the range of 7 today   Diastolic congestive heart failure/dyspnea on exertion: She presented with bilateral lower extremity edema.  Has Severely elevated BNP.  Cardiology started on  IV Lasix,now on hold. She will be discharged on Lasix 20 mg daily at home   Hypertension: Currently stable.  Continue current medications.   AKI on CKD stage 3b: Renal ultrasound did not show any obstruction.  Avoid nephrotoxins.  Creatinine started trending up.  IV Lasix held.  Currently appears euvolemic.  Follow-up BMP in a week by following with the PCP.   Insulin-dependent diabetes type 2: Uncontrolled with hemoglobin A1c of 15.4.  Likely from noncompliance.  Has been admitted multiple times for DKA this year.Continue current insulin regimen.  She is to follow-up with her PCP closely/endocrinologist.     History of CopD: Found to be wheezing on 8/22..  Currently on 3 to 4 L of oxygen per minute.  She is not on oxygen at home.  Continue bronchodilators: Albuterol, Symbicort.  We will also discharge her with home nebulization machine.  She has been counseled to stop smoking   Constipation: Continue bowel regimen   Debility/deconditioning: Patient seen by PT/OT recommended home health on discharge     Discharge Diagnoses:  Principal Problem:   NSTEMI (non-ST elevated myocardial  infarction)  [facility]) Active Problems:   Severe protein-calorie malnutrition ([place])   Essential hypertension   HLD (hyperlipidemia)   COPD (chronic obstructive pulmonary disease) (HCC)   Bipolar 1 disorder (HCC)   Non-ST elevation (NSTEMI) myocardial infarction ([place])   AKI (acute kidney injury) ([place])   Stage 3a chronic kidney disease (HCC)   Type II diabetes mellitus with renal manifestations (HCC)   Anemia of chronic disease   Grade III hemorrhoids   Atherosclerosis of native coronary artery of native heart without angina pectoris   Dyspnea on exertion    Discharge Instructions  Discharge Instructions     Diet - low sodium heart healthy   Complete by: As directed    Discharge instructions   Complete by: As directed    1)Please take prescribed medications as instructed 2)Follow up with cardiology on 10/01/2020 at 10:30 AM 3)follow up with your PCP in a week.  Do a CBC, BMP just during the follow-up  4)Monitor your blood sugars at home   Increase activity slowly   Complete by: As directed       Allergies as of 09/18/2020   No Known Allergies      Medication List     STOP taking these medications    amLODipine 10 MG tablet Commonly known as: NORVASC   carvedilol 25 MG tablet Commonly known as: COREG   famotidine 20 MG tablet Commonly known as: PEPCID   heparin 25000 UT/250ML infusion   hydrALAZINE 25 MG tablet Commonly known as: APRESOLINE       TAKE these medications    albuterol 108 (90 Base) MCG/ACT inhaler Commonly known as: VENTOLIN HFA Inhale 2 puffs into the lungs every 6 (six) hours as needed for wheezing.   amoxicillin-clavulanate 500-125 MG tablet Commonly known as: Augmentin Take 1 tablet (500 mg total) by mouth 2 (two) times daily for 4 days.   aspirin 81 MG EC tablet Take 1 tablet (81 mg total) by mouth daily. Swallow whole.   atorvastatin 40 MG tablet Commonly known as: LIPITOR Take 40 mg by mouth daily.   brexpiprazole 1 MG Tabs tablet Commonly  known as: REXULTI Take 1 tablet (1 mg total) by mouth daily.   budesonide-formoterol 160-4.5 MCG/ACT inhaler Commonly known as: Symbicort Inhale 2 puffs into the lungs 2 (two) times daily.   clopidogrel 75 MG tablet Commonly known as: PLAVIX Take 1 tablet (75 mg total) by mouth daily.   cyanocobalamin 1000 MCG tablet Take 1 tablet (1,000 mcg total) by mouth daily.   feeding supplement (NEPRO CARB STEADY) Liqd Take 237 mLs by mouth 2 (two) times daily between meals.   folic acid 1 MG tablet Commonly known as: FOLVITE Take 1 tablet (1 mg total) by mouth daily.   furosemide 20 MG tablet Commonly known as: Lasix Take 1 tablet (20 mg total) by mouth daily.   gabapentin 300 MG capsule Commonly known as: Neurontin Take 1 capsule (300 mg total) by mouth at bedtime.   hydrocortisone 25 MG suppository Commonly known as: ANUSOL-HC Place 1 suppository (25 mg total) rectally 2 (two) times daily.   insulin glargine-yfgn 100 UNIT/ML injection Commonly known as: SEMGLEE Inject 0.18 mLs (18 Units total) into the skin at bedtime.   insulin lispro 100 UNIT/ML KwikPen Commonly known as: HUMALOG Inject 3 Units into the skin 3 (three) times daily before meals.   Insulin Pen Needle 32G X 4 MM Misc 1 Dose by Does not apply route 4 (four) times daily -  before meals  and at bedtime.   ipratropium-albuterol 0.5-2.5 (3) MG/3ML Soln Commonly known as: DUONEB Take 3 mLs by nebulization 3 (three) times daily as needed.   isosorbide-hydrALAZINE 20-37.5 MG tablet Commonly known as: BIDIL Take 2 tablets by mouth 3 (three) times daily.   metoCLOPramide 10 MG tablet Commonly known as: REGLAN Take 1 tablet (10 mg total) by mouth every 6 (six) hours as needed.   metoprolol tartrate 25 MG tablet Commonly known as: LOPRESSOR Take 0.5 tablets (12.5 mg total) by mouth 2 (two) times daily.   mirtazapine 15 MG tablet Commonly known as: REMERON Take 15 mg by mouth at bedtime.   pantoprazole 40 MG  tablet Commonly known as: PROTONIX Take 1 tablet (40 mg total) by mouth 2 (two) times daily.   potassium chloride SA 20 MEQ tablet Commonly known as: KLOR-CON Take 0.5 tablets (10 mEq total) by mouth daily.   senna-docusate 8.6-50 MG tablet Commonly known as: Senokot-S Take 1 tablet by mouth 2 (two) times daily.               Durable Medical Equipment  (From admission, onward)           Start     Ordered   09/17/20 1207  For home use only DME Shower stool  Once        09/17/20 1206   09/17/20 1058  For home use only DME 4 wheeled rolling walker with seat  Once       Question:  Patient needs a walker to treat with the following condition  Answer:  Weakness   09/17/20 1057   09/17/20 1016  For home use only DME Nebulizer machine  Once       Question Answer Comment  Patient needs a nebulizer to treat with the following condition Acute respiratory failure with hypoxia ([place])   Length of Need Lifetime      09/17/20 1015   09/12/20 1258  For home use only DME 3 n 1  Once        09/12/20 1258            Follow-up Information     [name], NP Follow up.   Specialty: Family Medicine Why: hospital discharge follow up, diabetes management pcp to refer you to endocrinology for uncontrolled diabetes pcp to refer you to cardiology if you move to [place] Contact information: [place] [id number] [phone]         Lawerance Cruel C, PA-C Follow up on 10/01/2020.   Specialty: Cardiology Why: Appointment 10/01/20 at 10:30 AM. Please bring all medications with you. Contact information: [place] [address] [phone]         [employer] Follow up.   Why: neb machine, rollator, shower stool, home oxygen Contact information: [phone]        [facility] [place] Follow up.   Why: HHPT, you can contact [place] at [phone] for any questions Contact information: [place] [address] [phone]                No Known Allergies  Consultations: Cardiology   Procedures/Studies: DG Chest 2 View  Result Date: 09/03/2020 CLINICAL DATA:  Shortness of breath.  Abdominal and back pain. EXAM: CHEST - 2 VIEW COMPARISON:  08/20/2020 FINDINGS: Borderline cardiomegaly with bilateral interstitial accentuation including some mild Kerley B lines, increased from 08/20/2020. Atherosclerotic calcification of the  aortic arch. Trace fluid in the minor fissure. No blunting of the costophrenic angles. Chronic calcified lesion along the right lateral breast/axilla. IMPRESSION: 1. New abnormal interstitial accentuation in the lungs with borderline enlargement of the cardiopericardial silhouette and some Kerley B lines, suspicious for acute cardiogenic interstitial edema. Atypical pneumonia is a possible but less likely differential diagnostic consideration. 2.  Aortic Atherosclerosis (ICD10-I70.0). Electronically Signed   By: Van Clines M.D.   On: 09/03/2020 12:12   CT ABDOMEN PELVIS W CONTRAST  Result Date: 08/20/2020 CLINICAL DATA:  Abdominal pain, nausea, vomiting and hyperglycemia. EXAM: CT ABDOMEN AND PELVIS WITH CONTRAST TECHNIQUE: Multidetector CT imaging of the abdomen and pelvis was performed using the standard protocol following bolus administration of intravenous contrast. CONTRAST:  29m OMNIPAQUE IOHEXOL 300 MG/ML  SOLN COMPARISON:  07/30/2020 FINDINGS: Lower chest: The lung bases are clear of an acute process. No pleural effusions or pulmonary lesions. Heart is within normal limits in size. Aortic and coronary artery calcifications are noted. Hepatobiliary: No hepatic lesions or intrahepatic biliary dilatation. The gallbladder is contracted. No common bile duct dilatation. Pancreas: No mass, inflammation or ductal dilatation. Moderate pancreatic atrophy. Spleen: Normal size. No focal lesions. Adrenals/Urinary Tract: Adrenal glands and  kidneys are unremarkable. No renal lesions or evidence of pyelonephritis. No hydronephrosis. Calcification in the right renal hilum appears to be vascular. The bladder is mildly distended but no bladder mass or wall thickening. Stomach/Bowel: The stomach, duodenum, small bowel and colon are grossly normal without oral contrast. No obvious acute inflammatory process, mass lesions obstructive findings. The terminal ileum is normal. The appendix is normal. Vascular/Lymphatic: Age advanced atherosclerotic calcifications involving the aorta and iliac arteries but no focal aneurysm or dissection. The branch vessels are patent. Branch vessel calcifications are noted. No abdominal/pelvic lymphadenopathy. Reproductive: The uterus demonstrates multiple fibroids, some of which are calcified. The ovaries are unremarkable. Parametrial vascular calcifications. Other: No pelvic mass or adenopathy. No free pelvic fluid collections. No inguinal mass or adenopathy. No abdominal wall hernia or subcutaneous lesions. Musculoskeletal: No significant bony findings. IMPRESSION: 1. No acute abdominal/pelvic findings, mass lesions or adenopathy. 2. Age advanced atherosclerotic calcifications involving the aorta and iliac arteries. 3. Moderate bladder distension. 4. Uterine fibroids. Aortic Atherosclerosis (ICD10-I70.0). Electronically Signed   By: PMarijo SanesM.D.   On: 08/20/2020 05:19   CARDIAC CATHETERIZATION  Result Date: 09/09/2020 Coronary angioplasty to right coronary artery on 09/09/2020: Unsuccessful attempt at PCI to the right coronary artery with attendant complication including extensive dissection involving the entire right coronary artery.  As there was still TIMI I-I 0.5 flow, to prevent retrograde dissection of the ostial RCA into the aorta and also to prevent further antegrade dissection or complete closure of the vessel, a 3.5 x 15 millimeters resolute frontier DES was deployed. Overall extensive dissection is evident  involving the proximal and all the way to the distal RCA with no chance of successful angioplasty.  No devices could not cross after the initial angioplasty at the proximal right coronary artery segment.  1.5 mm balloon could not also cross.  This is in spite of GuideLiner support right from the get go.  Could not pass a heavier wire as well, after initial angioplasty I had attempted to place Iron Man guidewire but I was unable to cross the stenosis probably related to significant calcium and dissection at the focal site. 30 mL of contrast was utilized.  Patient will be admitted to the ICU, I have updated patient's daughter regarding the complication.  CARDIAC CATHETERIZATION  Result Date: 09/07/2020   Prox LAD to Mid LAD lesion is 65% stenosed.   Mid Cx lesion is 40% stenosed.   Mid RCA lesion is 99% stenosed. Conclusion Unsuccessful PCI and stent of proximal RCA because of heavy calcification. Case was aborted and patient was transferred to [facility] for complex PCI and stent possibly including atherectomy There were no complications.   CARDIAC CATHETERIZATION  Result Date: 09/05/2020   Mid RCA lesion is 99% stenosed.   Prox LAD to Mid LAD lesion is 65% stenosed.   Mid Cx lesion is 40% stenosed. Critical lesion in mid RCA, needing PCI/DES, LAd lesion moderate to severe mid LAD treat medically.   US RENAL  Result Date: 09/11/2020 CLINICAL DATA:  Elevated serum creatinine.  Diabetes.  Hypertension EXAM: RENAL / URINARY TRACT ULTRASOUND COMPLETE COMPARISON:  Ultrasound 08/22/2020, CT 08/20/2020 FINDINGS: Right Kidney: Renal measurements: 8.5 x 3.3 x 5.0 cm = volume: 74 mL. Mildly increased renal cortical echogenicity. No mass or hydronephrosis visualized. Left Kidney: Renal measurements: 11.0 x 5.8 x 5.1 cm = volume: 192 mL. Echogenicity within normal limits. No mass or hydronephrosis visualized. Bladder: Appears normal for degree of bladder distention. Other: Trace perihepatic ascites. Right-sided  pleural effusion is partially visualized. IMPRESSION: 1. No evidence of obstructive uropathy. 2. Slightly asymmetrically atrophic right kidney. Mildly increased right renal cortical echogenicity suggesting medical renal disease. 3. Unremarkable sonographic appearance of the left kidney. Electronically Signed   By: Davina Poke D.O.   On: 09/11/2020 12:02   US RENAL  Result Date: 08/22/2020 CLINICAL DATA:  65 year old female with acute renal failure. EXAM: RENAL / URINARY TRACT ULTRASOUND COMPLETE COMPARISON:  CT Abdomen and Pelvis 08/20/2020. FINDINGS: Right Kidney: Renal measurements: 8.6 x 3.0 x 3.7 cm = volume: 50 mL. Renal cortex appears echogenic on image 9. No right hydronephrosis or renal mass/lesion. Left Kidney: Renal measurements: 10.3 x 5.8 x 4.6 cm = volume: 144 mL. Left renal cortical echogenicity appears better preserved. No left hydronephrosis or renal lesion. Bladder: Appears normal for degree of bladder distention. Other: None. IMPRESSION: 1. Partial right renal atrophy with suspicion of asymmetric echogenic cortex raising the possibility of chronic medical renal disease on that side. 2. Left kidney appears more normal for age.  Unremarkable bladder. Electronically Signed   By: Genevie Ann M.D.   On: 08/22/2020 11:01   PERIPHERAL VASCULAR CATHETERIZATION  Result Date: 09/09/2020 Coronary angioplasty to right coronary artery on 09/09/2020: Unsuccessful attempt at PCI to the right coronary artery with attendant complication including extensive dissection involving the entire right coronary artery.  As there was still TIMI I-I 0.5 flow, to prevent retrograde dissection of the ostial RCA into the aorta and also to prevent further antegrade dissection or complete closure of the vessel, a 3.5 x 15 millimeters resolute frontier DES was deployed. Overall extensive dissection is evident involving the proximal and all the way to the distal RCA with no chance of successful angioplasty.  No devices could  not cross after the initial angioplasty at the proximal right coronary artery segment.  1.5 mm balloon could not also cross.  This is in spite of GuideLiner support right from the get go.  Could not pass a heavier wire as well, after initial angioplasty I had attempted to place Iron Man guidewire but I was unable to cross the stenosis probably related to significant calcium and dissection at the focal site. 30 mL of contrast was utilized.  Patient will be admitted to the ICU,  I have updated patient's daughter regarding the complication.   DG CHEST PORT 1 VIEW  Result Date: 09/17/2020 CLINICAL DATA:  Dyspnea. EXAM: PORTABLE CHEST 1 VIEW COMPARISON:  Most recent radiograph 2 days ago 09/15/2020, additional priors reviewed. FINDINGS: Lower lung volumes from prior exam. Cardiomegaly is stable. Unchanged mediastinal contours with aortic atherosclerosis. Retrocardiac opacity is again seen, not significantly changed from recent exam. Suspected small bilateral pleural effusions. Mild chronic peribronchial thickening. No pneumothorax. Chronic calcified density about the right axilla. There is heterogeneous material within the stomach which appears distended, seen on prior exams. IMPRESSION: 1. Lower lung volumes from prior exam. Unchanged retrocardiac opacity, atelectasis versus pneumonia. 2. Suspected small bilateral pleural effusions, new. 3. Stable cardiomegaly and aortic atherosclerosis. Electronically Signed   By: Keith Rake M.D.   On: 09/17/2020 16:21   DG CHEST PORT 1 VIEW  Result Date: 09/15/2020 CLINICAL DATA:  Dyspnea EXAM: PORTABLE CHEST 1 VIEW COMPARISON:  09/08/2020 FINDINGS: There is developing retrocardiac consolidation with air bronchograms now identified possibly related to acute infection or aspiration. No pneumothorax or pleural effusion. Cardiac size is mildly enlarged, unchanged. Pulmonary vascularity is normal. No acute bone abnormality. IMPRESSION: Developing retrocardiac consolidation  Electronically Signed   By: Fidela Salisbury M.D.   On: 09/15/2020 19:38   DG Chest Port 1 View  Result Date: 09/08/2020 CLINICAL DATA:  Shortness of breath for 1 week EXAM: PORTABLE CHEST 1 VIEW COMPARISON:  09/03/2020 FINDINGS: Cardiac shadow is enlarged but stable. The lungs are well aerated bilaterally. No focal infiltrate or sizable effusion is noted. Chronic calcified soft tissue lesion is noted in the right breast stable from the prior exam. No bony abnormality is noted. IMPRESSION: No active disease. Electronically Signed   By: Inez Catalina M.D.   On: 09/08/2020 17:43   DG Chest Portable 1 View  Result Date: 08/20/2020 CLINICAL DATA:  Cough. EXAM: PORTABLE CHEST 1 VIEW COMPARISON:  July 30, 2020 FINDINGS: The lungs are mildly hyperinflated. There is no evidence of and acute infiltrate, pleural effusion or pneumothorax. The heart size and mediastinal contours are within normal limits. A stable rounded opacity is seen overlying the soft tissues of the lateral right chest wall. The visualized skeletal structures are unremarkable. IMPRESSION: Stable exam without acute cardiopulmonary disease. Electronically Signed   By: Virgina Norfolk M.D.   On: 08/20/2020 02:11   ECHOCARDIOGRAM COMPLETE  Result Date: 09/06/2020    ECHOCARDIOGRAM REPORT   Patient Name:   Erica Mercer Date of Exam: 09/06/2020 Medical Rec #:  [id number]        Height:       62.0 in Accession #:    [id number]       Weight:       94.8 lb Date of Birth:  28-Mar-1955        BSA:          1.392 m Patient Age:    65 years         BP:           171/92 mmHg Patient Gender: F                HR:           81 bpm. Exam Location:  Inpatient Procedure: 2D Echo Indications:     NSTEMI  History:         Patient has prior history of Echocardiogram examinations, most  recent 07/31/2020. CAD, COPD and chronic kidney disease; Risk                  Factors:Hypertension, Diabetes and Dyslipidemia.  Sonographer:     Johny Chess RDCS  Referring Phys:  [id number] Lequita Halt Diagnosing Phys: Vernell Leep MD IMPRESSIONS  1. Left ventricular ejection fraction, by estimation, is 55 to 60%. The left ventricle has normal function. The left ventricle has no regional wall motion abnormalities. There is mild left ventricular hypertrophy. Left ventricular diastolic parameters are consistent with Grade I diastolic dysfunction (impaired relaxation).  2. Right ventricular systolic function is normal. The right ventricular size is normal. There is normal pulmonary artery systolic pressure.  3. Left atrial size was mildly dilated.  4. Possible pericardial cyst adjacent to basal inferolateral left ventricle.  5. No significant valvular abnormality. Normal right atrial pressure.  6. No significant change compared to previous study on 07/31/2020. FINDINGS  Left Ventricle: Left ventricular ejection fraction, by estimation, is 55 to 60%. The left ventricle has normal function. The left ventricle has no regional wall motion abnormalities. Definity contrast agent was given IV to delineate the left ventricular  endocardial borders. The left ventricular internal cavity size was normal in size. There is mild left ventricular hypertrophy. Left ventricular diastolic parameters are consistent with Grade I diastolic dysfunction (impaired relaxation). Right Ventricle: The right ventricular size is normal. No increase in right ventricular wall thickness. Right ventricular systolic function is normal. There is normal pulmonary artery systolic pressure. The tricuspid regurgitant velocity is 2.58 m/s, and  with an assumed right atrial pressure of 3 mmHg, the estimated right ventricular systolic pressure is 0000000 mmHg. Left Atrium: Left atrial size was mildly dilated. Right Atrium: Right atrial size was normal in size. Pericardium: Possible pericardial cyst adjacent to basal inferolateral left ventricle. There is no evidence of pericardial effusion. Mitral Valve: The mitral valve  is grossly normal. No evidence of mitral valve regurgitation. Tricuspid Valve: The tricuspid valve is normal in structure. Tricuspid valve regurgitation is not demonstrated. Aortic Valve: The aortic valve is normal in structure. Aortic valve regurgitation is not visualized. Pulmonic Valve: The pulmonic valve was grossly normal. Pulmonic valve regurgitation is trivial. Aorta: The aortic root and ascending aorta are structurally normal, with no evidence of dilitation. Venous: The inferior vena cava is normal in size with greater than 50% respiratory variability, suggesting right atrial pressure of 3 mmHg. IAS/Shunts: No atrial level shunt detected by color flow Doppler.  LEFT VENTRICLE PLAX 2D LVIDd:         4.00 cm  Diastology LVIDs:         2.80 cm  LV e' medial:    5.22 cm/s LV PW:         1.00 cm  LV E/e' medial:  16.2 LV IVS:        1.00 cm  LV e' lateral:   4.46 cm/s LVOT diam:     1.70 cm  LV E/e' lateral: 19.0 LV SV:         46 LV SV Index:   33 LVOT Area:     2.27 cm  RIGHT VENTRICLE RV S prime:     17.70 cm/s TAPSE (M-mode): 2.5 cm LEFT ATRIUM             Index       RIGHT ATRIUM          Index LA diam:        2.90 cm 2.08  cm/m  RA Area:     9.60 cm LA Vol (A2C):   51.4 ml 36.93 ml/m RA Volume:   18.90 ml 13.58 ml/m LA Vol (A4C):   41.5 ml 29.81 ml/m LA Biplane Vol: 47.7 ml 34.27 ml/m  AORTIC VALVE LVOT Vmax:   108.00 cm/s LVOT Vmean:  69.700 cm/s LVOT VTI:    0.203 m  AORTA Ao Root diam: 3.10 cm Ao Asc diam:  3.20 cm MITRAL VALVE                TRICUSPID VALVE MV Area (PHT): 3.72 cm     TR Peak grad:   26.6 mmHg MV Decel Time: 204 msec     TR Vmax:        258.00 cm/s MV E velocity: 84.60 cm/s MV A velocity: 104.00 cm/s  SHUNTS MV E/A ratio:  0.81         Systemic VTI:  0.20 m                             Systemic Diam: 1.70 cm Vernell Leep MD Electronically signed by Vernell Leep MD Signature Date/Time: 09/06/2020/8:43:08 PM    Final       Subjective: Patient seen and examined the  bedside this morning.  Hemodynamically stable for discharge.  Discharge Exam: Vitals:   09/18/20 0700 09/18/20 0837  BP:    Pulse: 88 86  Resp: 20   Temp:    SpO2: 98% (!) 87%   Vitals:   09/18/20 0000 09/18/20 0321 09/18/20 0700 09/18/20 0837  BP: 117/66 132/81    Pulse: 69 71 88 86  Resp: '20 20 20   '$ Temp:  98.5 F (36.9 C)    TempSrc:  Oral    SpO2: 94% 97% 98% (!) 87%  Weight:      Height:        General: Pt is alert, awake, not in acute distress Cardiovascular: RRR, S1/S2 +, no rubs, no gallops Respiratory: CTA bilaterally, no wheezing, no rhonchi Abdominal: Soft, NT, ND, bowel sounds + Extremities: no edema, no cyanosis    The results of significant diagnostics from this hospitalization (including imaging, microbiology, ancillary and laboratory) are listed below for reference.     Microbiology: No results found for this or any previous visit (from the past 240 hour(s)).   Labs: BNP (last 3 results) Recent Labs    09/15/20 0029 09/15/20 1658 09/16/20 0010  BNP 2,034.8* 1,996.1* 0000000*   Basic Metabolic Panel: Recent Labs  Lab 09/14/20 [id number] 09/15/20 0029 09/16/20 0010 09/16/20 0846 09/17/20 0015 09/18/20 0040  NA 138  --  136 136 137 137  K 3.6  --  4.2 4.4 4.8 5.0  CL 106  --  105 103 105 107  CO2 22  --  22 21* 24 23  GLUCOSE 307*  --  317* 516* 286* 223*  BUN 56*  --  59* 64* 70* 78*  CREATININE 1.82*  --  1.90* 2.26* 2.39* 2.41*  CALCIUM 9.1  --  8.5* 8.7* 8.6* 8.6*  MG  --  1.9  --   --   --   --    Liver Function Tests: Recent Labs  Lab 09/13/20 0019  AST 87*  ALT 54*  ALKPHOS 162*  BILITOT 0.8  PROT 4.9*  ALBUMIN 2.1*   No results for input(s): LIPASE, AMYLASE in the last 168 hours. No results for input(s): AMMONIA in the last  168 hours. CBC: Recent Labs  Lab 09/15/20 0029 09/16/20 0010 09/16/20 0846 09/17/20 0015 09/18/20 0040  WBC 9.4 10.5 10.9* 9.7 8.9  NEUTROABS  --  9.7* 9.9* 7.3 6.3  HGB 7.6* 7.7* 8.1* 7.1*  7.5*  HCT 22.7* 23.7* 25.3* 22.2* 23.7*  MCV 97.8 98.3 98.4 99.6 100.0  PLT 291 311 337 321 354   Cardiac Enzymes: Recent Labs  Lab 09/12/20 1217  CKTOTAL 298*   BNP: Invalid input(s): POCBNP CBG: Recent Labs  Lab 09/17/20 1126 09/17/20 1646 09/17/20 2135 09/18/20 0612 09/18/20 1127  GLUCAP 94 210* 329* 105* 147*   D-Dimer No results for input(s): DDIMER in the last 72 hours. Hgb A1c No results for input(s): HGBA1C in the last 72 hours. Lipid Profile No results for input(s): CHOL, HDL, LDLCALC, TRIG, CHOLHDL, LDLDIRECT in the last 72 hours. Thyroid function studies No results for input(s): TSH, T4TOTAL, T3FREE, THYROIDAB in the last 72 hours.  Invalid input(s): FREET3 Anemia work up No results for input(s): VITAMINB12, FOLATE, FERRITIN, TIBC, IRON, RETICCTPCT in the last 72 hours. Urinalysis    Component Value Date/Time   COLORURINE YELLOW 09/12/2020 0332   APPEARANCEUR CLOUDY (A) 09/12/2020 0332   LABSPEC 1.015 09/12/2020 0332   PHURINE 5.0 09/12/2020 0332   GLUCOSEU NEGATIVE 09/12/2020 0332   HGBUR NEGATIVE 09/12/2020 0332   BILIRUBINUR NEGATIVE 09/12/2020 0332   KETONESUR NEGATIVE 09/12/2020 0332   PROTEINUR 100 (A) 09/12/2020 0332   NITRITE NEGATIVE 09/12/2020 0332   LEUKOCYTESUR NEGATIVE 09/12/2020 0332   Sepsis Labs Invalid input(s): PROCALCITONIN,  WBC,  LACTICIDVEN Microbiology No results found for this or any previous visit (from the past 240 hour(s)).  Please note: You were cared for by a hospitalist during your hospital stay. Once you are discharged, your primary care physician will handle any further medical issues. Please note that NO REFILLS for any discharge medications will be authorized once you are discharged, as it is imperative that you return to your primary care physician (or establish a relationship with a primary care physician if you do not have one) for your post hospital discharge needs so that they can reassess your need for  medications and monitor your lab values.    Time coordinating discharge: 40 minutes  SIGNED:   Shelly Coss, MD  [facility] 09/18/2020, 1:10 PM Pager [phone]  If 7PM-7AM, please contact night-coverage [email or web address] Password [email or web address]

## 2020-09-17 NOTE — TOC Transition Note (Addendum)
Transition of Care [facility]) - CM/SW Discharge Note   Patient Details  Name: Erica Mercer MRN: [id number] Date of Birth: 1955-06-05  [facility] CM/SW Contact:  Zenon Mayo, RN Phone Number: 09/17/2020, 2:20 PM   Clinical Narrative:    Patient is for dc today, [employer] to deliver the DME , NCM made referral to [facility] with [facility] , she states they are able to take with soc on Wed after patient moves to [facility] on Friday.    16:12- Patient became sob, she will not dc today, will have to check oxygen sats again. If she needs oxygen, will let Jermaine with [employer] know, who is supplying the DME for patient.   8/25-- patient will need home oxygen, this NCM notified Jermaine with [employer], this will be brought to patient's room today.   Final next level of care: [place] Barriers to Discharge: No Barriers Identified   Patient Goals and CMS Choice Patient states their goals for this hospitalization and ongoing recovery are:: home with [facility] CMS Medicare.gov Compare Post Acute Care list provided to:: Patient Choice offered to / list presented to : Patient  Discharge Placement                       Discharge Plan and Services   Discharge Planning Services: CM Consult Post Acute Care Choice: Home Health          DME Arranged: Nebulizer machine, Walker rolling with seat, Shower stool, Oxygen DME Agency: Other - Comment [employer], [employer]) Date DME Agency Contacted: 09/17/20 Time DME Agency Contacted: 1229 Representative spoke with at DME Agency: Brenton Grills HH Arranged: PT [place]: [place] Date [place]: 09/17/20 Time [place]: [place] Representative spoke with at [place]: [place] ([place]) Interventions     Readmission Risk Interventions Readmission Risk Prevention Plan 09/17/2020 08/21/2020  Transportation Screening Complete Complete  Medication Review Human resources officer) Complete Complete  PCP or Specialist appointment within 3-5 days of discharge Complete Complete  HRI or [place] Complete Complete  SW Recovery Care/Counseling Consult Complete Complete  Palliative Care Screening Not Applicable Not [place] Not Applicable Not Applicable

## 2020-09-17 NOTE — Progress Notes (Signed)
Patient was off oxygen for appx 2 hours, had complaints of shortness of breath, gave neb tx which helped for about 2 hours, before patient complained of shortness of breath again, oxygen given at 4L before patient's breathing was good, on room air in bed she sats at 84%, after 4L oxygen in bed, she is 95%, per Dr. Tawanna Solo, we will cancel discharge for today and Charlene/Cardiac Rehab advised that patient will need to have another oxygen walk study tomorrow for re evaluation of oxygen needs before discharge to home, Deborah/CM advised in text chat also

## 2020-09-17 NOTE — TOC Initial Note (Addendum)
Transition of Care [facility]) - Initial/Assessment Note    Patient Details  Name: Erica Mercer MRN: [id number] Date of Birth: 1955/06/15  [facility]) CM/SW Contact:    Zenon Mayo, RN Phone Number: 09/17/2020, 12:31 PM  Clinical Narrative:                 NCM spoke with patient , she is for dc today, she will need neb machine, shower stoll, and rollator.  She is ok with [employer] supplying DME.  NCM made referral to [employer]  with Jermaine.  She will also need HHPT set up.  NCM offered choice, she states she does not have a preference. NCM made referral to [facility] with [facility], looks like she just had them recently.  NCM also made referral to [facility] with [facility].  Awaiting call back to see which agency can take referral.  Patient states she is moving to [facility] on Friday to address of  [address], [place] [address].  Her phone is [phone].  She will need transportation home in [place] today at [employer]. Per RN patient does not need home oxygen.   Expected Discharge Plan: [place] Barriers to Discharge: No Barriers Identified   Patient Goals and CMS Choice Patient states their goals for this hospitalization and ongoing recovery are:: home with [facility] CMS Medicare.gov Compare Post Acute Care list provided to:: Patient Choice offered to / list presented to : Patient  Expected Discharge Plan and Services Expected Discharge Plan: [place]   Discharge Planning Services: CM Consult Post Acute Care Choice: [place] arrangements for the past 2 months: Single Family Home Expected Discharge Date: 09/17/20               DME Arranged: Nebulizer machine, Walker rolling with seat, Shower stool DME Agency: Other - Comment [employer]) Date DME Agency Contacted: 09/17/20 Time DME Agency Contacted: 1229 Representative spoke with at DME Agency: Brenton Grills HH Arranged: PT          Prior Living Arrangements/Services Living arrangements  for the past 2 months: [place] Lives with:: Adult Children Patient language and need for interpreter reviewed:: Yes Do you feel safe going back to the place where you live?: Yes      Need for Family Participation in Patient Care: No (Comment) Care giver support system in place?: No (comment) Current home services:  (walker , 3 n 1) Criminal Activity/Legal Involvement Pertinent to Current Situation/Hospitalization: No - Comment as needed  Activities of Daily Living Home Assistive Devices/Equipment: Cane (specify quad or straight), Walker (specify type) ADL Screening (condition at time of admission) Patient's cognitive ability adequate to safely complete daily activities?: Yes Is the patient deaf or have difficulty hearing?: No Does the patient have difficulty seeing, even when wearing glasses/contacts?: No Does the patient have difficulty concentrating, remembering, or making decisions?: No Patient able to express need for assistance with ADLs?: Yes Does the patient have difficulty dressing or bathing?: No Independently performs ADLs?: Yes (appropriate for developmental age) Does the patient have difficulty walking or climbing stairs?: Yes Weakness of Legs: None Weakness of Arms/Hands: None  Permission Sought/Granted                  Emotional Assessment   Attitude/Demeanor/Rapport: Engaged Affect (typically observed): Appropriate Orientation: : Oriented to Self, Oriented to Place, Oriented to  Time, Oriented to Situation Alcohol / Substance Use: Not Applicable Psych Involvement: No (comment)  Admission diagnosis:  NSTEMI (non-ST elevated myocardial infarction) [facility]) [I21.4] Patient Active Problem List   Diagnosis Date Noted   Dyspnea on exertion    Atherosclerosis of native coronary artery of native heart without angina pectoris    Anemia of chronic disease 09/11/2020   Grade III hemorrhoids    NSTEMI (non-ST elevated myocardial infarction) ([place]) 09/05/2020    Bipolar depression ([place]) 09/04/2020   Type II diabetes mellitus with renal manifestations ([place]) 08/20/2020   Nausea vomiting and diarrhea 08/20/2020   Protein-calorie malnutrition, moderate ([place]) 08/20/2020   CKD (chronic kidney disease), stage IIIa 08/20/2020   Vaginal itching 08/20/2020   Type 2 diabetes mellitus with hypoglycemia without coma, with long-term current use of insulin (HCC)    Elevated troponin    Weakness    Lactic acidosis    Hyponatremia [id number]   Acute metabolic encephalopathy [id number]   Hematemesis of unknown etiology 07/30/2020   Meningitis 07/30/2020   Stage 3a chronic kidney disease (HCC)    CAD (coronary artery disease) 05/05/2020   Hyperosmolar hyperglycemic state (HHS) ([employer]) 05/05/2020   Abdominal pain 05/05/2020   GERD (gastroesophageal reflux disease) 05/05/2020   Tobacco abuse 05/05/2020   Left arm numbness 05/05/2020   Depression 05/05/2020   Hyperglycemia    AKI (acute kidney injury) ([place]) 04/30/2020   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[place]) 04/30/2020   Sepsis ([place]) 04/30/2020   Acute renal failure superimposed on stage 3a chronic kidney disease ([place])    Severe protein-calorie malnutrition ([place]) 02/25/2020   Essential hypertension 02/25/2020   HLD (hyperlipidemia) 02/25/2020   Soft tissue mass 02/25/2020   H/O cocaine abuse ([place]) 02/17/2018   Bipolar 1 disorder ([place]) 04/22/2016   Non-ST elevation (NSTEMI) myocardial infarction ([place]) 04/22/2016   COPD (chronic obstructive pulmonary disease) ([place]) 01/22/2016   PCP:  [name], NP Pharmacy:   [id number] - [address], [place] AT [address] [place] [address] Phone: [phone] Fax: [phone]     Social Determinants of Health (SDOH) Interventions    Readmission Risk Interventions Readmission Risk Prevention Plan 09/17/2020 08/21/2020  Transportation Screening Complete Complete  Medication Review (RN  Care Manager) Complete Complete  PCP or Specialist appointment within 3-5 days of discharge Complete Complete  HRI or Home Care Consult Complete Complete  SW Recovery Care/Counseling Consult Complete Complete  Palliative Care Screening Not Applicable Not Applicable  Skilled Nursing Facility Not Applicable Not Applicable

## 2020-09-17 NOTE — Progress Notes (Addendum)
CARDIAC REHAB PHASE I   PRE:  Rate/Rhythm: off monitor    SaO2: 3L 96%    93% RA  MODE:  Ambulation: 410 ft   POST:  Rate/Rhythm: 83 per pulse ox  BP:  Supine:   Sitting: 134/83  Standing:    SaO2: 92-94%RA  0930-1030 Pt walked 410 ft on RA with rollator and sat once to rest. Pt does appear DOE but sats maintained above 90% on RA. Reminded pt to keep close to rollator when walking and she locked it correctly prior to sitting. Pt would like rollator for home use. Pt requested that oxygen be reapplied after walk even though sat at 93% as she felt SOB. Pt requesting home oxygen as she feels SOB when sats above 90%.   There is qualifying note from 2 days ago when pt's sats dropped with walking.  Graylon Good, RN BSN  09/17/2020 10:25 AM

## 2020-09-17 NOTE — Care Management Important Message (Signed)
Important Message  Patient Details  Name: Erica Mercer MRN: [id number] Date of Birth: 06-11-1955   Medicare Important Message Given:  Yes     Memory Argue 09/17/2020, 3:45 PM

## 2020-09-18 LAB — CBC WITH DIFFERENTIAL/PLATELET
Abs Immature Granulocytes: 0.04 10*3/uL (ref 0.00–0.07)
Basophils Absolute: 0 10*3/uL (ref 0.0–0.1)
Basophils Relative: 0 %
Eosinophils Absolute: 0.1 10*3/uL (ref 0.0–0.5)
Eosinophils Relative: 1 %
HCT: 23.7 % — ABNORMAL LOW (ref 36.0–46.0)
Hemoglobin: 7.5 g/dL — ABNORMAL LOW (ref 12.0–15.0)
Immature Granulocytes: 0 %
Lymphocytes Relative: 19 %
Lymphs Abs: 1.7 10*3/uL (ref 0.7–4.0)
MCH: 31.6 pg (ref 26.0–34.0)
MCHC: 31.6 g/dL (ref 30.0–36.0)
MCV: 100 fL (ref 80.0–100.0)
Monocytes Absolute: 0.8 10*3/uL (ref 0.1–1.0)
Monocytes Relative: 9 %
Neutro Abs: 6.3 10*3/uL (ref 1.7–7.7)
Neutrophils Relative %: 71 %
Platelets: 354 10*3/uL (ref 150–400)
RBC: 2.37 MIL/uL — ABNORMAL LOW (ref 3.87–5.11)
RDW: 16.8 % — ABNORMAL HIGH (ref 11.5–15.5)
WBC: 8.9 10*3/uL (ref 4.0–10.5)
nRBC: 0 % (ref 0.0–0.2)

## 2020-09-18 LAB — BASIC METABOLIC PANEL
Anion gap: 7 (ref 5–15)
BUN: 78 mg/dL — ABNORMAL HIGH (ref 8–23)
CO2: 23 mmol/L (ref 22–32)
Calcium: 8.6 mg/dL — ABNORMAL LOW (ref 8.9–10.3)
Chloride: 107 mmol/L (ref 98–111)
Creatinine, Ser: 2.41 mg/dL — ABNORMAL HIGH (ref 0.44–1.00)
GFR, Estimated: 22 mL/min — ABNORMAL LOW (ref >60)
Glucose, Bld: 223 mg/dL — ABNORMAL HIGH (ref 70–99)
Potassium: 5 mmol/L (ref 3.5–5.1)
Sodium: 137 mmol/L (ref 135–145)

## 2020-09-18 LAB — GLUCOSE, CAPILLARY
Glucose-Capillary: 105 mg/dL — ABNORMAL HIGH (ref 70–99)
Glucose-Capillary: 147 mg/dL — ABNORMAL HIGH (ref 70–99)

## 2020-09-18 MED ORDER — AMOXICILLIN-POT CLAVULANATE 500-125 MG PO TABS
1.0000 | ORAL_TABLET | Freq: Two times a day (BID) | ORAL | Status: DC
  Administered 2020-09-18: 500 mg via ORAL
  Filled 2020-09-18 (×2): qty 1

## 2020-09-18 MED ORDER — METOPROLOL TARTRATE 25 MG PO TABS: 12.5000 mg | ORAL_TABLET | Freq: Two times a day (BID) | ORAL | 0 refills | Status: AC

## 2020-09-18 MED ORDER — AMOXICILLIN-POT CLAVULANATE 875-125 MG PO TABS: 1.0000 | ORAL_TABLET | Freq: Two times a day (BID) | ORAL | Status: DC

## 2020-09-18 NOTE — Progress Notes (Signed)
SATURATION QUALIFICATIONS: (This note is used to comply with regulatory documentation for home oxygen)  Patient Saturations on Room Air at Rest = 97%  Patient Saturations on Room Air while Ambulating = 87%  Patient Saturations on 2 Liters of oxygen while Ambulating = 94%  Please briefly explain why patient needs home oxygen: Pt with desaturation on RA with ambulation and requires 2L to maintain >90% SPO2 with activity. [place] Pager: [phone] Office: [phone]

## 2020-09-18 NOTE — Progress Notes (Signed)
PROGRESS NOTE    Erica Mercer  [id number] DOB: [birth year] DOA: 09/05/2020 PCP: [name], NP   Chief Complain:  Brief Narrative: Patient is a 53 old female with history of insulin-dependent diabetes, hypertension, hyperlipidemia, chronic anemia, cigarette smoker, bipolar disorder who presented with shortness of breath and chest pain.  She underwent left heart cath at [facility] which showed 90% RCAl stenosis with severe calcification, unable to be ballooned and was  transferred to [facility] for further intervention of RCA.  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 Currently she is hemodynamically stable.  Hospital course also remarkable for anemia requiring blood transfusion suspected from hemorrhoids for which GI recommended conservative management.  PT recommended home health on discharge.  Hospital course also remarkable for acute hypoxic respiratory failure requiring oxygen, she qualified for home oxygen.  She is medically stable for discharge today.   Assessment & Plan:   Principal Problem:   NSTEMI (non-ST elevated myocardial infarction) ([place]) Active Problems:   Severe protein-calorie malnutrition (HCC)   Essential hypertension   HLD (hyperlipidemia)   COPD (chronic obstructive pulmonary disease) (HCC)   Bipolar 1 disorder (HCC)   Non-ST elevation (NSTEMI) myocardial infarction ([place])   AKI (acute kidney injury) ([place])   Stage 3a chronic kidney disease (HCC)   Type II diabetes mellitus with renal manifestations (HCC)   Anemia of chronic disease   Grade III hemorrhoids   Atherosclerosis of native coronary artery of native heart without angina pectoris   Dyspnea on exertion   NSTEMI:  She underwent left heart cath at [facility] which showed 90% RCAl stenosis with severe calcification, unable to be ballooned and was  transferred  to [facility] for further intervention of RCA.  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  Cardiology  were following.  Currently on aspirin and Brilinta.  Continue metoprolol. Cardiology recommending repeat cardiac catheter to reevaluate both LAD stenosis and RCA stenosis as an outpatient in 4 to 6 weeks.  Currently chest pain-free.She will follow up at Dr Irven Shelling office   Acute hypoxic respiratory failure/aspiration PNA: Likely multifactorial from PNA/COPD/CHF.   CXR done on 8/22 showed developing retrocardiac consolidation with air bronchograms  possibly related to acute infection or aspiration.Started on unasyn for possible aspiration,changed to augmentin on DC.  She qualified for home oxygen   Lower GI bleed/acute on chronic normocytic: Suspected to be from hemorrhoids.  She required blood transfusion during this hospitalization.  GI was consulted in this admission, recommended conservative management.  Hemoglobin stable in the range of 7 today   Diastolic congestive heart failure/dyspnea on exertion: She presented with bilateral lower extremity edema.  Has Severely elevated BNP.  Cardiology started on  IV Lasix,now on hold. She will be discharged on Lasix 20 mg daily at home   Hypertension: Currently stable.  Continue current medications.   AKI on CKD stage 3b: Renal ultrasound did not show any obstruction.  Avoid nephrotoxins.  Creatinine started trending up.  IV Lasix held.  Currently appears euvolemic.  Follow-up BMP in a week by following with the PCP.   Insulin-dependent diabetes type 2: Uncontrolled with hemoglobin A1c of 15.4.  Likely from noncompliance.  Has been admitted multiple times for DKA this year.Continue current insulin regimen.  She is to follow-up with her PCP closely/endocrinologist.     History of  CopD: Found to be wheezing on 8/22..  Currently on 3 to 4 L of oxygen per  minute.  She is not on oxygen at home.  Continue bronchodilators: Albuterol, Symbicort.  We will also discharge her with home nebulization machine.  She has been counseled to stop smoking   Constipation: Continue bowel regimen   Debility/deconditioning: Patient seen by PT/OT recommended home health on discharge      Nutrition Problem: Severe Malnutrition Etiology: chronic illness      DVT prophylaxis:SCD Code Status: Full Family Communication: None at bedside Status is: Inpatient  Remains inpatient appropriate because:Inpatient level of care appropriate due to severity of illness  Dispo: The patient is from: Home              Anticipated d/c is to: Home               Difficult to place patient No     Consultants:  Cardiology  Procedures: Cardiac cath  Antimicrobials:  Anti-infectives (From admission, onward)    Start     Dose/Rate Route Frequency Ordered Stop   09/18/20 1300  amoxicillin-clavulanate (AUGMENTIN) 500-125 MG per tablet 500 mg        1 tablet Oral 2 times daily 09/18/20 1213     09/18/20 1245  amoxicillin-clavulanate (AUGMENTIN) 875-125 MG per tablet 1 tablet  Status:  Discontinued        1 tablet Oral Every 12 hours 09/18/20 1146 09/18/20 1213   09/17/20 0000  amoxicillin-clavulanate (AUGMENTIN) 875-125 MG tablet  Status:  Discontinued        1 tablet Oral 2 times daily 09/17/20 1024 09/17/20    09/17/20 0000  amoxicillin-clavulanate (AUGMENTIN) 500-125 MG tablet        1 tablet Oral 2 times daily 09/17/20 1109 09/21/20 2359   09/16/20 1500  Ampicillin-Sulbactam (UNASYN) 3 g in sodium chloride 0.9 % 100 mL IVPB  Status:  Discontinued        3 g 200 mL/hr over 30 Minutes Intravenous Every 12 hours 09/16/20 1356 09/18/20 1146   09/09/20 1448  ceFAZolin (ANCEF) IVPB 2 g/50 mL premix        over 30 Minutes  Continuous PRN 09/09/20 1448 09/09/20 1448       Subjective: Patient seen and examined the bedside this morning.  Discharge canceled yesterday  because she had an episode of acute respiratory distress.  Chest x-ray did not show any new findings except for small bilateral pleural effusion.  She qualified for home oxygen.  Stable for discharge today.  Objective: Vitals:   09/18/20 0000 09/18/20 0321 09/18/20 0700 09/18/20 0837  BP: 117/66 132/81    Pulse: 69 71 88 86  Resp: '20 20 20   '$ Temp:  98.5 F (36.9 C)    TempSrc:  Oral    SpO2: 94% 97% 98% (!) 87%  Weight:      Height:        Intake/Output Summary (Last 24 hours) at 09/18/2020 1311 Last data filed at 09/18/2020 0500 Gross per 24 hour  Intake 294.55 ml  Output --  Net 294.55 ml   Filed Weights   09/12/20 0300 09/14/20 0300 09/15/20 0100  Weight: 55.7 kg 54.4 kg 52.8 kg    Examination:  General exam: Overall comfortable, not in distress, deconditioned, chronically ill looking HEENT: PERRL Respiratory system:  dimished air sounds bilaterally.no wheezes or crackles  Cardiovascular system: S1 & S2 heard, RRR.  Gastrointestinal system: Abdomen is nondistended, soft and nontender. Central nervous  system: Alert and oriented Extremities: No edema, no clubbing ,no cyanosis Skin: No rashes, no ulcers,no icterus      Data Reviewed: I have personally reviewed following labs and imaging studies  CBC: Recent Labs  Lab 09/15/20 0029 09/16/20 0010 09/16/20 0846 09/17/20 0015 09/18/20 0040  WBC 9.4 10.5 10.9* 9.7 8.9  NEUTROABS  --  9.7* 9.9* 7.3 6.3  HGB 7.6* 7.7* 8.1* 7.1* 7.5*  HCT 22.7* 23.7* 25.3* 22.2* 23.7*  MCV 97.8 98.3 98.4 99.6 100.0  PLT 291 311 337 321 [id number]   Basic Metabolic Panel: Recent Labs  Lab 09/14/20 0807 09/15/20 0029 09/16/20 0010 09/16/20 0846 09/17/20 0015 09/18/20 0040  NA 138  --  136 136 137 137  K 3.6  --  4.2 4.4 4.8 5.0  CL 106  --  105 103 105 107  CO2 22  --  22 21* 24 23  GLUCOSE 307*  --  317* 516* 286* 223*  BUN 56*  --  59* 64* 70* 78*  CREATININE 1.82*  --  1.90* 2.26* 2.39* 2.41*  CALCIUM 9.1  --  8.5* 8.7* 8.6*  8.6*  MG  --  1.9  --   --   --   --    GFR: Estimated Creatinine Clearance: 18.4 mL/min (A) (by C-G formula based on SCr of 2.41 mg/dL (H)). Liver Function Tests: Recent Labs  Lab 09/13/20 0019  AST 87*  ALT 54*  ALKPHOS 162*  BILITOT 0.8  PROT 4.9*  ALBUMIN 2.1*   No results for input(s): LIPASE, AMYLASE in the last 168 hours. No results for input(s): AMMONIA in the last 168 hours. Coagulation Profile: No results for input(s): INR, PROTIME in the last 168 hours. Cardiac Enzymes: Recent Labs  Lab 09/12/20 1217  CKTOTAL 298*   BNP (last 3 results) No results for input(s): PROBNP in the last 8760 hours. HbA1C: No results for input(s): HGBA1C in the last 72 hours. CBG: Recent Labs  Lab 09/17/20 1126 09/17/20 1646 09/17/20 2135 09/18/20 0612 09/18/20 1127  GLUCAP 94 210* 329* 105* 147*   Lipid Profile: No results for input(s): CHOL, HDL, LDLCALC, TRIG, CHOLHDL, LDLDIRECT in the last 72 hours. Thyroid Function Tests: No results for input(s): TSH, T4TOTAL, FREET4, T3FREE, THYROIDAB in the last 72 hours. Anemia Panel: No results for input(s): VITAMINB12, FOLATE, FERRITIN, TIBC, IRON, RETICCTPCT in the last 72 hours.  Sepsis Labs: No results for input(s): PROCALCITON, LATICACIDVEN in the last 168 hours.   No results found for this or any previous visit (from the past 240 hour(s)).       Radiology Studies: DG CHEST PORT 1 VIEW  Result Date: 09/17/2020 CLINICAL DATA:  Dyspnea. EXAM: PORTABLE CHEST 1 VIEW COMPARISON:  Most recent radiograph 2 days ago 09/15/2020, additional priors reviewed. FINDINGS: Lower lung volumes from prior exam. Cardiomegaly is stable. Unchanged mediastinal contours with aortic atherosclerosis. Retrocardiac opacity is again seen, not significantly changed from recent exam. Suspected small bilateral pleural effusions. Mild chronic peribronchial thickening. No pneumothorax. Chronic calcified density about the right axilla. There is heterogeneous  material within the stomach which appears distended, seen on prior exams. IMPRESSION: 1. Lower lung volumes from prior exam. Unchanged retrocardiac opacity, atelectasis versus pneumonia. 2. Suspected small bilateral pleural effusions, new. 3. Stable cardiomegaly and aortic atherosclerosis. Electronically Signed   By: Keith Rake M.D.   On: 09/17/2020 16:21        Scheduled Meds:  (feeding supplement) PROSource Plus  30 mL Oral BID BM   amoxicillin-clavulanate  1 tablet Oral BID   aspirin EC  81 mg Oral Daily   atorvastatin  40 mg Oral Daily   brexpiprazole  1 mg Oral Daily   clopidogrel  75 mg Oral Daily   dextrose  25 mL Intravenous Once   famotidine  10 mg Oral QHS   feeding supplement (GLUCERNA SHAKE)  237 mL Oral BID BM   folic acid  1 mg Oral Daily   gabapentin  300 mg Oral QHS   hydrocortisone  25 mg Rectal BID   insulin aspart  0-20 Units Subcutaneous TID WC   insulin aspart  0-5 Units Subcutaneous QHS   insulin aspart  0-5 Units Subcutaneous QHS   insulin aspart  10 Units Subcutaneous TID WC   insulin glargine-yfgn  30 Units Subcutaneous QHS   isosorbide-hydrALAZINE  2 tablet Oral TID   metoprolol tartrate  12.5 mg Oral BID   mirtazapine  15 mg Oral QHS   multivitamin with minerals  1 tablet Oral Daily   nicotine  21 mg Transdermal Daily   pantoprazole  40 mg Oral BID   potassium chloride  10 mEq Oral BID   psyllium  1 packet Oral Daily   senna-docusate  1 tablet Oral BID   sodium chloride flush  3 mL Intravenous Q12H   vitamin B-12  1,000 mcg Oral Daily   Continuous Infusions:  sodium chloride       LOS: 13 days    Time spent:35 mins. More than 50% of that time was spent in counseling and/or coordination of care.      Shelly Coss, MD [facility] P8/25/2022, 1:11 PM

## 2020-09-18 NOTE — Progress Notes (Signed)
Physical Therapy Treatment Patient Details Name: Erica Mercer MRN: [id number] DOB: [birth year] Today's Date: 09/18/2020    History of Present Illness 65 y.o. admitted 09/05/20 with dyspnea and creatinine elevation . 8/16 pt underwent LHC at [facility] which showed 90% RCA stenosis with severe calcification, unable to be ballooned; transfer to [facility] 8/18 for further intervention. Pt also with chronic rectal bleeding with constipation. PMH includes DM, HTN, anemia, bipolar disorder, cigarette smoker.    PT Comments    Pt very pleasant, eager to go home with oxygen. Pt impulsive and quick with transfers and gait needing cues to lock brakes on rollator and be aware for safety. Pt required supplemental oxygen 2L with gait and educated for need to purchase pulse oximeter and monitor SpO2 at home. Pt agreeable and reinforced breathing technique and energy conservation.   HR 86 SpO2 97% at rest on RA, drop to 87% with walking, 94% on 2L with gait    Follow Up Recommendations  No PT follow up;Supervision - Intermittent     Equipment Recommendations  None recommended by PT    Recommendations for Other Services       Precautions / Restrictions Precautions Precautions: Fall;Other (comment) Precaution Comments: watch SpO2    Mobility  Bed Mobility               General bed mobility comments: pt standing on arrival and returned to EOB    Transfers Overall transfer level: Needs assistance   Transfers: Sit to/from Stand Sit to Stand: Supervision         General transfer comment: cues for locking brakes on rollator and to decrease speed as pt impulsive with transfers and mobility  Ambulation/Gait Ambulation/Gait assistance: Supervision Gait Distance (Feet): 250 Feet Assistive device: 4-wheeled walker Gait Pattern/deviations: Step-through pattern;Decreased stride length   Gait velocity interpretation: >4.37 ft/sec, indicative of normal walking speed General Gait Details: pt  dancing with music during gait with rollator for fatigue rather than support. Pt with supervision for safety due to impulsivity but managing without LOB despite quick speed   Stairs             Wheelchair Mobility    Modified Rankin (Stroke Patients Only)       Balance Overall balance assessment: Mild deficits observed, not formally tested   Sitting balance-Leahy Scale: Good       Standing balance-Leahy Scale: Fair                              Cognition Arousal/Alertness: Awake/alert Behavior During Therapy: Impulsive Overall Cognitive Status: Within Functional Limits for tasks assessed                                        Exercises      General Comments        Pertinent Vitals/Pain Pain Assessment: No/denies pain    Home Living                      Prior Function            PT Goals (current goals can now be found in the care plan section) Progress towards PT goals: Progressing toward goals    Frequency    Min 3X/week      PT Plan Current plan remains appropriate    Co-evaluation  AM-PAC PT "6 Clicks" Mobility   Outcome Measure  Help needed turning from your back to your side while in a flat bed without using bedrails?: None Help needed moving from lying on your back to sitting on the side of a flat bed without using bedrails?: None Help needed moving to and from a bed to a chair (including a wheelchair)?: None Help needed standing up from a chair using your arms (e.g., wheelchair or bedside chair)?: A Little Help needed to walk in hospital room?: A Little Help needed climbing 3-5 steps with a railing? : A Little 6 Click Score: 21    End of Session Equipment Utilized During Treatment: Oxygen Activity Tolerance: Patient tolerated treatment well Patient left: in bed;with call bell/phone within reach Nurse Communication: Mobility status PT Visit Diagnosis: Other abnormalities of  gait and mobility (R26.89)     Time: [id number] PT Time Calculation (min) (ACUTE ONLY): 15 min  Charges:  $Gait Training: 8-22 mins                     [place], PT Acute Rehabilitation Services Pager: [phone] Office: [phone]    Sandy Salaam Tareq Dwan 09/18/2020, 8:41 AM

## 2020-09-18 NOTE — Progress Notes (Signed)
1015 PT has walked with pt and qualified for oxygen. Will continue to follow pt. Graylon Good RN BSN 09/18/2020 10:13 AM

## 2020-09-25 LAB — GLUCOSE, CAPILLARY: Glucose-Capillary: 44 mg/dL — CL (ref 70–99)

## 2020-10-01 ENCOUNTER — Ambulatory Visit: Payer: Medicare Other | Admitting: Student

## 2021-10-26 IMAGING — CT CT ABD-PELV W/O CM
2 of 3 series · 17 of 46 positions shown, 19 images · non-contrast
Comparison: 05/21/2020

CLINICAL DATA: Abdominal pain

EXAM:
CT ABDOMEN AND PELVIS WITHOUT CONTRAST
TECHNIQUE: Multidetector CT imaging of the abdomen and pelvis was performed
following the standard protocol without IV contrast.

[Series 3: axial st · axial · 0.68mm/px · z∈[-123,+227]mm · 14 of 82 slices shown, 16 images]
[im 6/82  soft-tissue]
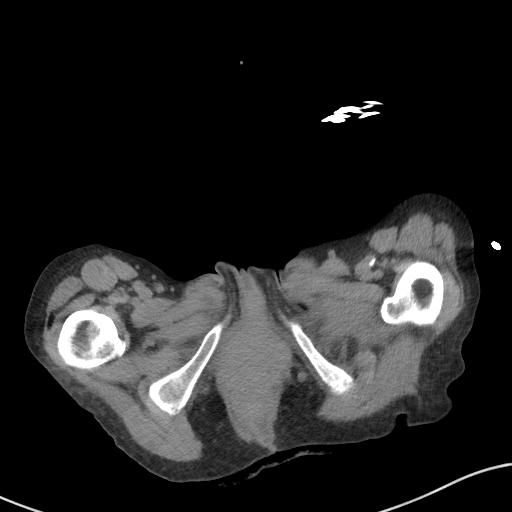
[im 6/82  bone]
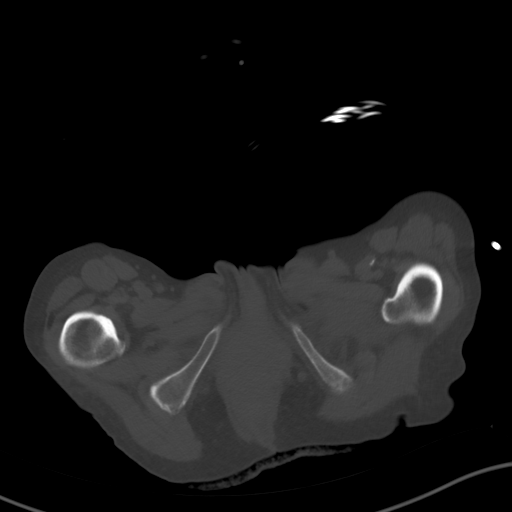
[im 11/82  soft-tissue]
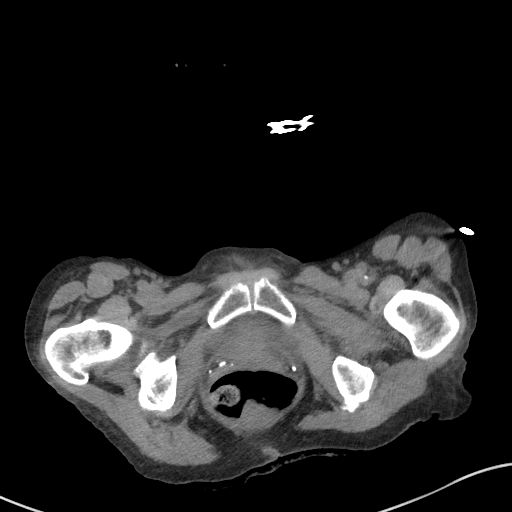
[im 16/82  soft-tissue]
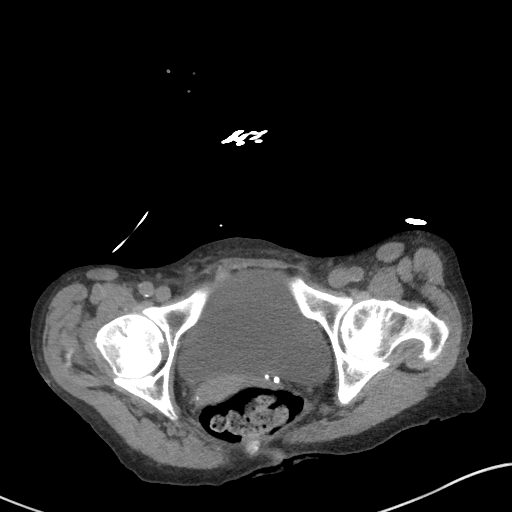
[im 21/82  soft-tissue]
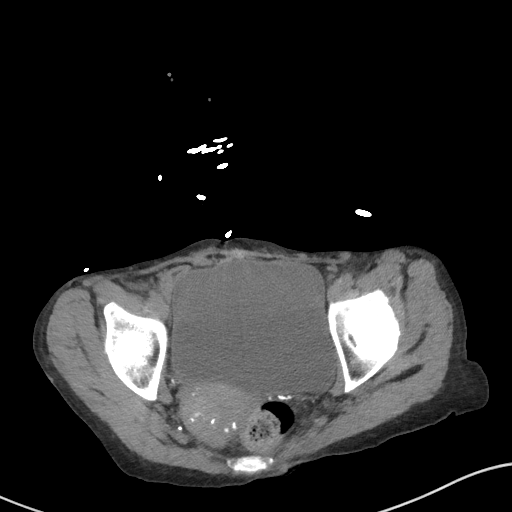
[im 27/82  soft-tissue]
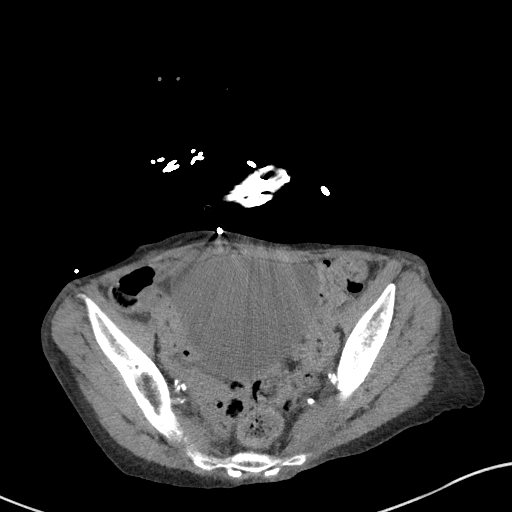
[im 32/82  soft-tissue]
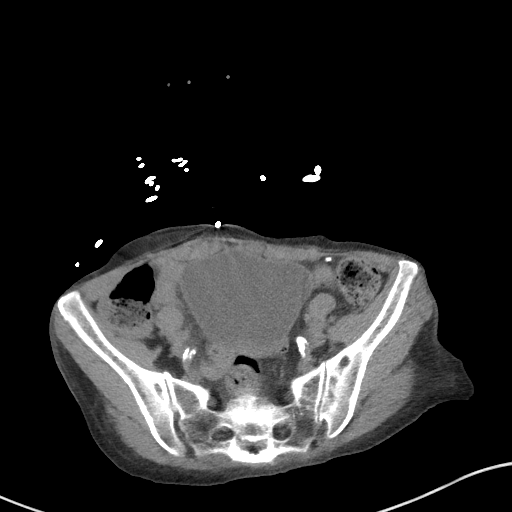
[im 37/82  soft-tissue]
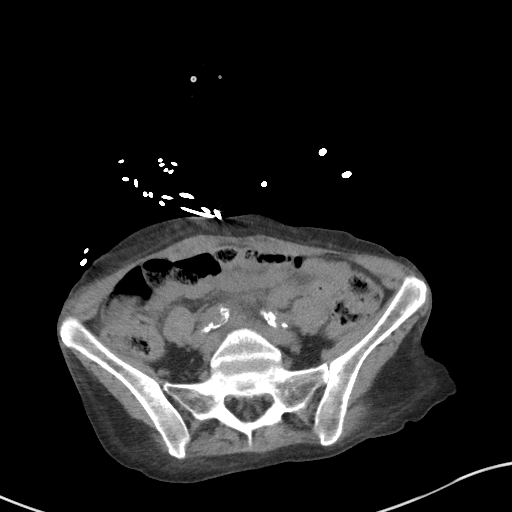
[im 45/82  soft-tissue]
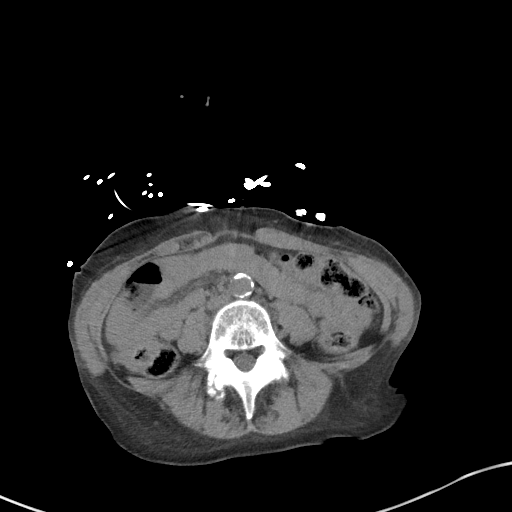
[im 50/82  soft-tissue]
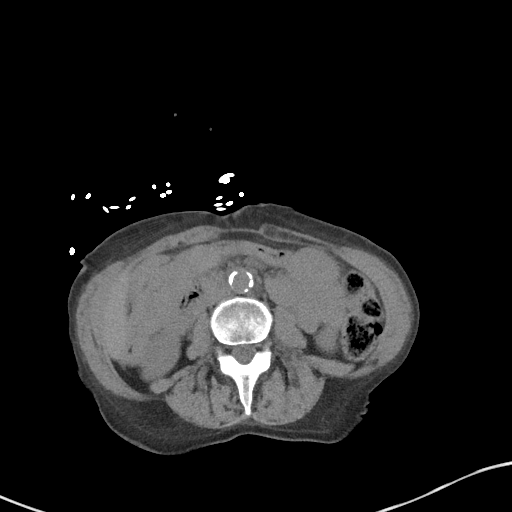
[im 50/82  bone]
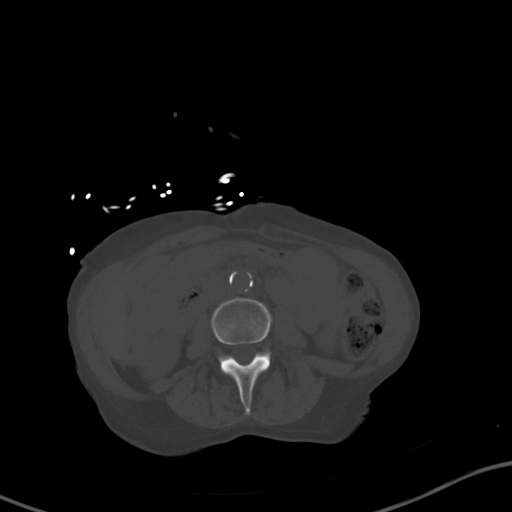
[im 55/82  soft-tissue]
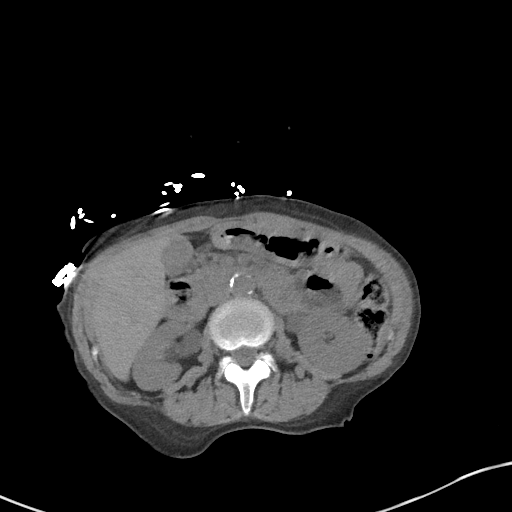
[im 61/82  soft-tissue]
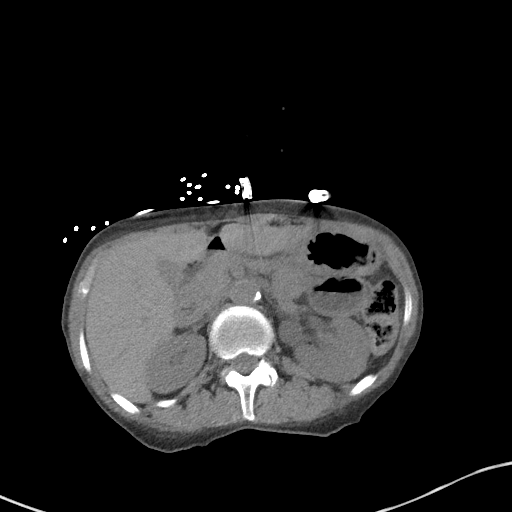
[im 66/82  soft-tissue]
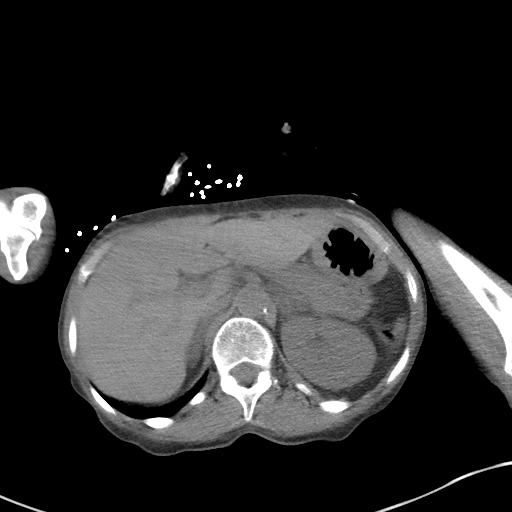
[im 71/82  soft-tissue]
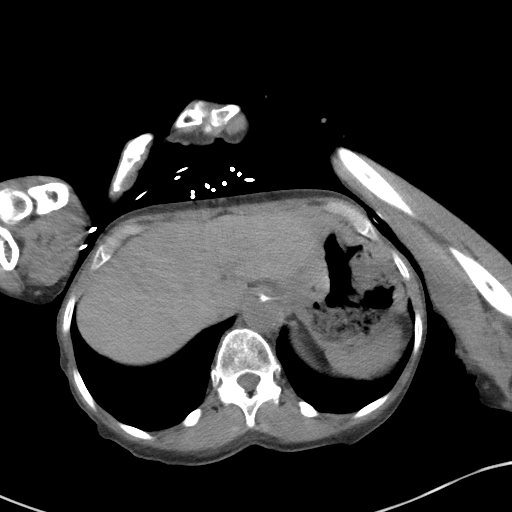
[im 76/82  soft-tissue]
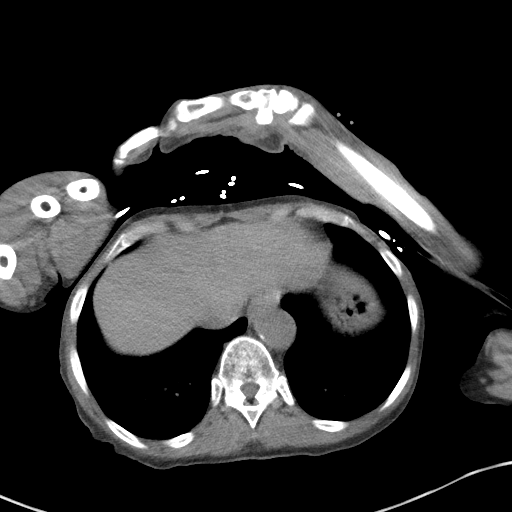

[Series 6: coronal st · coronal · 0.75mm/px · 3 of 71 slices shown]
[im 24/71  soft-tissue]
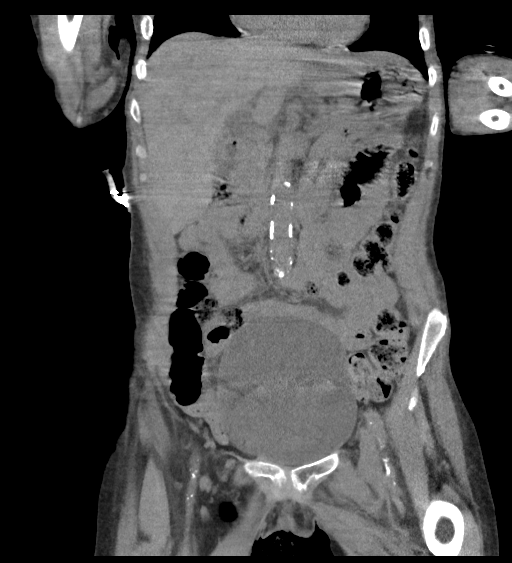
[im 32/71  soft-tissue]
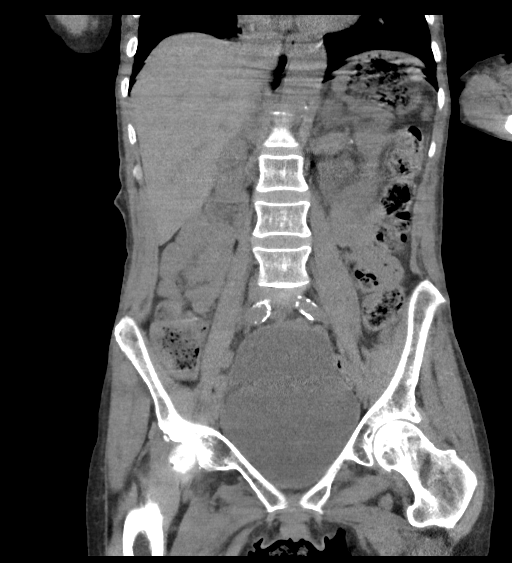
[im 39/71  soft-tissue]
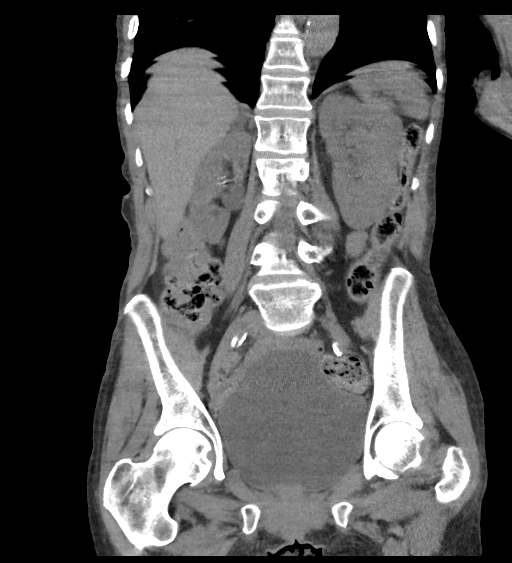

[17 of 46 positions shown; findings below may reference images not displayed]

FINDINGS: Motion and streak artifact are present. Findings below are within
this limitation.

Lower chest: No acute abnormality.

Hepatobiliary: No focal liver abnormality is seen. No gallstones,
gallbladder wall thickening, or biliary dilatation.

Pancreas: Unremarkable.

Spleen: Unremarkable.

Adrenals/Urinary Tract: Small bilateral nonobstructing renal calculi
as before. Largest measures 4 mm on the right. Extrarenal pelvis on
the left. No hydronephrosis. Bladder is distended.

Stomach/Bowel: Stomach is unremarkable. Bowel is normal in caliber.
Stool is present throughout the colon.

Vascular/Lymphatic: Aortic atherosclerosis. No enlarged lymph nodes
are identified.

Reproductive: Calcified uterine fibroids.

Other: No free fluid.  No acute abnormality of the abdominal wall.

Musculoskeletal: No acute osseous abnormality. Right femoral head
avascular necrosis.
IMPRESSION: Distended bladder.

Nonobstructing small renal calculi.

Calcified uterine fibroids.

Aortic atherosclerosis.

Right femoral head AVN.
